# Patient Record
Sex: Female | Born: 1945
Health system: Southern US, Community
[De-identification: ages and names within clinical notes are randomized; demographics above are authoritative.]

## PROBLEM LIST (undated history)

## (undated) DIAGNOSIS — N390 Urinary tract infection, site not specified: Secondary | ICD-10-CM

## (undated) DIAGNOSIS — Z8709 Personal history of other diseases of the respiratory system: Secondary | ICD-10-CM

## (undated) DIAGNOSIS — E785 Hyperlipidemia, unspecified: Secondary | ICD-10-CM

## (undated) DIAGNOSIS — B9689 Other specified bacterial agents as the cause of diseases classified elsewhere: Secondary | ICD-10-CM

## (undated) DIAGNOSIS — Z8669 Personal history of other diseases of the nervous system and sense organs: Secondary | ICD-10-CM

## (undated) DIAGNOSIS — E079 Disorder of thyroid, unspecified: Secondary | ICD-10-CM

## (undated) DIAGNOSIS — K219 Gastro-esophageal reflux disease without esophagitis: Secondary | ICD-10-CM

## (undated) DIAGNOSIS — F0390 Unspecified dementia without behavioral disturbance: Secondary | ICD-10-CM

## (undated) DIAGNOSIS — M545 Low back pain, unspecified: Secondary | ICD-10-CM

## (undated) DIAGNOSIS — K589 Irritable bowel syndrome without diarrhea: Secondary | ICD-10-CM

## (undated) DIAGNOSIS — T7840XA Allergy, unspecified, initial encounter: Secondary | ICD-10-CM

## (undated) DIAGNOSIS — Z87898 Personal history of other specified conditions: Secondary | ICD-10-CM

## (undated) DIAGNOSIS — I10 Essential (primary) hypertension: Secondary | ICD-10-CM

## (undated) DIAGNOSIS — D126 Benign neoplasm of colon, unspecified: Secondary | ICD-10-CM

## (undated) HISTORY — DX: Personal history of other diseases of the nervous system and sense organs: Z86.69

## (undated) HISTORY — PX: GUM SURGERY: SHX658

## (undated) HISTORY — DX: Low back pain: M54.5

## (undated) HISTORY — DX: Gastro-esophageal reflux disease without esophagitis: K21.9

## (undated) HISTORY — DX: Essential (primary) hypertension: I10

## (undated) HISTORY — DX: Hyperlipidemia, unspecified: E78.5

## (undated) HISTORY — DX: Disorder of thyroid, unspecified: E07.9

## (undated) HISTORY — DX: Allergy, unspecified, initial encounter: T78.40XA

## (undated) HISTORY — DX: Unspecified dementia, unspecified severity, without behavioral disturbance, psychotic disturbance, mood disturbance, and anxiety: F03.90

## (undated) HISTORY — DX: Irritable bowel syndrome without diarrhea: K58.9

## (undated) HISTORY — DX: Benign neoplasm of colon, unspecified: D12.6

## (undated) HISTORY — DX: Low back pain, unspecified: M54.50

---

## 1975-09-09 HISTORY — PX: TUBAL LIGATION: SHX77

## 1996-09-08 HISTORY — PX: CHOLECYSTECTOMY: SHX55

## 1996-09-08 HISTORY — PX: ABDOMINAL HYSTERECTOMY: SHX81

## 1999-02-11 ENCOUNTER — Other Ambulatory Visit: Admission: RE | Admit: 1999-02-11 | Discharge: 1999-02-11 | Payer: Self-pay | Admitting: Gynecology

## 2002-07-12 ENCOUNTER — Other Ambulatory Visit: Admission: RE | Admit: 2002-07-12 | Discharge: 2002-07-12 | Payer: Self-pay | Admitting: Gynecology

## 2005-04-24 ENCOUNTER — Ambulatory Visit: Payer: Self-pay | Admitting: Family Medicine

## 2005-04-24 ENCOUNTER — Encounter: Admission: RE | Admit: 2005-04-24 | Discharge: 2005-04-24 | Payer: Self-pay | Admitting: Family Medicine

## 2005-07-02 ENCOUNTER — Ambulatory Visit: Payer: Self-pay | Admitting: Family Medicine

## 2005-07-21 ENCOUNTER — Ambulatory Visit: Payer: Self-pay | Admitting: Family Medicine

## 2005-08-28 ENCOUNTER — Other Ambulatory Visit: Admission: RE | Admit: 2005-08-28 | Discharge: 2005-08-28 | Payer: Self-pay | Admitting: Gynecology

## 2005-12-03 ENCOUNTER — Ambulatory Visit: Payer: Self-pay | Admitting: Family Medicine

## 2007-03-17 ENCOUNTER — Encounter: Payer: Self-pay | Admitting: Family Medicine

## 2007-04-07 ENCOUNTER — Ambulatory Visit: Payer: Self-pay | Admitting: Family Medicine

## 2007-04-07 DIAGNOSIS — E039 Hypothyroidism, unspecified: Secondary | ICD-10-CM | POA: Insufficient documentation

## 2007-04-07 DIAGNOSIS — K219 Gastro-esophageal reflux disease without esophagitis: Secondary | ICD-10-CM | POA: Insufficient documentation

## 2007-04-07 DIAGNOSIS — E785 Hyperlipidemia, unspecified: Secondary | ICD-10-CM

## 2007-04-07 DIAGNOSIS — K589 Irritable bowel syndrome without diarrhea: Secondary | ICD-10-CM | POA: Insufficient documentation

## 2007-04-07 DIAGNOSIS — E7800 Pure hypercholesterolemia, unspecified: Secondary | ICD-10-CM | POA: Insufficient documentation

## 2007-05-05 ENCOUNTER — Ambulatory Visit: Payer: Self-pay | Admitting: Internal Medicine

## 2007-05-05 LAB — CONVERTED CEMR LAB: Tissue Transglutaminase Ab, IgA: 0.1 units (ref <7)

## 2007-05-20 ENCOUNTER — Encounter: Payer: Self-pay | Admitting: Internal Medicine

## 2007-05-20 ENCOUNTER — Ambulatory Visit: Payer: Self-pay | Admitting: Internal Medicine

## 2007-05-20 ENCOUNTER — Encounter: Payer: Self-pay | Admitting: Family Medicine

## 2007-05-20 DIAGNOSIS — D126 Benign neoplasm of colon, unspecified: Secondary | ICD-10-CM | POA: Insufficient documentation

## 2007-05-20 DIAGNOSIS — K5730 Diverticulosis of large intestine without perforation or abscess without bleeding: Secondary | ICD-10-CM | POA: Insufficient documentation

## 2007-05-26 LAB — HM COLONOSCOPY

## 2007-06-11 ENCOUNTER — Ambulatory Visit: Payer: Self-pay | Admitting: Internal Medicine

## 2007-07-14 ENCOUNTER — Ambulatory Visit: Payer: Self-pay | Admitting: Family Medicine

## 2007-07-26 ENCOUNTER — Ambulatory Visit: Payer: Self-pay | Admitting: Family Medicine

## 2007-07-26 DIAGNOSIS — R059 Cough, unspecified: Secondary | ICD-10-CM | POA: Insufficient documentation

## 2007-07-26 DIAGNOSIS — R05 Cough: Secondary | ICD-10-CM

## 2007-07-28 ENCOUNTER — Ambulatory Visit: Payer: Self-pay | Admitting: Family Medicine

## 2007-07-30 ENCOUNTER — Encounter (INDEPENDENT_AMBULATORY_CARE_PROVIDER_SITE_OTHER): Payer: Self-pay | Admitting: *Deleted

## 2007-08-10 ENCOUNTER — Encounter: Payer: Self-pay | Admitting: Family Medicine

## 2007-08-10 ENCOUNTER — Ambulatory Visit: Payer: Self-pay | Admitting: Internal Medicine

## 2007-08-16 ENCOUNTER — Encounter (INDEPENDENT_AMBULATORY_CARE_PROVIDER_SITE_OTHER): Payer: Self-pay | Admitting: *Deleted

## 2007-11-24 ENCOUNTER — Ambulatory Visit: Payer: Self-pay | Admitting: Internal Medicine

## 2008-01-25 ENCOUNTER — Ambulatory Visit: Payer: Self-pay | Admitting: Family Medicine

## 2008-01-25 ENCOUNTER — Telehealth (INDEPENDENT_AMBULATORY_CARE_PROVIDER_SITE_OTHER): Payer: Self-pay | Admitting: *Deleted

## 2008-02-04 ENCOUNTER — Encounter (INDEPENDENT_AMBULATORY_CARE_PROVIDER_SITE_OTHER): Payer: Self-pay | Admitting: *Deleted

## 2008-02-10 ENCOUNTER — Encounter (INDEPENDENT_AMBULATORY_CARE_PROVIDER_SITE_OTHER): Payer: Self-pay | Admitting: *Deleted

## 2008-02-29 ENCOUNTER — Encounter (INDEPENDENT_AMBULATORY_CARE_PROVIDER_SITE_OTHER): Payer: Self-pay | Admitting: *Deleted

## 2008-02-29 ENCOUNTER — Ambulatory Visit: Payer: Self-pay | Admitting: Family Medicine

## 2008-02-29 DIAGNOSIS — IMO0001 Reserved for inherently not codable concepts without codable children: Secondary | ICD-10-CM | POA: Insufficient documentation

## 2008-02-29 LAB — CONVERTED CEMR LAB: Total CK: 129 units/L (ref 7–177)

## 2008-03-24 ENCOUNTER — Ambulatory Visit: Payer: Self-pay | Admitting: Family Medicine

## 2008-03-24 DIAGNOSIS — J0110 Acute frontal sinusitis, unspecified: Secondary | ICD-10-CM | POA: Insufficient documentation

## 2008-03-27 ENCOUNTER — Encounter (INDEPENDENT_AMBULATORY_CARE_PROVIDER_SITE_OTHER): Payer: Self-pay | Admitting: *Deleted

## 2008-04-18 ENCOUNTER — Encounter: Payer: Self-pay | Admitting: Family Medicine

## 2008-04-18 ENCOUNTER — Ambulatory Visit: Payer: Self-pay | Admitting: Internal Medicine

## 2008-04-19 ENCOUNTER — Telehealth (INDEPENDENT_AMBULATORY_CARE_PROVIDER_SITE_OTHER): Payer: Self-pay | Admitting: *Deleted

## 2008-06-30 ENCOUNTER — Ambulatory Visit: Payer: Self-pay | Admitting: Family Medicine

## 2008-07-24 ENCOUNTER — Ambulatory Visit: Payer: Self-pay | Admitting: Family Medicine

## 2008-07-24 DIAGNOSIS — H409 Unspecified glaucoma: Secondary | ICD-10-CM | POA: Insufficient documentation

## 2008-07-24 DIAGNOSIS — I10 Essential (primary) hypertension: Secondary | ICD-10-CM | POA: Insufficient documentation

## 2008-07-25 ENCOUNTER — Encounter (INDEPENDENT_AMBULATORY_CARE_PROVIDER_SITE_OTHER): Payer: Self-pay | Admitting: *Deleted

## 2008-08-04 ENCOUNTER — Encounter (INDEPENDENT_AMBULATORY_CARE_PROVIDER_SITE_OTHER): Payer: Self-pay | Admitting: *Deleted

## 2008-08-04 ENCOUNTER — Ambulatory Visit: Payer: Self-pay | Admitting: Family Medicine

## 2008-08-04 LAB — CONVERTED CEMR LAB: OCCULT 1: NEGATIVE

## 2008-08-14 ENCOUNTER — Ambulatory Visit: Payer: Self-pay | Admitting: Family Medicine

## 2008-08-14 DIAGNOSIS — B351 Tinea unguium: Secondary | ICD-10-CM | POA: Insufficient documentation

## 2008-08-18 ENCOUNTER — Encounter (INDEPENDENT_AMBULATORY_CARE_PROVIDER_SITE_OTHER): Payer: Self-pay | Admitting: *Deleted

## 2008-09-13 ENCOUNTER — Encounter: Payer: Self-pay | Admitting: Family Medicine

## 2008-09-18 ENCOUNTER — Ambulatory Visit: Payer: Self-pay | Admitting: Internal Medicine

## 2008-09-18 DIAGNOSIS — R141 Gas pain: Secondary | ICD-10-CM | POA: Insufficient documentation

## 2008-09-18 DIAGNOSIS — R143 Flatulence: Secondary | ICD-10-CM

## 2008-09-18 DIAGNOSIS — R142 Eructation: Secondary | ICD-10-CM

## 2008-10-02 ENCOUNTER — Encounter: Payer: Self-pay | Admitting: Internal Medicine

## 2008-10-02 ENCOUNTER — Ambulatory Visit: Payer: Self-pay | Admitting: Internal Medicine

## 2008-10-03 ENCOUNTER — Encounter: Payer: Self-pay | Admitting: Internal Medicine

## 2008-11-14 ENCOUNTER — Ambulatory Visit: Payer: Self-pay | Admitting: Family Medicine

## 2008-11-28 ENCOUNTER — Ambulatory Visit: Payer: Self-pay | Admitting: Family Medicine

## 2008-12-04 ENCOUNTER — Ambulatory Visit: Payer: Self-pay | Admitting: Pulmonary Disease

## 2008-12-20 ENCOUNTER — Ambulatory Visit: Payer: Self-pay | Admitting: Pulmonary Disease

## 2009-01-17 ENCOUNTER — Ambulatory Visit: Payer: Self-pay | Admitting: Pulmonary Disease

## 2009-01-17 DIAGNOSIS — J019 Acute sinusitis, unspecified: Secondary | ICD-10-CM | POA: Insufficient documentation

## 2009-01-23 ENCOUNTER — Ambulatory Visit: Payer: Self-pay | Admitting: Family Medicine

## 2009-01-30 ENCOUNTER — Encounter (INDEPENDENT_AMBULATORY_CARE_PROVIDER_SITE_OTHER): Payer: Self-pay | Admitting: *Deleted

## 2009-01-30 LAB — CONVERTED CEMR LAB
ALT: 19 units/L (ref 0–35)
AST: 18 units/L (ref 0–37)
Albumin: 3.7 g/dL (ref 3.5–5.2)
BUN: 14 mg/dL (ref 6–23)
Chloride: 106 meq/L (ref 96–112)
Cholesterol: 172 mg/dL (ref 0–200)
Glucose, Bld: 82 mg/dL (ref 70–99)
Potassium: 4.7 meq/L (ref 3.5–5.1)

## 2009-02-02 ENCOUNTER — Telehealth (INDEPENDENT_AMBULATORY_CARE_PROVIDER_SITE_OTHER): Payer: Self-pay | Admitting: *Deleted

## 2009-02-20 ENCOUNTER — Ambulatory Visit: Payer: Self-pay | Admitting: Family Medicine

## 2009-03-02 ENCOUNTER — Ambulatory Visit: Payer: Self-pay | Admitting: Pulmonary Disease

## 2009-03-19 ENCOUNTER — Telehealth (INDEPENDENT_AMBULATORY_CARE_PROVIDER_SITE_OTHER): Payer: Self-pay | Admitting: *Deleted

## 2009-07-26 ENCOUNTER — Ambulatory Visit: Payer: Self-pay | Admitting: Family Medicine

## 2009-07-26 DIAGNOSIS — Z780 Asymptomatic menopausal state: Secondary | ICD-10-CM | POA: Insufficient documentation

## 2009-08-01 ENCOUNTER — Encounter (INDEPENDENT_AMBULATORY_CARE_PROVIDER_SITE_OTHER): Payer: Self-pay | Admitting: *Deleted

## 2009-08-13 ENCOUNTER — Encounter: Payer: Self-pay | Admitting: Family Medicine

## 2009-08-13 ENCOUNTER — Ambulatory Visit: Payer: Self-pay | Admitting: Internal Medicine

## 2009-08-22 ENCOUNTER — Ambulatory Visit: Payer: Self-pay | Admitting: Family Medicine

## 2009-08-24 ENCOUNTER — Encounter (INDEPENDENT_AMBULATORY_CARE_PROVIDER_SITE_OTHER): Payer: Self-pay | Admitting: *Deleted

## 2009-08-24 LAB — CONVERTED CEMR LAB
OCCULT 1: NEGATIVE
OCCULT 2: NEGATIVE
OCCULT 3: NEGATIVE

## 2009-09-03 ENCOUNTER — Encounter (INDEPENDENT_AMBULATORY_CARE_PROVIDER_SITE_OTHER): Payer: Self-pay | Admitting: *Deleted

## 2009-11-22 ENCOUNTER — Telehealth: Payer: Self-pay | Admitting: Family Medicine

## 2010-01-21 ENCOUNTER — Telehealth (INDEPENDENT_AMBULATORY_CARE_PROVIDER_SITE_OTHER): Payer: Self-pay | Admitting: *Deleted

## 2010-01-28 ENCOUNTER — Ambulatory Visit: Payer: Self-pay | Admitting: Family Medicine

## 2010-01-28 LAB — CONVERTED CEMR LAB
ALT: 20 units/L (ref 0–35)
AST: 22 units/L (ref 0–37)
Alkaline Phosphatase: 70 units/L (ref 39–117)
Bilirubin, Direct: 0.1 mg/dL (ref 0.0–0.3)
Cholesterol: 159 mg/dL (ref 0–200)
Total Bilirubin: 0.7 mg/dL (ref 0.3–1.2)
Total Protein: 7.3 g/dL (ref 6.0–8.3)

## 2010-02-20 LAB — HM PAP SMEAR: HM Pap smear: NORMAL

## 2010-02-20 LAB — HM MAMMOGRAPHY: HM Mammogram: NORMAL

## 2010-09-12 ENCOUNTER — Encounter: Payer: Self-pay | Admitting: Family Medicine

## 2010-09-12 ENCOUNTER — Ambulatory Visit
Admission: RE | Admit: 2010-09-12 | Discharge: 2010-09-12 | Payer: Self-pay | Source: Home / Self Care | Attending: Family Medicine | Admitting: Family Medicine

## 2010-09-12 ENCOUNTER — Other Ambulatory Visit: Payer: Self-pay | Admitting: Family Medicine

## 2010-09-12 LAB — CBC WITH DIFFERENTIAL/PLATELET
Basophils Absolute: 0 10*3/uL (ref 0.0–0.1)
Basophils Relative: 0.8 % (ref 0.0–3.0)
Eosinophils Absolute: 0.2 10*3/uL (ref 0.0–0.7)
Eosinophils Relative: 2.5 % (ref 0.0–5.0)
HCT: 36.9 % (ref 36.0–46.0)
Hemoglobin: 12.4 g/dL (ref 12.0–15.0)
Lymphocytes Relative: 27.3 % (ref 12.0–46.0)
Lymphs Abs: 1.7 10*3/uL (ref 0.7–4.0)
MCHC: 33.6 g/dL (ref 30.0–36.0)
MCV: 87 fl (ref 78.0–100.0)
Monocytes Absolute: 0.5 10*3/uL (ref 0.1–1.0)
Monocytes Relative: 8.4 % (ref 3.0–12.0)
Neutro Abs: 3.8 10*3/uL (ref 1.4–7.7)
Neutrophils Relative %: 61 % (ref 43.0–77.0)
Platelets: 257 10*3/uL (ref 150.0–400.0)
RBC: 4.24 Mil/uL (ref 3.87–5.11)
RDW: 12.6 % (ref 11.5–14.6)
WBC: 6.3 10*3/uL (ref 4.5–10.5)

## 2010-09-12 LAB — CONVERTED CEMR LAB
Ketones, urine, test strip: NEGATIVE
Nitrite: NEGATIVE
Specific Gravity, Urine: <1.005
Urobilinogen, UA: NEGATIVE
WBC Urine, dipstick: NEGATIVE

## 2010-09-12 LAB — HEPATIC FUNCTION PANEL
ALT: 21 U/L (ref 0–35)
AST: 28 U/L (ref 0–37)
Albumin: 4.1 g/dL (ref 3.5–5.2)
Alkaline Phosphatase: 70 U/L (ref 39–117)
Bilirubin, Direct: 0.1 mg/dL (ref 0.0–0.3)
Total Bilirubin: 0.5 mg/dL (ref 0.3–1.2)
Total Protein: 6.9 g/dL (ref 6.0–8.3)

## 2010-09-12 LAB — BASIC METABOLIC PANEL
BUN: 13 mg/dL (ref 6–23)
CO2: 31 mEq/L (ref 19–32)
Calcium: 9.3 mg/dL (ref 8.4–10.5)
Chloride: 105 mEq/L (ref 96–112)
Creatinine, Ser: 0.7 mg/dL (ref 0.4–1.2)
GFR: 97.36 mL/min (ref >60.00)
Glucose, Bld: 91 mg/dL (ref 70–99)
Potassium: 5.2 mEq/L — ABNORMAL HIGH (ref 3.5–5.1)
Sodium: 143 mEq/L (ref 135–145)

## 2010-09-12 LAB — TSH: TSH: 2.5 u[IU]/mL (ref 0.35–5.50)

## 2010-09-12 LAB — LIPID PANEL
Cholesterol: 155 mg/dL (ref 0–200)
HDL: 58.3 mg/dL (ref >39.00)
LDL Cholesterol: 82 mg/dL (ref 0–99)
Total CHOL/HDL Ratio: 3
Triglycerides: 76 mg/dL (ref 0.0–149.0)
VLDL: 15.2 mg/dL (ref 0.0–40.0)

## 2010-10-06 LAB — CONVERTED CEMR LAB
ALT: 15 units/L (ref 0–35)
ALT: 18 units/L (ref 0–35)
ALT: 20 units/L (ref 0–35)
ALT: 21 units/L (ref 0–35)
AST: 19 units/L (ref 0–37)
AST: 22 units/L (ref 0–37)
AST: 22 units/L (ref 0–37)
Albumin: 3.9 g/dL (ref 3.5–5.2)
Albumin: 3.9 g/dL (ref 3.5–5.2)
Albumin: 3.9 g/dL (ref 3.5–5.2)
Albumin: 4.2 g/dL (ref 3.5–5.2)
Alkaline Phosphatase: 62 units/L (ref 39–117)
Alkaline Phosphatase: 62 units/L (ref 39–117)
Alkaline Phosphatase: 65 units/L (ref 39–117)
Alkaline Phosphatase: 83 units/L (ref 39–117)
Alkaline Phosphatase: 95 units/L (ref 39–117)
BUN: 11 mg/dL (ref 6–23)
BUN: 13 mg/dL (ref 6–23)
Basophils Absolute: 0 10*3/uL (ref 0.0–0.1)
Basophils Absolute: 0.1 10*3/uL (ref 0.0–0.1)
Basophils Relative: 0.8 % (ref 0.0–1.0)
Bilirubin Urine: NEGATIVE
Bilirubin, Direct: 0.1 mg/dL (ref 0.0–0.3)
Bilirubin, Direct: 0.1 mg/dL (ref 0.0–0.3)
Blood in Urine, dipstick: NEGATIVE
CO2: 29 meq/L (ref 19–32)
CO2: 30 meq/L (ref 19–32)
CO2: 31 meq/L (ref 19–32)
Calcium: 8.9 mg/dL (ref 8.4–10.5)
Calcium: 9.3 mg/dL (ref 8.4–10.5)
Calcium: 9.5 mg/dL (ref 8.4–10.5)
Chloride: 104 meq/L (ref 96–112)
Creatinine, Ser: 0.8 mg/dL (ref 0.4–1.2)
Eosinophils Absolute: 0.2 10*3/uL (ref 0.0–0.7)
Eosinophils Absolute: 0.3 10*3/uL (ref 0.0–0.6)
Eosinophils Relative: 3.4 % (ref 0.0–5.0)
Eosinophils Relative: 3.9 % (ref 0.0–5.0)
GFR calc Af Amer: 94 mL/min
GFR calc non Af Amer: 78 mL/min
Glucose, Bld: 81 mg/dL (ref 70–99)
Glucose, Bld: 82 mg/dL (ref 70–99)
Glucose, Bld: 95 mg/dL (ref 70–99)
Glucose, Urine, Semiquant: NEGATIVE
HCT: 36.9 % (ref 36.0–46.0)
HCT: 37.1 % (ref 36.0–46.0)
HDL: 60.4 mg/dL (ref >39.00)
Hemoglobin: 12.5 g/dL (ref 12.0–15.0)
Hemoglobin: 12.6 g/dL (ref 12.0–15.0)
Hemoglobin: 12.9 g/dL (ref 12.0–15.0)
Ketones, urine, test strip: NEGATIVE
LDL Cholesterol: 79 mg/dL (ref 0–99)
Lymphocytes Relative: 23.3 % (ref 12.0–46.0)
Lymphocytes Relative: 23.9 % (ref 12.0–46.0)
Lymphocytes Relative: 23.9 % (ref 12.0–46.0)
Lymphs Abs: 1.6 10*3/uL (ref 0.7–4.0)
MCHC: 34.3 g/dL (ref 30.0–36.0)
MCHC: 34.7 g/dL (ref 30.0–36.0)
MCV: 83.1 fL (ref 78.0–100.0)
Monocytes Absolute: 0.5 10*3/uL (ref 0.1–1.0)
Monocytes Absolute: 0.6 10*3/uL (ref 0.2–0.7)
Monocytes Relative: 7.9 % (ref 3.0–11.0)
Monocytes Relative: 9 % (ref 3.0–12.0)
Neutro Abs: 4.3 10*3/uL (ref 1.4–7.7)
Neutro Abs: 4.4 10*3/uL (ref 1.4–7.7)
Neutro Abs: 5.1 10*3/uL (ref 1.4–7.7)
Neutrophils Relative %: 64.1 % (ref 43.0–77.0)
Pap Smear: NORMAL
Platelets: 307 10*3/uL (ref 150–400)
Potassium: 4.7 meq/L (ref 3.5–5.1)
Protein, U semiquant: 30
RBC: 4.34 M/uL (ref 3.87–5.11)
RBC: 4.46 M/uL (ref 3.87–5.11)
RDW: 11.7 % (ref 11.5–14.6)
RDW: 12 % (ref 11.5–14.6)
RDW: 12.5 % (ref 11.5–14.6)
Sodium: 140 meq/L (ref 135–145)
Sodium: 141 meq/L (ref 135–145)
Sodium: 145 meq/L (ref 135–145)
TSH: 2.71 microintl units/mL (ref 0.35–5.50)
TSH: 3.31 microintl units/mL (ref 0.35–5.50)
Total Bilirubin: 0.8 mg/dL (ref 0.3–1.2)
Total CHOL/HDL Ratio: 2.9
Total Protein: 6.9 g/dL (ref 6.0–8.3)
Total Protein: 7.1 g/dL (ref 6.0–8.3)
Total Protein: 7.3 g/dL (ref 6.0–8.3)
Triglycerides: 69 mg/dL (ref 0–149)
Triglycerides: 82 mg/dL (ref 0.0–149.0)
WBC: 7.1 10*3/uL (ref 4.5–10.5)
WBC: 8 10*3/uL (ref 4.5–10.5)
pH: 7.5

## 2010-10-10 NOTE — Progress Notes (Signed)
Summary: refill  Phone Note Refill Request Message from:  Fax from Pharmacy on Jan 21, 2010 8:44 AM  Refills Requested: Medication #1:  TUSSICAPS 10-8 MG XR12H-CAP one every 12hrs if needed [facility] [address] - fax [phone] - ph [phone]    Method Requested: Fax to Local Pharmacy Next Appointment Scheduled: None Initial call taken by: Okey Regal [place],  Jan 21, 2010 8:48 AM  Follow-up for Phone Call        Pt is aware we do not refill unless needed, she states she does not need at the moment, she was using when she goes out of town incase she starts coughing. Army Fossa CMA  Jan 21, 2010 8:57 AM

## 2010-10-10 NOTE — Assessment & Plan Note (Signed)
Summary: CPX AND FASTING LABS//PH   Vital Signs:  Patient profile:   65 year old female Height:      62.75 inches Weight:      162.0 pounds BMI:     29.03 Pulse rate:   72 / minute Pulse rhythm:   regular BP sitting:   116 / 70  (right arm) Cuff size:   regular  Vitals Entered By: Almeta Monas CMA Duncan Dull) (September 12, 2010 8:35 AM) CC: CPX/Fasting--no pap. wants a refill on the Tussicaps   History of Present Illness: Pt here for cpe , no pap and labs.   Dr Laural Roes --gyn Dr Davina Poke Dr [place]--GI Dr Oliver Hum Pt would like refills on cough meds to do cough protochol again---she will f/u Dr clance if there is no improvement.  Preventive Screening-Counseling & Management  Alcohol-Tobacco     Alcohol drinks/day: 0     Smoking Status: never     Passive Smoke Exposure: no  Caffeine-Diet-Exercise     Caffeine use/day: 3     Does Patient Exercise: no     Exercise Counseling: to improve exercise regimen  Hep-HIV-STD-Contraception     HIV Risk: no     Dental Visit-last 6 months yes     Dental Care Counseling: not indicated; dental care within six months     SBE monthly: yes     SBE Education/Counseling: not indicated; SBE done regularly     Sun Exposure-Excessive: occasionally  Safety-Violence-Falls     Seat Belt Use: yes      Sexual History:  currently monogamous and married.        Drug Use:  never.    Problems Prior to Update: 1)  Postmenopausal Status  (ICD-V49.81) 2)  Preventive Health Care  (ICD-V70.0) 3)  Sinusitis, Acute  (ICD-461.9) 4)  Cough  (ICD-786.2) 5)  Intestinal Gas  (ICD-787.3) 6)  Onychomycosis, Toenails  (ICD-110.1) 7)  Glaucoma  (ICD-365.9) 8)  Hypertension  (ICD-401.9) 9)  Acute Frontal Sinusitis  (ICD-461.1) 10)  Myalgia  (ICD-729.1) 11)  Diverticulosis, Colon  (ICD-562.10) 12)  Adenomatous Colonic Polyp  (ICD-211.3) 13)  Cough  (ICD-786.2) 14)  Ibs  (ICD-564.1) 15)  Preventive Health Care  (ICD-V70.0) 16)  Hypothyroidism   (ICD-244.9) 17)  Hyperlipidemia  (ICD-272.4) 18)  Gerd  (ICD-530.81)  Medications Prior to Update: 1)  Nexium 40 Mg  Cpdr (Esomeprazole Magnesium) .Marland Kitchen.. 1 By Mouth Once Daily 2)  Synthroid 50 Mcg  Tabs (Levothyroxine Sodium) .Marland Kitchen.. 1 Once Daily 3)  Premarin 0.45 Mg  Tabs (Estrogens Conjugated) .Marland Kitchen.. 1 Once Daily 4)  Adult Aspirin Low Strength 81 Mg  Tbdp (Aspirin) .Marland Kitchen.. 1 Once Daily 5)  Calcium 600mg  .... Two Times A Day 6)  Mvi .Marland Kitchen.. 1 Once Daily 7)  Align   Caps (Misc Intestinal Flora Regulat) .Marland Kitchen.. 1 By Mouth Once Daily 8)  Pravachol 40 Mg  Tabs (Pravastatin Sodium) .Marland Kitchen.. 1 By Mouth Once Daily** Labs Due Now** 9)  Mobic 15 Mg Tabs (Meloxicam) .... Take 1 Tab Once Daily Prn 10)  Norvasc 5 Mg Tabs (Amlodipine Besylate) .Marland Kitchen.. 1 By Mouth Once Daily 11)  Zyrtec Allergy 10 Mg Tabs (Cetirizine Hcl) .Marland Kitchen.. 1 Once Daily As Needed 12)  Tussicaps 10-8 Mg Xr12h-Cap (Hydrocod Polst-Chlorphen Polst) .... One Every 12hrs If Needed 13)  Tessalon Perles 100 Mg  Caps (Benzonatate) .... Two By Mouth 3-4 Times Daily If Needed.  Current Medications (verified): 1)  Nexium 40 Mg  Cpdr (Esomeprazole Magnesium) .Marland Kitchen.. 1 By Mouth Once Daily  2)  Synthroid 50 Mcg  Tabs (Levothyroxine Sodium) .Marland Kitchen.. 1 Once Daily 3)  Adult Aspirin Low Strength 81 Mg  Tbdp (Aspirin) .Marland Kitchen.. 1 Once Daily 4)  Calcium 600mg  .... Two Times A Day 5)  Mvi .Marland Kitchen.. 1 Once Daily 6)  Align   Caps (Misc Intestinal Flora Regulat) .Marland Kitchen.. 1 By Mouth Once Daily 7)  Pravachol 40 Mg  Tabs (Pravastatin Sodium) .Marland Kitchen.. 1 By Mouth At Bedtime 8)  Mobic 15 Mg Tabs (Meloxicam) .... Take 1 Tab Once Daily Prn 9)  Norvasc 5 Mg Tabs (Amlodipine Besylate) .Marland Kitchen.. 1 By Mouth Once Daily 10)  Zyrtec Allergy 10 Mg Tabs (Cetirizine Hcl) .Marland Kitchen.. 1 Once Daily As Needed 11)  Tussicaps 10-8 Mg Xr12h-Cap (Hydrocod Polst-Chlorphen Polst) .... One Every 12hrs If Needed 12)  Tessalon Perles 100 Mg Caps (Benzonatate) .... 2 By Mouth 3-4 X A Day As Needed Cough  Allergies (verified): No Known Drug  Allergies  Past History:  Past Medical History: Last updated: 07/24/2008 GERD Hyperlipidemia Hypothyroidism IBS Adenomatous colon polyp Incidental diverticulosis Hypertension Current Problems:  GLAUCOMA (ICD-365.9) HYPERTENSION (ICD-401.9) ACUTE FRONTAL SINUSITIS (ICD-461.1) MYALGIA (ICD-729.1) DIVERTICULOSIS, COLON (ICD-562.10) ADENOMATOUS COLONIC POLYP (ICD-211.3) COUGH (ICD-786.2) IBS (ICD-564.1) PREVENTIVE HEALTH CARE (ICD-V70.0) HYPOTHYROIDISM (ICD-244.9) HYPERLIPIDEMIA (ICD-272.4) GERD (ICD-530.81)  Past Surgical History: Last updated: 07/24/2008 Tubal ligation (1610) Cholecystectomy (1998) Hysterectomy (1998) gum surgery 1996 2 childbirths  Family History: Last updated: 12/04/2008 Family History Hypertension F- mac degen m- glaucoma, ? emphysema Family History of Prostate CA 1st degree relative <50- father  1 b-- brain tumor  Social History: Last updated: 12/04/2008 retired. previously worked in Physiological scientist.   Married with children. Never Smoked Alcohol use-no Drug use-no Regular exercise-no  Risk Factors: Alcohol Use: 0 (09/12/2010) Caffeine Use: 3 (09/12/2010) Exercise: no (09/12/2010)  Risk Factors: Smoking Status: never (09/12/2010) Passive Smoke Exposure: no (09/12/2010)  Family History: Reviewed history from 12/04/2008 and no changes required. Family History Hypertension F- mac degen m- glaucoma, ? emphysema Family History of Prostate CA 1st degree relative <50- father  1 b-- brain tumor  Social History: Reviewed history from 12/04/2008 and no changes required. retired. previously worked in Physiological scientist.   Married with children. Never Smoked Alcohol use-no Drug use-no Regular exercise-no  Caffeine use/day:  3  Review of Systems      See HPI General:  Denies chills, fatigue, fever, loss of appetite, malaise, sleep disorder, sweats, weakness, and weight loss. Eyes:  Denies blurring, discharge, double vision, eye irritation, eye  pain, halos, itching, light sensitivity, red eye, vision loss-1 eye, and vision loss-both eyes. ENT:  Denies decreased hearing, difficulty swallowing, ear discharge, earache, hoarseness, nasal congestion, nosebleeds, postnasal drainage, ringing in ears, sinus pressure, and sore throat. CV:  Denies bluish discoloration of lips or nails, chest pain or discomfort, difficulty breathing at night, difficulty breathing while lying down, fainting, fatigue, leg cramps with exertion, lightheadness, near fainting, palpitations, shortness of breath with exertion, swelling of feet, swelling of hands, and weight gain. Resp:  Denies chest discomfort, chest pain with inspiration, cough, coughing up blood, excessive snoring, hypersomnolence, morning headaches, pleuritic, shortness of breath, sputum productive, and wheezing. GI:  Denies abdominal pain, bloody stools, change in bowel habits, constipation, dark tarry stools, diarrhea, excessive appetite, gas, hemorrhoids, indigestion, loss of appetite, nausea, vomiting, vomiting blood, and yellowish skin color. GU:  Denies abnormal vaginal bleeding, decreased libido, discharge, dysuria, genital sores, hematuria, incontinence, nocturia, urinary frequency, and urinary hesitancy. MS:  Denies joint pain, joint redness, joint swelling, loss of strength, low back pain,  mid back pain, muscle aches, muscle , cramps, muscle weakness, stiffness, and thoracic pain. Derm:  Denies changes in color of skin, changes in nail beds, dryness, excessive perspiration, flushing, hair loss, insect bite(s), itching, lesion(s), poor wound healing, and rash. Neuro:  Denies brief paralysis, difficulty with concentration, disturbances in coordination, falling down, headaches, inability to speak, memory loss, numbness, poor balance, seizures, sensation of room spinning, tingling, tremors, visual disturbances, and weakness. Psych:  Denies alternate hallucination ( auditory/visual), anxiety, depression,  easily angered, easily tearful, irritability, mental problems, panic attacks, sense of great danger, suicidal thoughts/plans, thoughts of violence, unusual visions or sounds, and thoughts /plans of harming others. Endo:  Denies cold intolerance, excessive hunger, excessive thirst, excessive urination, heat intolerance, polyuria, and weight change. Heme:  Denies abnormal bruising, bleeding, enlarge lymph nodes, fevers, pallor, and skin discoloration. Allergy:  Denies hives or rash, itching eyes, persistent infections, seasonal allergies, and sneezing.  Physical Exam  General:  Well-developed,well-nourished,in no acute distress; alert,appropriate and cooperative throughout examination Head:  Normocephalic and atraumatic without obvious abnormalities. No apparent alopecia or balding. Eyes:  pupils equal, pupils round, pupils reactive to light, and no injection.   Ears:  External ear exam shows no significant lesions or deformities.  Otoscopic examination reveals clear canals, tympanic membranes are intact bilaterally without bulging, retraction, inflammation or discharge. Hearing is grossly normal bilaterally. Nose:  External nasal examination shows no deformity or inflammation. Nasal mucosa are pink and moist without lesions or exudates. Mouth:  Oral mucosa and oropharynx without lesions or exudates.  Teeth in good repair. Neck:  No deformities, masses, or tenderness noted. Chest Wall:  No deformities, masses, or tenderness noted. Breasts:  gyn Lungs:  Normal respiratory effort, chest expands symmetrically. Lungs are clear to auscultation, no crackles or wheezes. Heart:  normal rate and no murmur.   Abdomen:  Bowel sounds positive,abdomen soft and non-tender without masses, organomegaly or hernias noted. Rectal:  gyn Genitalia:  gyn Msk:  normal ROM, no joint tenderness, no joint swelling, no joint warmth, no redness over joints, no joint deformities, no joint instability, and no crepitation.     Pulses:  R posterior tibial normal, R dorsalis pedis normal, R carotid normal, L posterior tibial normal, L dorsalis pedis normal, and L carotid normal.   Extremities:  No clubbing, cyanosis, edema, or deformity noted with normal full range of motion of all joints.   Neurologic:  No cranial nerve deficits noted. Station and gait are normal. Plantar reflexes are down-going bilaterally. DTRs are symmetrical throughout. Sensory, motor and coordinative functions appear intact. Skin:  Intact without suspicious lesions or rashes Cervical Nodes:  No lymphadenopathy noted Axillary Nodes:  No palpable lymphadenopathy Psych:  Cognition and judgment appear intact. Alert and cooperative with normal attention span and concentration. No apparent delusions, illusions, hallucinations   Impression & Recommendations:  Problem # 1:  PREVENTIVE HEALTH CARE (ICD-V70.0)  Orders: Venipuncture (04540) TLB-Lipid Panel (80061-LIPID) TLB-BMP (Basic Metabolic Panel-BMET) (80048-METABOL) TLB-CBC Platelet - w/Differential (85025-CBCD) TLB-Hepatic/Liver Function Pnl (80076-HEPATIC) TLB-TSH (Thyroid Stimulating Hormone) (84443-TSH) T-Vitamin D (25-Hydroxy) (98119-14782) Specimen Handling (95621) UA Dipstick W/ Micro (manual) (81000) EKG w/ Interpretation (93000)  Problem # 2:  POSTMENOPAUSAL STATUS (ICD-V49.81)  Orders: Venipuncture (30865) TLB-Lipid Panel (80061-LIPID) TLB-BMP (Basic Metabolic Panel-BMET) (80048-METABOL) TLB-CBC Platelet - w/Differential (85025-CBCD) TLB-Hepatic/Liver Function Pnl (80076-HEPATIC) TLB-TSH (Thyroid Stimulating Hormone) (84443-TSH) T-Vitamin D (25-Hydroxy) (78469-62952) Specimen Handling (84132)  Problem # 3:  HYPERTENSION (ICD-401.9)  Her updated medication list for this problem includes:    Norvasc 5 Mg  Tabs (Amlodipine besylate) .Marland Kitchen... 1 by mouth once daily  Orders: Venipuncture (98119) TLB-Lipid Panel (80061-LIPID) TLB-BMP (Basic Metabolic Panel-BMET)  (80048-METABOL) TLB-CBC Platelet - w/Differential (85025-CBCD) TLB-Hepatic/Liver Function Pnl (80076-HEPATIC) TLB-TSH (Thyroid Stimulating Hormone) (84443-TSH) T-Vitamin D (25-Hydroxy) (14782-95621) Specimen Handling (30865)  BP today: 116/70 Prior BP: 110/80 (07/26/2009)  Labs Reviewed: K+: 4.6 (07/26/2009) Creat: : 0.7 (07/26/2009)   Chol: 159 (01/28/2010)   HDL: 64.10 (01/28/2010)   LDL: 79 (01/28/2010)   TG: 79.0 (01/28/2010)  Problem # 4:  HYPOTHYROIDISM (ICD-244.9)  Her updated medication list for this problem includes:    Synthroid 50 Mcg Tabs (Levothyroxine sodium) .Marland Kitchen... 1 once daily  Orders: Venipuncture (78469) TLB-Lipid Panel (80061-LIPID) TLB-BMP (Basic Metabolic Panel-BMET) (80048-METABOL) TLB-CBC Platelet - w/Differential (85025-CBCD) TLB-Hepatic/Liver Function Pnl (80076-HEPATIC) TLB-TSH (Thyroid Stimulating Hormone) (84443-TSH) T-Vitamin D (25-Hydroxy) (62952-84132) Specimen Handling (44010)  Labs Reviewed: TSH: 2.71 (07/26/2009)    Chol: 159 (01/28/2010)   HDL: 64.10 (01/28/2010)   LDL: 79 (01/28/2010)   TG: 79.0 (01/28/2010)  Problem # 5:  HYPERLIPIDEMIA (ICD-272.4)  Her updated medication list for this problem includes:    Pravachol 40 Mg Tabs (Pravastatin sodium) .Marland Kitchen... 1 by mouth at bedtime  Orders: Venipuncture (27253) TLB-Lipid Panel (80061-LIPID) TLB-BMP (Basic Metabolic Panel-BMET) (80048-METABOL) TLB-CBC Platelet - w/Differential (85025-CBCD) TLB-Hepatic/Liver Function Pnl (80076-HEPATIC) TLB-TSH (Thyroid Stimulating Hormone) (84443-TSH) T-Vitamin D (25-Hydroxy) (66440-34742) Specimen Handling (59563)  Labs Reviewed: SGOT: 22 (01/28/2010)   SGPT: 20 (01/28/2010)   HDL:64.10 (01/28/2010), 60.40 (07/26/2009)  LDL:79 (01/28/2010), 74 (07/26/2009)  Chol:159 (01/28/2010), 151 (07/26/2009)  Trig:79.0 (01/28/2010), 82.0 (07/26/2009)  Problem # 6:  GERD (ICD-530.81)  Her updated medication list for this problem includes:    Nexium 40 Mg Cpdr  (Esomeprazole magnesium) .Marland Kitchen... 1 by mouth once daily  Diagnostics Reviewed:  EGD: Location: [facility]   (10/02/2008) Discussed lifestyle modifications, diet, antacids/medications, and preventive measures. Handout provided.   Problem # 7:  COUGH (ICD-786.2)  Complete Medication List: 1)  Nexium 40 Mg Cpdr (Esomeprazole magnesium) .Marland Kitchen.. 1 by mouth once daily 2)  Synthroid 50 Mcg Tabs (Levothyroxine sodium) .Marland Kitchen.. 1 once daily 3)  Adult Aspirin Low Strength 81 Mg Tbdp (Aspirin) .Marland Kitchen.. 1 once daily 4)  Calcium 600mg   .... Two times a day 5)  Mvi  .Marland Kitchen.. 1 once daily 6)  Align Caps (Misc intestinal flora regulat) .Marland Kitchen.. 1 by mouth once daily 7)  Pravachol 40 Mg Tabs (Pravastatin sodium) .Marland Kitchen.. 1 by mouth at bedtime 8)  Mobic 15 Mg Tabs (Meloxicam) .... Take 1 tab once daily prn 9)  Norvasc 5 Mg Tabs (Amlodipine besylate) .Marland Kitchen.. 1 by mouth once daily 10)  Zyrtec Allergy 10 Mg Tabs (Cetirizine hcl) .Marland Kitchen.. 1 once daily as needed 11)  Tussicaps 10-8 Mg Xr12h-cap (Hydrocod polst-chlorphen polst) .... One every 12hrs if needed 12)  Tessalon Perles 100 Mg Caps (Benzonatate) .... 2 by mouth 3-4 x a day as needed cough  Other Orders: Influenza Vaccine NON MCR (87564) Prescriptions: NORVASC 5 MG TABS (AMLODIPINE BESYLATE) 1 by mouth once daily  #90 Tablet x 3   Entered and Authorized by:   Loreen Freud DO   Signed by:   Loreen Freud DO on 09/12/2010   Method used:   Print then Give to Patient   RxID:   [id number] PRAVACHOL 40 MG  TABS (PRAVASTATIN SODIUM) 1 by mouth at bedtime  #30 x 2   Entered and Authorized by:   Loreen Freud DO   Signed by:   Loreen Freud DO on 09/12/2010   Method  used:   Print then Give to Patient   RxID:   [id number] TESSALON PERLES 100 MG CAPS (BENZONATATE) 2 by mouth 3-4 x a day as needed cough  #30 x 0   Entered and Authorized by:   Loreen Freud DO   Signed by:   Loreen Freud DO on 09/12/2010   Method used:   [facility] to        [facility]  [address]  [phone][facility] (retail[facility]       [address]       [place], [place]  [address]       Ph: [phone] or [phone]       Fax: [phone]   RxID:   [id number] TUSSICAPS 10-8 MG XR12H-CAP (HYDROCOD POLST-CHLORPHEN POLST) one every 12hrs if needed  #12 x 0   Entered and Authorized by:   Loreen Freud DO   Signed by:   Loreen Freud DO on 09/12/2010   Method used:   Print then Give to Patient   RxID:   [id number]    Orders Added: 1)  Venipuncture [32355] 2)  TLB-Lipid Panel [80061-LIPID] 3)  TLB-BMP (Basic Metabolic Panel-BMET) [80048-METABOL] 4)  TLB-CBC Platelet - w/Differential [85025-CBCD] 5)  TLB-Hepatic/Liver Function Pnl [80076-HEPATIC] 6)  TLB-TSH (Thyroid Stimulating Hormone) [84443-TSH] 7)  T-Vitamin D (25-Hydroxy) [73220-25427] 8)  Specimen Handling [99000] 9)  Influenza Vaccine NON MCR [00028] 10)  UA Dipstick W/ Micro (manual) [81000] 11)  Est. Patient 40-64 years [99396] 12)  EKG w/ Interpretation [93000]   Immunizations Administered:  Influenza Vaccine # 1:    Vaccine Type: Fluvax Non-MCR    Site: left deltoid    Mfr: Sanofi Pasteur    Dose: 0.5 ml    Route: IM    Given by: Almeta Monas CMA (AAMA)    Exp. Date: 03/08/2011    Lot #: [id number]    VIS given: 04/02/10 version given September 12, 2010.  Flu Vaccine Consent Questions:    Do you have a history of severe allergic reactions to this vaccine? no    Any prior history of allergic reactions to egg and/or gelatin? no    Do you have a sensitivity to the preservative Thimersol? no    Do you have a past history of Guillan-Barre Syndrome? no    Do you currently have an acute febrile illness? no    Have you ever had a severe reaction to latex? no    Vaccine information given and explained to patient? yes    Are you currently pregnant? no   Immunizations Administered:  Influenza Vaccine # 1:    Vaccine Type: Fluvax Non-MCR    Site: left deltoid    Mfr: Sanofi Pasteur    Dose: 0.5 ml     Route: IM    Given by: Almeta Monas CMA (AAMA)    Exp. Date: 03/08/2011    Lot #: [id number]    VIS given: 04/02/10 version given September 12, 2010.  Last Colonoscopy:  Hyperplastic Polyp--- repeat 5 years (05/26/2007 9:30:10 AM) Colonoscopy Result Date:  05/26/2007 Colonoscopy Result:  abnormal Colonoscopy Next Due:  5 yr Last PAP:  normal (01/19/2008 9:52:20 AM) PAP Result Date:  02/20/2010 PAP Result:  normal PAP Next Due:  1 yr Last Mammogram:  normal (01/19/2008 9:52:20 AM) Mammogram Result Date:  02/20/2010 Mammogram Result:  normal Mammogram Next Due:  1 yr  Laboratory Results   Urine Tests   Date/Time Reported: September 12, 2010 10:18 AM   Routine Urinalysis   Color: yellow Appearance: Clear Glucose: negative   (Normal Range: Negative) Bilirubin: negative   (Normal Range: Negative) Ketone: negative   (Normal Range: Negative) Spec. Gravity: <1.005   (Normal Range: 1.003-1.035) Blood: negative   (Normal Range: Negative) pH: 6.5   (Normal Range: 5.0-8.0) Protein: negative   (Normal Range: Negative) Urobilinogen: negative   (Normal Range: 0-1) Nitrite: negative   (Normal Range: Negative) Leukocyte Esterace: negative   (Normal Range: Negative)    Comments: Floydene Flock  September 12, 2010 10:19 AM

## 2010-10-10 NOTE — Progress Notes (Signed)
Summary: REFILL  Phone Note Refill Request Message from:  Fax from Pharmacy on November 22, 2009 12:19 PM  Refills Requested: Medication #1:  TUSSICAPS 10-8 MG XR12H-CAP one every 12hrs if needed [facility] [address] [facility] [phone]   Method Requested: Fax to Local Pharmacy Next Appointment Scheduled: 01/28/2010 LABS Initial call taken by: Barb Merino,  November 22, 2009 12:20 PM  Follow-up for Phone Call        last filled and ov- 07/26/09. Army Fossa CMA  November 22, 2009 12:55 PM   Additional Follow-up for Phone Call Additional follow up Details #1::        refill x1 Additional Follow-up by: Loreen Freud DO,  November 22, 2009 2:18 PM    Prescriptions: TUSSICAPS 10-8 MG XR12H-CAP (HYDROCOD POLST-CHLORPHEN POLST) one every 12hrs if needed  #12 x 0   Entered by:   Army Fossa CMA   Authorized by:   Loreen Freud DO   Signed by:   Army Fossa CMA on 11/22/2009   Method used:   Printed then [facility]ed [facility] ...       [facility]  [address] [phone][facility]ail[facility]       [address]       [place], [place]  [address]       Ph: [phone] or [phone]       Fax: [phone]   RxID:   [id number]

## 2011-01-21 NOTE — Assessment & Plan Note (Signed)
[facility]                         GASTROENTEROLOGY OFFICE NOTE   DORIS, GRUHN                      MRN:          [id number]  DATE:11/24/2007                            DOB:          1946-01-27    HISTORY:  Mrs. Sandberg presents today for followup.  She is a 65-year-  old, who is followed in this office for diarrhea-predominant irritable  bowel syndrome.  She also has a history of adenomatous colon polyps,  reflux disease and diverticulosis.  She was last evaluated June 11, 2007.  At that time, she was given the probiotic, Align, to use on  demand.  As well, Imodium p.r.n.  She presents today for followup.   Patient reports to me that she has been doing better since her last  visit.  She still has her unpredictable IBS symptoms, but since she has  been on Align, these have been less intense.  She also has used Imodium  occasionally when leaving the house for social events or traveling.  This has helped, as well.  No new or interval problems.   MEDICATIONS:  Remain Crestor, Nexium, Synthroid, Premarin, baby aspirin,  Zyrtec, calcium, multivitamin, and Align.   PHYSICAL EXAM:  Reveals a well-appearing female, in no acute distress.  Blood pressure 138/78, heart rate is 80, weight is 170 pounds.  HEENT:  Sclerae are anicteric.  LUNGS:  Clear.  HEART:  Regular.  ABDOMEN:  Soft without tenderness, mass or hernia.   IMPRESSION:  1. Diarrhea-predominant irritable bowel syndrome.  2. Gastroesophageal reflux disease.  3. Adenomatous colon polyps.  4. Diverticulosis.   RECOMMENDATIONS:  1. Continue to use Align on demand.  Multiple samples provided.  2. Continue to use Imodium p.r.n. and prophylactically.  3. Surveillance colonoscopy due in September of 2012.  4. Resume general medical care with Dr. Laury Axon; GI followup p.r.n.     Wilhemina Bonito. Marina Goodell, MD  Electronically Signed    JNP/MedQ  DD: 11/24/2007  DT: 11/24/2007  Job #: [id number]   cc:   Lelon Perla, DO

## 2011-01-21 NOTE — Assessment & Plan Note (Signed)
[name]                         GASTROENTEROLOGY OFFICE NOTE   Michelle [name], PHO                      MRN:          [id number]  DATE:05/05/2007                            DOB:          1946-01-31    OFFICE CONSULTATION:   REASON FOR CONSULTATION:  Loose stools, gas and bloating.   HISTORY:  This is a 65 year old white female with a history of  gastroesophageal reflux disease, hypothyroidism, dyslipidemia, and  adenomatous colon polyps.  She was referred through the courtesy of Dr.  Laury [name] for evaluation of chronic abdominal complaints.  The patient was  seen on one occasion in the outpatient endoscopy center for direct  colonoscopy December 29, 2003.  She was found to have a 9-mm transverse  colon adenoma, which was removed.  As well, diverticulosis.  Follow-up  in 3 years recommended.  The patient did receive a recall letter.  She  informs me that she has had a 10+ year history of intermittent problems  with postprandial urgency and loose stools.  Occasionally more normal  bowel habits.  There is significant gas and bloating, which results in  discomfort.  She has taken Imodium on rare occasions, which has led to  somewhat constipated bowels.  Nothing recently.  These problems inhibit  her a bit socially, as she is fearful to leave the house or be out in  public where she might not have access to a rest room.  She denies  laxative use.  Stools are soft or occasionally watery.  She denies  nausea, vomiting, melena, hematochezia, change in appetite or weight  loss.  In terms of reflux, she states she has a chronic cough which  responds to Nexium.  No heartburn, indigestion or dysphagia.   PAST MEDICAL HISTORY:  As above.   PAST SURGICAL HISTORY:  1. Cholecystectomy.  2. Hysterectomy.  3. Tubal ligation.   ALLERGIES:  No known drug allergies.   CURRENT MEDICATIONS:  1. Crestor 10 mg daily.  2. Nexium 40 mg daily.  3. Synthroid 50 mcg  daily.  4. Premarin 0.45 mg daily.  5. Aspirin 81 mg daily.  6. Zyrtec 10 mg p.r.n.  7. Calcium 1200 mg daily.  8. Multivitamin daily.   FAMILY HISTORY:  No family history of gastrointestinal malignancy.   SOCIAL HISTORY:  The patient is married with two children.  She lives  with her husband.  She has a twelfth grade education.  She is employed  as a Retail buyer.  She does not smoke or use alcohol.   REVIEW OF SYSTEMS:  Per diagnostic evaluation form.   PHYSICAL EXAM:  A well-appearing female in no acute distress.  Blood pressure is 140/78, heart rate 72, weight is 172.8 pounds.  She is  5 feet 3 inches in height.  HEENT:  Sclerae are anicteric.  Conjunctivae are pink.  Oral mucosa is  intact.  There is no adenopathy.  LUNGS:  Clear.  HEART:  Regular.  ABDOMEN:  Obese and soft without tenderness, mass or hernia.  EXTREMITIES:  Without edema.   IMPRESSION:  1. Chronic abdominal complaints, manifested principally by  intermittent postprandial urgency and loose stools.  Symptoms      compatible with irritable bowel syndrome.  2. Gastroesophageal reflux disease with the pulmonary manifestation of      cough.  Symptoms controlled with Nexium.  3. History of adenomatous colon polyp.  Due for follow-up.  4. Incidental diverticulosis.   RECOMMENDATIONS:  1. Obtain tissue transglutaminase antibody as a screening test for      occult sprue.  2. A long discussion today regarding irritable bowel syndrome.  As      well, supplemental literature provided.  3. Schedule surveillance colonoscopy.  The nature of the procedure as      well as the risks, benefits, and alternatives have been reviewed.      She understood and agreed to proceed.  4. Office follow-up after colonoscopy complete to discuss different      strategies to treat irritable bowel if other testing negative.      These to include probiotics, fiber, antispasmodics and      antidiarrheals.     Wilhemina Bonito. Marina Goodell,  MD  Electronically Signed    JNP/MedQ  DD: 05/05/2007  DT: 05/06/2007  Job #: [id number]   cc:   Lelon Perla, DO

## 2011-01-21 NOTE — Assessment & Plan Note (Signed)
[facility]                         GASTROENTEROLOGY OFFICE NOTE   MARIFER, HURD                      MRN:          [id number]  DATE:06/11/2007                            DOB:          1946-03-24    HISTORY:  Ms. Michelle [name] presents today for followup.  She is a 65 year old  who was evaluated on May 05, 2007, for loose stools, gas, and  bloating.  See that dictation for details.  A tissue transglutaminase  antibody was obtained and returned normal.  She subsequently underwent a  complete colonoscopy on May 20, 2007.  She was found to have a  colon polyp which was removed and found to be adenomatous - followup in  5 years recommended, also mild diverticulosis.  Random biopsies of the  colon were taken and returned normal.  No evidence of microscopic  colitis.  She was felt to have irritable bowel syndrome and presents  today for followup.  I have reviewed with her in detail her workup to  date.  We have reviewed her history again.  She states her irritable  bowel can bother her anywhere from 3-10 times per month.  It could be  problematic when she is out socially as urgency can be a real issue.  She can also go a week or so with absolutely no problems.  No tendency  towards constipation.   CURRENT MEDICATIONS:  Crestor, Nexium, Synthroid, Premarin, aspirin,  Zyrtec, calcium, multivitamin.   PHYSICAL EXAMINATION:  GENERAL:  Well-appearing female in no acute  distress.  VITAL SIGNS:  Blood pressure is 146/82, heart rate is 80, weight is  175.8 pounds.  ABDOMEN:  Not re-examined.   IMPRESSION:  1. Diarrhea predominant irritable bowel syndrome.  2. Adenomatous colon polyp.  3. Gastroesophageal reflux disease.  4. Incidental diverticulosis.   RECOMMENDATIONS:  1. Probiotic Align 1 daily for 2 weeks.  Four weeks of samples have      been provided.  She may repeat a course if necessary.  2. Use Imodium p.r.n. and prophylactically.  3.  Office followup in 3 months to assess response to therapies.  4. Colonoscopy in 5 years is recommended.  5. Ongoing general medical care with Dr. Laury Axon.     Wilhemina Bonito. Marina Goodell, MD  Electronically Signed    JNP/MedQ  DD: 06/11/2007  DT: 06/11/2007  Job #: [id number]   cc:   Lelon Perla, DO

## 2011-04-09 ENCOUNTER — Other Ambulatory Visit (INDEPENDENT_AMBULATORY_CARE_PROVIDER_SITE_OTHER): Payer: 59

## 2011-04-09 DIAGNOSIS — E785 Hyperlipidemia, unspecified: Secondary | ICD-10-CM

## 2011-04-09 LAB — LIPID PANEL
HDL: 60.2 mg/dL (ref >39.00)
LDL Cholesterol: 74 mg/dL (ref 0–99)
Total CHOL/HDL Ratio: 3
Triglycerides: 110 mg/dL (ref 0.0–149.0)

## 2011-04-10 LAB — HEPATIC FUNCTION PANEL: Albumin: 4.5 g/dL (ref 3.5–5.2)

## 2011-09-23 ENCOUNTER — Ambulatory Visit (INDEPENDENT_AMBULATORY_CARE_PROVIDER_SITE_OTHER): Payer: 59 | Admitting: Family Medicine

## 2011-09-23 ENCOUNTER — Encounter: Payer: Self-pay | Admitting: Family Medicine

## 2011-09-23 VITALS — BP 122/70 | HR 75 | Temp 98.7°F | Ht 63.0 in | Wt 153.8 lb

## 2011-09-23 DIAGNOSIS — Z780 Asymptomatic menopausal state: Secondary | ICD-10-CM

## 2011-09-23 DIAGNOSIS — Z0000 Encounter for general adult medical examination without abnormal findings: Secondary | ICD-10-CM

## 2011-09-23 DIAGNOSIS — R319 Hematuria, unspecified: Secondary | ICD-10-CM

## 2011-09-23 DIAGNOSIS — I10 Essential (primary) hypertension: Secondary | ICD-10-CM

## 2011-09-23 DIAGNOSIS — E785 Hyperlipidemia, unspecified: Secondary | ICD-10-CM

## 2011-09-23 DIAGNOSIS — N390 Urinary tract infection, site not specified: Secondary | ICD-10-CM

## 2011-09-23 DIAGNOSIS — Z23 Encounter for immunization: Secondary | ICD-10-CM

## 2011-09-23 DIAGNOSIS — K219 Gastro-esophageal reflux disease without esophagitis: Secondary | ICD-10-CM

## 2011-09-23 LAB — HEPATIC FUNCTION PANEL
AST: 24 U/L (ref 0–37)
Albumin: 4.4 g/dL (ref 3.5–5.2)
Alkaline Phosphatase: 69 U/L (ref 39–117)
Bilirubin, Direct: 0 mg/dL (ref 0.0–0.3)

## 2011-09-23 LAB — BASIC METABOLIC PANEL
Calcium: 9.4 mg/dL (ref 8.4–10.5)
GFR: 82.28 mL/min (ref >60.00)
Sodium: 143 mEq/L (ref 135–145)

## 2011-09-23 LAB — CBC WITH DIFFERENTIAL/PLATELET
Basophils Absolute: 0 10*3/uL (ref 0.0–0.1)
Basophils Relative: 0.6 % (ref 0.0–3.0)
Hemoglobin: 12.6 g/dL (ref 12.0–15.0)
Lymphocytes Relative: 26.3 % (ref 12.0–46.0)
Monocytes Relative: 9.3 % (ref 3.0–12.0)
Neutro Abs: 4 10*3/uL (ref 1.4–7.7)
RBC: 4.27 Mil/uL (ref 3.87–5.11)

## 2011-09-23 LAB — LIPID PANEL
Total CHOL/HDL Ratio: 2
VLDL: 15.2 mg/dL (ref 0.0–40.0)

## 2011-09-23 LAB — TSH: TSH: 2.23 u[IU]/mL (ref 0.35–5.50)

## 2011-09-23 LAB — POCT URINALYSIS DIPSTICK
Ketones, UA: NEGATIVE
Nitrite, UA: POSITIVE
Protein, UA: NEGATIVE
Urobilinogen, UA: 0.2

## 2011-09-23 MED ORDER — PNEUMOCOCCAL VAC POLYVALENT 25 MCG/0.5ML IJ INJ: 0.5000 mL | INJECTION | Freq: Once | INTRAMUSCULAR | Status: DC

## 2011-09-23 MED ORDER — OMEPRAZOLE 20 MG PO CPDR: 20.0000 mg | DELAYED_RELEASE_CAPSULE | Freq: Every day | ORAL | Status: DC

## 2011-09-23 MED ORDER — AMLODIPINE BESYLATE 5 MG PO TABS: 5.0000 mg | ORAL_TABLET | Freq: Every day | ORAL | Status: DC

## 2011-09-23 MED ORDER — PRAVASTATIN SODIUM 40 MG PO TABS: 40.0000 mg | ORAL_TABLET | Freq: Every day | ORAL | Status: DC

## 2011-09-23 NOTE — Patient Instructions (Signed)

## 2011-09-23 NOTE — Progress Notes (Signed)
Subjective:     Michelle [name] is a 66 y.o. female and is here for a comprehensive physical exam. The patient reports no problems.  History   Social History  . Marital Status: Married    Spouse Name: N/A    Number of Children: N/A  . Years of Education: N/A   Occupational History  . retired    Social History Main Topics  . Smoking status: Never Smoker   . Smokeless tobacco: Never Used  . Alcohol Use: No  . Drug Use: No  . Sexually Active: Yes -- Female partner(s)   Other Topics Concern  . Not on file   Social History Narrative   Exercise--no   Health Maintenance  Topic Date Due  . Pneumococcal Polysaccharide Vaccine Age 66 And Over  02/18/2011  . Influenza Vaccine  06/09/2011  . Colonoscopy  05/23/2012  . Mammogram  03/22/2013  . Tetanus/tdap  07/03/2015  . Zostavax  Completed    The following portions of the patient's history were reviewed and updated as appropriate: allergies, current medications, past family history, past medical history, past social history, past surgical history and problem list.  Review of Systems Review of Systems  Constitutional: Negative for activity change, appetite change and fatigue.  HENT: Negative for hearing loss, congestion, tinnitus and ear discharge.  dentist q97m Eyes: Negative for visual disturbance (see optho q1y -- + cataracts and glaucoma)  Respiratory: Negative for cough, chest tightness and shortness of breath.   Cardiovascular: Negative for chest pain, palpitations and leg swelling.  Gastrointestinal: Negative for abdominal pain, diarrhea, constipation and abdominal distention.  Genitourinary: Negative for urgency, frequency, decreased urine volume and difficulty urinating.  Musculoskeletal: Negative for back pain, arthralgias and gait problem.  Skin: Negative for color change, pallor and rash.  Neurological: Negative for dizziness, light-headedness, numbness and headaches.  Hematological: Negative for adenopathy. Does not  bruise/bleed easily.  Psychiatric/Behavioral: Negative for suicidal ideas, confusion, sleep disturbance, self-injury, dysphoric mood, decreased concentration and agitation.       Objective:    BP 122/70  Pulse 75  Temp(Src) 98.7 F (37.1 C) (Oral)  Ht 5\' 3"  (1.6 m)  Wt 153 lb 12.8 oz (69.763 kg)  BMI 27.24 kg/m2  SpO2 96% General appearance: alert, cooperative, appears stated age and no distress Head: Normocephalic, without obvious abnormality, atraumatic Eyes: negative findings: lids and lashes normal, conjunctivae and sclerae normal, corneas clear and pupils equal, round, reactive to light and accomodation, positive findings: - Ears: normal TM's and external ear canals both ears Nose: Nares normal. Septum midline. Mucosa normal. No drainage or sinus tenderness. Throat: lips, mucosa, and tongue normal; teeth and gums normal Neck: no adenopathy, no carotid bruit, no JVD, supple, symmetrical, trachea midline and thyroid not enlarged, symmetric, no tenderness/mass/nodules Back: symmetric, no curvature. ROM normal. No CVA tenderness. Lungs: clear to auscultation bilaterally Breasts: gyn Heart: regular rate and rhythm, S1, S2 normal, no murmur, click, rub or gallop Abdomen: soft, non-tender; bowel sounds normal; no masses,  no organomegaly Pelvic: gyn Extremities: extremities normal, atraumatic, no cyanosis or edema Pulses: 2+ and symmetric Skin: Skin color, texture, turgor normal. No rashes or lesions Lymph nodes: Cervical, supraclavicular, and axillary nodes normal. Neurologic: Alert and oriented X 3, normal strength and tone. Normal symmetric reflexes. Normal coordination and gait    Assessment:    Healthy female exam.    htn-stable, con' t meds Hyperlipidemia-stable con't meds gerd-- may be cause of dry cough-- omperazole Allergies--- also may be cause of cough--take  allergy med regularly--- if cough no better with antihistamine or omeprazole f/uGI Plan:    ghm utd Check  labs See After Visit Summary for Counseling Recommendations

## 2011-09-25 LAB — URINE CULTURE

## 2011-09-26 ENCOUNTER — Other Ambulatory Visit: Payer: Self-pay | Admitting: *Deleted

## 2011-09-26 MED ORDER — CIPROFLOXACIN HCL 500 MG PO TABS: 500.0000 mg | ORAL_TABLET | Freq: Two times a day (BID) | ORAL | Status: AC

## 2011-12-09 ENCOUNTER — Telehealth: Payer: Self-pay | Admitting: Family Medicine

## 2011-12-09 DIAGNOSIS — R053 Chronic cough: Secondary | ICD-10-CM

## 2011-12-09 DIAGNOSIS — R05 Cough: Secondary | ICD-10-CM

## 2011-12-09 NOTE — Telephone Encounter (Signed)
Patient made aware orders have been put in the system.     KP

## 2011-12-09 NOTE — Telephone Encounter (Signed)
Can do cxr for chronic cough

## 2011-12-09 NOTE — Telephone Encounter (Signed)
Patient calling, states every so often, that Dr. Laury Axon sends her for a chest xray.  Patient states it has been a long time since her last one.  Please advise if you do or do not want her to have one.  She would like to have same day as BMD at [facility] on 12-16-11.

## 2011-12-16 ENCOUNTER — Ambulatory Visit (INDEPENDENT_AMBULATORY_CARE_PROVIDER_SITE_OTHER)
Admission: RE | Admit: 2011-12-16 | Discharge: 2011-12-16 | Disposition: A | Discharge status: Home / Self Care | Payer: 59 | Source: Ambulatory Visit | Attending: Family Medicine | Admitting: Family Medicine

## 2011-12-16 DIAGNOSIS — R05 Cough: Secondary | ICD-10-CM

## 2011-12-16 DIAGNOSIS — R053 Chronic cough: Secondary | ICD-10-CM

## 2011-12-16 DIAGNOSIS — Z780 Asymptomatic menopausal state: Secondary | ICD-10-CM

## 2011-12-16 DIAGNOSIS — R059 Cough, unspecified: Secondary | ICD-10-CM

## 2011-12-17 ENCOUNTER — Other Ambulatory Visit: Payer: Self-pay | Admitting: Family Medicine

## 2011-12-17 DIAGNOSIS — E785 Hyperlipidemia, unspecified: Secondary | ICD-10-CM

## 2011-12-17 MED ORDER — PRAVASTATIN SODIUM 40 MG PO TABS: 40.0000 mg | ORAL_TABLET | Freq: Every day | ORAL | Status: DC

## 2011-12-17 NOTE — Telephone Encounter (Signed)
Refill for  Pravastatin Sodium 40MG  Tab Requesting a 90-day Supply   Last filled 1.15.13 for 30-day supply Last OV 1.15.13 Last Instruction Take 1-tablet by mouth daily

## 2011-12-30 ENCOUNTER — Other Ambulatory Visit: Payer: Self-pay | Admitting: Family Medicine

## 2011-12-30 DIAGNOSIS — I10 Essential (primary) hypertension: Secondary | ICD-10-CM

## 2011-12-30 MED ORDER — AMLODIPINE BESYLATE 5 MG PO TABS: 5.0000 mg | ORAL_TABLET | Freq: Every day | ORAL | Status: DC

## 2011-12-30 NOTE — Telephone Encounter (Signed)
Patient states [facility] contacted [place] last week regarding this prescription and she is now down to 1. Per patient her insurance will only pay for 90. Can you please send a prescription today for  AmLODIPine Besylate (Tab) NORVASC 5 MG  Take 1 tablet (5 mg total) by mouth daily. Qty 90 [id number] - [place], [place] - [address] Patient ph# [phone]

## 2012-01-05 ENCOUNTER — Other Ambulatory Visit: Payer: Self-pay | Admitting: Family Medicine

## 2012-01-05 DIAGNOSIS — K219 Gastro-esophageal reflux disease without esophagitis: Secondary | ICD-10-CM

## 2012-01-05 MED ORDER — OMEPRAZOLE 20 MG PO CPDR: 20.0000 mg | DELAYED_RELEASE_CAPSULE | Freq: Every day | ORAL | Status: DC

## 2012-01-05 NOTE — Telephone Encounter (Signed)
refill for Omeprazole DR 20MG  Capsule  requesting 90-day supply  Last written 1.15.13 Last qty 30 Take 1 capsule by mouth daily Last OV 1.15.13

## 2012-01-08 ENCOUNTER — Telehealth: Payer: Self-pay | Admitting: Family Medicine

## 2012-01-08 NOTE — Telephone Encounter (Signed)
Sorry- this is one of the first dexas I have read on line , and I think I made a mistake Her bone density is still in the normal range -though slt decrease in BMD from last time  Will send this to PCP and also Jilda-thanks

## 2012-01-09 NOTE — Telephone Encounter (Signed)
Discussed with patient and she voiced understanding and agreed to continue calcium and start Vitamin D....     KP

## 2012-01-09 NOTE — Telephone Encounter (Signed)
bmd--normal---con't vita D and calcium

## 2012-06-13 ENCOUNTER — Other Ambulatory Visit: Payer: Self-pay | Admitting: Family Medicine

## 2012-06-25 ENCOUNTER — Encounter: Payer: Self-pay | Admitting: Internal Medicine

## 2012-07-26 ENCOUNTER — Other Ambulatory Visit: Payer: Self-pay | Admitting: Family Medicine

## 2012-07-26 DIAGNOSIS — I10 Essential (primary) hypertension: Secondary | ICD-10-CM

## 2012-07-26 MED ORDER — AMLODIPINE BESYLATE 5 MG PO TABS: 5.0000 mg | ORAL_TABLET | Freq: Every day | ORAL | Status: DC

## 2012-07-26 NOTE — Telephone Encounter (Signed)
Refill Amlodipine Besylate 5MG  Tab requesting 90-day supply--take one tablt by mouth daily last wrt 4.23.13 #90-wt/1-refill ---last ov Annual 1.15.13 next CPE scheduled for 1.30.14

## 2012-08-08 LAB — HM MAMMOGRAPHY

## 2012-08-25 ENCOUNTER — Encounter: Payer: Self-pay | Admitting: Internal Medicine

## 2012-09-21 ENCOUNTER — Ambulatory Visit (AMBULATORY_SURGERY_CENTER): Payer: 59

## 2012-09-21 VITALS — Ht 62.5 in | Wt 156.6 lb

## 2012-09-21 DIAGNOSIS — Z1211 Encounter for screening for malignant neoplasm of colon: Secondary | ICD-10-CM

## 2012-09-21 DIAGNOSIS — Z86010 Personal history of colonic polyps: Secondary | ICD-10-CM

## 2012-09-21 MED ORDER — MOVIPREP 100 G PO SOLR: ORAL | Status: DC

## 2012-10-07 ENCOUNTER — Ambulatory Visit (INDEPENDENT_AMBULATORY_CARE_PROVIDER_SITE_OTHER): Payer: 59 | Admitting: Family Medicine

## 2012-10-07 ENCOUNTER — Encounter: Payer: Self-pay | Admitting: Family Medicine

## 2012-10-07 VITALS — BP 130/70 | HR 75 | Temp 98.6°F | Ht 62.75 in | Wt 152.8 lb

## 2012-10-07 DIAGNOSIS — Z0000 Encounter for general adult medical examination without abnormal findings: Secondary | ICD-10-CM

## 2012-10-07 DIAGNOSIS — I10 Essential (primary) hypertension: Secondary | ICD-10-CM

## 2012-10-07 DIAGNOSIS — E039 Hypothyroidism, unspecified: Secondary | ICD-10-CM

## 2012-10-07 DIAGNOSIS — E785 Hyperlipidemia, unspecified: Secondary | ICD-10-CM

## 2012-10-07 LAB — CBC WITH DIFFERENTIAL/PLATELET
Basophils Relative: 0.7 % (ref 0.0–3.0)
Eosinophils Relative: 3.2 % (ref 0.0–5.0)
Lymphocytes Relative: 24.7 % (ref 12.0–46.0)
Monocytes Relative: 8.7 % (ref 3.0–12.0)
Neutrophils Relative %: 62.7 % (ref 43.0–77.0)
Platelets: 255 10*3/uL (ref 150.0–400.0)
RBC: 4.29 Mil/uL (ref 3.87–5.11)
WBC: 5.7 10*3/uL (ref 4.5–10.5)

## 2012-10-07 LAB — LIPID PANEL
LDL Cholesterol: 58 mg/dL (ref 0–99)
Total CHOL/HDL Ratio: 2
Triglycerides: 58 mg/dL (ref 0.0–149.0)

## 2012-10-07 LAB — HEPATIC FUNCTION PANEL
ALT: 19 U/L (ref 0–35)
AST: 23 U/L (ref 0–37)
Alkaline Phosphatase: 60 U/L (ref 39–117)
Bilirubin, Direct: 0 mg/dL (ref 0.0–0.3)
Total Bilirubin: 0.6 mg/dL (ref 0.3–1.2)
Total Protein: 7.1 g/dL (ref 6.0–8.3)

## 2012-10-07 LAB — BASIC METABOLIC PANEL
BUN: 12 mg/dL (ref 6–23)
Creatinine, Ser: 0.7 mg/dL (ref 0.4–1.2)
GFR: 95.05 mL/min (ref >60.00)

## 2012-10-07 MED ORDER — PRAVASTATIN SODIUM 40 MG PO TABS: ORAL_TABLET | ORAL | Status: DC

## 2012-10-07 MED ORDER — AMLODIPINE BESYLATE 5 MG PO TABS: 5.0000 mg | ORAL_TABLET | Freq: Every day | ORAL | Status: DC

## 2012-10-07 NOTE — Assessment & Plan Note (Signed)
Check labs con't meds 

## 2012-10-07 NOTE — Patient Instructions (Addendum)
Preventive Care for Adults, Female A healthy lifestyle and preventive care can promote health and wellness. Preventive health guidelines for women include the following key practices.  A routine yearly physical is a good way to check with your caregiver about your health and preventive screening. It is a chance to share any concerns and updates on your health, and to receive a thorough exam.  Visit your dentist for a routine exam and preventive care every 6 months. Brush your teeth twice a day and floss once a day. Good oral hygiene prevents tooth decay and gum disease.  The frequency of eye exams is based on your age, health, family medical history, use of contact lenses, and other factors. Follow your caregiver's recommendations for frequency of eye exams.  Eat a healthy diet. Foods like vegetables, fruits, whole grains, low-fat dairy products, and lean protein foods contain the nutrients you need without too many calories. Decrease your intake of foods high in solid fats, added sugars, and salt. Eat the right amount of calories for you.Get information about a proper diet from your caregiver, if necessary.  Regular physical exercise is one of the most important things you can do for your health. Most adults should get at least 150 minutes of moderate-intensity exercise (any activity that increases your heart rate and causes you to sweat) each week. In addition, most adults need muscle-strengthening exercises on 2 or more days a week.  Maintain a healthy weight. The body mass index (BMI) is a screening tool to identify possible weight problems. It provides an estimate of body fat based on height and weight. Your caregiver can help determine your BMI, and can help you achieve or maintain a healthy weight.For adults 20 years and older:  A BMI below 18.5 is considered underweight.  A BMI of 18.5 to 24.9 is normal.  A BMI of 25 to 29.9 is considered overweight.  A BMI of 30 and above is  considered obese.  Maintain normal blood lipids and cholesterol levels by exercising and minimizing your intake of saturated fat. Eat a balanced diet with plenty of fruit and vegetables. Blood tests for lipids and cholesterol should begin at age 20 and be repeated every 5 years. If your lipid or cholesterol levels are high, you are over 50, or you are at high risk for heart disease, you may need your cholesterol levels checked more frequently.Ongoing high lipid and cholesterol levels should be treated with medicines if diet and exercise are not effective.  If you smoke, find out from your caregiver how to quit. If you do not use tobacco, do not start.  If you are pregnant, do not drink alcohol. If you are breastfeeding, be very cautious about drinking alcohol. If you are not pregnant and choose to drink alcohol, do not exceed 1 drink per day. One drink is considered to be 12 ounces (355 mL) of beer, 5 ounces (148 mL) of wine, or 1.5 ounces (44 mL) of liquor.  Avoid use of street drugs. Do not share needles with anyone. Ask for help if you need support or instructions about stopping the use of drugs.  High blood pressure causes heart disease and increases the risk of stroke. Your blood pressure should be checked at least every 1 to 2 years. Ongoing high blood pressure should be treated with medicines if weight loss and exercise are not effective.  If you are 55 to 67 years old, ask your caregiver if you should take aspirin to prevent strokes.  Diabetes   screening involves taking a blood sample to check your fasting blood sugar level. This should be done once every 3 years, after age 45, if you are within normal weight and without risk factors for diabetes. Testing should be considered at a younger age or be carried out more frequently if you are overweight and have at least 1 risk factor for diabetes.  Breast cancer screening is essential preventive care for women. You should practice "breast  self-awareness." This means understanding the normal appearance and feel of your breasts and may include breast self-examination. Any changes detected, no matter how small, should be reported to a caregiver. Women in their 20s and 30s should have a clinical breast exam (CBE) by a caregiver as part of a regular health exam every 1 to 3 years. After age 40, women should have a CBE every year. Starting at age 40, women should consider having a mammography (breast X-ray test) every year. Women who have a family history of breast cancer should talk to their caregiver about genetic screening. Women at a high risk of breast cancer should talk to their caregivers about having magnetic resonance imaging (MRI) and a mammography every year.  The Pap test is a screening test for cervical cancer. A Pap test can show cell changes on the cervix that might become cervical cancer if left untreated. A Pap test is a procedure in which cells are obtained and examined from the lower end of the uterus (cervix).  Women should have a Pap test starting at age 21.  Between ages 21 and 29, Pap tests should be repeated every 2 years.  Beginning at age 30, you should have a Pap test every 3 years as long as the past 3 Pap tests have been normal.  Some women have medical problems that increase the chance of getting cervical cancer. Talk to your caregiver about these problems. It is especially important to talk to your caregiver if a new problem develops soon after your last Pap test. In these cases, your caregiver may recommend more frequent screening and Pap tests.  The above recommendations are the same for women who have or have not gotten the vaccine for human papillomavirus (HPV).  If you had a hysterectomy for a problem that was not cancer or a condition that could lead to cancer, then you no longer need Pap tests. Even if you no longer need a Pap test, a regular exam is a good idea to make sure no other problems are  starting.  If you are between ages 65 and 70, and you have had normal Pap tests going back 10 years, you no longer need Pap tests. Even if you no longer need a Pap test, a regular exam is a good idea to make sure no other problems are starting.  If you have had past treatment for cervical cancer or a condition that could lead to cancer, you need Pap tests and screening for cancer for at least 20 years after your treatment.  If Pap tests have been discontinued, risk factors (such as a new sexual partner) need to be reassessed to determine if screening should be resumed.  The HPV test is an additional test that may be used for cervical cancer screening. The HPV test looks for the virus that can cause the cell changes on the cervix. The cells collected during the Pap test can be tested for HPV. The HPV test could be used to screen women aged 30 years and older, and should   be used in women of any age who have unclear Pap test results. After the age of 30, women should have HPV testing at the same frequency as a Pap test.  Colorectal cancer can be detected and often prevented. Most routine colorectal cancer screening begins at the age of 50 and continues through age 75. However, your caregiver may recommend screening at an earlier age if you have risk factors for colon cancer. On a yearly basis, your caregiver may provide home test kits to check for hidden blood in the stool. Use of a small camera at the end of a tube, to directly examine the colon (sigmoidoscopy or colonoscopy), can detect the earliest forms of colorectal cancer. Talk to your caregiver about this at age 50, when routine screening begins. Direct examination of the colon should be repeated every 5 to 10 years through age 75, unless early forms of pre-cancerous polyps or small growths are found.  Hepatitis C blood testing is recommended for all people born from 1945 through 1965 and any individual with known risks for hepatitis C.  Practice  safe sex. Use condoms and avoid high-risk sexual practices to reduce the spread of sexually transmitted infections (STIs). STIs include gonorrhea, chlamydia, syphilis, trichomonas, herpes, HPV, and human immunodeficiency virus (HIV). Herpes, HIV, and HPV are viral illnesses that have no cure. They can result in disability, cancer, and death. Sexually active women aged 25 and younger should be checked for chlamydia. Older women with new or multiple partners should also be tested for chlamydia. Testing for other STIs is recommended if you are sexually active and at increased risk.  Osteoporosis is a disease in which the bones lose minerals and strength with aging. This can result in serious bone fractures. The risk of osteoporosis can be identified using a bone density scan. Women ages 65 and over and women at risk for fractures or osteoporosis should discuss screening with their caregivers. Ask your caregiver whether you should take a calcium supplement or vitamin D to reduce the rate of osteoporosis.  Menopause can be associated with physical symptoms and risks. Hormone replacement therapy is available to decrease symptoms and risks. You should talk to your caregiver about whether hormone replacement therapy is right for you.  Use sunscreen with sun protection factor (SPF) of 30 or more. Apply sunscreen liberally and repeatedly throughout the day. You should seek shade when your shadow is shorter than you. Protect yourself by wearing long sleeves, pants, a wide-brimmed hat, and sunglasses year round, whenever you are outdoors.  Once a month, do a whole body skin exam, using a mirror to look at the skin on your back. Notify your caregiver of new moles, moles that have irregular borders, moles that are larger than a pencil eraser, or moles that have changed in shape or color.  Stay current with required immunizations.  Influenza. You need a dose every fall (or winter). The composition of the flu vaccine  changes each year, so being vaccinated once is not enough.  Pneumococcal polysaccharide. You need 1 to 2 doses if you smoke cigarettes or if you have certain chronic medical conditions. You need 1 dose at age 65 (or older) if you have never been vaccinated.  Tetanus, diphtheria, pertussis (Tdap, Td). Get 1 dose of Tdap vaccine if you are younger than age 65, are over 65 and have contact with an infant, are a healthcare worker, are pregnant, or simply want to be protected from whooping cough. After that, you need a Td   booster dose every 10 years. Consult your caregiver if you have not had at least 3 tetanus and diphtheria-containing shots sometime in your life or have a deep or dirty wound.  HPV. You need this vaccine if you are a woman age 26 or younger. The vaccine is given in 3 doses over 6 months.  Measles, mumps, rubella (MMR). You need at least 1 dose of MMR if you were born in 1957 or later. You may also need a second dose.  Meningococcal. If you are age 19 to 21 and a first-year college student living in a residence hall, or have one of several medical conditions, you need to get vaccinated against meningococcal disease. You may also need additional booster doses.  Zoster (shingles). If you are age 60 or older, you should get this vaccine.  Varicella (chickenpox). If you have never had chickenpox or you were vaccinated but received only 1 dose, talk to your caregiver to find out if you need this vaccine.  Hepatitis A. You need this vaccine if you have a specific risk factor for hepatitis A virus infection or you simply wish to be protected from this disease. The vaccine is usually given as 2 doses, 6 to 18 months apart.  Hepatitis B. You need this vaccine if you have a specific risk factor for hepatitis B virus infection or you simply wish to be protected from this disease. The vaccine is given in 3 doses, usually over 6 months. Preventive Services / Frequency Ages 19 to 39  Blood  pressure check.** / Every 1 to 2 years.  Lipid and cholesterol check.** / Every 5 years beginning at age 20.  Clinical breast exam.** / Every 3 years for women in their 20s and 30s.  Pap test.** / Every 2 years from ages 21 through 29. Every 3 years starting at age 30 through age 65 or 70 with a history of 3 consecutive normal Pap tests.  HPV screening.** / Every 3 years from ages 30 through ages 65 to 70 with a history of 3 consecutive normal Pap tests.  Hepatitis C blood test.** / For any individual with known risks for hepatitis C.  Skin self-exam. / Monthly.  Influenza immunization.** / Every year.  Pneumococcal polysaccharide immunization.** / 1 to 2 doses if you smoke cigarettes or if you have certain chronic medical conditions.  Tetanus, diphtheria, pertussis (Tdap, Td) immunization. / A one-time dose of Tdap vaccine. After that, you need a Td booster dose every 10 years.  HPV immunization. / 3 doses over 6 months, if you are 26 and younger.  Measles, mumps, rubella (MMR) immunization. / You need at least 1 dose of MMR if you were born in 1957 or later. You may also need a second dose.  Meningococcal immunization. / 1 dose if you are age 19 to 21 and a first-year college student living in a residence hall, or have one of several medical conditions, you need to get vaccinated against meningococcal disease. You may also need additional booster doses.  Varicella immunization.** / Consult your caregiver.  Hepatitis A immunization.** / Consult your caregiver. 2 doses, 6 to 18 months apart.  Hepatitis B immunization.** / Consult your caregiver. 3 doses usually over 6 months. Ages 40 to 64  Blood pressure check.** / Every 1 to 2 years.  Lipid and cholesterol check.** / Every 5 years beginning at age 20.  Clinical breast exam.** / Every year after age 40.  Mammogram.** / Every year beginning at age 40   and continuing for as long as you are in good health. Consult with your  caregiver.  Pap test.** / Every 3 years starting at age 30 through age 65 or 70 with a history of 3 consecutive normal Pap tests.  HPV screening.** / Every 3 years from ages 30 through ages 65 to 70 with a history of 3 consecutive normal Pap tests.  Fecal occult blood test (FOBT) of stool. / Every year beginning at age 50 and continuing until age 75. You may not need to do this test if you get a colonoscopy every 10 years.  Flexible sigmoidoscopy or colonoscopy.** / Every 5 years for a flexible sigmoidoscopy or every 10 years for a colonoscopy beginning at age 50 and continuing until age 75.  Hepatitis C blood test.** / For all people born from 1945 through 1965 and any individual with known risks for hepatitis C.  Skin self-exam. / Monthly.  Influenza immunization.** / Every year.  Pneumococcal polysaccharide immunization.** / 1 to 2 doses if you smoke cigarettes or if you have certain chronic medical conditions.  Tetanus, diphtheria, pertussis (Tdap, Td) immunization.** / A one-time dose of Tdap vaccine. After that, you need a Td booster dose every 10 years.  Measles, mumps, rubella (MMR) immunization. / You need at least 1 dose of MMR if you were born in 1957 or later. You may also need a second dose.  Varicella immunization.** / Consult your caregiver.  Meningococcal immunization.** / Consult your caregiver.  Hepatitis A immunization.** / Consult your caregiver. 2 doses, 6 to 18 months apart.  Hepatitis B immunization.** / Consult your caregiver. 3 doses, usually over 6 months. Ages 65 and over  Blood pressure check.** / Every 1 to 2 years.  Lipid and cholesterol check.** / Every 5 years beginning at age 20.  Clinical breast exam.** / Every year after age 40.  Mammogram.** / Every year beginning at age 40 and continuing for as long as you are in good health. Consult with your caregiver.  Pap test.** / Every 3 years starting at age 30 through age 65 or 70 with a 3  consecutive normal Pap tests. Testing can be stopped between 65 and 70 with 3 consecutive normal Pap tests and no abnormal Pap or HPV tests in the past 10 years.  HPV screening.** / Every 3 years from ages 30 through ages 65 or 70 with a history of 3 consecutive normal Pap tests. Testing can be stopped between 65 and 70 with 3 consecutive normal Pap tests and no abnormal Pap or HPV tests in the past 10 years.  Fecal occult blood test (FOBT) of stool. / Every year beginning at age 50 and continuing until age 75. You may not need to do this test if you get a colonoscopy every 10 years.  Flexible sigmoidoscopy or colonoscopy.** / Every 5 years for a flexible sigmoidoscopy or every 10 years for a colonoscopy beginning at age 50 and continuing until age 75.  Hepatitis C blood test.** / For all people born from 1945 through 1965 and any individual with known risks for hepatitis C.  Osteoporosis screening.** / A one-time screening for women ages 65 and over and women at risk for fractures or osteoporosis.  Skin self-exam. / Monthly.  Influenza immunization.** / Every year.  Pneumococcal polysaccharide immunization.** / 1 dose at age 65 (or older) if you have never been vaccinated.  Tetanus, diphtheria, pertussis (Tdap, Td) immunization. / A one-time dose of Tdap vaccine if you are over   65 and have contact with an infant, are a healthcare worker, or simply want to be protected from whooping cough. After that, you need a Td booster dose every 10 years.  Varicella immunization.** / Consult your caregiver.  Meningococcal immunization.** / Consult your caregiver.  Hepatitis A immunization.** / Consult your caregiver. 2 doses, 6 to 18 months apart.  Hepatitis B immunization.** / Check with your caregiver. 3 doses, usually over 6 months. ** Family history and personal history of risk and conditions may change your caregiver's recommendations. Document Released: 10/21/2001 Document Revised: 11/17/2011  Document Reviewed: 01/20/2011 [employer] Patient Information 2013 [employer].  

## 2012-10-07 NOTE — Progress Notes (Signed)
Subjective:     Michelle [name] is a 67 y.o. female and is here for a comprehensive physical exam. The patient reports no problems.  History   Social History  . Marital Status: Married    Spouse Name: N/A    Number of Children: N/A  . Years of Education: N/A   Occupational History  . retired    Social History Main Topics  . Smoking status: Never Smoker   . Smokeless tobacco: Never Used  . Alcohol Use: No  . Drug Use: No  . Sexually Active: Yes -- Female partner(s)   Other Topics Concern  . Not on file   Social History Narrative   Exercise--no   Health Maintenance  Topic Date Due  . Influenza Vaccine  05/09/2012  . Colonoscopy  05/25/2012  . Mammogram  08/08/2014  . Pap Smear  09/06/2014  . Tetanus/tdap  07/03/2015  . Pneumococcal Polysaccharide Vaccine Age 32 And Over  Completed  . Zostavax  Completed    The following portions of the patient's history were reviewed and updated as appropriate:  She  has a past medical history of GERD (gastroesophageal reflux disease); Hyperlipidemia; Thyroid disease; Hypertension; IBS (irritable bowel syndrome); Adenomatous colon polyp; Glaucoma(365); Allergy; Low back pain; and hearing loss. She  does not have any pertinent problems on file. She  has past surgical history that includes Tubal ligation (1977); Cholecystectomy (1998); Abdominal hysterectomy (1998); and Gum surgery (1996 / 2013). Her family history includes Breast cancer in her maternal grandmother; Cancer in her father; Cancer (age of onset:59) in her brother; Emphysema in her mother; Glaucoma in her mother; Hypertension in her father and mother; Macular degeneration in her father; Other in her brother; and Prostate cancer in her brother and father. She  reports that she has never smoked. She has never used smokeless tobacco. She reports that she does not drink alcohol or use illicit drugs. She has a current medication list which includes the following prescription(s):  amlodipine, aspirin, biotin, calcium carbonate, cetirizine, chlorpheniramine, levothyroxine, meloxicam, moviprep, multivitamin, omeprazole, pravastatin, align, and travoprost (bak free), and the following Facility-Administered Medications: pneumococcal 23 valent vaccine. Current Outpatient Prescriptions on File Prior to Visit  Medication Sig Dispense Refill  . amLODipine (NORVASC) 5 MG tablet Take 1 tablet (5 mg total) by mouth daily.  90 tablet  0  . aspirin 81 MG tablet Take 160 mg by mouth daily.      . Biotin 1 MG CAPS Take by mouth. Take 1-2 tabs daily      . calcium carbonate (OS-CAL) 600 MG TABS Take 600 mg by mouth 2 (two) times daily with a meal.      . cetirizine (ZYRTEC) 10 MG tablet Take 10 mg by mouth daily.      . chlorpheniramine (CHLOR-TRIMETON) 4 MG tablet Take 4 mg by mouth as needed.      Marland Kitchen levothyroxine (SYNTHROID, LEVOTHROID) 50 MCG tablet Take 1 tablet by mouth daily.      . meloxicam (MOBIC) 15 MG tablet Take 15 mg by mouth daily.      Marland Kitchen MOVIPREP 100 G SOLR Moviprep as directed / no substitutions  1 kit  0  . Multiple Vitamin (MULTIVITAMIN) tablet Take 1 tablet by mouth daily.      Marland Kitchen omeprazole (PRILOSEC) 20 MG capsule Take 1 capsule (20 mg total) by mouth daily.  90 capsule  3  . pravastatin (PRAVACHOL) 40 MG tablet 1 tab by mouth qhs  90 tablet  3  .  Probiotic Product (ALIGN) 4 MG CAPS Take 1 capsule by mouth daily.       Current Facility-Administered Medications on File Prior to Visit  Medication Dose Route Frequency Provider Last Rate Last Dose  . pneumococcal 23 valent vaccine (PNU-IMMUNE) injection 0.5 mL  0.5 mL Intramuscular Once Lelon Perla, DO       She  has no known allergies..  Review of Systems Review of Systems  Constitutional: Negative for activity change, appetite change and fatigue.  HENT: Negative for hearing loss, congestion, tinnitus and ear discharge.  dentist q47m Eyes: Negative for visual disturbance (see optho q1y -- vision corrected to  20/20 with glasses).  Respiratory: Negative for cough, chest tightness and shortness of breath.   Cardiovascular: Negative for chest pain, palpitations and leg swelling.  Gastrointestinal: Negative for abdominal pain, diarrhea, constipation and abdominal distention.  Genitourinary: Negative for urgency, frequency, decreased urine volume and difficulty urinating.  Musculoskeletal: Negative for back pain, arthralgias and gait problem.  Skin: Negative for color change, pallor and rash.  Neurological: Negative for dizziness, light-headedness, numbness and headaches.  Hematological: Negative for adenopathy. Does not bruise/bleed easily.  Psychiatric/Behavioral: Negative for suicidal ideas, confusion, sleep disturbance, self-injury, dysphoric mood, decreased concentration and agitation.       Objective:    BP 140/74  Pulse 75  Temp 98.6 F (37 C) (Oral)  Ht 5' 2.75" (1.594 m)  Wt 152 lb 12.8 oz (69.31 kg)  BMI 27.28 kg/m2  SpO2 97% General appearance: alert, cooperative, appears stated age and no distress Head: Normocephalic, without obvious abnormality, atraumatic Eyes: conjunctivae/corneas clear. PERRL, EOM's intact. Fundi benign. Ears: normal TM's and external ear canals both ears Nose: Nares normal. Septum midline. Mucosa normal. No drainage or sinus tenderness. Throat: lips, mucosa, and tongue normal; teeth and gums normal Neck: no adenopathy, no carotid bruit, no JVD, supple, symmetrical, trachea midline and thyroid not enlarged, symmetric, no tenderness/mass/nodules Back: symmetric, no curvature. ROM normal. No CVA tenderness. Lungs: clear to auscultation bilaterally Breasts: gyn Heart: regular rate and rhythm, S1, S2 normal, no murmur, click, rub or gallop Abdomen: soft, non-tender; bowel sounds normal; no masses,  no organomegaly Pelvic: deferred--gyn Extremities: extremities normal, atraumatic, no cyanosis or edema Pulses: 2+ and symmetric Skin: Skin color, texture, turgor  normal. No rashes or lesions Lymph nodes: Cervical, supraclavicular, and axillary nodes normal. Neurologic: Alert and oriented X 3, normal strength and tone. Normal symmetric reflexes. Normal coordination and gait psych-- no depression, no anxiety    Assessment:    Healthy female exam.      Plan:    ghm utd--colon tomorrow Check labs See After Visit Summary for Counseling Recommendations

## 2012-10-07 NOTE — Assessment & Plan Note (Signed)
Per gyn 

## 2012-10-08 ENCOUNTER — Encounter: Payer: Self-pay | Admitting: Internal Medicine

## 2012-10-08 ENCOUNTER — Ambulatory Visit (AMBULATORY_SURGERY_CENTER): Payer: 59 | Admitting: Internal Medicine

## 2012-10-08 VITALS — BP 137/73 | HR 65 | Temp 97.8°F | Resp 18 | Ht 63.0 in | Wt 153.0 lb

## 2012-10-08 DIAGNOSIS — Z86010 Personal history of colonic polyps: Secondary | ICD-10-CM

## 2012-10-08 DIAGNOSIS — D126 Benign neoplasm of colon, unspecified: Secondary | ICD-10-CM

## 2012-10-08 DIAGNOSIS — Z1211 Encounter for screening for malignant neoplasm of colon: Secondary | ICD-10-CM

## 2012-10-08 LAB — POCT URINALYSIS DIPSTICK
Blood, UA: NEGATIVE
Glucose, UA: NEGATIVE
Nitrite, UA: NEGATIVE
Urobilinogen, UA: 0.2

## 2012-10-08 MED ORDER — SODIUM CHLORIDE 0.9 % IV SOLN: 500.0000 mL | INTRAVENOUS | Status: DC

## 2012-10-08 NOTE — Op Note (Signed)
[facility] [address] [place], [address]   COLONOSCOPY PROCEDURE REPORT  PATIENT: Michelle [name], Michelle [name]  MR#: [id number] BIRTHDATE: 08-Jul-1946 , 66  yrs. old GENDER: Female ENDOSCOPIST: Roxy Cedar, MD REFERRED VH:[id number] Program Recall PROCEDURE DATE:  10/08/2012 PROCEDURE:   Colonoscopy with snare polypectomy    x 1 ASA CLASS:   Class II INDICATIONS:Patient's personal history of adenomatous colon polyps. 2005, 2008 w/ TAs MEDICATIONS: MAC sedation, administered by CRNA and propofol (Diprivan) 250mg  IV  DESCRIPTION OF PROCEDURE:   After the risks benefits and alternatives of the procedure were thoroughly explained, informed consent was obtained.  A digital rectal exam revealed no abnormalities of the rectum.   The LB [id number]  endoscope was introduced through the anus and advanced to the cecum, which was identified by both the appendix and ileocecal valve. No adverse events experienced.   The quality of the prep was good, using MoviPrep  The instrument was then slowly withdrawn as the colon was fully examined.      COLON FINDINGS: A diminutive polyp was found in the sigmoid colon. A polypectomy was performed with a cold snare.  The resection was complete and the polyp tissue was completely retrieved.   Moderate diverticulosis was noted The finding was in the left colon.   The colon mucosa was otherwise normal.  Retroflexed views revealed no abnormalities. The time to cecum=5 minutes 51 seconds.  Withdrawal time=8 minutes 47 seconds.  The scope was withdrawn and the procedure completed. COMPLICATIONS: There were no complications.  ENDOSCOPIC IMPRESSION: 1.   Diminutive polyp was found in the sigmoid colon; polypectomy was performed with a cold snare 2.   Moderate diverticulosis was noted in the left colon 3.   The colon mucosa was otherwise normal  RECOMMENDATIONS: 1. Follow up colonoscopy in 5 years   eSigned:  Roxy Cedar,  MD 10/08/2012 9:42 AM   cc: Lelon Perla, DO and The Patient   PATIENT NAME:  Michelle [name], Michelle [name] MR#: [id number]

## 2012-10-08 NOTE — Progress Notes (Signed)
Patient did not experience any of the following events: a burn prior to discharge; a fall within the facility; wrong site/side/patient/procedure/implant event; or a hospital transfer or hospital admission upon discharge from the facility. (G8907) Patient did not have preoperative order for IV antibiotic SSI prophylaxis. (G8918)  

## 2012-10-08 NOTE — Progress Notes (Signed)
0847 in pacu, vss, bbs=clear report to pacu rn

## 2012-10-08 NOTE — Patient Instructions (Addendum)

## 2012-10-08 NOTE — Progress Notes (Signed)
Called to room to assist during endoscopic procedure.  Patient ID and intended procedure confirmed with present staff. Received instructions for my participation in the procedure from the performing physician.  

## 2012-10-11 ENCOUNTER — Telehealth: Payer: Self-pay

## 2012-10-11 NOTE — Telephone Encounter (Signed)
Left message on answering machine. 

## 2012-10-12 ENCOUNTER — Encounter: Payer: Self-pay | Admitting: Internal Medicine

## 2013-01-06 ENCOUNTER — Other Ambulatory Visit: Payer: Self-pay | Admitting: Otolaryngology

## 2013-01-06 ENCOUNTER — Ambulatory Visit
Admission: RE | Admit: 2013-01-06 | Discharge: 2013-01-06 | Disposition: A | Discharge status: Home / Self Care | Payer: 59 | Source: Ambulatory Visit | Attending: Otolaryngology | Admitting: Otolaryngology

## 2013-01-06 DIAGNOSIS — R053 Chronic cough: Secondary | ICD-10-CM

## 2013-01-06 DIAGNOSIS — R05 Cough: Secondary | ICD-10-CM

## 2013-06-15 ENCOUNTER — Ambulatory Visit (INDEPENDENT_AMBULATORY_CARE_PROVIDER_SITE_OTHER): Payer: 59

## 2013-06-15 DIAGNOSIS — Z23 Encounter for immunization: Secondary | ICD-10-CM

## 2013-07-09 LAB — HM MAMMOGRAPHY: HM MAMMO: NORMAL

## 2013-10-03 ENCOUNTER — Other Ambulatory Visit: Payer: Self-pay | Admitting: Family Medicine

## 2013-10-12 ENCOUNTER — Telehealth: Payer: Self-pay

## 2013-10-12 NOTE — Telephone Encounter (Signed)
Medication and allergies:  Reviewed and updated  90 day supply/mail order: n/a Local pharmacy:  [id number] - [place], [place] - [address]   Immunizations due:  UTD   A/P: No changes to personal, family history or past surgical hx PAP- 09/06/12-normal CCS- 10/08/12- benign polys, follow-up in 5 yrs MMG- 07/2013 per pt.- normal BD- 12/16/11 Flu- 06/15/13 Tdap- 07/02/05 PNA- 09/23/11 Shingles- 11/14/08  To Discuss with Provider: Client stated that she would like to speak to the provider about changing her Amlodipine to different medication due to persistent cough.

## 2013-10-14 ENCOUNTER — Other Ambulatory Visit (HOSPITAL_COMMUNITY)
Admission: RE | Admit: 2013-10-14 | Discharge: 2013-10-14 | Disposition: A | Discharge status: Home / Self Care | Payer: Medicare HMO | Source: Ambulatory Visit | Attending: Family Medicine | Admitting: Family Medicine

## 2013-10-14 ENCOUNTER — Ambulatory Visit (INDEPENDENT_AMBULATORY_CARE_PROVIDER_SITE_OTHER): Payer: Medicare HMO | Admitting: Family Medicine

## 2013-10-14 ENCOUNTER — Encounter: Payer: Self-pay | Admitting: Family Medicine

## 2013-10-14 VITALS — BP 138/63 | HR 72 | Temp 97.4°F | Ht 62.5 in | Wt 157.0 lb

## 2013-10-14 DIAGNOSIS — E039 Hypothyroidism, unspecified: Secondary | ICD-10-CM

## 2013-10-14 DIAGNOSIS — Z124 Encounter for screening for malignant neoplasm of cervix: Secondary | ICD-10-CM | POA: Insufficient documentation

## 2013-10-14 DIAGNOSIS — I10 Essential (primary) hypertension: Secondary | ICD-10-CM

## 2013-10-14 DIAGNOSIS — R059 Cough, unspecified: Secondary | ICD-10-CM

## 2013-10-14 DIAGNOSIS — E785 Hyperlipidemia, unspecified: Secondary | ICD-10-CM

## 2013-10-14 DIAGNOSIS — Z0000 Encounter for general adult medical examination without abnormal findings: Secondary | ICD-10-CM

## 2013-10-14 DIAGNOSIS — R05 Cough: Secondary | ICD-10-CM

## 2013-10-14 DIAGNOSIS — Z1151 Encounter for screening for human papillomavirus (HPV): Secondary | ICD-10-CM | POA: Insufficient documentation

## 2013-10-14 LAB — POCT URINALYSIS DIPSTICK
BILIRUBIN UA: NEGATIVE
Blood, UA: NEGATIVE
Glucose, UA: NEGATIVE
KETONES UA: NEGATIVE
LEUKOCYTES UA: NEGATIVE
Nitrite, UA: NEGATIVE
PH UA: 7.5
Protein, UA: NEGATIVE
Spec Grav, UA: 1.01
Urobilinogen, UA: 0.2

## 2013-10-14 LAB — CBC WITH DIFFERENTIAL/PLATELET
BASOS PCT: 0.5 % (ref 0.0–3.0)
Basophils Absolute: 0 10*3/uL (ref 0.0–0.1)
EOS ABS: 0.2 10*3/uL (ref 0.0–0.7)
Eosinophils Relative: 2.6 % (ref 0.0–5.0)
HEMATOCRIT: 37.3 % (ref 36.0–46.0)
HEMOGLOBIN: 12.2 g/dL (ref 12.0–15.0)
LYMPHS ABS: 1.6 10*3/uL (ref 0.7–4.0)
LYMPHS PCT: 25.7 % (ref 12.0–46.0)
MCHC: 32.7 g/dL (ref 30.0–36.0)
MCV: 86.9 fl (ref 78.0–100.0)
MONO ABS: 0.5 10*3/uL (ref 0.1–1.0)
Monocytes Relative: 8.6 % (ref 3.0–12.0)
NEUTROS ABS: 3.9 10*3/uL (ref 1.4–7.7)
Neutrophils Relative %: 62.6 % (ref 43.0–77.0)
Platelets: 249 10*3/uL (ref 150.0–400.0)
RBC: 4.3 Mil/uL (ref 3.87–5.11)
RDW: 12.8 % (ref 11.5–14.6)
WBC: 6.3 10*3/uL (ref 4.5–10.5)

## 2013-10-14 MED ORDER — HYDROCOD POLST-CPM POLST ER 10-8 MG PO CP12: ORAL_CAPSULE | ORAL | Status: DC

## 2013-10-14 MED ORDER — BENZONATATE 200 MG PO CAPS: 200.0000 mg | ORAL_CAPSULE | Freq: Two times a day (BID) | ORAL | Status: DC | PRN

## 2013-10-14 NOTE — Progress Notes (Signed)
Subjective:    Michelle [name] is a 68 y.o. female who presents for Medicare Annual/Subsequent preventive examination.  Preventive Screening-Counseling & Management  Tobacco History  Smoking status  . Never Smoker   Smokeless tobacco  . Never Used     Problems Prior to Visit 1. none  Current Problems (verified) Patient Active Problem List   Diagnosis Date Noted  . POSTMENOPAUSAL STATUS 07/26/2009  . SINUSITIS, ACUTE 01/17/2009  . INTESTINAL GAS 09/18/2008  . ONYCHOMYCOSIS, TOENAILS 08/14/2008  . GLAUCOMA 07/24/2008  . HYPERTENSION 07/24/2008  . ACUTE FRONTAL SINUSITIS 03/24/2008  . MYALGIA 02/29/2008  . COUGH 07/26/2007  . ADENOMATOUS COLONIC POLYP 05/20/2007  . DIVERTICULOSIS, COLON 05/20/2007  . HYPOTHYROIDISM 04/07/2007  . HYPERLIPIDEMIA 04/07/2007  . GERD 04/07/2007  . IBS 04/07/2007    Medications Prior to Visit Current Outpatient Prescriptions on File Prior to Visit  Medication Sig Dispense Refill  . amLODipine (NORVASC) 5 MG tablet Take 1 tablet (5 mg total) by mouth daily.  90 tablet  3  . aspirin 81 MG tablet Take 160 mg by mouth daily.      . Biotin 1 MG CAPS Take by mouth. Take 1-2 tabs daily      . calcium carbonate (OS-CAL) 600 MG TABS Take 600 mg by mouth 2 (two) times daily with a meal.      . cetirizine (ZYRTEC) 10 MG tablet Take 10 mg by mouth as needed.       Marland Kitchen esomeprazole (NEXIUM) 20 MG capsule Take 22.3 mg by mouth daily.      Marland Kitchen levothyroxine (SYNTHROID, LEVOTHROID) 50 MCG tablet Take 1 tablet by mouth daily.      . meloxicam (MOBIC) 15 MG tablet Take 15 mg by mouth as needed.       . Multiple Vitamin (MULTIVITAMIN) tablet Take 1 tablet by mouth daily.      . pravastatin (PRAVACHOL) 40 MG tablet 1 tab by mouth at bedtime--repeat labs are due now  90 tablet  0  . Probiotic Product (ALIGN) 4 MG CAPS Take 1 capsule by mouth daily.      . Travoprost, BAK Free, (TRAVATAN) 0.004 % SOLN ophthalmic solution Place 1 drop into both eyes at bedtime.        Current Facility-Administered Medications on File Prior to Visit  Medication Dose Route Frequency Provider Last Rate Last Dose  . pneumococcal 23 valent vaccine (PNU-IMMUNE) injection 0.5 mL  0.5 mL Intramuscular Once Rosalita Chessman, DO        Current Medications (verified) Current Outpatient Prescriptions  Medication Sig Dispense Refill  . amLODipine (NORVASC) 5 MG tablet Take 1 tablet (5 mg total) by mouth daily.  90 tablet  3  . aspirin 81 MG tablet Take 160 mg by mouth daily.      . Biotin 1 MG CAPS Take by mouth. Take 1-2 tabs daily      . calcium carbonate (OS-CAL) 600 MG TABS Take 600 mg by mouth 2 (two) times daily with a meal.      . cetirizine (ZYRTEC) 10 MG tablet Take 10 mg by mouth as needed.       Marland Kitchen esomeprazole (NEXIUM) 20 MG capsule Take 22.3 mg by mouth daily.      Marland Kitchen levothyroxine (SYNTHROID, LEVOTHROID) 50 MCG tablet Take 1 tablet by mouth daily.      . meloxicam (MOBIC) 15 MG tablet Take 15 mg by mouth as needed.       . Multiple Vitamin (MULTIVITAMIN) tablet Take  1 tablet by mouth daily.      . pravastatin (PRAVACHOL) 40 MG tablet 1 tab by mouth at bedtime--repeat labs are due now  90 tablet  0  . Probiotic Product (ALIGN) 4 MG CAPS Take 1 capsule by mouth daily.      . Travoprost, BAK Free, (TRAVATAN) 0.004 % SOLN ophthalmic solution Place 1 drop into both eyes at bedtime.       Current Facility-Administered Medications  Medication Dose Route Frequency Provider Last Rate Last Dose  . pneumococcal 23 valent vaccine (PNU-IMMUNE) injection 0.5 mL  0.5 mL Intramuscular Once Rosalita Chessman, DO         Allergies (verified) Review of patient's allergies indicates no known allergies.   PAST HISTORY  Family History Family History  Problem Relation Age of Onset  . Macular degeneration Father   . Prostate cancer Father   . Hypertension Father   . Cancer Father     prostate  . Glaucoma Mother   . Emphysema Mother   . Hypertension Mother   . Other Brother      Brain tumor  . Cancer Brother 56    brain tumor  . Breast cancer Maternal Grandmother   . Prostate cancer Brother     Social History History  Substance Use Topics  . Smoking status: Never Smoker   . Smokeless tobacco: Never Used  . Alcohol Use: No     Are there smokers in your home (other than you)? No  Risk Factors Current exercise habits: The patient does not participate in regular exercise at present.  Dietary issues discussed: na   Cardiac risk factors: advanced age (older than 52 for men, 12 for women), dyslipidemia, hypertension and sedentary lifestyle.  Depression Screen (Note: if answer to either of the following is "Yes", a more complete depression screening is indicated)   Over the past two weeks, have you felt down, depressed or hopeless? No  Over the past two weeks, have you felt little interest or pleasure in doing things? No  Have you lost interest or pleasure in daily life? No  Do you often feel hopeless? No  Do you cry easily over simple problems? No  Activities of Daily Living In your present state of health, do you have any difficulty performing the following activities?:  Driving? No Managing money?  No Feeding yourself? No Getting from bed to chair? No Climbing a flight of stairs? No Preparing food and eating?: No Bathing or showering? No Getting dressed: No Getting to the toilet? No Using the toilet:No Moving around from place to place: No In the past year have you fallen or had a near fall?:No   Are you sexually active?  Yes  Do you have more than one partner?  No  Hearing Difficulties: Yes---hearing aids Do you often ask people to speak up or repeat themselves? Yes Do you experience ringing or noises in your ears? No Do you have difficulty understanding soft or whispered voices? Yes   Do you feel that you have a problem with memory? No  Do you often misplace items? No  Do you feel safe at home?  Yes  Cognitive Testing  Alert? Yes   Normal Appearance?Yes  Oriented to person? Yes  Place? Yes   Time? Yes  Recall of three objects?  Yes  Can perform simple calculations? Yes  Displays appropriate judgment?Yes  Can read the correct time from a watch face?Yes   Advanced Directives have been discussed with the patient?  Yes  List the Names of Other Physician/Practitioners you currently use: 1.  opth- digby 2/  Dentist--cates 3. peridon-- Eddie 4.  GI--Perry 5.  pulm-- clance 6.  Allergy--hicks 7.  ENT--crossley 8.  Gyn-- Chuck Hint any recent Medical Services you may have received from other than [facility] providers in the past year (date may be approximate).  Immunization History  Administered Date(s) Administered  . Influenza Split 09/23/2011  . Influenza Whole 07/26/2007, 06/30/2008, 07/26/2009, 09/12/2010  . Influenza, High Dose Seasonal PF 06/15/2013  . Pneumococcal Polysaccharide-23 09/23/2011  . Td 07/02/2005  . Zoster 11/14/2008    Screening Tests Health Maintenance  Topic Date Due  . Influenza Vaccine  04/08/2014  . Pap Smear  09/06/2014  . Tetanus/tdap  07/03/2015  . Mammogram  07/10/2015  . Colonoscopy  10/08/2017  . Pneumococcal Polysaccharide Vaccine Age 12 And Over  Completed  . Zostavax  Completed    All answers were reviewed with the patient and necessary referrals were made:  Garnet Koyanagi, DO   10/14/2013   History reviewed:  She  has a past medical history of GERD (gastroesophageal reflux disease); Hyperlipidemia; Thyroid disease; Hypertension; IBS (irritable bowel syndrome); Adenomatous colon polyp; Glaucoma; Allergy; Low back pain; and hearing loss. She  does not have any pertinent problems on file. She  has past surgical history that includes Tubal ligation (1977); Cholecystectomy (1998); Abdominal hysterectomy (1998); and Gum surgery (1996 / 2013). Her family history includes Breast cancer in her maternal grandmother; Cancer in her father; Cancer (age of onset: 69) in her brother;  Emphysema in her mother; Glaucoma in her mother; Hypertension in her father and mother; Macular degeneration in her father; Other in her brother; Prostate cancer in her brother and father. She  reports that she has never smoked. She has never used smokeless tobacco. She reports that she does not drink alcohol or use illicit drugs. She has a current medication list which includes the following prescription(s): amlodipine, aspirin, biotin, calcium carbonate, cetirizine, esomeprazole, levothyroxine, meloxicam, multivitamin, pravastatin, align, and travoprost (bak free), and the following Facility-Administered Medications: pneumococcal 23 valent vaccine. Current Outpatient Prescriptions on File Prior to Visit  Medication Sig Dispense Refill  . amLODipine (NORVASC) 5 MG tablet Take 1 tablet (5 mg total) by mouth daily.  90 tablet  3  . aspirin 81 MG tablet Take 160 mg by mouth daily.      . Biotin 1 MG CAPS Take by mouth. Take 1-2 tabs daily      . calcium carbonate (OS-CAL) 600 MG TABS Take 600 mg by mouth 2 (two) times daily with a meal.      . cetirizine (ZYRTEC) 10 MG tablet Take 10 mg by mouth as needed.       Marland Kitchen esomeprazole (NEXIUM) 20 MG capsule Take 22.3 mg by mouth daily.      Marland Kitchen levothyroxine (SYNTHROID, LEVOTHROID) 50 MCG tablet Take 1 tablet by mouth daily.      . meloxicam (MOBIC) 15 MG tablet Take 15 mg by mouth as needed.       . Multiple Vitamin (MULTIVITAMIN) tablet Take 1 tablet by mouth daily.      . pravastatin (PRAVACHOL) 40 MG tablet 1 tab by mouth at bedtime--repeat labs are due now  90 tablet  0  . Probiotic Product (ALIGN) 4 MG CAPS Take 1 capsule by mouth daily.      . Travoprost, BAK Free, (TRAVATAN) 0.004 % SOLN ophthalmic solution Place 1 drop into both eyes at bedtime.  Current Facility-Administered Medications on File Prior to Visit  Medication Dose Route Frequency Provider Last Rate Last Dose  . pneumococcal 23 valent vaccine (PNU-IMMUNE) injection 0.5 mL  0.5 mL  Intramuscular Once Lelon Perla, DO       She has No Known Allergies.  Review of Systems  Review of Systems  Constitutional: Negative for activity change, appetite change and fatigue.  HENT: Negative for hearing loss, congestion, tinnitus and ear discharge.   Eyes: Negative for visual disturbance (see optho q1y --) Respiratory: Negative for cough, chest tightness and shortness of breath.   Cardiovascular: Negative for chest pain, palpitations and leg swelling.  Gastrointestinal: Negative for abdominal pain, diarrhea, constipation and abdominal distention.  Genitourinary: Negative for urgency, frequency, decreased urine volume and difficulty urinating.  Musculoskeletal: Negative for back pain, arthralgias and gait problem.  Skin: Negative for color change, pallor and rash.  Neurological: Negative for dizziness, light-headedness, numbness and headaches.  Hematological: Negative for adenopathy. Does not bruise/bleed easily.  Psychiatric/Behavioral: Negative for suicidal ideas, confusion, sleep disturbance, self-injury, dysphoric mood, decreased concentration and agitation.  Pt is able to read and write and can do all ADLs No risk for falling No abuse/ violence in home      Objective:     Vision by Snellen chart: opth Body mass index is 28.24 kg/(m^2). BP 138/63  Pulse 72  Temp(Src) 97.4 F (36.3 C) (Oral)  Ht 5' 2.5" (1.588 m)  Wt 157 lb (71.215 kg)  BMI 28.24 kg/m2  SpO2 100%  BP 138/63  Pulse 72  Temp(Src) 97.4 F (36.3 C) (Oral)  Ht 5' 2.5" (1.588 m)  Wt 157 lb (71.215 kg)  BMI 28.24 kg/m2  SpO2 100% General appearance: alert, cooperative, appears stated age and no distress Head: Normocephalic, without obvious abnormality, atraumatic Eyes: conjunctivae/corneas clear. PERRL, EOM&#39;s intact. Fundi benign. Ears: normal TM's and external ear canals both ears and normal TM&#39;s and external ear canals both ears Nose: Nares normal. Septum midline. Mucosa normal. No  drainage or sinus tenderness. Throat: lips, mucosa, and tongue normal; teeth and gums normal Neck: no adenopathy, no carotid bruit, no JVD, supple, symmetrical, trachea midline and thyroid not enlarged, symmetric, no tenderness/mass/nodules Back: symmetric, no curvature. ROM normal. No CVA tenderness. Lungs: clear to auscultation bilaterally Breasts: gyn Heart: regular rate and rhythm, S1, S2 normal, no murmur, click, rub or gallop Abdomen: soft, non-tender; bowel sounds normal; no masses,  no organomegaly Pelvic: deferred-gyn Extremities: extremities normal, atraumatic, no cyanosis or edema Pulses: 2+ and symmetric Skin: Skin color, texture, turgor normal. No rashes or lesions Lymph nodes: Cervical, supraclavicular, and axillary nodes normal. Neurologic: Alert and oriented X 3, normal strength and tone. Normal symmetric reflexes. Normal coordination and gait Psych-- no depression, no anxiety      Assessment:     cpe     Plan:    ghm utd Check labs During the course of the visit the patient was educated and counseled about appropriate screening and preventive services including:    Pneumococcal vaccine   Influenza vaccine  Screening mammography  Screening Pap smear and pelvic exam   Bone densitometry screening  Colorectal cancer screening  Diabetes screening  Glaucoma screening  Advanced directives: has an advanced directive - a copy HAS NOT been provided.  Diet review for nutrition referral? Yes ____  Not Indicated __x_   Patient Instructions (the written plan) was given to the patient.  Medicare Attestation I have personally reviewed: The patient's medical and social history Their use  of alcohol, tobacco or illicit drugs Their current medications and supplements The patient's functional ability including ADLs,fall risks, home safety risks, cognitive, and hearing and visual impairment Diet and physical activities Evidence for depression or mood  disorders  The patient's weight, height, BMI, and visual acuity have been recorded in the chart.  I have made referrals, counseling, and provided education to the patient based on review of the above and I have provided the patient with a written personalized care plan for preventive services.     Garnet Koyanagi, DO   10/14/2013

## 2013-10-14 NOTE — Assessment & Plan Note (Signed)
Refill meds Try nexium bid F/u pulm

## 2013-10-14 NOTE — Patient Instructions (Signed)

## 2013-10-14 NOTE — Assessment & Plan Note (Signed)
Check labs con't meds 

## 2013-10-14 NOTE — Assessment & Plan Note (Signed)
Stable con't meds 

## 2013-10-14 NOTE — Progress Notes (Signed)
Pre visit review using our clinic review tool, if applicable. No additional management support is needed unless otherwise documented below in the visit note. 

## 2013-10-17 ENCOUNTER — Telehealth: Payer: Self-pay | Admitting: Family Medicine

## 2013-10-17 LAB — BASIC METABOLIC PANEL
BUN: 10 mg/dL (ref 6–23)
CO2: 30 mEq/L (ref 19–32)
Calcium: 9.1 mg/dL (ref 8.4–10.5)
Chloride: 104 mEq/L (ref 96–112)
Creatinine, Ser: 0.7 mg/dL (ref 0.4–1.2)
GFR: 94.75 mL/min (ref >60.00)
GLUCOSE: 73 mg/dL (ref 70–99)
POTASSIUM: 4.2 meq/L (ref 3.5–5.1)
SODIUM: 140 meq/L (ref 135–145)

## 2013-10-17 LAB — HEPATIC FUNCTION PANEL
ALBUMIN: 4.2 g/dL (ref 3.5–5.2)
ALT: 17 U/L (ref 0–35)
AST: 24 U/L (ref 0–37)
Alkaline Phosphatase: 58 U/L (ref 39–117)
Bilirubin, Direct: 0 mg/dL (ref 0.0–0.3)
TOTAL PROTEIN: 7.2 g/dL (ref 6.0–8.3)
Total Bilirubin: 0.6 mg/dL (ref 0.3–1.2)

## 2013-10-17 LAB — LIPID PANEL
CHOLESTEROL: 135 mg/dL (ref 0–200)
HDL: 58.4 mg/dL (ref >39.00)
LDL Cholesterol: 62 mg/dL (ref 0–99)
Total CHOL/HDL Ratio: 2
Triglycerides: 75 mg/dL (ref 0.0–149.0)
VLDL: 15 mg/dL (ref 0.0–40.0)

## 2013-10-17 NOTE — Telephone Encounter (Signed)
Relevant patient education assigned to patient using Emmi. ° °

## 2013-10-18 ENCOUNTER — Telehealth: Payer: Self-pay

## 2013-10-18 DIAGNOSIS — R059 Cough, unspecified: Secondary | ICD-10-CM

## 2013-10-18 DIAGNOSIS — R05 Cough: Secondary | ICD-10-CM

## 2013-10-18 MED ORDER — HYDROCOD POLST-CPM POLST ER 10-8 MG PO CP12: ORAL_CAPSULE | ORAL | Status: DC

## 2013-10-18 NOTE — Telephone Encounter (Addendum)
Patient presents to the lobby with Rx for tussicaps. States that her insurance will not cover and it will cost $300+. She has a discount coupon that she can get it for $25 but it has to be a quantity of at least 20. Patient would like a new Rx.  New Rx printed per Dr Etter Sjogren and placed at front desk.

## 2014-01-06 ENCOUNTER — Other Ambulatory Visit: Payer: Self-pay | Admitting: Family Medicine

## 2014-02-23 ENCOUNTER — Other Ambulatory Visit: Payer: Self-pay | Admitting: Family Medicine

## 2014-04-13 ENCOUNTER — Ambulatory Visit: Payer: Medicare HMO | Admitting: Family Medicine

## 2014-04-14 ENCOUNTER — Ambulatory Visit: Payer: Medicare HMO | Admitting: Family Medicine

## 2014-04-26 ENCOUNTER — Encounter: Payer: Self-pay | Admitting: Family Medicine

## 2014-04-26 ENCOUNTER — Ambulatory Visit (INDEPENDENT_AMBULATORY_CARE_PROVIDER_SITE_OTHER): Payer: Medicare HMO | Admitting: Family Medicine

## 2014-04-26 VITALS — BP 124/70 | HR 66 | Temp 98.6°F | Wt 154.0 lb

## 2014-04-26 DIAGNOSIS — E785 Hyperlipidemia, unspecified: Secondary | ICD-10-CM

## 2014-04-26 DIAGNOSIS — R05 Cough: Secondary | ICD-10-CM

## 2014-04-26 DIAGNOSIS — R059 Cough, unspecified: Secondary | ICD-10-CM

## 2014-04-26 DIAGNOSIS — I10 Essential (primary) hypertension: Secondary | ICD-10-CM

## 2014-04-26 LAB — HEPATIC FUNCTION PANEL
ALBUMIN: 4 g/dL (ref 3.5–5.2)
ALT: 16 U/L (ref 0–35)
AST: 22 U/L (ref 0–37)
Alkaline Phosphatase: 62 U/L (ref 39–117)
Bilirubin, Direct: 0 mg/dL (ref 0.0–0.3)
TOTAL PROTEIN: 7.1 g/dL (ref 6.0–8.3)
Total Bilirubin: 0.6 mg/dL (ref 0.2–1.2)

## 2014-04-26 LAB — BASIC METABOLIC PANEL
BUN: 13 mg/dL (ref 6–23)
CO2: 28 mEq/L (ref 19–32)
Calcium: 9.1 mg/dL (ref 8.4–10.5)
Chloride: 105 mEq/L (ref 96–112)
Creatinine, Ser: 0.6 mg/dL (ref 0.4–1.2)
GFR: 105.61 mL/min (ref >60.00)
GLUCOSE: 83 mg/dL (ref 70–99)
Potassium: 3.9 mEq/L (ref 3.5–5.1)
Sodium: 140 mEq/L (ref 135–145)

## 2014-04-26 LAB — LIPID PANEL
CHOLESTEROL: 128 mg/dL (ref 0–200)
HDL: 66 mg/dL (ref >39.00)
LDL Cholesterol: 49 mg/dL (ref 0–99)
NONHDL: 62
Total CHOL/HDL Ratio: 2
Triglycerides: 66 mg/dL (ref 0.0–149.0)
VLDL: 13.2 mg/dL (ref 0.0–40.0)

## 2014-04-26 MED ORDER — BENZONATATE 200 MG PO CAPS: 200.0000 mg | ORAL_CAPSULE | Freq: Two times a day (BID) | ORAL | Status: DC | PRN

## 2014-04-26 MED ORDER — HYDROCOD POLST-CPM POLST ER 10-8 MG PO CP12
ORAL_CAPSULE | ORAL | Status: DC
Start: 2014-04-26 — End: 2014-11-03

## 2014-04-26 NOTE — Progress Notes (Signed)
Pre visit review using our clinic review tool, if applicable. No additional management support is needed unless otherwise documented below in the visit note. 

## 2014-04-26 NOTE — Patient Instructions (Signed)

## 2014-04-26 NOTE — Progress Notes (Signed)
  Subjective:    Patient here for follow-up of elevated blood pressure.  She is not exercising and is adherent to a low-salt diet.  Blood pressure is well controlled at home. Cardiac symptoms: none. Patient denies: chest pain, chest pressure/discomfort, claudication, dyspnea, exertional chest pressure/discomfort, fatigue, irregular heart beat, lower extremity edema, near-syncope, orthopnea, palpitations, paroxysmal nocturnal dyspnea, syncope and tachypnea. Cardiovascular risk factors: advanced age (older than 53 for men, 76 for women), dyslipidemia, hypertension and sedentary lifestyle. Use of agents associated with hypertension: none. History of target organ damage: none.  The following portions of the patient's history were reviewed and updated as appropriate: allergies, current medications, past family history, past medical history, past social history, past surgical history and problem list.  Review of Systems Pertinent items are noted in HPI.     Objective:    BP 124/70  Pulse 66  Temp(Src) 98.6 F (37 C) (Oral)  Wt 154 lb (69.854 kg)  SpO2 98% General appearance: alert, cooperative, appears stated age and no distress Head: Normocephalic, without obvious abnormality, atraumatic Neck: no adenopathy, no carotid bruit, no JVD, supple, symmetrical, trachea midline and thyroid not enlarged, symmetric, no tenderness/mass/nodules Lungs: clear to auscultation bilaterally Heart: S1, S2 normal Extremities: extremities normal, atraumatic, no cyanosis or edema    Assessment:    Hypertension, normal blood pressure . Evidence of target organ damage: none.    Plan:    Medication: no change. Dietary sodium restriction. Regular aerobic exercise. Follow up: 6 months and as needed.   1. Essential hypertension  - Basic metabolic panel  2. Other and unspecified hyperlipidemia  - Hepatic function panel - Lipid panel  3. Cough Chronic-- pt will try meds given to her by pulm a few years  ago with vocal rest - benzonatate (TESSALON) 200 MG capsule; Take 1 capsule (200 mg total) by mouth 2 (two) times daily as needed for cough.  Dispense: 20 capsule; Refill: 0 - Hydrocod Polst-Chlorphen Polst (TUSSICAPS) 10-8 MG CP12; 1 po q12h prn cough  Dispense: 20 each; Refill: 0

## 2014-06-09 ENCOUNTER — Telehealth: Payer: Self-pay | Admitting: *Deleted

## 2014-06-09 NOTE — Telephone Encounter (Signed)
PA for Amlodipine initiated on [email or web address]. Awaiting determination. JG//CMA

## 2014-06-19 ENCOUNTER — Ambulatory Visit (INDEPENDENT_AMBULATORY_CARE_PROVIDER_SITE_OTHER): Payer: Medicare HMO

## 2014-06-19 DIAGNOSIS — Z23 Encounter for immunization: Secondary | ICD-10-CM

## 2014-07-25 ENCOUNTER — Other Ambulatory Visit: Payer: Self-pay | Admitting: Family Medicine

## 2014-09-02 ENCOUNTER — Other Ambulatory Visit: Payer: Self-pay | Admitting: Family Medicine

## 2014-11-02 ENCOUNTER — Telehealth: Payer: Self-pay

## 2014-11-02 NOTE — Telephone Encounter (Signed)
Medication: up to date  Preferred Pharmacy and which med where: [facility] [place]- 1 day supply     Allergies verified: no new allergies   Immunization Status: Prompted for insurance verification:  Flu vaccine--10.12.2015 Tdap-- 10.25.2006 PNA--1.15.2013 Shingles-- 2.9.2010  A/P:   Changes to [place], [place] or Personal Hx: no new updates  Pap-- [facility]-- November 2015 at [place]-- 4.15.2013  CCS-- 1.31.2014 due every 5 years  Care Teams Updated: Eye:  Dr. Calvert Cantor; Dentist: Dr. Geanie Logan; Audiologist: [facility] ED/Hospital/Urgent Care Visits: None Prompted for: Updated insurance, contact information, forms: Remind to bring: DPR information, advance directives:   To Discuss with Provider: possible sinus infection, and the possibility of Nexium causing dementia.  Pt wants to discuss Dr. Etter Sjogren refilling Levothyroxine due to GYN retiring.

## 2014-11-03 ENCOUNTER — Ambulatory Visit (INDEPENDENT_AMBULATORY_CARE_PROVIDER_SITE_OTHER): Payer: Medicare HMO | Admitting: Family Medicine

## 2014-11-03 ENCOUNTER — Encounter: Payer: Self-pay | Admitting: Family Medicine

## 2014-11-03 ENCOUNTER — Encounter: Payer: Medicare HMO | Admitting: Family Medicine

## 2014-11-03 VITALS — BP 120/67 | HR 67 | Temp 97.8°F | Ht 62.75 in | Wt 153.0 lb

## 2014-11-03 DIAGNOSIS — E2839 Other primary ovarian failure: Secondary | ICD-10-CM

## 2014-11-03 DIAGNOSIS — Z23 Encounter for immunization: Secondary | ICD-10-CM

## 2014-11-03 DIAGNOSIS — I10 Essential (primary) hypertension: Secondary | ICD-10-CM | POA: Diagnosis not present

## 2014-11-03 DIAGNOSIS — N390 Urinary tract infection, site not specified: Secondary | ICD-10-CM | POA: Diagnosis not present

## 2014-11-03 DIAGNOSIS — Z0000 Encounter for general adult medical examination without abnormal findings: Secondary | ICD-10-CM

## 2014-11-03 DIAGNOSIS — E039 Hypothyroidism, unspecified: Secondary | ICD-10-CM

## 2014-11-03 DIAGNOSIS — E785 Hyperlipidemia, unspecified: Secondary | ICD-10-CM

## 2014-11-03 DIAGNOSIS — J0110 Acute frontal sinusitis, unspecified: Secondary | ICD-10-CM

## 2014-11-03 DIAGNOSIS — T753XXA Motion sickness, initial encounter: Secondary | ICD-10-CM

## 2014-11-03 DIAGNOSIS — R82998 Other abnormal findings in urine: Secondary | ICD-10-CM

## 2014-11-03 LAB — LIPID PANEL
CHOLESTEROL: 136 mg/dL (ref 0–200)
HDL: 67.3 mg/dL (ref >39.00)
LDL Cholesterol: 58 mg/dL (ref 0–99)
NonHDL: 68.7
TRIGLYCERIDES: 52 mg/dL (ref 0.0–149.0)
Total CHOL/HDL Ratio: 2
VLDL: 10.4 mg/dL (ref 0.0–40.0)

## 2014-11-03 LAB — BASIC METABOLIC PANEL
BUN: 14 mg/dL (ref 6–23)
CALCIUM: 9.5 mg/dL (ref 8.4–10.5)
CO2: 32 mEq/L (ref 19–32)
CREATININE: 0.78 mg/dL (ref 0.40–1.20)
Chloride: 105 mEq/L (ref 96–112)
GFR: 77.9 mL/min (ref >60.00)
Glucose, Bld: 94 mg/dL (ref 70–99)
Potassium: 4.6 mEq/L (ref 3.5–5.1)
SODIUM: 140 meq/L (ref 135–145)

## 2014-11-03 LAB — CBC WITH DIFFERENTIAL/PLATELET
BASOS ABS: 0 10*3/uL (ref 0.0–0.1)
Basophils Relative: 0.5 % (ref 0.0–3.0)
Eosinophils Absolute: 0.2 10*3/uL (ref 0.0–0.7)
Eosinophils Relative: 2.9 % (ref 0.0–5.0)
HCT: 36.5 % (ref 36.0–46.0)
Hemoglobin: 12.4 g/dL (ref 12.0–15.0)
LYMPHS ABS: 1.7 10*3/uL (ref 0.7–4.0)
LYMPHS PCT: 27.7 % (ref 12.0–46.0)
MCHC: 34 g/dL (ref 30.0–36.0)
MCV: 84.4 fl (ref 78.0–100.0)
Monocytes Absolute: 0.5 10*3/uL (ref 0.1–1.0)
Monocytes Relative: 8.2 % (ref 3.0–12.0)
NEUTROS ABS: 3.7 10*3/uL (ref 1.4–7.7)
NEUTROS PCT: 60.7 % (ref 43.0–77.0)
PLATELETS: 256 10*3/uL (ref 150.0–400.0)
RBC: 4.32 Mil/uL (ref 3.87–5.11)
RDW: 12.9 % (ref 11.5–15.5)
WBC: 6.1 10*3/uL (ref 4.0–10.5)

## 2014-11-03 LAB — POCT URINALYSIS DIPSTICK
Blood, UA: NEGATIVE
Glucose, UA: NEGATIVE
KETONES UA: NEGATIVE
Nitrite, UA: NEGATIVE
PH UA: 6
Protein, UA: NEGATIVE
Urobilinogen, UA: 2

## 2014-11-03 LAB — MICROALBUMIN / CREATININE URINE RATIO
Creatinine,U: 188.7 mg/dL
MICROALB/CREAT RATIO: 0.5 mg/g (ref 0.0–30.0)
Microalb, Ur: 1 mg/dL (ref 0.0–1.9)

## 2014-11-03 LAB — HEPATIC FUNCTION PANEL
ALK PHOS: 66 U/L (ref 39–117)
ALT: 14 U/L (ref 0–35)
AST: 18 U/L (ref 0–37)
Albumin: 4.3 g/dL (ref 3.5–5.2)
BILIRUBIN DIRECT: 0.1 mg/dL (ref 0.0–0.3)
BILIRUBIN TOTAL: 0.4 mg/dL (ref 0.2–1.2)
Total Protein: 7.1 g/dL (ref 6.0–8.3)

## 2014-11-03 LAB — TSH: TSH: 3.16 u[IU]/mL (ref 0.35–4.50)

## 2014-11-03 MED ORDER — CEFUROXIME AXETIL 500 MG PO TABS: 500.0000 mg | ORAL_TABLET | Freq: Two times a day (BID) | ORAL | Status: AC

## 2014-11-03 MED ORDER — FLUTICASONE PROPIONATE 50 MCG/ACT NA SUSP: 2.0000 | Freq: Every day | NASAL | Status: DC

## 2014-11-03 MED ORDER — SCOPOLAMINE 1 MG/3DAYS TD PT72: 1.0000 | MEDICATED_PATCH | TRANSDERMAL | Status: DC

## 2014-11-03 MED ORDER — VALSARTAN 160 MG PO TABS: 160.0000 mg | ORAL_TABLET | Freq: Every day | ORAL | Status: DC

## 2014-11-03 MED ORDER — PRAVASTATIN SODIUM 40 MG PO TABS: ORAL_TABLET | ORAL | Status: DC

## 2014-11-03 NOTE — Patient Instructions (Signed)
Preventive Care for Adults A healthy lifestyle and preventive care can promote health and wellness. Preventive health guidelines for women include the following key practices.  A routine yearly physical is a good way to check with your health care provider about your health and preventive screening. It is a chance to share any concerns and updates on your health and to receive a thorough exam.  Visit your dentist for a routine exam and preventive care every 6 months. Brush your teeth twice a day and floss once a day. Good oral hygiene prevents tooth decay and gum disease.  The frequency of eye exams is based on your age, health, family medical history, use of contact lenses, and other factors. Follow your health care provider's recommendations for frequency of eye exams.  Eat a healthy diet. Foods like vegetables, fruits, whole grains, low-fat dairy products, and lean protein foods contain the nutrients you need without too many calories. Decrease your intake of foods high in solid fats, added sugars, and salt. Eat the right amount of calories for you.Get information about a proper diet from your health care provider, if necessary.  Regular physical exercise is one of the most important things you can do for your health. Most adults should get at least 150 minutes of moderate-intensity exercise (any activity that increases your heart rate and causes you to sweat) each week. In addition, most adults need muscle-strengthening exercises on 2 or more days a week.  Maintain a healthy weight. The body mass index (BMI) is a screening tool to identify possible weight problems. It provides an estimate of body fat based on height and weight. Your health care provider can find your BMI and can help you achieve or maintain a healthy weight.For adults 20 years and older:  A BMI below 18.5 is considered underweight.  A BMI of 18.5 to 24.9 is normal.  A BMI of 25 to 29.9 is considered overweight.  A BMI of  30 and above is considered obese.  Maintain normal blood lipids and cholesterol levels by exercising and minimizing your intake of saturated fat. Eat a balanced diet with plenty of fruit and vegetables. Blood tests for lipids and cholesterol should begin at age 76 and be repeated every 5 years. If your lipid or cholesterol levels are high, you are over 50, or you are at high risk for heart disease, you may need your cholesterol levels checked more frequently.Ongoing high lipid and cholesterol levels should be treated with medicines if diet and exercise are not working.  If you smoke, find out from your health care provider how to quit. If you do not use tobacco, do not start.  Lung cancer screening is recommended for adults aged 22-80 years who are at high risk for developing lung cancer because of a history of smoking. A yearly low-dose CT scan of the lungs is recommended for people who have at least a 30-pack-year history of smoking and are a current smoker or have quit within the past 15 years. A pack year of smoking is smoking an average of 1 pack of cigarettes a day for 1 year (for example: 1 pack a day for 30 years or 2 packs a day for 15 years). Yearly screening should continue until the smoker has stopped smoking for at least 15 years. Yearly screening should be stopped for people who develop a health problem that would prevent them from having lung cancer treatment.  If you are pregnant, do not drink alcohol. If you are breastfeeding,  be very cautious about drinking alcohol. If you are not pregnant and choose to drink alcohol, do not have more than 1 drink per day. One drink is considered to be 12 ounces (355 mL) of beer, 5 ounces (148 mL) of wine, or 1.5 ounces (44 mL) of liquor.  Avoid use of street drugs. Do not share needles with anyone. Ask for help if you need support or instructions about stopping the use of drugs.  High blood pressure causes heart disease and increases the risk of  stroke. Your blood pressure should be checked at least every 1 to 2 years. Ongoing high blood pressure should be treated with medicines if weight loss and exercise do not work.  If you are 75-52 years old, ask your health care provider if you should take aspirin to prevent strokes.  Diabetes screening involves taking a blood sample to check your fasting blood sugar level. This should be done once every 3 years, after age 15, if you are within normal weight and without risk factors for diabetes. Testing should be considered at a younger age or be carried out more frequently if you are overweight and have at least 1 risk factor for diabetes.  Breast cancer screening is essential preventive care for women. You should practice "breast self-awareness." This means understanding the normal appearance and feel of your breasts and may include breast self-examination. Any changes detected, no matter how small, should be reported to a health care provider. Women in their 58s and 30s should have a clinical breast exam (CBE) by a health care provider as part of a regular health exam every 1 to 3 years. After age 16, women should have a CBE every year. Starting at age 53, women should consider having a mammogram (breast X-ray test) every year. Women who have a family history of breast cancer should talk to their health care provider about genetic screening. Women at a high risk of breast cancer should talk to their health care providers about having an MRI and a mammogram every year.  Breast cancer gene (BRCA)-related cancer risk assessment is recommended for women who have family members with BRCA-related cancers. BRCA-related cancers include breast, ovarian, tubal, and peritoneal cancers. Having family members with these cancers may be associated with an increased risk for harmful changes (mutations) in the breast cancer genes BRCA1 and BRCA2. Results of the assessment will determine the need for genetic counseling and  BRCA1 and BRCA2 testing.  Routine pelvic exams to screen for cancer are no longer recommended for nonpregnant women who are considered low risk for cancer of the pelvic organs (ovaries, uterus, and vagina) and who do not have symptoms. Ask your health care provider if a screening pelvic exam is right for you.  If you have had past treatment for cervical cancer or a condition that could lead to cancer, you need Pap tests and screening for cancer for at least 20 years after your treatment. If Pap tests have been discontinued, your risk factors (such as having a new sexual partner) need to be reassessed to determine if screening should be resumed. Some women have medical problems that increase the chance of getting cervical cancer. In these cases, your health care provider may recommend more frequent screening and Pap tests.  The HPV test is an additional test that may be used for cervical cancer screening. The HPV test looks for the virus that can cause the cell changes on the cervix. The cells collected during the Pap test can be  tested for HPV. The HPV test could be used to screen women aged 30 years and older, and should be used in women of any age who have unclear Pap test results. After the age of 30, women should have HPV testing at the same frequency as a Pap test.  Colorectal cancer can be detected and often prevented. Most routine colorectal cancer screening begins at the age of 50 years and continues through age 75 years. However, your health care provider may recommend screening at an earlier age if you have risk factors for colon cancer. On a yearly basis, your health care provider may provide home test kits to check for hidden blood in the stool. Use of a small camera at the end of a tube, to directly examine the colon (sigmoidoscopy or colonoscopy), can detect the earliest forms of colorectal cancer. Talk to your health care provider about this at age 50, when routine screening begins. Direct  exam of the colon should be repeated every 5-10 years through age 75 years, unless early forms of pre-cancerous polyps or small growths are found.  People who are at an increased risk for hepatitis B should be screened for this virus. You are considered at high risk for hepatitis B if:  You were born in a country where hepatitis B occurs often. Talk with your health care provider about which countries are considered high risk.  Your parents were born in a high-risk country and you have not received a shot to protect against hepatitis B (hepatitis B vaccine).  You have HIV or AIDS.  You use needles to inject street drugs.  You live with, or have sex with, someone who has hepatitis B.  You get hemodialysis treatment.  You take certain medicines for conditions like cancer, organ transplantation, and autoimmune conditions.  Hepatitis C blood testing is recommended for all people born from 1945 through 1965 and any individual with known risks for hepatitis C.  Practice safe sex. Use condoms and avoid high-risk sexual practices to reduce the spread of sexually transmitted infections (STIs). STIs include gonorrhea, chlamydia, syphilis, trichomonas, herpes, HPV, and human immunodeficiency virus (HIV). Herpes, HIV, and HPV are viral illnesses that have no cure. They can result in disability, cancer, and death.  You should be screened for sexually transmitted illnesses (STIs) including gonorrhea and chlamydia if:  You are sexually active and are younger than 24 years.  You are older than 24 years and your health care provider tells you that you are at risk for this type of infection.  Your sexual activity has changed since you were last screened and you are at an increased risk for chlamydia or gonorrhea. Ask your health care provider if you are at risk.  If you are at risk of being infected with HIV, it is recommended that you take a prescription medicine daily to prevent HIV infection. This is  called preexposure prophylaxis (PrEP). You are considered at risk if:  You are a heterosexual woman, are sexually active, and are at increased risk for HIV infection.  You take drugs by injection.  You are sexually active with a partner who has HIV.  Talk with your health care provider about whether you are at high risk of being infected with HIV. If you choose to begin PrEP, you should first be tested for HIV. You should then be tested every 3 months for as long as you are taking PrEP.  Osteoporosis is a disease in which the bones lose minerals and strength   with aging. This can result in serious bone fractures or breaks. The risk of osteoporosis can be identified using a bone density scan. Women ages 65 years and over and women at risk for fractures or osteoporosis should discuss screening with their health care providers. Ask your health care provider whether you should take a calcium supplement or vitamin D to reduce the rate of osteoporosis.  Menopause can be associated with physical symptoms and risks. Hormone replacement therapy is available to decrease symptoms and risks. You should talk to your health care provider about whether hormone replacement therapy is right for you.  Use sunscreen. Apply sunscreen liberally and repeatedly throughout the day. You should seek shade when your shadow is shorter than you. Protect yourself by wearing long sleeves, pants, a wide-brimmed hat, and sunglasses year round, whenever you are outdoors.  Once a month, do a whole body skin exam, using a mirror to look at the skin on your back. Tell your health care provider of new moles, moles that have irregular borders, moles that are larger than a pencil eraser, or moles that have changed in shape or color.  Stay current with required vaccines (immunizations).  Influenza vaccine. All adults should be immunized every year.  Tetanus, diphtheria, and acellular pertussis (Td, Tdap) vaccine. Pregnant women should  receive 1 dose of Tdap vaccine during each pregnancy. The dose should be obtained regardless of the length of time since the last dose. Immunization is preferred during the 27th-36th week of gestation. An adult who has not previously received Tdap or who does not know her vaccine status should receive 1 dose of Tdap. This initial dose should be followed by tetanus and diphtheria toxoids (Td) booster doses every 10 years. Adults with an unknown or incomplete history of completing a 3-dose immunization series with Td-containing vaccines should begin or complete a primary immunization series including a Tdap dose. Adults should receive a Td booster every 10 years.  Varicella vaccine. An adult without evidence of immunity to varicella should receive 2 doses or a second dose if she has previously received 1 dose. Pregnant females who do not have evidence of immunity should receive the first dose after pregnancy. This first dose should be obtained before leaving the health care facility. The second dose should be obtained 4-8 weeks after the first dose.  Human papillomavirus (HPV) vaccine. Females aged 13-26 years who have not received the vaccine previously should obtain the 3-dose series. The vaccine is not recommended for use in pregnant females. However, pregnancy testing is not needed before receiving a dose. If a female is found to be pregnant after receiving a dose, no treatment is needed. In that case, the remaining doses should be delayed until after the pregnancy. Immunization is recommended for any person with an immunocompromised condition through the age of 26 years if she did not get any or all doses earlier. During the 3-dose series, the second dose should be obtained 4-8 weeks after the first dose. The third dose should be obtained 24 weeks after the first dose and 16 weeks after the second dose.  Zoster vaccine. One dose is recommended for adults aged 60 years or older unless certain conditions are  present.  Measles, mumps, and rubella (MMR) vaccine. Adults born before 1957 generally are considered immune to measles and mumps. Adults born in 1957 or later should have 1 or more doses of MMR vaccine unless there is a contraindication to the vaccine or there is laboratory evidence of immunity to   each of the three diseases. A routine second dose of MMR vaccine should be obtained at least 28 days after the first dose for students attending postsecondary schools, health care workers, or international travelers. People who received inactivated measles vaccine or an unknown type of measles vaccine during 1963-1967 should receive 2 doses of MMR vaccine. People who received inactivated mumps vaccine or an unknown type of mumps vaccine before 1979 and are at high risk for mumps infection should consider immunization with 2 doses of MMR vaccine. For females of childbearing age, rubella immunity should be determined. If there is no evidence of immunity, females who are not pregnant should be vaccinated. If there is no evidence of immunity, females who are pregnant should delay immunization until after pregnancy. Unvaccinated health care workers born before 1957 who lack laboratory evidence of measles, mumps, or rubella immunity or laboratory confirmation of disease should consider measles and mumps immunization with 2 doses of MMR vaccine or rubella immunization with 1 dose of MMR vaccine.  Pneumococcal 13-valent conjugate (PCV13) vaccine. When indicated, a person who is uncertain of her immunization history and has no record of immunization should receive the PCV13 vaccine. An adult aged 19 years or older who has certain medical conditions and has not been previously immunized should receive 1 dose of PCV13 vaccine. This PCV13 should be followed with a dose of pneumococcal polysaccharide (PPSV23) vaccine. The PPSV23 vaccine dose should be obtained at least 8 weeks after the dose of PCV13 vaccine. An adult aged 19  years or older who has certain medical conditions and previously received 1 or more doses of PPSV23 vaccine should receive 1 dose of PCV13. The PCV13 vaccine dose should be obtained 1 or more years after the last PPSV23 vaccine dose.  Pneumococcal polysaccharide (PPSV23) vaccine. When PCV13 is also indicated, PCV13 should be obtained first. All adults aged 65 years and older should be immunized. An adult younger than age 65 years who has certain medical conditions should be immunized. Any person who resides in a nursing home or long-term care facility should be immunized. An adult smoker should be immunized. People with an immunocompromised condition and certain other conditions should receive both PCV13 and PPSV23 vaccines. People with human immunodeficiency virus (HIV) infection should be immunized as soon as possible after diagnosis. Immunization during chemotherapy or radiation therapy should be avoided. Routine use of PPSV23 vaccine is not recommended for American Indians, [place] Natives, or people younger than 65 years unless there are medical conditions that require PPSV23 vaccine. When indicated, people who have unknown immunization and have no record of immunization should receive PPSV23 vaccine. One-time revaccination 5 years after the first dose of PPSV23 is recommended for people aged 19-64 years who have chronic kidney failure, nephrotic syndrome, asplenia, or immunocompromised conditions. People who received 1-2 doses of PPSV23 before age 65 years should receive another dose of PPSV23 vaccine at age 65 years or later if at least 5 years have passed since the previous dose. Doses of PPSV23 are not needed for people immunized with PPSV23 at or after age 65 years.  Meningococcal vaccine. Adults with asplenia or persistent complement component deficiencies should receive 2 doses of quadrivalent meningococcal conjugate (MenACWY-D) vaccine. The doses should be obtained at least 2 months apart.  Microbiologists working with certain meningococcal bacteria, military recruits, people at risk during an outbreak, and people who travel to or live in countries with a high rate of meningitis should be immunized. A first-year college student up through age   21 years who is living in a residence hall should receive a dose if she did not receive a dose on or after her 16th birthday. Adults who have certain high-risk conditions should receive one or more doses of vaccine.  Hepatitis A vaccine. Adults who wish to be protected from this disease, have certain high-risk conditions, work with hepatitis A-infected animals, work in hepatitis A research labs, or travel to or work in countries with a high rate of hepatitis A should be immunized. Adults who were previously unvaccinated and who anticipate close contact with an international adoptee during the first 60 days after arrival in the [place] from a country with a high rate of hepatitis A should be immunized.  Hepatitis B vaccine. Adults who wish to be protected from this disease, have certain high-risk conditions, may be exposed to blood or other infectious body fluids, are household contacts or sex partners of hepatitis B positive people, are clients or workers in certain care facilities, or travel to or work in countries with a high rate of hepatitis B should be immunized.  Haemophilus influenzae type b (Hib) vaccine. A previously unvaccinated person with asplenia or sickle cell disease or having a scheduled splenectomy should receive 1 dose of Hib vaccine. Regardless of previous immunization, a recipient of a hematopoietic stem cell transplant should receive a 3-dose series 6-12 months after her successful transplant. Hib vaccine is not recommended for adults with HIV infection. Preventive Services / Frequency Ages 64 to 68 years  Blood pressure check.** / Every 1 to 2 years.  Lipid and cholesterol check.** / Every 5 years beginning at age  22.  Clinical breast exam.** / Every 3 years for women in their 88s and 53s.  BRCA-related cancer risk assessment.** / For women who have family members with a BRCA-related cancer (breast, ovarian, tubal, or peritoneal cancers).  Pap test.** / Every 2 years from ages 90 through 51. Every 3 years starting at age 21 through age 56 or 3 with a history of 3 consecutive normal Pap tests.  HPV screening.** / Every 3 years from ages 24 through ages 1 to 46 with a history of 3 consecutive normal Pap tests.  Hepatitis C blood test.** / For any individual with known risks for hepatitis C.  Skin self-exam. / Monthly.  Influenza vaccine. / Every year.  Tetanus, diphtheria, and acellular pertussis (Tdap, Td) vaccine.** / Consult your health care provider. Pregnant women should receive 1 dose of Tdap vaccine during each pregnancy. 1 dose of Td every 10 years.  Varicella vaccine.** / Consult your health care provider. Pregnant females who do not have evidence of immunity should receive the first dose after pregnancy.  HPV vaccine. / 3 doses over 6 months, if 72 and younger. The vaccine is not recommended for use in pregnant females. However, pregnancy testing is not needed before receiving a dose.  Measles, mumps, rubella (MMR) vaccine.** / You need at least 1 dose of MMR if you were born in 1957 or later. You may also need a 2nd dose. For females of childbearing age, rubella immunity should be determined. If there is no evidence of immunity, females who are not pregnant should be vaccinated. If there is no evidence of immunity, females who are pregnant should delay immunization until after pregnancy.  Pneumococcal 13-valent conjugate (PCV13) vaccine.** / Consult your health care provider.  Pneumococcal polysaccharide (PPSV23) vaccine.** / 1 to 2 doses if you smoke cigarettes or if you have certain conditions.  Meningococcal vaccine.** /  1 dose if you are age 19 to 21 years and a first-year college  student living in a residence hall, or have one of several medical conditions, you need to get vaccinated against meningococcal disease. You may also need additional booster doses.  Hepatitis A vaccine.** / Consult your health care provider.  Hepatitis B vaccine.** / Consult your health care provider.  Haemophilus influenzae type b (Hib) vaccine.** / Consult your health care provider. Ages 40 to 64 years  Blood pressure check.** / Every 1 to 2 years.  Lipid and cholesterol check.** / Every 5 years beginning at age 20 years.  Lung cancer screening. / Every year if you are aged 55-80 years and have a 30-pack-year history of smoking and currently smoke or have quit within the past 15 years. Yearly screening is stopped once you have quit smoking for at least 15 years or develop a health problem that would prevent you from having lung cancer treatment.  Clinical breast exam.** / Every year after age 40 years.  BRCA-related cancer risk assessment.** / For women who have family members with a BRCA-related cancer (breast, ovarian, tubal, or peritoneal cancers).  Mammogram.** / Every year beginning at age 40 years and continuing for as long as you are in good health. Consult with your health care provider.  Pap test.** / Every 3 years starting at age 30 years through age 65 or 70 years with a history of 3 consecutive normal Pap tests.  HPV screening.** / Every 3 years from ages 30 years through ages 65 to 70 years with a history of 3 consecutive normal Pap tests.  Fecal occult blood test (FOBT) of stool. / Every year beginning at age 50 years and continuing until age 75 years. You may not need to do this test if you get a colonoscopy every 10 years.  Flexible sigmoidoscopy or colonoscopy.** / Every 5 years for a flexible sigmoidoscopy or every 10 years for a colonoscopy beginning at age 50 years and continuing until age 75 years.  Hepatitis C blood test.** / For all people born from 1945 through  1965 and any individual with known risks for hepatitis C.  Skin self-exam. / Monthly.  Influenza vaccine. / Every year.  Tetanus, diphtheria, and acellular pertussis (Tdap/Td) vaccine.** / Consult your health care provider. Pregnant women should receive 1 dose of Tdap vaccine during each pregnancy. 1 dose of Td every 10 years.  Varicella vaccine.** / Consult your health care provider. Pregnant females who do not have evidence of immunity should receive the first dose after pregnancy.  Zoster vaccine.** / 1 dose for adults aged 60 years or older.  Measles, mumps, rubella (MMR) vaccine.** / You need at least 1 dose of MMR if you were born in 1957 or later. You may also need a 2nd dose. For females of childbearing age, rubella immunity should be determined. If there is no evidence of immunity, females who are not pregnant should be vaccinated. If there is no evidence of immunity, females who are pregnant should delay immunization until after pregnancy.  Pneumococcal 13-valent conjugate (PCV13) vaccine.** / Consult your health care provider.  Pneumococcal polysaccharide (PPSV23) vaccine.** / 1 to 2 doses if you smoke cigarettes or if you have certain conditions.  Meningococcal vaccine.** / Consult your health care provider.  Hepatitis A vaccine.** / Consult your health care provider.  Hepatitis B vaccine.** / Consult your health care provider.  Haemophilus influenzae type b (Hib) vaccine.** / Consult your health care provider. Ages 65   years and over  Blood pressure check.** / Every 1 to 2 years.  Lipid and cholesterol check.** / Every 5 years beginning at age 22 years.  Lung cancer screening. / Every year if you are aged 73-80 years and have a 30-pack-year history of smoking and currently smoke or have quit within the past 15 years. Yearly screening is stopped once you have quit smoking for at least 15 years or develop a health problem that would prevent you from having lung cancer  treatment.  Clinical breast exam.** / Every year after age 4 years.  BRCA-related cancer risk assessment.** / For women who have family members with a BRCA-related cancer (breast, ovarian, tubal, or peritoneal cancers).  Mammogram.** / Every year beginning at age 40 years and continuing for as long as you are in good health. Consult with your health care provider.  Pap test.** / Every 3 years starting at age 9 years through age 34 or 91 years with 3 consecutive normal Pap tests. Testing can be stopped between 65 and 70 years with 3 consecutive normal Pap tests and no abnormal Pap or HPV tests in the past 10 years.  HPV screening.** / Every 3 years from ages 57 years through ages 64 or 45 years with a history of 3 consecutive normal Pap tests. Testing can be stopped between 65 and 70 years with 3 consecutive normal Pap tests and no abnormal Pap or HPV tests in the past 10 years.  Fecal occult blood test (FOBT) of stool. / Every year beginning at age 15 years and continuing until age 17 years. You may not need to do this test if you get a colonoscopy every 10 years.  Flexible sigmoidoscopy or colonoscopy.** / Every 5 years for a flexible sigmoidoscopy or every 10 years for a colonoscopy beginning at age 86 years and continuing until age 71 years.  Hepatitis C blood test.** / For all people born from [birth year] through 1965 and any individual with known risks for hepatitis C.  Osteoporosis screening.** / A one-time screening for women ages 83 years and over and women at risk for fractures or osteoporosis.  Skin self-exam. / Monthly.  Influenza vaccine. / Every year.  Tetanus, diphtheria, and acellular pertussis (Tdap/Td) vaccine.** / 1 dose of Td every 10 years.  Varicella vaccine.** / Consult your health care provider.  Zoster vaccine.** / 1 dose for adults aged 61 years or older.  Pneumococcal 13-valent conjugate (PCV13) vaccine.** / Consult your health care provider.  Pneumococcal  polysaccharide (PPSV23) vaccine.** / 1 dose for all adults aged 28 years and older.  Meningococcal vaccine.** / Consult your health care provider.  Hepatitis A vaccine.** / Consult your health care provider.  Hepatitis B vaccine.** / Consult your health care provider.  Haemophilus influenzae type b (Hib) vaccine.** / Consult your health care provider. ** Family history and personal history of risk and conditions may change your health care provider's recommendations. Document Released: 10/21/2001 Document Revised: 01/09/2014 Document Reviewed: 01/20/2011 [facility] Patient Information 2015 [place], [place]. This information is not intended to replace advice given to you by your health care provider. Make sure you discuss any questions you have with your health care provider.

## 2014-11-03 NOTE — Progress Notes (Signed)
Subjective:    Michelle [name] is a 69 y.o. female who presents for Medicare Annual/Subsequent preventive examination.  Preventive Screening-Counseling & Management  Tobacco History  Smoking status  . Never Smoker   Smokeless tobacco  . Never Used     Problems Prior to Visit 1. ? sinus infection-- x 1 week.  + sinus pressure.  Pt took allergy pill with no relief.  + cough--Productive, esp in am.  No fever.  2.  Pt going on a 1 week cruise --leaving Tuesday.  Requesting scop  3.pt needs labs done for thyroid, htn, cholesterol.  Current Problems (verified) Patient Active Problem List   Diagnosis Date Noted  . POSTMENOPAUSAL STATUS 07/26/2009  . SINUSITIS, ACUTE 01/17/2009  . INTESTINAL GAS 09/18/2008  . ONYCHOMYCOSIS, TOENAILS 08/14/2008  . GLAUCOMA 07/24/2008  . HYPERTENSION 07/24/2008  . ACUTE FRONTAL SINUSITIS 03/24/2008  . MYALGIA 02/29/2008  . COUGH 07/26/2007  . ADENOMATOUS COLONIC POLYP 05/20/2007  . DIVERTICULOSIS, COLON 05/20/2007  . HYPOTHYROIDISM 04/07/2007  . HYPERLIPIDEMIA 04/07/2007  . GERD 04/07/2007  . IBS 04/07/2007    Medications Prior to Visit Current Outpatient Prescriptions on File Prior to Visit  Medication Sig Dispense Refill  . ALPHAGAN P 0.1 % SOLN     . amLODipine (NORVASC) 5 MG tablet TAKE 1 TABLET (5 MG TOTAL) BY MOUTH DAILY. 90 tablet 0  . aspirin 81 MG tablet Take 160 mg by mouth daily.    . calcium carbonate (OS-CAL) 600 MG TABS Take 600 mg by mouth 2 (two) times daily with a meal.    . esomeprazole (NEXIUM) 20 MG capsule Take 22.3 mg by mouth daily.    Marland Kitchen levothyroxine (SYNTHROID, LEVOTHROID) 50 MCG tablet Take 1 tablet by mouth daily.    . meloxicam (MOBIC) 15 MG tablet Take 15 mg by mouth as needed.     . Multiple Vitamin (MULTIVITAMIN) tablet Take 1 tablet by mouth daily.    . Probiotic Product (ALIGN) 4 MG CAPS Take 1 capsule by mouth daily.     No current facility-administered medications on file prior to visit.    Current  Medications (verified) Current Outpatient Prescriptions  Medication Sig Dispense Refill  . ALPHAGAN P 0.1 % SOLN     . amLODipine (NORVASC) 5 MG tablet TAKE 1 TABLET (5 MG TOTAL) BY MOUTH DAILY. 90 tablet 0  . aspirin 81 MG tablet Take 160 mg by mouth daily.    . calcium carbonate (OS-CAL) 600 MG TABS Take 600 mg by mouth 2 (two) times daily with a meal.    . esomeprazole (NEXIUM) 20 MG capsule Take 22.3 mg by mouth daily.    Marland Kitchen etodolac (LODINE) 400 MG tablet Take 1 tablet by mouth as needed.    Marland Kitchen levothyroxine (SYNTHROID, LEVOTHROID) 50 MCG tablet Take 1 tablet by mouth daily.    . meloxicam (MOBIC) 15 MG tablet Take 15 mg by mouth as needed.     . Multiple Vitamin (MULTIVITAMIN) tablet Take 1 tablet by mouth daily.    . pravastatin (PRAVACHOL) 40 MG tablet TAKE 1 TABLET (40 MG TOTAL) BY MOUTH DAILY. 90 tablet 1  . Probiotic Product (ALIGN) 4 MG CAPS Take 1 capsule by mouth daily.     No current facility-administered medications for this visit.     Allergies (verified) Review of patient's allergies indicates no known allergies.   PAST HISTORY  Family History Family History  Problem Relation Age of Onset  . Macular degeneration Father   . Prostate cancer Father   .  Hypertension Father   . Cancer Father     prostate  . Glaucoma Mother   . Emphysema Mother   . Hypertension Mother   . Other Brother     Brain tumor  . Cancer Brother 92    brain tumor  . Breast cancer Maternal Grandmother   . Prostate cancer Brother     Social History History  Substance Use Topics  . Smoking status: Never Smoker   . Smokeless tobacco: Never Used  . Alcohol Use: No     Are there smokers in your home (other than you)? No  Risk Factors Current exercise habits: walking, [employer] ---slacked off some Dietary issues discussed: none   Cardiac risk factors: advanced age (older than 36 for men, 11 for women), dyslipidemia, hypertension and sedentary lifestyle.  Depression Screen (Note: if  answer to either of the following is "Yes", a more complete depression screening is indicated)   Over the past two weeks, have you felt down, depressed or hopeless? No  Over the past two weeks, have you felt little interest or pleasure in doing things? No  Have you lost interest or pleasure in daily life? No  Do you often feel hopeless? No  Do you cry easily over simple problems? No  Activities of Daily Living In your present state of health, do you have any difficulty performing the following activities?:  Driving? No Managing money?  No Feeding yourself? No Getting from bed to chair?no Climbing a flight of stairs? No Preparing food and eating?: No Bathing or showering? No Getting dressed: No Getting to the toilet? No Using the toilet:No Moving around from place to place: No In the past year have you fallen or had a near fall?:No   Are you sexually active?  Yes  Do you have more than one partner?  No  Hearing Difficulties: yes--+ hearing aids Do you often ask people to speak up or repeat themselves? No Do you experience ringing or noises in your ears? No Do you have difficulty understanding soft or whispered voices? No   Do you feel that you have a problem with memory? No  Do you often misplace items? No  Do you feel safe at home?  Yes  Cognitive Testing  Alert? Yes  Normal Appearance?Yes  Oriented to person? Yes  Place? Yes   Time? Yes  Recall of three objects?  Yes  Can perform simple calculations? Yes  Displays appropriate judgment?Yes  Can read the correct time from a watch face?Yes   Advanced Directives have been discussed with the patient? Yes  List the Names of Other Physician/Practitioners you currently use: 1.  opth--[facility] 2. Dentist- [facility] 3. Periodontist-- [facility]   Indicate any recent Medical Services you may have received from other than [facility] providers in the past year (date may be approximate).  Immunization History  Administered Date(s) Administered   . Influenza Split 09/23/2011  . Influenza Whole 07/26/2007, 06/30/2008, 07/26/2009, 09/12/2010  . Influenza, High Dose Seasonal PF 06/15/2013  . Influenza,inj,Quad PF,36+ Mos 06/19/2014  . Pneumococcal Polysaccharide-23 09/23/2011  . Td 07/02/2005  . Zoster 11/14/2008    Screening Tests Health Maintenance  Topic Date Due  . INFLUENZA VACCINE  04/09/2015  . TETANUS/TDAP  07/03/2015  . MAMMOGRAM  07/10/2015  . PAP SMEAR  10/15/2015  . COLONOSCOPY  10/08/2017  . DEXA SCAN  Completed  . PNEUMOCOCCAL POLYSACCHARIDE VACCINE AGE 54 AND OVER  Completed  . ZOSTAVAX  Completed    All answers were reviewed with  the patient and necessary referrals were made:  Garnet Koyanagi, DO   11/03/2014   History reviewed:  She  has a past medical history of GERD (gastroesophageal reflux disease); Hyperlipidemia; Thyroid disease; Hypertension; IBS (irritable bowel syndrome); Adenomatous colon polyp; Glaucoma; Allergy; Low back pain; and hearing loss. She  does not have any pertinent problems on file. She  has past surgical history that includes Tubal ligation (1977); Cholecystectomy (1998); Abdominal hysterectomy (1998); and Gum surgery (1996 / 2013). Her family history includes Breast cancer in her maternal grandmother; Cancer in her father; Cancer (age of onset: 69) in her brother; Emphysema in her mother; Glaucoma in her mother; Hypertension in her father and mother; Macular degeneration in her father; Other in her brother; Prostate cancer in her brother and father. She  reports that she has never smoked. She has never used smokeless tobacco. She reports that she does not drink alcohol or use illicit drugs. She has a current medication list which includes the following prescription(s): alphagan p, aspirin, calcium carbonate, esomeprazole, etodolac, levothyroxine, meloxicam, multivitamin, pravastatin, align, scopolamine, and valsartan. Current Outpatient Prescriptions on File Prior to Visit  Medication Sig  Dispense Refill  . ALPHAGAN P 0.1 % SOLN     . aspirin 81 MG tablet Take 160 mg by mouth daily.    . calcium carbonate (OS-CAL) 600 MG TABS Take 600 mg by mouth 2 (two) times daily with a meal.    . esomeprazole (NEXIUM) 20 MG capsule Take 22.3 mg by mouth daily.    Marland Kitchen levothyroxine (SYNTHROID, LEVOTHROID) 50 MCG tablet Take 1 tablet by mouth daily.    . meloxicam (MOBIC) 15 MG tablet Take 15 mg by mouth as needed.     . Multiple Vitamin (MULTIVITAMIN) tablet Take 1 tablet by mouth daily.    . Probiotic Product (ALIGN) 4 MG CAPS Take 1 capsule by mouth daily.     No current facility-administered medications on file prior to visit.   She has No Known Allergies.  Review of Systems  Review of Systems  Constitutional: Negative for activity change, appetite change and fatigue.  HENT: Negative for hearing loss, congestion, tinnitus and ear discharge.   Eyes: Negative for visual disturbance (see optho 3 x a year) Respiratory: Negative for cough, chest tightness and shortness of breath.   Cardiovascular: Negative for chest pain, palpitations and leg swelling.  Gastrointestinal: Negative for abdominal pain, diarrhea, constipation and abdominal distention.  Genitourinary: Negative for urgency, frequency, decreased urine volume and difficulty urinating.  Musculoskeletal: Negative for back pain, arthralgias and gait problem.  Skin: Negative for color change, pallor and rash.  Neurological: Negative for dizziness, light-headedness, numbness and headaches.  Hematological: Negative for adenopathy. Does not bruise/bleed easily.  Psychiatric/Behavioral: Negative for suicidal ideas, confusion, sleep disturbance, self-injury, dysphoric mood, decreased concentration and agitation.  Pt is able to read and write and can do all ADLs No risk for falling No abuse/ violence in home      Objective:     Vision by Snellen chart: opth  Body mass index is 27.31 kg/(m^2). BP 120/67 mmHg  Pulse 67   Temp(Src) 97.8 F (36.6 C) (Oral)  Ht 5' 2.75" (1.594 m)  Wt 153 lb (69.4 kg)  BMI 27.31 kg/m2  SpO2 97%  BP 120/67 mmHg  Pulse 67  Temp(Src) 97.8 F (36.6 C) (Oral)  Ht 5' 2.75" (1.594 m)  Wt 153 lb (69.4 kg)  BMI 27.31 kg/m2  SpO2 97% General appearance: alert, cooperative, appears stated age and no  distress Head: Normocephalic, without obvious abnormality, atraumatic Eyes: negative findings: lids and lashes normal and pupils equal, round, reactive to light and accomodation Ears: normal TM's and external ear canals both ears Nose: Nares normal. Septum midline. Mucosa normal. No drainage or sinus tenderness. Throat: lips, mucosa, and tongue normal; teeth and gums normal Neck: no adenopathy, supple, symmetrical, trachea midline and thyroid not enlarged, symmetric, no tenderness/mass/nodules Back: symmetric, no curvature. ROM normal. No CVA tenderness. Lungs: clear to auscultation bilaterally Breasts: normal appearance, no masses or tenderness Heart: S1, S2 normal Abdomen: soft, non-tender; bowel sounds normal; no masses,  no organomegaly Pelvic: not indicated; status post hysterectomy, negative ROS Extremities: extremities normal, atraumatic, no cyanosis or edema Pulses: 2+ and symmetric Skin: Skin color, texture, turgor normal. No rashes or lesions Lymph nodes: Cervical, supraclavicular, and axillary nodes normal. Neurologic: Alert and oriented X 3, normal strength and tone. Normal symmetric reflexes. Normal coordination and gait Psych-- no depression, no anxiety      Assessment:     cpe      Plan:     During the course of the visit the patient was educated and counseled about appropriate screening and preventive services including:    Pneumococcal vaccine   Influenza vaccine  Screening mammography  Bone densitometry screening  Colorectal cancer screening  Diabetes screening  Glaucoma screening  Advanced directives: has an advanced directive - a copy HAS  NOT been provided.  Diet review for nutrition referral? Yes ____  Not Indicated __x__   Patient Instructions (the written plan) was given to the patient.  Medicare Attestation I have personally reviewed: The patient's medical and social history Their use of alcohol, tobacco or illicit drugs Their current medications and supplements The patient's functional ability including ADLs,fall risks, home safety risks, cognitive, and hearing and visual impairment Diet and physical activities Evidence for depression or mood disorders  The patient's weight, height, BMI, and visual acuity have been recorded in the chart.  I have made referrals, counseling, and provided education to the patient based on review of the above and I have provided the patient with a written personalized care plan for preventive services.     1. Estrogen deficiency   - DG Bone Density; Future  2. Essential hypertension stable - valsartan (DIOVAN) 160 MG tablet; Take 1 tablet (160 mg total) by mouth daily.  Dispense: 30 tablet; Refill: 2 - Basic metabolic panel - CBC with Differential/Platelet - POCT urinalysis dipstick - Microalbumin / creatinine urine ratio  3. Hypothyroidism, unspecified hypothyroidism type Check lab con't synthroid - TSH  4. Hyperlipidemia con't pravastatin - Hepatic function panel - Lipid panel  5. Motion sickness, initial encounter   - scopolamine (TRANSDERM-SCOP) 1 MG/3DAYS; Place 1 patch (1.5 mg total) onto the skin every 3 (three) days.  Dispense: 10 patch; Refill: 12  6. Preventative health care    7. Acute frontal sinusitis, recurrence not specified   - fluticasone (FLONASE) 50 MCG/ACT nasal spray; Place 2 sprays into both nostrils daily.  Dispense: 16 g; Refill: 6 - cefUROXime (CEFTIN) 500 MG tablet; Take 1 tablet (500 mg total) by mouth 2 (two) times daily.  Dispense: 20 tablet; Refill: 0  8. Routine history and physical examination of adult    9. Need for  pneumococcal vaccination   - Pneumococcal conjugate vaccine 13-valent  10. Leukocytes in urine   - Urine Culture  Garnet Koyanagi, DO   11/03/2014

## 2014-11-03 NOTE — Progress Notes (Signed)
Pre visit review using our clinic review tool, if applicable. No additional management support is needed unless otherwise documented below in the visit note. 

## 2014-11-05 LAB — URINE CULTURE

## 2014-11-08 ENCOUNTER — Ambulatory Visit (INDEPENDENT_AMBULATORY_CARE_PROVIDER_SITE_OTHER)
Admission: RE | Admit: 2014-11-08 | Discharge: 2014-11-08 | Disposition: A | Discharge status: Home / Self Care | Payer: Medicare HMO | Source: Ambulatory Visit | Attending: Family Medicine | Admitting: Family Medicine

## 2014-11-08 DIAGNOSIS — E2839 Other primary ovarian failure: Secondary | ICD-10-CM

## 2014-11-16 ENCOUNTER — Ambulatory Visit: Payer: Medicare HMO | Admitting: Family Medicine

## 2014-11-21 ENCOUNTER — Encounter: Payer: Self-pay | Admitting: Family Medicine

## 2014-11-21 ENCOUNTER — Ambulatory Visit (INDEPENDENT_AMBULATORY_CARE_PROVIDER_SITE_OTHER): Payer: Medicare HMO | Admitting: Family Medicine

## 2014-11-21 VITALS — BP 122/72 | HR 87 | Temp 97.9°F | Wt 154.8 lb

## 2014-11-21 DIAGNOSIS — J0111 Acute recurrent frontal sinusitis: Secondary | ICD-10-CM

## 2014-11-21 DIAGNOSIS — I10 Essential (primary) hypertension: Secondary | ICD-10-CM

## 2014-11-21 DIAGNOSIS — J0110 Acute frontal sinusitis, unspecified: Secondary | ICD-10-CM

## 2014-11-21 DIAGNOSIS — K219 Gastro-esophageal reflux disease without esophagitis: Secondary | ICD-10-CM

## 2014-11-21 MED ORDER — LEVOTHYROXINE SODIUM 50 MCG PO TABS: 50.0000 ug | ORAL_TABLET | Freq: Every day | ORAL | Status: DC

## 2014-11-21 MED ORDER — RANITIDINE HCL 150 MG PO TABS: 150.0000 mg | ORAL_TABLET | Freq: Two times a day (BID) | ORAL | Status: DC

## 2014-11-21 MED ORDER — AMOXICILLIN-POT CLAVULANATE 875-125 MG PO TABS: 1.0000 | ORAL_TABLET | Freq: Two times a day (BID) | ORAL | Status: DC

## 2014-11-21 MED ORDER — VALSARTAN 160 MG PO TABS: 160.0000 mg | ORAL_TABLET | Freq: Every day | ORAL | Status: DC

## 2014-11-21 NOTE — Patient Instructions (Signed)

## 2014-11-21 NOTE — Progress Notes (Signed)
Pre visit review using our clinic review tool, if applicable. No additional management support is needed unless otherwise documented below in the visit note. 

## 2014-11-21 NOTE — Progress Notes (Signed)
Patient ID: Michelle [name], female    DOB: Nov 17, 1945  Age: 69 y.o. MRN: [id number]    Subjective:  Subjective HPI Michelle [name] presents for f/u bp and c/o con't sinus congestion and pnd since before last ov.    Review of Systems  Constitutional: Positive for chills and fatigue. Negative for fever, activity change, appetite change and unexpected weight change.  HENT: Positive for congestion, postnasal drip, rhinorrhea and sinus pressure. Negative for ear discharge and ear pain.   Respiratory: Positive for cough. Negative for chest tightness, shortness of breath and wheezing.   Cardiovascular: Negative for chest pain, palpitations and leg swelling.  Allergic/Immunologic: Negative for environmental allergies.  Psychiatric/Behavioral: Negative for behavioral problems and dysphoric mood. The patient is not nervous/anxious.     History Past Medical History  Diagnosis Date  . GERD (gastroesophageal reflux disease)   . Hyperlipidemia   . Thyroid disease   . Hypertension   . IBS (irritable bowel syndrome)   . Adenomatous colon polyp   . Glaucoma   . Allergy   . Low back pain   . Hx of hearing loss     bilateral hearing aids    She has past surgical history that includes Tubal ligation (1977); Cholecystectomy (1998); Abdominal hysterectomy (1998); and Gum surgery (1996 / 2013).   Her family history includes Breast cancer in her maternal grandmother; Cancer in her father; Cancer (age of onset: 59) in her brother; Emphysema in her mother; Glaucoma in her mother; Hypertension in her father and mother; Macular degeneration in her father; Other in her brother; Prostate cancer in her brother and father.She reports that she has never smoked. She has never used smokeless tobacco. She reports that she does not drink alcohol or use illicit drugs.  Current Outpatient Prescriptions on File Prior to Visit  Medication Sig Dispense Refill  . ALPHAGAN P 0.1 % SOLN     . aspirin 81 MG tablet Take  160 mg by mouth daily.    . calcium carbonate (OS-CAL) 600 MG TABS Take 600 mg by mouth 2 (two) times daily with a meal.    . etodolac (LODINE) 400 MG tablet Take 1 tablet by mouth as needed.    . fluticasone (FLONASE) 50 MCG/ACT nasal spray Place 2 sprays into both nostrils daily. 16 g 6  . meloxicam (MOBIC) 15 MG tablet Take 15 mg by mouth as needed.     . Multiple Vitamin (MULTIVITAMIN) tablet Take 1 tablet by mouth daily.    . pravastatin (PRAVACHOL) 40 MG tablet TAKE 1 TABLET (40 MG TOTAL) BY MOUTH DAILY. 90 tablet 1  . Probiotic Product (ALIGN) 4 MG CAPS Take 1 capsule by mouth daily.    Marland Kitchen scopolamine (TRANSDERM-SCOP) 1 MG/3DAYS Place 1 patch (1.5 mg total) onto the skin every 3 (three) days. 10 patch 12   No current facility-administered medications on file prior to visit.     Objective:  Objective Physical Exam  Constitutional: She appears well-developed and well-nourished. No distress.  HENT:  Head: Normocephalic and atraumatic.  Right Ear: Tympanic membrane and ear canal normal.  Left Ear: Tympanic membrane and ear canal normal.  Nose: Mucosal edema and rhinorrhea present. Right sinus exhibits maxillary sinus tenderness and frontal sinus tenderness. Left sinus exhibits maxillary sinus tenderness and frontal sinus tenderness.  Mouth/Throat: Uvula is midline and mucous membranes are normal. Posterior oropharyngeal erythema present. No oropharyngeal exudate.  Eyes: Conjunctivae and EOM are normal. Pupils are equal, round, and reactive to light.  Neck: Normal range of motion. Neck supple.  Cardiovascular: Normal rate, regular rhythm and normal heart sounds.   Pulmonary/Chest: Effort normal and breath sounds normal. No respiratory distress. She has no wheezes.  Lymphadenopathy:    She has no cervical adenopathy.   BP 122/72 mmHg  Pulse 87  Temp(Src) 97.9 F (36.6 C) (Oral)  Wt 154 lb 12.8 oz (70.217 kg)  SpO2 95% Wt Readings from Last 3 Encounters:  11/21/14 154 lb 12.8 oz  (70.217 kg)  11/03/14 153 lb (69.4 kg)  04/26/14 154 lb (69.854 kg)     Lab Results  Component Value Date   WBC 6.1 11/03/2014   HGB 12.4 11/03/2014   HCT 36.5 11/03/2014   PLT 256.0 11/03/2014   GLUCOSE 94 11/03/2014   CHOL 136 11/03/2014   TRIG 52.0 11/03/2014   HDL 67.30 11/03/2014   LDLCALC 58 11/03/2014   ALT 14 11/03/2014   AST 18 11/03/2014   NA 140 11/03/2014   K 4.6 11/03/2014   CL 105 11/03/2014   CREATININE 0.78 11/03/2014   [name] 14 11/03/2014   CO2 32 11/03/2014   TSH 3.16 11/03/2014   MICROALBUR 1.0 11/03/2014    Dg Bone Density  11/12/2014   Date of study: 11/08/14 Exam: DUAL X-RAY ABSORPTIOMETRY (DXA) FOR BONE MINERAL DENSITY (BMD) Instrument: [employer] Provider: PCP Indication: follow up for low BMD Comparison: none (please note that it is not possible to compare data from  different instruments) Clinical data: Pt is a postmenopausal 69 y.o. female oncalcium and vitamin  D.  Results:  Lumbar spine (L1-L4) Femoral neck (FN) 33% distal radius  T-score 2.8 RFN: -1.2 LFN: -1.1 n/a  Change in BMD from previous DXA test (%) -1.9% -5.8% n/a  (*) statistically significant  Assessment: the BMD is low according to the [facility] classification for  osteoporosis (see below). Fracture risk: moderate FRAX score: 10 year major osteoporotic risk: 14.8%. 10 year hip fracture  risk: -5.8%. These are under the thresholds for treatment of 20% and 3%,  respectively. Comments: the technical quality of the study is good. Evaluation for secondary causes should be considered if clinically  indicated.  Recommend optimizing calcium (1200 mg/day) and vitamin D (800 IU/day)  intake.  Followup: Repeat BMD is appropriate after 2 years or after 1-2 years if  starting treatment.  WHO criteria for diagnosis of osteoporosis in postmenopausal women and in  men 56 y/o or older:  - normal: T-score -1.0 to + 1.0 - osteopenia/low bone density: T-score between -2.5 and -1.0 - osteoporosis: T-score  below -2.5 - severe osteoporosis: T-score below -2.5 with history of fragility  fracture Note: although not part of the WHO classification, the presence of a  fragility fracture, regardless of the T-score, should be considered  diagnostic of osteoporosis, provided other causes for the fracture have  been excluded.  Treatment: The National Osteoporosis Foundation recommends that treatment  be considered in postmenopausal women and men age 20 or older with: 1. Hip or vertebral (clinical or morphometric) fracture 2. T-score of - 2.5 or lower at the spine or hip 3. 10-year fracture probability by FRAX of at least 20% for a major  osteoporotic fracture and 3% for a hip fracture  Loura Pardon MD        Assessment & Plan:  Plan I have discontinued Ms. Lagares's esomeprazole. I have also changed her levothyroxine. Additionally, I am having her start on amoxicillin-clavulanate and ranitidine. Lastly, I am having her maintain her aspirin,  ALIGN, calcium carbonate, meloxicam, multivitamin, ALPHAGAN P, pravastatin, etodolac, scopolamine, fluticasone, and valsartan.  Meds ordered this encounter  Medications  . amoxicillin-clavulanate (AUGMENTIN) 875-125 MG per tablet    Sig: Take 1 tablet by mouth 2 (two) times daily.    Dispense:  20 tablet    Refill:  0  . levothyroxine (SYNTHROID, LEVOTHROID) 50 MCG tablet    Sig: Take 1 tablet (50 mcg total) by mouth daily.    Dispense:  90 tablet    Refill:  1  . valsartan (DIOVAN) 160 MG tablet    Sig: Take 1 tablet (160 mg total) by mouth daily.    Dispense:  90 tablet    Refill:  1  . ranitidine (ZANTAC) 150 MG tablet    Sig: Take 1 tablet (150 mg total) by mouth 2 (two) times daily.    Problem List Items Addressed This Visit      Unprioritized   GERD   Relevant Medications   ranitidine (ZANTAC) tablet   ACUTE FRONTAL SINUSITIS   Relevant Medications   AMOXICILLIN-POT CLAVULANATE 875-125 MG PO TABS    Other Visit Diagnoses    Essential hypertension    -   Primary    Relevant Medications    valsartan (DIOVAN) tablet    Recurrent frontal sinusitis, unspecified chronicity        Relevant Medications    AMOXICILLIN-POT CLAVULANATE 875-125 MG PO TABS       Follow-up: Return in about 6 months (around 05/24/2015), or if symptoms worsen or fail to improve.  Garnet Koyanagi, DO

## 2014-12-18 ENCOUNTER — Encounter: Payer: Medicare HMO | Admitting: Family Medicine

## 2014-12-30 ENCOUNTER — Other Ambulatory Visit: Payer: Self-pay | Admitting: Family Medicine

## 2015-01-29 ENCOUNTER — Other Ambulatory Visit: Payer: Self-pay | Admitting: Family Medicine

## 2015-02-15 ENCOUNTER — Encounter: Payer: Self-pay | Admitting: Internal Medicine

## 2015-04-09 HISTORY — PX: CATARACT EXTRACTION, BILATERAL: SHX1313

## 2015-07-02 ENCOUNTER — Other Ambulatory Visit: Payer: Self-pay | Admitting: Family Medicine

## 2015-07-21 ENCOUNTER — Other Ambulatory Visit: Payer: Self-pay | Admitting: Family Medicine

## 2015-08-19 ENCOUNTER — Other Ambulatory Visit: Payer: Self-pay | Admitting: Family Medicine

## 2015-08-26 ENCOUNTER — Other Ambulatory Visit: Payer: Self-pay | Admitting: Family Medicine

## 2015-09-28 ENCOUNTER — Ambulatory Visit: Payer: Medicare HMO | Admitting: Family Medicine

## 2015-10-01 ENCOUNTER — Telehealth: Payer: Self-pay | Admitting: Family Medicine

## 2015-10-01 NOTE — Telephone Encounter (Signed)
Pt was no show 09/28/15 11:30am, appt rescheduled 09/28/15 8:58am to come in 10/05/15, no notes in system as to why pt did not show, charge or no charge?

## 2015-10-01 NOTE — Telephone Encounter (Signed)
If this was not pt's first no show, she will need to be charged

## 2015-10-02 DIAGNOSIS — H401130 Primary open-angle glaucoma, bilateral, stage unspecified: Secondary | ICD-10-CM | POA: Diagnosis not present

## 2015-10-02 DIAGNOSIS — H2513 Age-related nuclear cataract, bilateral: Secondary | ICD-10-CM | POA: Diagnosis not present

## 2015-10-02 NOTE — Telephone Encounter (Signed)
1st no show, no charge

## 2015-10-05 ENCOUNTER — Ambulatory Visit (INDEPENDENT_AMBULATORY_CARE_PROVIDER_SITE_OTHER): Payer: Medicare HMO | Admitting: Family Medicine

## 2015-10-05 VITALS — BP 122/60 | HR 72 | Temp 98.1°F | Wt 143.8 lb

## 2015-10-05 DIAGNOSIS — K219 Gastro-esophageal reflux disease without esophagitis: Secondary | ICD-10-CM

## 2015-10-05 DIAGNOSIS — R05 Cough: Secondary | ICD-10-CM | POA: Diagnosis not present

## 2015-10-05 DIAGNOSIS — K589 Irritable bowel syndrome without diarrhea: Secondary | ICD-10-CM

## 2015-10-05 DIAGNOSIS — Z23 Encounter for immunization: Secondary | ICD-10-CM | POA: Diagnosis not present

## 2015-10-05 DIAGNOSIS — R059 Cough, unspecified: Secondary | ICD-10-CM

## 2015-10-05 DIAGNOSIS — J302 Other seasonal allergic rhinitis: Secondary | ICD-10-CM

## 2015-10-05 MED ORDER — FLUTICASONE PROPIONATE 50 MCG/ACT NA SUSP: 2.0000 | Freq: Every day | NASAL | Status: DC

## 2015-10-05 MED ORDER — LINACLOTIDE 290 MCG PO CAPS: 290.0000 ug | ORAL_CAPSULE | Freq: Every day | ORAL | Status: DC

## 2015-10-05 MED ORDER — BENZONATATE 200 MG PO CAPS: 200.0000 mg | ORAL_CAPSULE | Freq: Two times a day (BID) | ORAL | Status: DC | PRN

## 2015-10-05 MED ORDER — LEVOCETIRIZINE DIHYDROCHLORIDE 5 MG PO TABS: 5.0000 mg | ORAL_TABLET | Freq: Every evening | ORAL | Status: DC

## 2015-10-05 MED ORDER — DEXLANSOPRAZOLE 60 MG PO CPDR: 60.0000 mg | DELAYED_RELEASE_CAPSULE | Freq: Every day | ORAL | Status: DC

## 2015-10-05 MED ORDER — HYDROCOD POLST-CPM POLST ER 10-8 MG PO CP12: ORAL_CAPSULE | ORAL | Status: DC

## 2015-10-05 NOTE — Progress Notes (Signed)
Patient ID: Michelle [name], female    DOB: 24-May-1946  Age: 70 y.o. MRN: [id number]    Subjective:  Subjective HPI DELITHA WOEHR presents for chronic dry cough.  Pt had tried changing the htn med but it did nothing. Dr Gwenette Greet had put her on a regime of tussi caps and tessalon perles that worked well.    She is also c/o IBS symptoms-- alt between diarrhea and constipation.  She has never had a work up.   Review of Systems  Constitutional: Negative for diaphoresis, appetite change, fatigue and unexpected weight change.  Eyes: Negative for pain, redness and visual disturbance.  Respiratory: Positive for cough. Negative for chest tightness, shortness of breath and wheezing.   Cardiovascular: Negative for chest pain, palpitations and leg swelling.  Endocrine: Negative for cold intolerance, heat intolerance, polydipsia, polyphagia and polyuria.  Genitourinary: Negative for dysuria, frequency and difficulty urinating.  Neurological: Negative for dizziness, light-headedness, numbness and headaches.    History Past Medical History  Diagnosis Date  . GERD (gastroesophageal reflux disease)   . Hyperlipidemia   . Thyroid disease   . Hypertension   . IBS (irritable bowel syndrome)   . Adenomatous colon polyp   . Glaucoma   . Allergy   . Low back pain   . Hx of hearing loss     bilateral hearing aids    She has past surgical history that includes Tubal ligation (1977); Cholecystectomy (1998); Abdominal hysterectomy (1998); and Gum surgery (1996 / 2013).   Her family history includes Breast cancer in her maternal grandmother; Cancer in her father; Cancer (age of onset: 52) in her brother; Emphysema in her mother; Glaucoma in her mother; Hypertension in her father and mother; Macular degeneration in her father; Other in her brother; Prostate cancer in her brother and father.She reports that she has never smoked. She has never used smokeless tobacco. She reports that she does not drink alcohol  or use illicit drugs.  Current Outpatient Prescriptions on File Prior to Visit  Medication Sig Dispense Refill  . ALPHAGAN P 0.1 % SOLN     . aspirin 81 MG tablet Take 160 mg by mouth daily.    . calcium carbonate (OS-CAL) 600 MG TABS Take 600 mg by mouth 2 (two) times daily with a meal.    . etodolac (LODINE) 400 MG tablet Take 1 tablet by mouth as needed.    Marland Kitchen levothyroxine (SYNTHROID, LEVOTHROID) 50 MCG tablet TAKE 1 TABLET (50 MCG TOTAL) BY MOUTH DAILY. 90 tablet 1  . Multiple Vitamin (MULTIVITAMIN) tablet Take 1 tablet by mouth daily.    . pravastatin (PRAVACHOL) 40 MG tablet Take 1 tablet (40 mg total) by mouth daily. Repeat labs are due now 90 tablet 0  . Probiotic Product (ALIGN) 4 MG CAPS Take 1 capsule by mouth daily.    . valsartan (DIOVAN) 160 MG tablet Take 1 tablet (160 mg total) by mouth daily. Office visit due now 90 tablet 0   No current facility-administered medications on file prior to visit.     Objective:  Objective Physical Exam  Constitutional: She is oriented to person, place, and time. She appears well-developed and well-nourished.  HENT:  Head: Normocephalic and atraumatic.  Eyes: Conjunctivae and EOM are normal.  Neck: Normal range of motion. Neck supple. No JVD present. Carotid bruit is not present. No thyromegaly present.  Cardiovascular: Normal rate, regular rhythm and normal heart sounds.   No murmur heard. Pulmonary/Chest: Effort normal and breath sounds normal.  No respiratory distress. She has no wheezes. She has no rales. She exhibits no tenderness.  Musculoskeletal: She exhibits no edema.  Neurological: She is alert and oriented to person, place, and time.  Psychiatric: She has a normal mood and affect. Her behavior is normal. Judgment and thought content normal.  Nursing note and vitals reviewed.  BP 122/60 mmHg  Pulse 72  Temp(Src) 98.1 F (36.7 C) (Oral)  Wt 143 lb 12.8 oz (65.227 kg)  SpO2 98% Wt Readings from Last 3 Encounters:  10/05/15  143 lb 12.8 oz (65.227 kg)  11/21/14 154 lb 12.8 oz (70.217 kg)  11/03/14 153 lb (69.4 kg)     Lab Results  Component Value Date   WBC 6.1 11/03/2014   HGB 12.4 11/03/2014   HCT 36.5 11/03/2014   PLT 256.0 11/03/2014   GLUCOSE 94 11/03/2014   CHOL 136 11/03/2014   TRIG 52.0 11/03/2014   HDL 67.30 11/03/2014   LDLCALC 58 11/03/2014   ALT 14 11/03/2014   AST 18 11/03/2014   NA 140 11/03/2014   K 4.6 11/03/2014   CL 105 11/03/2014   CREATININE 0.78 11/03/2014   [name] 14 11/03/2014   CO2 32 11/03/2014   TSH 3.16 11/03/2014   MICROALBUR 1.0 11/03/2014    Dg Bone Density  11/12/2014  Date of study: 11/08/14 Exam: DUAL X-RAY ABSORPTIOMETRY (DXA) FOR BONE MINERAL DENSITY (BMD) Instrument: [employer] Provider: PCP Indication: follow up for low BMD Comparison: none (please note that it is not possible to compare data from different instruments) Clinical data: Pt is a postmenopausal 70 y.o. female oncalcium and vitamin D. Results:  Lumbar spine (L1-L4) Femoral neck (FN) 33% distal radius T-score 2.8 RFN: -1.2 LFN: -1.1 n/a Change in BMD from previous DXA test (%) -1.9% -5.8% n/a (*) statistically significant Assessment: the BMD is low according to the [facility] classification for osteoporosis (see below). Fracture risk: moderate FRAX score: 10 year major osteoporotic risk: 14.8%. 10 year hip fracture risk: -5.8%. These are under the thresholds for treatment of 20% and 3%, respectively. Comments: the technical quality of the study is good. Evaluation for secondary causes should be considered if clinically indicated. Recommend optimizing calcium (1200 mg/day) and vitamin D (800 IU/day) intake. Followup: Repeat BMD is appropriate after 2 years or after 1-2 years if starting treatment. WHO criteria for diagnosis of osteoporosis in postmenopausal women and in men 62 y/o or older: - normal: T-score -1.0 to + 1.0 - osteopenia/low bone density: T-score between -2.5 and -1.0 - osteoporosis:  T-score below -2.5 - severe osteoporosis: T-score below -2.5 with history of fragility fracture Note: although not part of the WHO classification, the presence of a fragility fracture, regardless of the T-score, should be considered diagnostic of osteoporosis, provided other causes for the fracture have been excluded. Treatment: The National Osteoporosis Foundation recommends that treatment be considered in postmenopausal women and men age 85 or older with: 1. Hip or vertebral (clinical or morphometric) fracture 2. T-score of - 2.5 or lower at the spine or hip 3. 10-year fracture probability by FRAX of at least 20% for a major osteoporotic fracture and 3% for a hip fracture Loura Pardon MD     Assessment & Plan:  Plan I have discontinued Ms. Leikam's meloxicam, scopolamine, fluticasone, amoxicillin-clavulanate, and ranitidine. I am also having her start on Linaclotide, dexlansoprazole, levocetirizine, and fluticasone. Additionally, I am having her maintain her aspirin, ALIGN, calcium carbonate, multivitamin, ALPHAGAN P, etodolac, valsartan, levothyroxine, pravastatin, Hydrocod Polst-Chlorphen Polst, and  benzonatate.  Meds ordered this encounter  Medications  . Linaclotide (LINZESS) 290 MCG CAPS capsule    Sig: Take 1 capsule (290 mcg total) by mouth daily.    Dispense:  30 capsule    Refill:  3  . Hydrocod Polst-Chlorphen Polst (TUSSICAPS) 10-8 MG CP12    Sig: 1 po q12h prn cough    Dispense:  20 each    Refill:  0  . benzonatate (TESSALON) 200 MG capsule    Sig: Take 1 capsule (200 mg total) by mouth 2 (two) times daily as needed for cough.    Dispense:  20 capsule    Refill:  0  . dexlansoprazole (DEXILANT) 60 MG capsule    Sig: Take 1 capsule (60 mg total) by mouth daily.    Dispense:  30 capsule    Refill:  5  . levocetirizine (XYZAL) 5 MG tablet    Sig: Take 1 tablet (5 mg total) by mouth every evening.    Dispense:  30 tablet    Refill:  5  . fluticasone (FLONASE) 50 MCG/ACT nasal  spray    Sig: Place 2 sprays into both nostrils daily.    Dispense:  16 g    Refill:  6    Problem List Items Addressed This Visit      Unprioritized   GERD   Relevant Medications   Linaclotide (LINZESS) 290 MCG CAPS capsule   dexlansoprazole (DEXILANT) 60 MG capsule   COUGH   Relevant Medications   Hydrocod Polst-Chlorphen Polst (TUSSICAPS) 10-8 MG CP12   benzonatate (TESSALON) 200 MG capsule   levocetirizine (XYZAL) 5 MG tablet   fluticasone (FLONASE) 50 MCG/ACT nasal spray    Other Visit Diagnoses    Need for immunization against influenza    -  Primary    Relevant Orders    Flu vaccine HIGH DOSE PF (Fluzone High dose) (Completed)    IBS (irritable bowel syndrome)        Relevant Medications    Linaclotide (LINZESS) 290 MCG CAPS capsule    dexlansoprazole (DEXILANT) 60 MG capsule    Other Relevant Orders    Ambulatory referral to Gastroenterology    Seasonal allergies        Relevant Medications    levocetirizine (XYZAL) 5 MG tablet    fluticasone (FLONASE) 50 MCG/ACT nasal spray       Follow-up: Return if symptoms worsen or fail to improve.  Garnet Koyanagi, DO

## 2015-10-05 NOTE — Patient Instructions (Signed)
Diet for Irritable Bowel Syndrome When you have irritable bowel syndrome (IBS), the foods you eat and your eating habits are very important. IBS may cause various symptoms, such as abdominal pain, constipation, or diarrhea. Choosing the right foods can help ease discomfort caused by these symptoms. Work with your health care provider and dietitian to find the best eating plan to help control your symptoms. WHAT GENERAL GUIDELINES DO I NEED TO FOLLOW?  Keep a food diary. This will help you identify foods that cause symptoms. Write down:  What you eat and when.  What symptoms you have.  When symptoms occur in relation to your meals.  Avoid foods that cause symptoms. Talk with your dietitian about other ways to get the same nutrients that are in these foods.  Eat more foods that contain fiber. Take a fiber supplement if directed by your dietitian.  Eat your meals slowly, in a relaxed setting.  Aim to eat 5-6 small meals per day. Do not skip meals.  Drink enough fluids to keep your urine clear or pale yellow.  Ask your health care provider if you should take an over-the-counter probiotic during flare-ups to help restore healthy gut bacteria.  If you have cramping or diarrhea, try making your meals low in fat and high in carbohydrates. Examples of carbohydrates are pasta, rice, whole grain breads and cereals, fruits, and vegetables.  If dairy products cause your symptoms to flare up, try eating less of them. You might be able to handle yogurt better than other dairy products because it contains bacteria that help with digestion. WHAT FOODS ARE NOT RECOMMENDED? The following are some foods and drinks that may worsen your symptoms:  Fatty foods, such as French fries.  Milk products, such as cheese or ice cream.  Chocolate.  Alcohol.  Products with caffeine, such as coffee.  Carbonated drinks, such as soda. The items listed above may not be a complete list of foods and beverages to  avoid. Contact your dietitian for more information. WHAT FOODS ARE GOOD SOURCES OF FIBER? Your health care provider or dietitian may recommend that you eat more foods that contain fiber. Fiber can help reduce constipation and other IBS symptoms. Add foods with fiber to your diet a little at a time so that your body can get used to them. Too much fiber at once might cause gas and swelling of your abdomen. The following are some foods that are good sources of fiber:  Apples.  Peaches.  Pears.  Berries.  Figs.  Broccoli (raw).  Cabbage.  Carrots.  Raw peas.  Kidney beans.  Lima beans.  Whole grain bread.  Whole grain cereal. FOR MORE INFORMATION  International Foundation for Functional Gastrointestinal Disorders: www.iffgd.org National Institute of Diabetes and Digestive and Kidney Diseases: www.niddk.nih.gov/health-information/health-topics/digestive-diseases/ibs/Pages/facts.aspx   This information is not intended to replace advice given to you by your health care provider. Make sure you discuss any questions you have with your health care provider.   Document Released: 11/15/2003 Document Revised: 09/15/2014 Document Reviewed: 11/25/2013 Elsevier Interactive Patient Education 2016 [employer] Inc.  

## 2015-10-05 NOTE — Progress Notes (Signed)
Pre visit review using our clinic review tool, if applicable. No additional management support is needed unless otherwise documented below in the visit note. 

## 2015-10-06 ENCOUNTER — Encounter: Payer: Self-pay | Admitting: Family Medicine

## 2015-10-08 ENCOUNTER — Telehealth: Payer: Self-pay | Admitting: Family Medicine

## 2015-10-08 NOTE — Telephone Encounter (Signed)
Caller name: [name] with [facility]  Can be reached: [phone]  Reason for call: Received call from insurance provider stating dexilant is non covered. Other alternatives are pantoprazole and omeprazole. If neither of those will work for the patient a PA needs submitted for dexilant.

## 2015-10-08 NOTE — Telephone Encounter (Signed)
Let pt know we will try protonix first--- 1 a day, #30  5 refills Call us if it does not work

## 2015-10-08 NOTE — Telephone Encounter (Signed)
To MD to review and advise if the alternatives are ok or if PA needs to be completed.       KP

## 2015-10-10 NOTE — Telephone Encounter (Signed)
message left to call the office     KP

## 2015-10-12 MED ORDER — PANTOPRAZOLE SODIUM 40 MG PO TBEC: 40.0000 mg | DELAYED_RELEASE_TABLET | Freq: Every day | ORAL | Status: DC

## 2015-10-12 NOTE — Telephone Encounter (Signed)
Patient has been made aware that the Rx has been faxed and she has agreed to call if it does not work.    KP

## 2015-10-12 NOTE — Addendum Note (Signed)
Addended by: Ewing Schlein on: 10/12/2015 12:07 PM   Modules accepted: Orders

## 2015-10-18 ENCOUNTER — Other Ambulatory Visit: Payer: Self-pay | Admitting: Family Medicine

## 2015-11-23 ENCOUNTER — Other Ambulatory Visit: Payer: Self-pay

## 2015-11-23 ENCOUNTER — Other Ambulatory Visit: Payer: Self-pay | Admitting: Family Medicine

## 2015-11-23 MED ORDER — PRAVASTATIN SODIUM 40 MG PO TABS: ORAL_TABLET | ORAL | Status: DC

## 2015-11-26 ENCOUNTER — Other Ambulatory Visit: Payer: Self-pay | Admitting: Family Medicine

## 2015-11-26 DIAGNOSIS — E785 Hyperlipidemia, unspecified: Secondary | ICD-10-CM

## 2015-11-26 DIAGNOSIS — E079 Disorder of thyroid, unspecified: Secondary | ICD-10-CM

## 2015-11-26 NOTE — Telephone Encounter (Signed)
Please schedule this patient a lab apt. Orders are in, med's faxed    KP

## 2015-11-27 NOTE — Telephone Encounter (Signed)
Left message for patient to call and schedule appt

## 2016-01-11 DIAGNOSIS — Z1231 Encounter for screening mammogram for malignant neoplasm of breast: Secondary | ICD-10-CM | POA: Diagnosis not present

## 2016-01-11 DIAGNOSIS — N952 Postmenopausal atrophic vaginitis: Secondary | ICD-10-CM | POA: Diagnosis not present

## 2016-02-22 ENCOUNTER — Other Ambulatory Visit: Payer: Self-pay | Admitting: Family Medicine

## 2016-03-13 ENCOUNTER — Telehealth: Payer: Self-pay

## 2016-03-13 NOTE — Telephone Encounter (Signed)
Patient is on the list for [employer] 2017 and may be a good candidate for an AWV in 2017. Please let me know if/when appt is scheduled.   

## 2016-03-20 NOTE — Telephone Encounter (Signed)
Patient will call back and schedule at another time.

## 2016-03-21 ENCOUNTER — Ambulatory Visit (INDEPENDENT_AMBULATORY_CARE_PROVIDER_SITE_OTHER): Payer: Medicare HMO | Admitting: Family Medicine

## 2016-03-21 ENCOUNTER — Encounter: Payer: Self-pay | Admitting: Family Medicine

## 2016-03-21 VITALS — BP 112/66 | HR 66 | Temp 98.3°F | Ht 62.75 in | Wt 138.4 lb

## 2016-03-21 DIAGNOSIS — I10 Essential (primary) hypertension: Secondary | ICD-10-CM

## 2016-03-21 DIAGNOSIS — R413 Other amnesia: Secondary | ICD-10-CM | POA: Diagnosis not present

## 2016-03-21 DIAGNOSIS — E785 Hyperlipidemia, unspecified: Secondary | ICD-10-CM

## 2016-03-21 DIAGNOSIS — E039 Hypothyroidism, unspecified: Secondary | ICD-10-CM

## 2016-03-21 LAB — COMPREHENSIVE METABOLIC PANEL
ALBUMIN: 4.1 g/dL (ref 3.6–5.1)
ALK PHOS: 59 U/L (ref 33–130)
ALT: 21 U/L (ref 6–29)
AST: 22 U/L (ref 10–35)
BILIRUBIN TOTAL: 0.3 mg/dL (ref 0.2–1.2)
BUN: 12 mg/dL (ref 7–25)
CALCIUM: 9.3 mg/dL (ref 8.6–10.4)
CO2: 28 mmol/L (ref 20–31)
Chloride: 105 mmol/L (ref 98–110)
Creat: 0.71 mg/dL (ref 0.60–0.93)
Glucose, Bld: 92 mg/dL (ref 65–99)
POTASSIUM: 5.6 mmol/L — AB (ref 3.5–5.3)
Sodium: 143 mmol/L (ref 135–146)
Total Protein: 6.9 g/dL (ref 6.1–8.1)

## 2016-03-21 LAB — CBC WITH DIFFERENTIAL/PLATELET
BASOS ABS: 0 {cells}/uL (ref 0–200)
Basophils Relative: 0 %
EOS PCT: 2 %
Eosinophils Absolute: 128 cells/uL (ref 15–500)
HEMATOCRIT: 36.5 % (ref 35.0–45.0)
HEMOGLOBIN: 11.9 g/dL (ref 11.7–15.5)
LYMPHS ABS: 1920 {cells}/uL (ref 850–3900)
LYMPHS PCT: 30 %
MCH: 27.8 pg (ref 27.0–33.0)
MCHC: 32.6 g/dL (ref 32.0–36.0)
MCV: 85.3 fL (ref 80.0–100.0)
MONO ABS: 640 {cells}/uL (ref 200–950)
MPV: 9.8 fL (ref 7.5–12.5)
Monocytes Relative: 10 %
NEUTROS PCT: 58 %
Neutro Abs: 3712 cells/uL (ref 1500–7800)
Platelets: 275 10*3/uL (ref 140–400)
RBC: 4.28 MIL/uL (ref 3.80–5.10)
RDW: 12.8 % (ref 11.0–15.0)
WBC: 6.4 10*3/uL (ref 3.8–10.8)

## 2016-03-21 LAB — POCT URINALYSIS DIPSTICK
BILIRUBIN UA: NEGATIVE
Blood, UA: NEGATIVE
Glucose, UA: NEGATIVE
KETONES UA: NEGATIVE
LEUKOCYTES UA: NEGATIVE
Nitrite, UA: NEGATIVE
PH UA: 6
Protein, UA: NEGATIVE
SPEC GRAV UA: 1.015
Urobilinogen, UA: 0.2

## 2016-03-21 LAB — LIPID PANEL
Cholesterol: 133 mg/dL (ref 125–200)
HDL: 67 mg/dL (ref >=46)
LDL CALC: 51 mg/dL (ref <130)
Total CHOL/HDL Ratio: 2 Ratio (ref <=5.0)
Triglycerides: 73 mg/dL (ref <150)
VLDL: 15 mg/dL (ref <30)

## 2016-03-21 LAB — TSH: TSH: 2.75 m[IU]/L

## 2016-03-21 LAB — VITAMIN B12: Vitamin B-12: 372 pg/mL (ref 200–1100)

## 2016-03-21 NOTE — Patient Instructions (Signed)

## 2016-03-21 NOTE — Progress Notes (Signed)
Patient ID: Michelle [name], female    DOB: 02-02-1946  Age: 70 y.o. MRN: [id number]    Subjective:  Subjective HPI TYNA HASPER presents to discuss memory issues.  Its gotten worse in the last year.  She is repeating a lot of things.  Her husband is with her ---- her daughter is also concerned.    Review of Systems  Constitutional: Negative for diaphoresis, appetite change, fatigue and unexpected weight change.  Eyes: Negative for pain, redness and visual disturbance.  Respiratory: Negative for cough, chest tightness, shortness of breath and wheezing.   Cardiovascular: Negative for chest pain, palpitations and leg swelling.  Endocrine: Negative for cold intolerance, heat intolerance, polydipsia, polyphagia and polyuria.  Genitourinary: Negative for dysuria, frequency and difficulty urinating.  Neurological: Negative for dizziness, tremors, seizures, syncope, speech difficulty, weakness, light-headedness, numbness and headaches.  Psychiatric/Behavioral: Positive for confusion and decreased concentration. Negative for behavioral problems, sleep disturbance, dysphoric mood and agitation. The patient is not nervous/anxious.     History Past Medical History  Diagnosis Date  . GERD (gastroesophageal reflux disease)   . Hyperlipidemia   . Thyroid disease   . Hypertension   . IBS (irritable bowel syndrome)   . Adenomatous colon polyp   . Glaucoma   . Allergy   . Low back pain   . Hx of hearing loss     bilateral hearing aids    She has past surgical history that includes Tubal ligation (1977); Cholecystectomy (1998); Abdominal hysterectomy (1998); and Gum surgery (1996 / 2013).   Her family history includes Breast cancer in her maternal grandmother; Cancer in her father; Cancer (age of onset: 11) in her brother; Emphysema in her mother; Glaucoma in her mother; Hypertension in her father and mother; Macular degeneration in her father; Other in her brother; Prostate cancer in her  brother and father.She reports that she has never smoked. She has never used smokeless tobacco. She reports that she does not drink alcohol or use illicit drugs.  Current Outpatient Prescriptions on File Prior to Visit  Medication Sig Dispense Refill  . ALPHAGAN P 0.1 % SOLN     . aspirin 81 MG tablet Take 160 mg by mouth daily.    . calcium carbonate (OS-CAL) 600 MG TABS Take 600 mg by mouth 2 (two) times daily with a meal.    . levothyroxine (SYNTHROID, LEVOTHROID) 50 MCG tablet TAKE 1 TABLET (50 MCG TOTAL) BY MOUTH DAILY. 30 tablet 0  . Multiple Vitamin (MULTIVITAMIN) tablet Take 1 tablet by mouth daily.    . pravastatin (PRAVACHOL) 40 MG tablet TAKE 1 TABLET (40 MG TOTAL) BY MOUTH DAILY. REPEAT LABS ARE DUE NOW 30 tablet 0  . Probiotic Product (ALIGN) 4 MG CAPS Take 1 capsule by mouth daily.    . valsartan (DIOVAN) 160 MG tablet Take 1 tablet (160 mg total) by mouth daily. 90 tablet 1   No current facility-administered medications on file prior to visit.     Objective:  Objective Physical Exam  Constitutional: She is oriented to person, place, and time. She appears well-developed and well-nourished.  HENT:  Head: Normocephalic and atraumatic.  Eyes: Conjunctivae and EOM are normal.  Neck: Normal range of motion. Neck supple. No JVD present. Carotid bruit is not present. No thyromegaly present.  Cardiovascular: Normal rate, regular rhythm and normal heart sounds.   No murmur heard. Pulmonary/Chest: Effort normal and breath sounds normal. No respiratory distress. She has no wheezes. She has no rales. She exhibits no  tenderness.  Musculoskeletal: She exhibits no edema.  Neurological: She is alert and oriented to person, place, and time.  Psychiatric: She has a normal mood and affect. Her mood appears not anxious. Cognition and memory are impaired. She does not exhibit a depressed mood. She exhibits abnormal recent memory. She exhibits normal remote memory.  Nursing note and vitals  reviewed.  BP 112/66 mmHg  Pulse 66  Temp(Src) 98.3 F (36.8 C) (Oral)  Ht 5' 2.75" (1.594 m)  Wt 138 lb 6.4 oz (62.778 kg)  BMI 24.71 kg/m2  SpO2 100% Wt Readings from Last 3 Encounters:  03/21/16 138 lb 6.4 oz (62.778 kg)  10/05/15 143 lb 12.8 oz (65.227 kg)  11/21/14 154 lb 12.8 oz (70.217 kg)     Lab Results  Component Value Date   WBC 6.1 11/03/2014   HGB 12.4 11/03/2014   HCT 36.5 11/03/2014   PLT 256.0 11/03/2014   GLUCOSE 94 11/03/2014   CHOL 136 11/03/2014   TRIG 52.0 11/03/2014   HDL 67.30 11/03/2014   LDLCALC 58 11/03/2014   ALT 14 11/03/2014   AST 18 11/03/2014   NA 140 11/03/2014   K 4.6 11/03/2014   CL 105 11/03/2014   CREATININE 0.78 11/03/2014   [name] 14 11/03/2014   CO2 32 11/03/2014   TSH 3.16 11/03/2014   MICROALBUR 1.0 11/03/2014   MMSE-- 25/30 Dg Bone Density  11/12/2014  Date of study: 11/08/14 Exam: DUAL X-RAY ABSORPTIOMETRY (DXA) FOR BONE MINERAL DENSITY (BMD) Instrument: [employer] Provider: PCP Indication: follow up for low BMD Comparison: none (please note that it is not possible to compare data from different instruments) Clinical data: Pt is a postmenopausal 70 y.o. female oncalcium and vitamin D. Results:  Lumbar spine (L1-L4) Femoral neck (FN) 33% distal radius T-score 2.8 RFN: -1.2 LFN: -1.1 n/a Change in BMD from previous DXA test (%) -1.9% -5.8% n/a (*) statistically significant Assessment: the BMD is low according to the [facility] classification for osteoporosis (see below). Fracture risk: moderate FRAX score: 10 year major osteoporotic risk: 14.8%. 10 year hip fracture risk: -5.8%. These are under the thresholds for treatment of 20% and 3%, respectively. Comments: the technical quality of the study is good. Evaluation for secondary causes should be considered if clinically indicated. Recommend optimizing calcium (1200 mg/day) and vitamin D (800 IU/day) intake. Followup: Repeat BMD is appropriate after 2 years or after 1-2 years if  starting treatment. WHO criteria for diagnosis of osteoporosis in postmenopausal women and in men 58 y/o or older: - normal: T-score -1.0 to + 1.0 - osteopenia/low bone density: T-score between -2.5 and -1.0 - osteoporosis: T-score below -2.5 - severe osteoporosis: T-score below -2.5 with history of fragility fracture Note: although not part of the WHO classification, the presence of a fragility fracture, regardless of the T-score, should be considered diagnostic of osteoporosis, provided other causes for the fracture have been excluded. Treatment: The National Osteoporosis Foundation recommends that treatment be considered in postmenopausal women and men age 56 or older with: 1. Hip or vertebral (clinical or morphometric) fracture 2. T-score of - 2.5 or lower at the spine or hip 3. 10-year fracture probability by FRAX of at least 20% for a major osteoporotic fracture and 3% for a hip fracture Loura Pardon MD     Assessment & Plan:  Plan I have discontinued Ms. Gedney's etodolac, linaclotide, Hydrocod Polst-Chlorphen Polst, benzonatate, dexlansoprazole, levocetirizine, fluticasone, and pantoprazole. I am also having her maintain her aspirin, ALIGN, calcium carbonate, multivitamin,  ALPHAGAN P, valsartan, levothyroxine, and pravastatin.  No orders of the defined types were placed in this encounter.    Problem List Items Addressed This Visit      Unprioritized   Memory loss - Primary    Check labs mmse 25/30 Mri brain Consider neuro referral       Relevant Orders   Vitamin B12   Comprehensive metabolic panel   CBC with Differential/Platelet   MR Brain Wo Contrast    Other Visit Diagnoses    Hypothyroidism, unspecified hypothyroidism type        Relevant Orders    TSH    Hyperlipidemia        Relevant Orders    POCT urinalysis dipstick (Completed)    Lipid panel    Essential hypertension        Relevant Orders    POCT urinalysis dipstick (Completed)    Comprehensive metabolic panel     CBC with Differential/Platelet       Follow-up: Return in about 3 months (around 06/21/2016), or if symptoms worsen or fail to improve, for annual exam, fasting.  Ann Held, DO

## 2016-03-21 NOTE — Assessment & Plan Note (Signed)
Check labs mmse 25/30 Mri brain Consider neuro referral

## 2016-03-23 ENCOUNTER — Other Ambulatory Visit: Payer: Self-pay | Admitting: Family Medicine

## 2016-03-24 ENCOUNTER — Telehealth: Payer: Self-pay | Admitting: Behavioral Health

## 2016-03-24 DIAGNOSIS — I10 Essential (primary) hypertension: Secondary | ICD-10-CM

## 2016-03-24 NOTE — Telephone Encounter (Signed)
-----   Message from Ann Held, DO sent at 03/23/2016  6:33 PM EDT ----- Potassium is high----  Repeat in next 2 weeks---- bmp

## 2016-03-24 NOTE — Telephone Encounter (Signed)
Rx sent to the pharmacy by e-script.//AB/CMA 

## 2016-03-28 ENCOUNTER — Other Ambulatory Visit: Payer: Self-pay

## 2016-03-28 DIAGNOSIS — R413 Other amnesia: Secondary | ICD-10-CM

## 2016-03-30 ENCOUNTER — Ambulatory Visit (HOSPITAL_BASED_OUTPATIENT_CLINIC_OR_DEPARTMENT_OTHER): Payer: Medicare HMO

## 2016-04-02 ENCOUNTER — Encounter: Payer: Self-pay | Admitting: Neurology

## 2016-04-02 ENCOUNTER — Ambulatory Visit (INDEPENDENT_AMBULATORY_CARE_PROVIDER_SITE_OTHER): Payer: Medicare HMO | Admitting: Neurology

## 2016-04-02 VITALS — BP 120/82 | HR 72 | Ht 63.0 in | Wt 140.0 lb

## 2016-04-02 DIAGNOSIS — F329 Major depressive disorder, single episode, unspecified: Secondary | ICD-10-CM

## 2016-04-02 DIAGNOSIS — G3184 Mild cognitive impairment, so stated: Secondary | ICD-10-CM | POA: Diagnosis not present

## 2016-04-02 DIAGNOSIS — E038 Other specified hypothyroidism: Secondary | ICD-10-CM

## 2016-04-02 DIAGNOSIS — I10 Essential (primary) hypertension: Secondary | ICD-10-CM

## 2016-04-02 DIAGNOSIS — F32A Depression, unspecified: Secondary | ICD-10-CM | POA: Insufficient documentation

## 2016-04-02 DIAGNOSIS — E785 Hyperlipidemia, unspecified: Secondary | ICD-10-CM

## 2016-04-02 DIAGNOSIS — R69 Illness, unspecified: Secondary | ICD-10-CM | POA: Diagnosis not present

## 2016-04-02 MED ORDER — PAROXETINE HCL 10 MG PO TABS: ORAL_TABLET | ORAL | 6 refills | Status: DC

## 2016-04-02 NOTE — Progress Notes (Signed)
NEUROLOGY CONSULTATION NOTE  Michelle [name] MRN: [id number] DOB: 1946-03-22  Referring provider: Dr. Lyndal Pulley Primary care provider: Dr. Lyndal Pulley  Reason for consult:  Memory loss  Dear Dr Cheri Rous:  Thank you for your kind referral of Michelle [name] for consultation of the above symptoms. Although her history is well known to you, please allow me to reiterate it for the purpose of our medical record. The patient was accompanied to the clinic by her husband and daughter who also provide collateral information. Records and images were personally reviewed where available.  HISTORY OF PRESENT ILLNESS: This is a pleasant 70 year old right-handed woman with a history of hypertension, hyperlipidemia, hypothyroidism, presenting for evaluation of memory loss. Her daughter started noticing memory changes a year ago. The patient states she can tell a difference in her memory, but that her family notices it more and "they may think I'm in denial." Her family has noticed worsening over the past 6 months, she repeats herself several times, sometimes having the same conversation four times within an hour. Her husband reports that she asked him repeatedly last 5th of July weekend what he was wearing to go home. She misplaces things frequently in the house. She left the stove on one time boiling water. She takes longer to get ready to go out. She denies getting lost driving, no missed bills or medications. 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 Her family also expressed concern about personality changes, she used to be laidback but now her patience is not there. She is cussing a lot more easily now. She doesn't want to do things she used to like. She does not like going out, and stayed in the room while at the beach more than with her family. No paranoia. No difficulties with ADLs. There is no family history of dementia, she  denies any history of head injuries, alcohol intake. She has been having dull right frontal headaches for the past 6 months attributed to her sinuses, no associated nausea/vomiting. She denies any dizziness, diplopia, dysarthria/dysphagia, focal numbness/tingling/weakness, bowel/bladder dysfunction. She has occasional neck and left shoulder pain. No falls.   She had an MMSE done with her PCP this month, MMSE 25/30. TSH and B12 unremarkable. She has been unable to do the MRI brain.  Laboratory Data: Lab Results  Component Value Date   TSH 2.75 03/21/2016   Lab Results  Component Value Date   [id number] 03/21/2016     PAST MEDICAL HISTORY: Past Medical History:  Diagnosis Date  . Adenomatous colon polyp   . Allergy   . GERD (gastroesophageal reflux disease)   . Glaucoma   . Hx of hearing loss    bilateral hearing aids  . Hyperlipidemia   . Hypertension   . IBS (irritable bowel syndrome)   . Low back pain   . Thyroid disease     PAST SURGICAL HISTORY: Past Surgical History:  Procedure Laterality Date  . ABDOMINAL HYSTERECTOMY  1998  . CHOLECYSTECTOMY  1998  . [place] / 2013   skin graft on gums  . TUBAL LIGATION  1977    MEDICATIONS: Current Outpatient Prescriptions on File Prior to Visit  Medication Sig Dispense Refill  . ALPHAGAN P 0.1 % SOLN     . aspirin 81 MG tablet Take 160 mg by mouth daily.    . calcium carbonate (OS-CAL) 600  MG TABS Take 600 mg by mouth 2 (two) times daily with a meal.    . levothyroxine (SYNTHROID, LEVOTHROID) 50 MCG tablet TAKE 1 TABLET (50 MCG TOTAL) BY MOUTH DAILY. 30 tablet 5  . Multiple Vitamin (MULTIVITAMIN) tablet Take 1 tablet by mouth daily.    . pravastatin (PRAVACHOL) 40 MG tablet TAKE 1 TABLET (40 MG TOTAL) BY MOUTH DAILY. REPEAT LABS ARE DUE NOW 30 tablet 0  . Probiotic Product (ALIGN) 4 MG CAPS Take 1 capsule by mouth daily.    . valsartan (DIOVAN) 160 MG tablet Take 1 tablet (160 mg total) by mouth daily. 90  tablet 1   No current facility-administered medications on file prior to visit.     ALLERGIES: No Known Allergies  FAMILY HISTORY: Family History  Problem Relation Age of Onset  . Macular degeneration Father   . Prostate cancer Father   . Hypertension Father   . Cancer Father     prostate  . Glaucoma Mother   . Emphysema Mother   . Hypertension Mother   . Other Brother     Brain tumor  . Cancer Brother 71    brain tumor  . Breast cancer Maternal Grandmother   . Prostate cancer Brother     SOCIAL HISTORY: Social History   Social History  . Marital status: Married    Spouse name: N/A  . Number of children: N/A  . Years of education: N/A   Occupational History  . retired Unemployed   Social History Main Topics  . Smoking status: Never Smoker  . Smokeless tobacco: Never Used  . Alcohol use No  . Drug use: No  . Sexual activity: Yes    Partners: Male   Other Topics Concern  . Not on file   Social History Narrative   Exercise--no    REVIEW OF SYSTEMS: Constitutional: No fevers, chills, or sweats, no generalized fatigue, change in appetite Eyes: No visual changes, double vision, eye pain Ear, nose and throat: No hearing loss, ear pain, nasal congestion, sore throat Cardiovascular: No chest pain, palpitations Respiratory:  No shortness of breath at rest or with exertion, wheezes GastrointestinaI: No nausea, vomiting, diarrhea, abdominal pain, fecal incontinence Genitourinary:  No dysuria, urinary retention or frequency Musculoskeletal:  + occl neck pain, back pain Integumentary: No rash, pruritus, skin lesions Neurological: as above Psychiatric: No depression, insomnia, anxiety Endocrine: No palpitations, fatigue, diaphoresis, mood swings, change in appetite, change in weight, increased thirst Hematologic/Lymphatic:  No anemia, purpura, petechiae. Allergic/Immunologic: no itchy/runny eyes, nasal congestion, recent allergic reactions, rashes  PHYSICAL  EXAM: Vitals:   04/02/16 1345  BP: 120/82  Pulse: 72   General: No acute distress Head:  Normocephalic/atraumatic Eyes: Fundoscopic exam shows bilateral sharp discs, no vessel changes, exudates, or hemorrhages Neck: supple, no paraspinal tenderness, full range of motion Back: No paraspinal tenderness Heart: regular rate and rhythm Lungs: Clear to auscultation bilaterally. Vascular: No carotid bruits. Skin/Extremities: No rash, no edema Neurological Exam: Mental status: alert and oriented to person, place, and time, no dysarthria or aphasia, Fund of knowledge is appropriate.  Remote memory intact.  Attention and concentration are normal.    Able to name objects and repeat phrases.  Montreal Cognitive Assessment  04/02/2016  Visuospatial/ Executive (0/5) 3  Naming (0/3) 3  Attention: Read list of digits (0/2) 1  Attention: Read list of letters (0/1) 1  Attention: Serial 7 subtraction starting at 100 (0/3) 3  Language: Repeat phrase (0/2) 2  Language : Fluency (0/1)  1  Abstraction (0/2) 1  Delayed Recall (0/5) 1  Orientation (0/6) 5  Total 21  Adjusted Score (based on education) 21   Cranial nerves: CN I: not tested CN II: pupils equal, round and reactive to light, visual fields intact, fundi unremarkable. CN III, IV, VI:  full range of motion, no nystagmus, no ptosis CN V: facial sensation intact CN VII: upper and lower face symmetric CN VIII: hearing intact to finger rub CN IX, X: gag intact, uvula midline CN XI: sternocleidomastoid and trapezius muscles intact CN XII: tongue midline Bulk & Tone: normal, no fasciculations. Motor: 5/5 throughout with no pronator drift. Sensation: intact to light touch, cold, pin, vibration and joint position sense.  No extinction to double simultaneous stimulation.  Romberg test negative Deep Tendon Reflexes: +2 throughout, no ankle clonus Plantar responses: downgoing bilaterally Cerebellar: no incoordination on finger to nose, heel to  shin. No dysdiadochokinesia Gait: narrow-based and steady, able to tandem walk adequately. Tremor: none  IMPRESSION: This is a pleasant 70 year old right-handed woman with a history of hypertension, hyperlipidemia, hypothyroidism, presenting for evaluation of memory loss over the past year. Her neurological exam is non-focal, MOCA score 21/30, indicating mild cognitive impairment. TSH and B12 normal. MRI brain without contrast will be ordered to assess for underlying structural abnormality. We discussed that Mild cognitive impairment means there are serious cognitive problems by report and testing but the patient is functioning normally. Around 50% of MCI patients progress to dementia (functional impairment) over 5 years. Dementia implies that ADLs are currently compromised. We discussed the option to start a low dose cholinesterase inhibitor such as Aricept, side effects and expectations from the medication were discussed. We also discussed mood changes, and how mood can affect memory. Her husband feels she would benefit more from an SSRI than Aricept at this time, she will start Paxil 5mg  daily for 1 week, then increase to 10mg  daily. Side effects were discussed. The importance of control of vascular risk factors, physical exercise and brain stimulation exercises were discussed. She will follow-up in 3 months and knows to call for any changes.   Thank you for allowing me to participate in the care of this patient. Please do not hesitate to call for any questions or concerns.   Ellouise Newer, M.D.  CC: Dr. Cheri Rous

## 2016-04-02 NOTE — Patient Instructions (Addendum)
1. Schedule MRI brain without contrast. We have sent a referral to [facility] for your MRI and they will call you directly to schedule your appt. They are located at [place]. If you need to contact them directly please call [phone]. 2. Start Paroxetine 10mg : Take 1/2 tablet daily for 1 week, then increase to 1 tablet daily 3. Control of BP, cholesterol, as well as physical exercise and brain stimulation exercises are important for brain health 4. Follow-up in 3 months

## 2016-04-10 ENCOUNTER — Other Ambulatory Visit (INDEPENDENT_AMBULATORY_CARE_PROVIDER_SITE_OTHER): Payer: Medicare HMO

## 2016-04-10 DIAGNOSIS — I10 Essential (primary) hypertension: Secondary | ICD-10-CM

## 2016-04-10 LAB — BASIC METABOLIC PANEL
BUN: 13 mg/dL (ref 6–23)
CHLORIDE: 105 meq/L (ref 96–112)
CO2: 30 meq/L (ref 19–32)
CREATININE: 0.86 mg/dL (ref 0.40–1.20)
Calcium: 9.5 mg/dL (ref 8.4–10.5)
GFR: 69.31 mL/min (ref >60.00)
Glucose, Bld: 95 mg/dL (ref 70–99)
Potassium: 4.3 mEq/L (ref 3.5–5.1)
Sodium: 141 mEq/L (ref 135–145)

## 2016-04-25 ENCOUNTER — Ambulatory Visit
Admission: RE | Admit: 2016-04-25 | Discharge: 2016-04-25 | Disposition: A | Discharge status: Home / Self Care | Payer: Medicare HMO | Source: Ambulatory Visit | Attending: Neurology | Admitting: Neurology

## 2016-04-25 DIAGNOSIS — G3184 Mild cognitive impairment, so stated: Secondary | ICD-10-CM

## 2016-04-25 DIAGNOSIS — G9389 Other specified disorders of brain: Secondary | ICD-10-CM | POA: Diagnosis not present

## 2016-04-29 ENCOUNTER — Telehealth: Payer: Self-pay | Admitting: Neurology

## 2016-04-29 ENCOUNTER — Other Ambulatory Visit: Payer: Self-pay | Admitting: *Deleted

## 2016-04-29 DIAGNOSIS — D329 Benign neoplasm of meninges, unspecified: Secondary | ICD-10-CM

## 2016-04-29 NOTE — Telephone Encounter (Signed)
MRI ordered

## 2016-04-29 NOTE — Telephone Encounter (Signed)
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 Pt expressed understanding.  [name], pls send order for MRI brain with contrast. Dx meningioma. Pt requests scan be done on a Friday. Thanks!

## 2016-04-30 ENCOUNTER — Telehealth: Payer: Self-pay | Admitting: Neurology

## 2016-04-30 NOTE — Telephone Encounter (Signed)
PT"s daughter Sharee Pimple called and wanted a call back with the results of her mom's MRI/Dawn CB#[phone]

## 2016-05-01 ENCOUNTER — Other Ambulatory Visit: Payer: Self-pay | Admitting: Family Medicine

## 2016-05-01 NOTE — Telephone Encounter (Signed)
Spoke to daughter about MRI showing meningioma, explained these findings and need for contrast study and interval f/u annually for now. She expressed understanding

## 2016-05-11 ENCOUNTER — Other Ambulatory Visit: Payer: Self-pay | Admitting: Family Medicine

## 2016-05-13 ENCOUNTER — Encounter: Payer: Self-pay | Admitting: Family Medicine

## 2016-05-15 ENCOUNTER — Other Ambulatory Visit: Payer: Self-pay

## 2016-05-15 ENCOUNTER — Telehealth: Payer: Self-pay | Admitting: Neurology

## 2016-05-15 DIAGNOSIS — D329 Benign neoplasm of meninges, unspecified: Secondary | ICD-10-CM

## 2016-05-15 NOTE — Telephone Encounter (Signed)
Order for MRI Brain w contrast was cancelled by Cristopher Peru at [facility] on 04/30/16.  Called Cathy at GI, she states that she thought the MRI order was a duplicate order that why she cancelled it.  Lynelle Doctor the newest order was with contrast.  Tye Maryland states that a new order will need to be completed and she will call the patient to schedule an appt.  Called pt's daughter and advised her of what happened and that GI will be contacting them to schedule.

## 2016-05-15 NOTE — Telephone Encounter (Signed)
Patient daughter Sharee Pimple wants to check the status of the MRI that needed to be done. They have not heard anything please call Sharee Pimple [phone]

## 2016-05-26 DIAGNOSIS — M25512 Pain in left shoulder: Secondary | ICD-10-CM | POA: Diagnosis not present

## 2016-05-26 DIAGNOSIS — M7542 Impingement syndrome of left shoulder: Secondary | ICD-10-CM | POA: Diagnosis not present

## 2016-05-30 ENCOUNTER — Ambulatory Visit
Admission: RE | Admit: 2016-05-30 | Discharge: 2016-05-30 | Disposition: A | Discharge status: Home / Self Care | Payer: Medicare HMO | Source: Ambulatory Visit | Attending: Neurology | Admitting: Neurology

## 2016-05-30 DIAGNOSIS — D329 Benign neoplasm of meninges, unspecified: Secondary | ICD-10-CM

## 2016-05-30 DIAGNOSIS — G939 Disorder of brain, unspecified: Secondary | ICD-10-CM | POA: Diagnosis not present

## 2016-06-02 ENCOUNTER — Telehealth: Payer: Self-pay | Admitting: Neurology

## 2016-06-02 NOTE — Telephone Encounter (Signed)
Pt's daughter Sharee Pimple made appt for 9/28.

## 2016-06-02 NOTE — Telephone Encounter (Signed)
Michelle [name] [birth year]. Her daughter 70 [address]   wanting to see if her mom can see  Dr. Delice Lesch Sooner due to 2nd MRI results. She would like her family to come with her to hear results. Her daughter said she is having more headaches and memory issues. Thank you

## 2016-06-02 NOTE — Telephone Encounter (Signed)
If I have an opening, okay to come in earlier, thanks

## 2016-06-02 NOTE — Telephone Encounter (Signed)
Pt has appt 11/17. Do you want her to come in earlier?

## 2016-06-04 MED ORDER — GADOBENATE DIMEGLUMINE 529 MG/ML IV SOLN
15.0000 mL | Freq: Once | INTRAVENOUS | Status: AC | PRN
  Administered 2016-06-04: 13 mL via INTRAVENOUS

## 2016-06-05 ENCOUNTER — Ambulatory Visit (INDEPENDENT_AMBULATORY_CARE_PROVIDER_SITE_OTHER): Payer: Medicare HMO | Admitting: Neurology

## 2016-06-05 ENCOUNTER — Encounter: Payer: Self-pay | Admitting: Neurology

## 2016-06-05 VITALS — BP 112/70 | HR 70 | Ht 63.0 in | Wt 136.0 lb

## 2016-06-05 DIAGNOSIS — G3184 Mild cognitive impairment, so stated: Secondary | ICD-10-CM

## 2016-06-05 DIAGNOSIS — D329 Benign neoplasm of meninges, unspecified: Secondary | ICD-10-CM | POA: Diagnosis not present

## 2016-06-05 MED ORDER — DONEPEZIL HCL 10 MG PO TABS: ORAL_TABLET | ORAL | 6 refills | Status: DC

## 2016-06-05 NOTE — Progress Notes (Signed)
NEUROLOGY FOLLOW UP OFFICE NOTE  EVANNY FASO [id number]  HISTORY OF PRESENT ILLNESS: I had the pleasure of seeing Michelle [name] in follow-up in the neurology clinic on 06/05/2016.  The patient was last seen 2 months ago for worsening memory. She is again accompanied by her husband and daughter who help supplement the history today.  Records and images were personally reviewed where available.  They come for an earlier visit to review MRI findings. Her MRI without contrast did not show any acute intracranial changes, there was note of a meningioma in the dorsal clivus with mild mass effect on the post. Post-contrast MRI was ordered, which again showed the 22mm enhancing dural-based mass posterior to the clivus on the right. There is mass effect on the anterior pons on the right which is mildly compressed. The mass is touching the basilar causing mild displacement to the left. There is no definite invasion of the sella. It may be touching the cisternal segment of the trigeminal nerve.   Since her initial visit, she was started on Paxil. They are unsure if it has helped with anxiety. She gets upset with her husband when told she repeats herself or has been told something she forgot. She has been having more headaches since starting the medication, with a pressure over the right frontal region. Tylenol helps for a while. She has had some sinus issues and post-nasal drip but had stopped her allergy medication. Her daughter reports her memory is worsening, she had difficulty spelling on a form for the MRI and had to be reminded several times what to write. She continues to work part time 4 days a week and has 1-2 bad days where she types in the wrong thing, but always rechecks herself. She has not been written up at work.   HPI 04/02/16: This is a pleasant 70 yo RH woman with a history of hypertension, hyperlipidemia, hypothyroidism, who presented for evaluation of memory loss. Her daughter started noticing  memory changes a year ago. The patient states she can tell a difference in her memory, but that her family notices it more and "they may think I'm in denial." Her family has noticed worsening over the past 6 months, she repeats herself several times, sometimes having the same conversation four times within an hour. Her husband reports that she asked him repeatedly last 109th of July weekend what he was wearing to go home. She misplaces things frequently in the house. She left the stove on one time boiling water. She takes longer to get ready to go out. She denies getting lost driving, no missed bills or medications. 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 Her family also expressed concern about personality changes, she used to be laidback but now her patience is not there. She is cussing a lot more easily now. She doesn't want to do things she used to like. She does not like going out, and stayed in the room while at the beach more than with her family. No paranoia. No difficulties with ADLs. There is no family history of dementia, she denies any history of head injuries, alcohol intake. She has been having dull right frontal headaches for the past 6 months attributed to her sinuses, no associated nausea/vomiting. She denies any dizziness, diplopia, dysarthria/dysphagia, focal numbness/tingling/weakness, bowel/bladder dysfunction. She has occasional neck and left shoulder pain. No falls.   She had an MMSE done  with her PCP this month, MMSE 25/30. TSH and B12 unremarkable. She has been unable to do the MRI brain. PAST MEDICAL HISTORY: Past Medical History:  Diagnosis Date  . Adenomatous colon polyp   . Allergy   . GERD (gastroesophageal reflux disease)   . Glaucoma   . Hx of hearing loss    bilateral hearing aids  . Hyperlipidemia   . Hypertension   . IBS (irritable bowel syndrome)   . Low back pain   . Thyroid disease      MEDICATIONS: Current Outpatient Prescriptions on File Prior to Visit  Medication Sig Dispense Refill  . ALPHAGAN P 0.1 % SOLN     . aspirin 81 MG tablet Take 160 mg by mouth daily.    . calcium carbonate (OS-CAL) 600 MG TABS Take 600 mg by mouth 2 (two) times daily with a meal.    . levothyroxine (SYNTHROID, LEVOTHROID) 50 MCG tablet TAKE 1 TABLET (50 MCG TOTAL) BY MOUTH DAILY. 30 tablet 5  . Multiple Vitamin (MULTIVITAMIN) tablet Take 1 tablet by mouth daily.    Marland Kitchen PARoxetine (PAXIL) 10 MG tablet Take 1/2 tablet daily for 1 week, then increase to 1 tablet 30 tablet 6  . pravastatin (PRAVACHOL) 40 MG tablet Take 1 tablet (40 mg total) by mouth daily. 90 tablet 1  . Probiotic Product (ALIGN) 4 MG CAPS Take 1 capsule by mouth daily.    . valsartan (DIOVAN) 160 MG tablet Take 1 tablet (160 mg total) by mouth daily. 90 tablet 1   No current facility-administered medications on file prior to visit.     ALLERGIES: No Known Allergies  FAMILY HISTORY: Family History  Problem Relation Age of Onset  . Macular degeneration Father   . Prostate cancer Father   . Hypertension Father   . Cancer Father     prostate  . Glaucoma Mother   . Emphysema Mother   . Hypertension Mother   . Other Brother     Brain tumor  . Cancer Brother 3    brain tumor  . Breast cancer Maternal Grandmother   . Prostate cancer Brother     SOCIAL HISTORY: Social History   Social History  . Marital status: Married    Spouse name: N/A  . Number of children: N/A  . Years of education: N/A   Occupational History  . retired Unemployed   Social History Main Topics  . Smoking status: Never Smoker  . Smokeless tobacco: Never Used  . Alcohol use No  . Drug use: No  . Sexual activity: Yes    Partners: Male   Other Topics Concern  . Not on file   Social History Narrative   Exercise--no    REVIEW OF SYSTEMS: Constitutional: No fevers, chills, or sweats, no generalized fatigue, change in  appetite Eyes: No visual changes, double vision, eye pain Ear, nose and throat: No hearing loss, ear pain, nasal congestion, sore throat Cardiovascular: No chest pain, palpitations Respiratory:  No shortness of breath at rest or with exertion, wheezes GastrointestinaI: No nausea, vomiting, diarrhea, abdominal pain, fecal incontinence Genitourinary:  No dysuria, urinary retention or frequency Musculoskeletal:  No neck pain, back pain Integumentary: No rash, pruritus, skin lesions Neurological: as above Psychiatric: No depression, insomnia, anxiety Endocrine: No palpitations, fatigue, diaphoresis, mood swings, change in appetite, change in weight, increased thirst Hematologic/Lymphatic:  No anemia, purpura, petechiae. Allergic/Immunologic: no itchy/runny eyes, nasal congestion, recent allergic reactions, rashes  PHYSICAL EXAM: Vitals:   06/05/16 1525  BP:  112/70  Pulse: 70   General: No acute distress Head:  Normocephalic/atraumatic Skin/Extremities: No rash, no edema Neurological Exam: alert and oriented. Fund of knowledge is appropriate. Attention and concentration are normal.   Cranial nerves: Pupils equal, round, No facial asymmetry. Motor: moves all extremities symmetrically.Gait narrow-based and steady.  IMPRESSION: This is a pleasant 70 yo RH woman with a history of hypertension, hyperlipidemia, hypothyroidism, who presented for worsening memory. MOCA score in July 2017 was 21/30, indicating mild cognitive impairment. She continues to work part time without significant difficulties, however her family continues to notice some cognitive decline. On previous visit, they opted to start Paxil. She feels she is having more headaches and will hold medication and assess if headaches resolve. They would like to proceed with starting Aricept, side effects and expectations from the medication were discussed. We discussed MRI findings of incidental finding of meningioma in the clivus region, she  is asymptomatic from this. A follow-up scan will be done in 1 year or earlier if symptoms change. We discussed that this would not cause the cognitive symptoms. They ask about working, we discussed doing Neuropsychological evaluation to further evaluate symptoms, she would like to start Aricept for now and reassess in 3 months. We again discussed the importance of control of vascular risk factors, physical exercise and brain stimulation exercises for brain health. She will follow-up in 3 months and knows to call for any changes.   Thank you for allowing me to participate in her care.  Please do not hesitate to call for any questions or concerns.  The duration of this appointment visit was 25 minutes of face-to-face time with the patient.  Greater than 50% of this time was spent in counseling, explanation of diagnosis, planning of further management, and coordination of care.   Ellouise Newer, M.D.   CC: Dr. Cheri Rous

## 2016-06-05 NOTE — Patient Instructions (Signed)
1. Start Aricept 10mg : take 1/2 tablet daily for 1 month, then increase to 1 tablet daily 2. Try stopping Paxil for 5 days and see how headaches are. If no change, restart medication. 3. Look into [email or web address] for online brain exercises 4. Follow-up in 3 months, call for any changes

## 2016-06-06 ENCOUNTER — Telehealth: Payer: Self-pay | Admitting: Neurology

## 2016-06-06 NOTE — Telephone Encounter (Signed)
Please fax rx for ARICEPT  The drug store did not receive our script per the husband of the patient.

## 2016-06-06 NOTE — Telephone Encounter (Signed)
Notified pt RX was called in to pharmacy.

## 2016-06-09 ENCOUNTER — Ambulatory Visit: Payer: Medicare HMO

## 2016-06-20 ENCOUNTER — Encounter: Payer: Medicare HMO | Admitting: Family Medicine

## 2016-07-10 ENCOUNTER — Other Ambulatory Visit: Payer: Self-pay | Admitting: Family Medicine

## 2016-07-14 DIAGNOSIS — M65262 Calcific tendinitis, left lower leg: Secondary | ICD-10-CM | POA: Diagnosis not present

## 2016-07-14 DIAGNOSIS — M25562 Pain in left knee: Secondary | ICD-10-CM | POA: Diagnosis not present

## 2016-07-21 DIAGNOSIS — M47892 Other spondylosis, cervical region: Secondary | ICD-10-CM | POA: Diagnosis not present

## 2016-07-21 DIAGNOSIS — S161XXA Strain of muscle, fascia and tendon at neck level, initial encounter: Secondary | ICD-10-CM | POA: Diagnosis not present

## 2016-07-25 ENCOUNTER — Ambulatory Visit: Payer: Medicare HMO | Admitting: Neurology

## 2016-08-07 DIAGNOSIS — M542 Cervicalgia: Secondary | ICD-10-CM | POA: Diagnosis not present

## 2016-08-07 DIAGNOSIS — M25512 Pain in left shoulder: Secondary | ICD-10-CM | POA: Diagnosis not present

## 2016-08-07 DIAGNOSIS — M4692 Unspecified inflammatory spondylopathy, cervical region: Secondary | ICD-10-CM | POA: Diagnosis not present

## 2016-08-15 ENCOUNTER — Encounter: Payer: Medicare HMO | Admitting: Family Medicine

## 2016-08-22 DIAGNOSIS — J370 Chronic laryngitis: Secondary | ICD-10-CM | POA: Diagnosis not present

## 2016-08-22 DIAGNOSIS — J320 Chronic maxillary sinusitis: Secondary | ICD-10-CM | POA: Diagnosis not present

## 2016-08-22 DIAGNOSIS — J322 Chronic ethmoidal sinusitis: Secondary | ICD-10-CM | POA: Diagnosis not present

## 2016-08-27 ENCOUNTER — Ambulatory Visit (INDEPENDENT_AMBULATORY_CARE_PROVIDER_SITE_OTHER): Payer: Medicare HMO | Admitting: Physician Assistant

## 2016-08-27 ENCOUNTER — Telehealth: Payer: Self-pay | Admitting: *Deleted

## 2016-08-27 ENCOUNTER — Other Ambulatory Visit (INDEPENDENT_AMBULATORY_CARE_PROVIDER_SITE_OTHER): Payer: Medicare HMO

## 2016-08-27 ENCOUNTER — Encounter: Payer: Self-pay | Admitting: Physician Assistant

## 2016-08-27 VITALS — BP 108/60 | HR 72 | Ht 62.5 in | Wt 128.1 lb

## 2016-08-27 DIAGNOSIS — R630 Anorexia: Secondary | ICD-10-CM

## 2016-08-27 DIAGNOSIS — R1084 Generalized abdominal pain: Secondary | ICD-10-CM

## 2016-08-27 DIAGNOSIS — R05 Cough: Secondary | ICD-10-CM

## 2016-08-27 DIAGNOSIS — R634 Abnormal weight loss: Secondary | ICD-10-CM

## 2016-08-27 DIAGNOSIS — R140 Abdominal distension (gaseous): Secondary | ICD-10-CM

## 2016-08-27 DIAGNOSIS — R053 Chronic cough: Secondary | ICD-10-CM

## 2016-08-27 LAB — COMPREHENSIVE METABOLIC PANEL
ALBUMIN: 4.2 g/dL (ref 3.5–5.2)
ALK PHOS: 45 U/L (ref 39–117)
ALT: 15 U/L (ref 0–35)
AST: 18 U/L (ref 0–37)
BILIRUBIN TOTAL: 0.5 mg/dL (ref 0.2–1.2)
BUN: 17 mg/dL (ref 6–23)
CALCIUM: 9.1 mg/dL (ref 8.4–10.5)
CHLORIDE: 104 meq/L (ref 96–112)
CO2: 29 mEq/L (ref 19–32)
CREATININE: 0.76 mg/dL (ref 0.40–1.20)
GFR: 79.84 mL/min (ref >60.00)
Glucose, Bld: 104 mg/dL — ABNORMAL HIGH (ref 70–99)
Potassium: 3.6 mEq/L (ref 3.5–5.1)
SODIUM: 140 meq/L (ref 135–145)
TOTAL PROTEIN: 6.8 g/dL (ref 6.0–8.3)

## 2016-08-27 LAB — CBC WITH DIFFERENTIAL/PLATELET
BASOS ABS: 0 10*3/uL (ref 0.0–0.1)
BASOS PCT: 0.3 % (ref 0.0–3.0)
EOS ABS: 0.1 10*3/uL (ref 0.0–0.7)
Eosinophils Relative: 1 % (ref 0.0–5.0)
HEMATOCRIT: 35.8 % — AB (ref 36.0–46.0)
HEMOGLOBIN: 12.1 g/dL (ref 12.0–15.0)
LYMPHS PCT: 24.9 % (ref 12.0–46.0)
Lymphs Abs: 1.5 10*3/uL (ref 0.7–4.0)
MCHC: 33.8 g/dL (ref 30.0–36.0)
MCV: 85.5 fl (ref 78.0–100.0)
MONO ABS: 0.6 10*3/uL (ref 0.1–1.0)
Monocytes Relative: 9.5 % (ref 3.0–12.0)
Neutro Abs: 4 10*3/uL (ref 1.4–7.7)
Neutrophils Relative %: 64.3 % (ref 43.0–77.0)
Platelets: 215 10*3/uL (ref 150.0–400.0)
RBC: 4.18 Mil/uL (ref 3.87–5.11)
RDW: 13.2 % (ref 11.5–15.5)
WBC: 6.2 10*3/uL (ref 4.0–10.5)

## 2016-08-27 NOTE — Progress Notes (Signed)
Subjective:    Patient ID: Michelle [name], female    DOB: 11-09-1945, 70 y.o.   MRN: [id number]  HPI Chole is a pleasant 70 year old white female, known to Dr. Henrene Pastor who was last seen here in 2014 at which time she had undergone colonoscopy. She comes in today with her husband with complaints of intermittent abdominal pain and bloating over the past 5-6 weeks. She has also had a 30 pound weight loss over the past 3 years. Her husband feels that she has lost some weight over the past 6 months as well.   Patient has history of hypertension, IBS, diverticulosis, history of a meningioma and mild cognitive impairment for which she is now on Aricept over the past 4-5 months. She also has history of GERD and an adenomatous colon polyps. Colonoscopy done in January 2014 with finding of one diminutive polyp in the sigmoid colon which was removed and moderate diverticulosis. Path from  polyp consistent with a tubular adenoma. Patient states that over the past 5-6 weeks she has had intermittent mid abdominal bloating and gassiness and fullness especially postprandially. He says she has had long-term mild constipation usually goes 2-3 days between bowel movements. She's not noted any melena or hematochezia. She has gone back to work recently Marcello Moores says she eats breakfast late generally is not hungry at lunchtime and eats very little. Her says husband says she comes home in the evening, is tired and generally doesn't eat a good dinner either. She does feel that heavy foods or greasy foods make her symptoms worse. She has not started any new medicines in the past couple of months but was started on Aricept about 6 months ago. Patient's husband is also concerned about a chronic cough. He says she has been coughing for over a year but usually worse in the mornings. Patient herself doesn't seem to be bothered by the cough. Her husband says she coughs off and on throughout the day. This been nonproductive. She has no  current GERD symptoms.  Review of Systems Pertinent positive and negative review of systems were noted in the above HPI section.  All other review of systems was otherwise negative.  Outpatient Encounter Prescriptions as of 08/27/2016  Medication Sig  . ALPHAGAN P 0.1 % SOLN   . aspirin 81 MG tablet Take 160 mg by mouth daily.  . calcium carbonate (OS-CAL) 600 MG TABS Take 600 mg by mouth 2 (two) times daily with a meal.  . donepezil (ARICEPT) 10 MG tablet Take 1/2 tablet daily for 1 month, then increase to 1 tablet daily  . levothyroxine (SYNTHROID, LEVOTHROID) 50 MCG tablet TAKE 1 TABLET (50 MCG TOTAL) BY MOUTH DAILY.  . Multiple Vitamin (MULTIVITAMIN) tablet Take 1 tablet by mouth daily.  Marland Kitchen PARoxetine (PAXIL) 10 MG tablet Take 1/2 tablet daily for 1 week, then increase to 1 tablet  . pravastatin (PRAVACHOL) 40 MG tablet Take 1 tablet (40 mg total) by mouth daily.  . Probiotic Product (ALIGN) 4 MG CAPS Take 1 capsule by mouth daily.  . valsartan (DIOVAN) 160 MG tablet Take 1 tablet (160 mg total) by mouth daily.   No facility-administered encounter medications on file as of 08/27/2016.    No Known Allergies Patient Active Problem List   Diagnosis Date Noted  . Meningioma ([place]) 06/05/2016  . Mild cognitive impairment 04/02/2016  . Depression 04/02/2016  . Memory loss 03/21/2016  . POSTMENOPAUSAL STATUS 07/26/2009  . SINUSITIS, ACUTE 01/17/2009  . INTESTINAL GAS 09/18/2008  .  ONYCHOMYCOSIS, TOENAILS 08/14/2008  . GLAUCOMA 07/24/2008  . HYPERTENSION 07/24/2008  . ACUTE FRONTAL SINUSITIS 03/24/2008  . MYALGIA 02/29/2008  . COUGH 07/26/2007  . ADENOMATOUS COLONIC POLYP 05/20/2007  . DIVERTICULOSIS, COLON 05/20/2007  . HYPOTHYROIDISM 04/07/2007  . HYPERLIPIDEMIA 04/07/2007  . GERD 04/07/2007  . IBS 04/07/2007   Social History   Social History  . Marital status: Married    Spouse name: N/A  . Number of children: N/A  . Years of education: N/A   Occupational History  .  retired Unemployed   Social History Main Topics  . Smoking status: Never Smoker  . Smokeless tobacco: Never Used  . Alcohol use No  . Drug use: No  . Sexual activity: Yes    Partners: Male   Other Topics Concern  . Not on file   Social History Narrative   Exercise--no    Ms. Lenahan's family history includes Breast cancer in her maternal grandmother; Cancer in her father; Cancer (age of onset: 68) in her brother; Emphysema in her mother; Glaucoma in her mother; Hypertension in her father and mother; Macular degeneration in her father; Other in her brother; Prostate cancer in her brother and father.      Objective:    Vitals:   08/27/16 1428  BP: 108/60  Pulse: 72    Physical Exam Well-developed older white female in no acute distress, pleasant accompanied by her husband who gives part of history- blood pressure 108/60 pulse 72, height 5 foot 2 weight 128 BMI of 23.0. HEENT; nontraumatic ,normocephalic, EOMI PERRLA sclera anicteric, Cardiovascular; regular rate and rhythm with S1-S2 no murmur or gallop, Pulmonary; clear bilaterally, Abdomen; soft no focal tenderness no guarding or rebound no palpable mass or hepatosplenomegaly bowel sounds present, Rectal ;exam not done, Ext; no clubbing cyanosis or edema skin warm and dry, Neuropsych; mood and affect appropriate      Assessment & Plan:   #42 70 year old white female with 5-6 week history of intermittent abdominal discomfort, bloating postprandial gassiness and fullness superimposed on chronic mild constipation. I think her symptoms are related to IBS but will need to rule out underlying malignancy or intra-abdominal inflammatory process #2 weight loss-multifactorial, patient did start Aricept about 6 months ago which is associated with decrease in appetite and weight loss #3 hypertension #4 diverticulosis #5 history of adenomatous colon polyps will be due for follow-up colonoscopy January 2019 #6 chronic cough-no current  symptoms concerning for significant acid reflux contributing  Plan; Will check CBC with differential, CMET Schedule for CT of the abdomen and pelvis with contrast Low gas diet Start trial of IBgard one by mouth with each meal If CT scan is negative may consider a course of Xifaxan for bacterial overgrowth Patient's husband requests referral for her chronic cough and we will arrange for pulmonary consult Patient is encouraged to try to improve her eating habits Other plans pending results of above  Nyala Kirchner Genia Harold PA-C 08/27/2016   Cc: Ann Held, *

## 2016-08-27 NOTE — Telephone Encounter (Signed)
I called the patient and left a message on her voice mail of her cell phone.  Her appointment is with Dr. Baltazar Apo.  10-02-2016 at 11:00 am. She is to arrive at 10:45 am.

## 2016-08-27 NOTE — Patient Instructions (Signed)
Please go to the basement level to have your labs drawn.  We have given you a coupon and sample of IB Gard, Take 1 capsule before each meal.  You can get this at [employer], [facility], [facility].  Low Gas Diet handout has been provided.    We are doing a referral to Pulmonary. You will get a call with an appointment.    You have been scheduled for a CT scan of the abdomen and pelvis at [facility] ([address]---this is in the same building as [facility]).   You are scheduled on Friday  12-22-2017at 3:30 PM. You should arrive at 3:15 minutes prior to your appointment time for registration. Please follow the written instructions below on the day of your exam:  WARNING: IF YOU ARE ALLERGIC TO IODINE/X-RAY DYE, PLEASE NOTIFY RADIOLOGY IMMEDIATELY AT [phone]! YOU WILL BE GIVEN A 13 HOUR PREMEDICATION PREP.  1) Do not eat  anything after 11:30 AM (4 hours prior to your test) 2) You have been given 2 bottles of oral contrast to drink. The solution may taste               better if refrigerated, but do NOT add ice or any other liquid to this solution. Shake             well before drinking.    Drink 1 bottle of contrast @ 1:30 PM (2 hours prior to your exam)  Drink 1 bottle of contrast @ 2:30 PM (1 hour prior to your exam)  You may take any medications as prescribed with a small amount of water except for the following: Metformin, Glucophage, Glucovance, Avandamet, Riomet, Fortamet, Actoplus Met, Janumet, Glumetza or Metaglip. The above medications must be held the day of the exam AND 48 hours after the exam.  The purpose of you drinking the oral contrast is to aid in the visualization of your intestinal tract. The contrast solution may cause some diarrhea. Before your exam is started, you will be given a small amount of fluid to drink. Depending on your individual set of symptoms, you may also receive an intravenous injection of x-ray contrast/dye. Plan on being at [facility] for 30 minutes or long, depending on the type of exam you are having performed.  If you have any questions regarding your exam or if you need to reschedule, you may call the CT department at [phone] between the hours of 8:00 am and 5:00 pm, Monday-Friday.  ________________________________________________________________________

## 2016-08-28 DIAGNOSIS — M4692 Unspecified inflammatory spondylopathy, cervical region: Secondary | ICD-10-CM | POA: Diagnosis not present

## 2016-08-28 DIAGNOSIS — M25512 Pain in left shoulder: Secondary | ICD-10-CM | POA: Diagnosis not present

## 2016-08-28 NOTE — Progress Notes (Signed)
Agree with initial assessment and plans as outlined. Michelle [name] to follow-up on testing

## 2016-08-29 ENCOUNTER — Ambulatory Visit (INDEPENDENT_AMBULATORY_CARE_PROVIDER_SITE_OTHER)
Admission: RE | Admit: 2016-08-29 | Discharge: 2016-08-29 | Disposition: A | Discharge status: Home / Self Care | Payer: Medicare HMO | Source: Ambulatory Visit | Attending: Physician Assistant | Admitting: Physician Assistant

## 2016-08-29 DIAGNOSIS — R1084 Generalized abdominal pain: Secondary | ICD-10-CM

## 2016-08-29 DIAGNOSIS — R630 Anorexia: Secondary | ICD-10-CM

## 2016-08-29 DIAGNOSIS — R634 Abnormal weight loss: Secondary | ICD-10-CM | POA: Diagnosis not present

## 2016-08-29 DIAGNOSIS — K5730 Diverticulosis of large intestine without perforation or abscess without bleeding: Secondary | ICD-10-CM | POA: Diagnosis not present

## 2016-08-29 DIAGNOSIS — R140 Abdominal distension (gaseous): Secondary | ICD-10-CM

## 2016-08-29 MED ORDER — IOPAMIDOL (ISOVUE-300) INJECTION 61%
100.0000 mL | Freq: Once | INTRAVENOUS | Status: AC | PRN
  Administered 2016-08-29: 100 mL via INTRAVENOUS

## 2016-09-09 ENCOUNTER — Telehealth: Payer: Self-pay | Admitting: Family Medicine

## 2016-09-09 NOTE — Telephone Encounter (Signed)
Pt's spouse called in to schedule pt for any missing immunizations that she may need. Pt would like to have her flu shot and any other. Please advise of any immunizations needed for scheduling.    Thanks.

## 2016-09-10 ENCOUNTER — Encounter: Payer: Self-pay | Admitting: Neurology

## 2016-09-10 ENCOUNTER — Other Ambulatory Visit (INDEPENDENT_AMBULATORY_CARE_PROVIDER_SITE_OTHER): Payer: Medicare HMO

## 2016-09-10 ENCOUNTER — Ambulatory Visit (INDEPENDENT_AMBULATORY_CARE_PROVIDER_SITE_OTHER): Payer: Medicare HMO | Admitting: Neurology

## 2016-09-10 VITALS — BP 114/62 | HR 82 | Ht 62.5 in | Wt 123.6 lb

## 2016-09-10 DIAGNOSIS — G3184 Mild cognitive impairment, so stated: Secondary | ICD-10-CM

## 2016-09-10 DIAGNOSIS — G44209 Tension-type headache, unspecified, not intractable: Secondary | ICD-10-CM

## 2016-09-10 MED ORDER — NORTRIPTYLINE HCL 10 MG PO CAPS: ORAL_CAPSULE | ORAL | 6 refills | Status: DC

## 2016-09-10 NOTE — Progress Notes (Signed)
NEUROLOGY FOLLOW UP OFFICE NOTE  Michelle [name] [id number]  HISTORY OF PRESENT ILLNESS: I had the pleasure of seeing Michelle [name] in follow-up in the neurology clinic on 06/05/2016.  The patient was last seen 2 months ago for worsening memory. She is again accompanied by her husband and daughter who help supplement the history today.  Records and images were personally reviewed where available. On her last visit, she felt she was having more headaches on Paxil and asked to stop the medication. She was started on Aricept and is tolerating this without any side effects. Off the Paxil, she reports that headaches are unchanged. She reports headaches occurring on a daily basis, on and off, with pressure in the temporal and frontal regions, sometimes it feels like her heartbeat. She had a bad headache last weekend that has eased off. No neck pain. She denies any vision changes, no focal numbness/tingling/weakness. Her daughter reports she is repeating herself more. She continues to work and denies any difficulties or mistakes at work. She denies getting lost driving. Her husband reports she is more irritable, argumentative, "on the mild side of hateful." She has lost weight and is not eating like she used to. She feels full, bloated, and "really uncomfortable."   HPI 04/02/16: This is a pleasant 71 yo RH woman with a history of hypertension, hyperlipidemia, hypothyroidism, who presented for evaluation of memory loss. Her daughter started noticing memory changes a year ago. The patient states she can tell a difference in her memory, but that her family notices it more and "they may think I'm in denial." Her family has noticed worsening over the past 6 months, she repeats herself several times, sometimes having the same conversation four times within an hour. Her husband reports that she asked him repeatedly last 50th of July weekend what he was wearing to go home. She misplaces things frequently in the house.  She left the stove on one time boiling water. She takes longer to get ready to go out. She denies getting lost driving, no missed bills or medications. 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 Her family also expressed concern about personality changes, she used to be laidback but now her patience is not there. She is cussing a lot more easily now. She doesn't want to do things she used to like. She does not like going out, and stayed in the room while at the beach more than with her family. No paranoia. No difficulties with ADLs. There is no family history of dementia, she denies any history of head injuries, alcohol intake. She has been having dull right frontal headaches for the past 6 months attributed to her sinuses, no associated nausea/vomiting. She denies any dizziness, diplopia, dysarthria/dysphagia, focal numbness/tingling/weakness, bowel/bladder dysfunction. She has occasional neck and left shoulder pain. No falls.   She had an MMSE done with her PCP this month, MMSE 25/30. TSH and B12 unremarkable. She has been unable to do the MRI brain.  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 Post-contrast MRI was ordered, which again showed the 40m enhancing dural-based mass posterior to the clivus on the right. There is mass effect on the anterior pons on the right which is mildly compressed. The mass is touching the basilar causing mild displacement to  the left. There is no definite invasion of the sella. It may be touching the cisternal segment of the trigeminal nerve.   PAST MEDICAL HISTORY: Past Medical History:  Diagnosis Date  . Adenomatous colon polyp   . Allergy   . GERD (gastroesophageal reflux disease)   . Glaucoma   . Hx of hearing loss    bilateral hearing aids  . Hyperlipidemia   .  Hypertension   . IBS (irritable bowel syndrome)   . Low back pain   . Thyroid disease     MEDICATIONS: Current Outpatient Prescriptions on File Prior to Visit  Medication Sig Dispense Refill  . ALPHAGAN P 0.1 % SOLN     . aspirin 81 MG tablet Take 160 mg by mouth daily.    . calcium carbonate (OS-CAL) 600 MG TABS Take 600 mg by mouth 2 (two) times daily with a meal.    . donepezil (ARICEPT) 10 MG tablet Take 1/2 tablet daily for 1 month, then increase to 1 tablet daily 30 tablet 6  . levothyroxine (SYNTHROID, LEVOTHROID) 50 MCG tablet TAKE 1 TABLET (50 MCG TOTAL) BY MOUTH DAILY. 30 tablet 5  . Multiple Vitamin (MULTIVITAMIN) tablet Take 1 tablet by mouth daily.    Marland Kitchen PARoxetine (PAXIL) 10 MG tablet Take 1/2 tablet daily for 1 week, then increase to 1 tablet 30 tablet 6  . pravastatin (PRAVACHOL) 40 MG tablet Take 1 tablet (40 mg total) by mouth daily. 90 tablet 1  . Probiotic Product (ALIGN) 4 MG CAPS Take 1 capsule by mouth daily.    . valsartan (DIOVAN) 160 MG tablet Take 1 tablet (160 mg total) by mouth daily. 90 tablet 0   No current facility-administered medications on file prior to visit.     ALLERGIES: No Known Allergies  FAMILY HISTORY: Family History  Problem Relation Age of Onset  . Macular degeneration Father   . Prostate cancer Father   . Hypertension Father   . Cancer Father     prostate  . Glaucoma Mother   . Emphysema Mother   . Hypertension Mother   . Other Brother     Brain tumor  . Cancer Brother 2    brain tumor  . Breast cancer Maternal Grandmother   . Prostate cancer Brother     SOCIAL HISTORY: Social History   Social History  . Marital status: Married    Spouse name: N/A  . Number of children: N/A  . Years of education: N/A   Occupational History  . retired Unemployed   Social History Main Topics  . Smoking status: Never Smoker  . Smokeless tobacco: Never Used  . Alcohol use No  . Drug use: No  . Sexual activity: Yes    Partners:  Male   Other Topics Concern  . Not on file   Social History Narrative   Exercise--no    REVIEW OF SYSTEMS: Constitutional: No fevers, chills, or sweats, no generalized fatigue, change in appetite Eyes: No visual changes, double vision, eye pain Ear, nose and throat: No hearing loss, ear pain, nasal congestion, sore throat Cardiovascular: No chest pain, palpitations Respiratory:  No shortness of breath at rest or with exertion, wheezes GastrointestinaI: No nausea, vomiting, diarrhea, abdominal pain, fecal incontinence Genitourinary:  No dysuria, urinary retention or frequency Musculoskeletal:  No neck pain, back pain Integumentary: No rash, pruritus, skin lesions Neurological: as above Psychiatric: No depression, insomnia,+ anxiety Endocrine: No palpitations, fatigue, diaphoresis, mood swings, change in appetite, change in weight, increased thirst  Hematologic/Lymphatic:  No anemia, purpura, petechiae. Allergic/Immunologic: no itchy/runny eyes, nasal congestion, recent allergic reactions, rashes  PHYSICAL EXAM: Vitals:   09/10/16 1534  BP: 114/62  Pulse: 82   General: No acute distress Head:  Normocephalic/atraumatic, no temporal tenderness or ropiness Neck: supple, no paraspinal tenderness, full range of motion Heart:  Regular rate and rhythm Lungs:  Clear to auscultation bilaterally Back: No paraspinal tenderness Skin/Extremities: No rash, no edema Neurological Exam: alert and oriented to person, place, and time. No aphasia or dysarthria. Fund of knowledge is appropriate.  Remote memory intact. 0/3 delayed recall. Attention and concentration are normal.    Able to name objects and repeat phrases. Cranial nerves: Pupils equal, round, reactive to light.  Fundoscopic exam unremarkable, no papilledema. Extraocular movements intact with no nystagmus. Visual fields full. Facial sensation intact. No facial asymmetry. Tongue, uvula, palate midline.  Motor: Bulk and tone normal, muscle  strength 5/5 throughout with no pronator drift.  Sensation to light touch intact.  No extinction to double simultaneous stimulation.  Deep tendon reflexes +1 throughout, toes downgoing.  Finger to nose testing intact.  Gait narrow-based and steady, able to tandem walk adequately.  Romberg negative.  IMPRESSION: This is a pleasant 71 yo RH woman with a history of hypertension, hyperlipidemia, hypothyroidism, who presented for worsening memory. MOCA score in July 2017 was 21/30, indicating mild cognitive impairment. MRI findings of incidental finding of meningioma in the clivus region, she is asymptomatic from this. A follow-up scan will be done in 1 year or earlier if symptoms change. She continues to work part time without significant difficulties, however her family continues to notice some cognitive decline. She is now on Aricept '10mg'$  daily without side effects. Her family continues to report mood changes. She stopped Paxil due to concern it was causing headaches, but even off Paxil headaches continue, suggestive of tension-type headaches. Check ESR and CRP to rule out temporal arteritis, but this is less likely. She is agreeable to start a different headache preventative medication, nortriptyline may help with mood as well. Start low dose '10mg'$  qhs x 2 weeks, then increase to '20mg'$  qhs. Side effects were discussed. We again discussed the importance of control of vascular risk factors, physical exercise and brain stimulation exercises for brain health. She will follow-up in 3 months and knows to call for any changes.   Thank you for allowing me to participate in her care.  Please do not hesitate to call for any questions or concerns.  The duration of this appointment visit was 25 minutes of face-to-face time with the patient.  Greater than 50% of this time was spent in counseling, explanation of diagnosis, planning of further management, and coordination of care.   Michelle [name], M.D.   CC: Dr. Cheri Rous

## 2016-09-10 NOTE — Telephone Encounter (Signed)
I would recommend Td and flu shot if she has not had yet this season. I do not have a record of a flu shot.

## 2016-09-10 NOTE — Patient Instructions (Signed)
1. Start nortriptyline 63m: take 1 capsule at night for 2 weeks, then increase to 2 capsules at night 2. Continue Aricept 114mdaily 3. Bloodwork for ESR, CRP 4. Follow-up in 3 months, call for any changes

## 2016-09-10 NOTE — Telephone Encounter (Signed)
Looks like she just needs a Tetanus as well.  Will this be ok to do with her flu shot she schedules?

## 2016-09-11 LAB — C-REACTIVE PROTEIN: CRP: <0.1 mg/dL — ABNORMAL LOW (ref 0.5–20.0)

## 2016-09-11 LAB — SEDIMENTATION RATE: SED RATE: 4 mm/h (ref 0–30)

## 2016-09-11 NOTE — Telephone Encounter (Signed)
Patient notified

## 2016-09-16 ENCOUNTER — Telehealth: Payer: Self-pay

## 2016-09-16 NOTE — Telephone Encounter (Signed)
Notified patients husband of below. Patient to call back if she has any questions.

## 2016-09-16 NOTE — Telephone Encounter (Signed)
-----   Message from Cameron Sprang, MD sent at 09/15/2016 10:02 AM EST ----- Pls let her know bloodwork is normal, no evidence of inflammation. Thanks

## 2016-09-17 ENCOUNTER — Encounter: Payer: Self-pay | Admitting: Neurology

## 2016-09-17 DIAGNOSIS — G44209 Tension-type headache, unspecified, not intractable: Secondary | ICD-10-CM | POA: Insufficient documentation

## 2016-09-23 DIAGNOSIS — Z961 Presence of intraocular lens: Secondary | ICD-10-CM | POA: Diagnosis not present

## 2016-09-23 DIAGNOSIS — H401132 Primary open-angle glaucoma, bilateral, moderate stage: Secondary | ICD-10-CM | POA: Diagnosis not present

## 2016-09-26 DIAGNOSIS — M4682 Other specified inflammatory spondylopathies, cervical region: Secondary | ICD-10-CM | POA: Diagnosis not present

## 2016-09-26 DIAGNOSIS — M25512 Pain in left shoulder: Secondary | ICD-10-CM | POA: Diagnosis not present

## 2016-10-02 ENCOUNTER — Ambulatory Visit (INDEPENDENT_AMBULATORY_CARE_PROVIDER_SITE_OTHER)
Admission: RE | Admit: 2016-10-02 | Discharge: 2016-10-02 | Disposition: A | Discharge status: Home / Self Care | Payer: Medicare HMO | Source: Ambulatory Visit | Attending: Emergency Medicine | Admitting: Emergency Medicine

## 2016-10-02 ENCOUNTER — Ambulatory Visit (INDEPENDENT_AMBULATORY_CARE_PROVIDER_SITE_OTHER): Payer: Medicare HMO | Admitting: Emergency Medicine

## 2016-10-02 ENCOUNTER — Encounter: Payer: Self-pay | Admitting: Emergency Medicine

## 2016-10-02 VITALS — BP 104/60 | HR 74 | Ht 63.0 in | Wt 121.0 lb

## 2016-10-02 DIAGNOSIS — R053 Chronic cough: Secondary | ICD-10-CM

## 2016-10-02 DIAGNOSIS — R05 Cough: Secondary | ICD-10-CM

## 2016-10-02 DIAGNOSIS — Z23 Encounter for immunization: Secondary | ICD-10-CM | POA: Diagnosis not present

## 2016-10-02 MED ORDER — FLUTICASONE PROPIONATE 50 MCG/ACT NA SUSP: 2.0000 | Freq: Every day | NASAL | 5 refills | Status: DC

## 2016-10-02 MED ORDER — HYDROCODONE-HOMATROPINE 5-1.5 MG/5ML PO SYRP: 5.0000 mL | ORAL_SOLUTION | Freq: Four times a day (QID) | ORAL | 0 refills | Status: DC | PRN

## 2016-10-02 MED ORDER — LORATADINE 10 MG PO TABS: 10.0000 mg | ORAL_TABLET | Freq: Every day | ORAL | 5 refills | Status: DC

## 2016-10-02 MED ORDER — ESOMEPRAZOLE MAGNESIUM 40 MG PO CPDR
40.0000 mg | DELAYED_RELEASE_CAPSULE | Freq: Every day | ORAL | 5 refills | Status: DC
Start: 2016-10-02 — End: 2016-10-14

## 2016-10-02 MED ORDER — BENZONATATE 200 MG PO CAPS: 200.0000 mg | ORAL_CAPSULE | Freq: Three times a day (TID) | ORAL | 1 refills | Status: DC | PRN

## 2016-10-02 NOTE — Patient Instructions (Addendum)
Please start fluticasone nasal spray, 2 sprays each nostril once a day Please start loratadine 10mg  daily Please use OTC chlorpheniramine 4mg  at bedtime.  Please start nexium 40mg  once a day (one our before or one hour after eating) until next visit.  Work on setting up a time to practice complete voice rest for a few days While you are doing your voice rest, you will need to suppress your cough by using hycodan up to every 6 hours if needed and with tessalon perles up to every 8 hours if needed.  CXR today Flu shot today.  Follow with Dr Lamonte Sakai in 1 month. If your cough continues we may decide to evaluate your airways with a bronchoscopy.

## 2016-10-02 NOTE — Progress Notes (Signed)
Subjective:    Patient ID: Michelle [name], female    DOB: Jan 07, 1946, 71 y.o.   MRN: [id number]  HPI 71 year old woman, never smoker, history of allergic rhinitis, GERD, hypertension, irritable bowel syndrome, mild cognitive impairment on aricept. She is referred by gastroenterology for evaluation of chronic cough, worse for the last year. Seems to be worst in the am, clears some mucous every morning, clear to white. Can also happen at night when she lays down, during day as well with change in air temperature, sometimes with eating. Denies any aspiration sx. She has allergies, has to blow nose, has congestion. No overt GERD symptoms.    Review of Systems  Constitutional: Positive for appetite change and unexpected weight change. Negative for fever.  HENT: Negative for congestion, dental problem, ear pain, nosebleeds, postnasal drip, rhinorrhea, sinus pressure, sneezing, sore throat and trouble swallowing.   Eyes: Negative for redness and itching.  Respiratory: Positive for cough. Negative for chest tightness, shortness of breath and wheezing.   Cardiovascular: Negative for palpitations and leg swelling.  Gastrointestinal: Negative for nausea and vomiting.  Genitourinary: Negative for dysuria.  Musculoskeletal: Negative for joint swelling.  Skin: Negative for rash.  Neurological: Positive for headaches.  Hematological: Does not bruise/bleed easily.  Psychiatric/Behavioral: Negative for dysphoric mood. The patient is not nervous/anxious.    Past Medical History:  Diagnosis Date  . Adenomatous colon polyp   . Allergy   . GERD (gastroesophageal reflux disease)   . Glaucoma   . Hx of hearing loss    bilateral hearing aids  . Hyperlipidemia   . Hypertension   . IBS (irritable bowel syndrome)   . Low back pain   . Thyroid disease      Family History  Problem Relation Age of Onset  . Macular degeneration Father   . Prostate cancer Father   . Hypertension Father   . Cancer Father      prostate  . Glaucoma Mother   . Emphysema Mother   . Hypertension Mother   . Other Brother     Brain tumor  . Cancer Brother 76    brain tumor  . Breast cancer Maternal Grandmother   . Prostate cancer Brother      Social History   Social History  . Marital status: Married    Spouse name: N/A  . Number of children: N/A  . Years of education: N/A   Occupational History  . retired Unemployed   Social History Main Topics  . Smoking status: Never Smoker  . Smokeless tobacco: Never Used  . Alcohol use No  . Drug use: No  . Sexual activity: Yes    Partners: Male   Other Topics Concern  . Not on file   Social History Narrative   Exercise--no     No Known Allergies   Outpatient Medications Prior to Visit  Medication Sig Dispense Refill  . ALPHAGAN P 0.1 % SOLN     . aspirin 81 MG tablet Take 160 mg by mouth daily.    . calcium carbonate (OS-CAL) 600 MG TABS Take 600 mg by mouth 2 (two) times daily with a meal.    . donepezil (ARICEPT) 10 MG tablet Take 1/2 tablet daily for 1 month, then increase to 1 tablet daily 30 tablet 6  . levothyroxine (SYNTHROID, LEVOTHROID) 50 MCG tablet TAKE 1 TABLET (50 MCG TOTAL) BY MOUTH DAILY. 30 tablet 5  . Multiple Vitamin (MULTIVITAMIN) tablet Take 1 tablet by mouth daily.    Marland Kitchen  nortriptyline (PAMELOR) 10 MG capsule Take 1 capsule at night for 2 weeks, then increase to 2 capsules at night 60 capsule 6  . PARoxetine (PAXIL) 10 MG tablet Take 1/2 tablet daily for 1 week, then increase to 1 tablet 30 tablet 6  . pravastatin (PRAVACHOL) 40 MG tablet Take 1 tablet (40 mg total) by mouth daily. 90 tablet 1  . Probiotic Product (ALIGN) 4 MG CAPS Take 1 capsule by mouth daily.    . valsartan (DIOVAN) 160 MG tablet Take 1 tablet (160 mg total) by mouth daily. 90 tablet 0   No facility-administered medications prior to visit.         Objective:   Physical Exam Vitals:   10/02/16 1115  BP: 104/60  Pulse: 74  SpO2: 100%  Weight: 121 lb  (54.9 kg)  Height: 5\' 3"  (1.6 m)   Gen: Pleasant, well-nourished, in no distress,  normal affect  ENT: No lesions,  mouth clear,  oropharynx clear, no postnasal drip  Neck: No JVD, no TMG, no carotid bruits  Lungs: No use of accessory muscles, clear without rales or rhonchi  Cardiovascular: RRR, heart sounds normal, no murmur or gallops, no peripheral edema  Musculoskeletal: No deformities, no cyanosis or clubbing  Neuro: alert, non focal  Skin: Warm, no lesions or rashes      Assessment & Plan:  COUGH Based on hx appears to be most associated with chronic rhinitis. She does not have overt GERD sx. Will treat both aggressively, probably be able to peel off the PPI at next visit.   Please start fluticasone nasal spray, 2 sprays each nostril once a day Please start loratadine 10mg  daily Please use OTC chlorpheniramine 4mg  at bedtime.  Please start nexium 40mg  once a day (one our before or one hour after eating) until next visit.  Work on setting up a time to practice complete voice rest for a few days While you are doing your voice rest, you will need to suppress your cough by using hycodan up to every 6 hours if needed and with tessalon perles up to every 8 hours if needed.  CXR today Flu shot today.  Follow with Dr Lamonte Sakai in 1 month. If your cough continues we may decide to evaluate your airways with a bronchoscopy.   Baltazar Apo, MD, PhD 10/02/2016, 11:54 AM [facility] [phone] or if no answer [phone]

## 2016-10-02 NOTE — Assessment & Plan Note (Signed)
Based on hx appears to be most associated with chronic rhinitis. She does not have overt GERD sx. Will treat both aggressively, probably be able to peel off the PPI at next visit.   Please start fluticasone nasal spray, 2 sprays each nostril once a day Please start loratadine 10mg  daily Please use OTC chlorpheniramine 4mg  at bedtime.  Please start nexium 40mg  once a day (one our before or one hour after eating) until next visit.  Work on setting up a time to practice complete voice rest for a few days While you are doing your voice rest, you will need to suppress your cough by using hycodan up to every 6 hours if needed and with tessalon perles up to every 8 hours if needed.  CXR today Flu shot today.  Follow with Dr Lamonte Sakai in 1 month. If your cough continues we may decide to evaluate your airways with a bronchoscopy.

## 2016-10-03 ENCOUNTER — Telehealth: Payer: Self-pay | Admitting: Family Medicine

## 2016-10-03 NOTE — Telephone Encounter (Signed)
ok 

## 2016-10-03 NOTE — Telephone Encounter (Signed)
Pt husband would like to have pt transfer care from Dr. Etter Sjogren to Dr. Birdie Riddle, please advise ok to schedule.

## 2016-10-03 NOTE — Telephone Encounter (Signed)
Disregard

## 2016-10-06 ENCOUNTER — Ambulatory Visit (INDEPENDENT_AMBULATORY_CARE_PROVIDER_SITE_OTHER): Payer: Medicare HMO | Admitting: Family Medicine

## 2016-10-06 ENCOUNTER — Encounter: Payer: Self-pay | Admitting: Family Medicine

## 2016-10-06 VITALS — BP 121/58 | HR 68 | Temp 97.8°F | Ht 63.0 in | Wt 123.2 lb

## 2016-10-06 DIAGNOSIS — R634 Abnormal weight loss: Secondary | ICD-10-CM

## 2016-10-06 DIAGNOSIS — M21372 Foot drop, left foot: Secondary | ICD-10-CM

## 2016-10-06 NOTE — Progress Notes (Signed)
Pre visit review using our clinic review tool, if applicable. No additional management support is needed unless otherwise documented below in the visit note. 

## 2016-10-06 NOTE — Patient Instructions (Addendum)
Call your neurologist regarding your L foot.  Drink protein shakes and protein bars. These have a good amount of nutrition and calories.

## 2016-10-06 NOTE — Progress Notes (Signed)
Chief Complaint  Patient presents with  . Weight Loss    Pt reports losing 24 lbs in less than 6 months/ Pt reports LT foot drop and has noticed x4 weeks     Subjective: Patient is a 71 y.o. female here for Unintentional weight loss over the past 6 months. She is here with her husband and daughter.  Around 6 months ago, the patient started to steadily lose weight. She has decreased appetite and early satiety. She has lost around 24 pounds. No medication changes or life changes at that time. She will sometimes skip lunch, but this is not new to accompany the weight loss. She saw the gastroenterology team at the end of December. Labs were unremarkable and no further imaging was thought to be necessary at that time. She saw a pulmonologist for a chronic cough issue within the past week. A chest x-ray was done that did not show any nodules or infiltrate. She does not have any blood in her stool or dark tarry stools. She denies any pain outside of her normal IBS pain. Other than her chronic cough, she denies any shortness of breath. She has never smoked. She has no pain with urination or blood in her urine. She denies any moles or changing skin lesions. 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 No headaches or vision changes. Of note, she was recently diagnosed with mild cognitive impairment  Her family brought up towards the end of the visit and left foot issue. It appears she is not able to lift her left foot. This is been going on for the past 4-6 weeks. There is no injury or known incident that started this. She has not fallen. She still has normal sensation of her lower extremity.  ROS: GI: Denies bleeding Lungs: Denies SOB, +cough  Family History  Problem Relation Age of Onset  . Macular degeneration Father   . Prostate cancer Father   . Hypertension Father   . Cancer Father     prostate  . Glaucoma Mother   . Emphysema Mother   . Hypertension Mother   . Other Brother    Brain tumor  . Cancer Brother 93    brain tumor  . Breast cancer Maternal Grandmother   . Prostate cancer Brother    Past Medical History:  Diagnosis Date  . Adenomatous colon polyp   . Allergy   . GERD (gastroesophageal reflux disease)   . Glaucoma   . Hx of hearing loss    bilateral hearing aids  . Hyperlipidemia   . Hypertension   . IBS (irritable bowel syndrome)   . Low back pain   . Thyroid disease    No Known Allergies  Current Outpatient Prescriptions:  .  ALPHAGAN P 0.1 % SOLN, , Disp: , Rfl:  .  aspirin 81 MG tablet, Take 160 mg by mouth daily., Disp: , Rfl:  .  benzonatate (TESSALON) 200 MG capsule, Take 1 capsule (200 mg total) by mouth 3 (three) times daily as needed for cough., Disp: 90 capsule, Rfl: 1 .  calcium carbonate (OS-CAL) 600 MG TABS, Take 600 mg by mouth 2 (two) times daily with a meal., Disp: , Rfl:  .  donepezil (ARICEPT) 10 MG tablet, Take 1/2 tablet daily for 1 month, then increase to 1 tablet daily, Disp: 30 tablet, Rfl: 6 .  esomeprazole (NEXIUM) 40 MG capsule, Take 1 capsule (40 mg total) by mouth daily at 12 noon., Disp: 30 capsule, Rfl: 5 .  fluticasone (FLONASE) 50 MCG/ACT nasal spray, Place 2 sprays into both nostrils daily., Disp: 16 g, Rfl: 5 .  HYDROcodone-homatropine (HYCODAN) 5-1.5 MG/5ML syrup, Take 5 mLs by mouth every 6 (six) hours as needed for cough., Disp: 240 mL, Rfl: 0 .  levocetirizine (XYZAL) 5 MG tablet, Take 1 tablet by mouth daily., Disp: , Rfl: 5 .  levothyroxine (SYNTHROID, LEVOTHROID) 50 MCG tablet, TAKE 1 TABLET (50 MCG TOTAL) BY MOUTH DAILY., Disp: 30 tablet, Rfl: 5 .  loratadine (CLARITIN) 10 MG tablet, Take 1 tablet (10 mg total) by mouth daily., Disp: 30 tablet, Rfl: 5 .  Multiple Vitamin (MULTIVITAMIN) tablet, Take 1 tablet by mouth daily., Disp: , Rfl:  .  nortriptyline (PAMELOR) 10 MG capsule, Take 1 capsule at night for 2 weeks, then increase to 2 capsules at night, Disp: 60 capsule, Rfl: 6 .  pravastatin (PRAVACHOL)  40 MG tablet, Take 1 tablet (40 mg total) by mouth daily., Disp: 90 tablet, Rfl: 1 .  Probiotic Product (ALIGN) 4 MG CAPS, Take 1 capsule by mouth daily., Disp: , Rfl:  .  valsartan (DIOVAN) 160 MG tablet, Take 1 tablet (160 mg total) by mouth daily., Disp: 90 tablet, Rfl: 0 .  PARoxetine (PAXIL) 10 MG tablet, Take 1/2 tablet daily for 1 week, then increase to 1 tablet (Patient not taking: Reported on 10/06/2016), Disp: 30 tablet, Rfl: 6  Objective: BP (!) 121/58 (BP Location: Left Arm, Patient Position: Sitting, Cuff Size: Small)   Pulse 68   Temp 97.8 F (36.6 C) (Oral)   Ht 5\' 3"  (1.6 m)   Wt 123 lb 3.2 oz (55.9 kg)   SpO2 97%   BMI 21.82 kg/m  General: Awake, appears stated age HEENT: MMM, EOMi Heart: RRR, no murmurs, no LE edema Lungs: CTAB, no rales, wheezes or rhonchi. No accessory muscle use Abd: BS+, soft, NT, ND, no masses or organomegaly Neuro: Sensation intact to light touch. MSK: A noticeable foot drop with toe to heel gait on the left while there is a normal heel to toe gait on the right. She is unable to dorsiflex or defer to her left foot, 5/5 strength plantarflexion. 5/5 strength at the ankle on the right. Psych: Age appropriate judgment and insight, normal affect and mood  Assessment and Plan: Foot drop, left foot  Weight loss, unintentional - Plan: Comprehensive metabolic panel, TSH, CBC, T4, free, Urinalysis  Orders as above. I'm very reassured to see that she was evaluated by gastroenterology and pulmonology. Chest x-ray reviewed and shows no nodules. She has a follow-up colonoscopy within the next year. The gastroenterology physician assistant did not believe this needed to be sped up. She does not complain of any bleeding or blood in her stool. I would like her to consume protein shakes and protein bars for calorie dense and nutritious options. She does not show any physical signs of malnourishment. Decreased appetite could be related to mild cognitive impairment  in addition to aging? Would like to rule out other things as well.  Patient is established with a neurologist, I encouraged her to follow-up with the provider. Will refer to a new neurologist if that providers not willing to address this issue. I believe she needs an EMG. I told her to be very mindful of steps, curbs, and folds in the rug. The patient and the rest of her family voiced understanding and agreement to the plan.  [place], DO 10/06/16  5:01 PM

## 2016-10-07 LAB — COMPREHENSIVE METABOLIC PANEL
ALBUMIN: 4.3 g/dL (ref 3.5–5.2)
ALK PHOS: 54 U/L (ref 39–117)
ALT: 17 U/L (ref 0–35)
AST: 23 U/L (ref 0–37)
BILIRUBIN TOTAL: 0.4 mg/dL (ref 0.2–1.2)
BUN: 13 mg/dL (ref 6–23)
CALCIUM: 9.5 mg/dL (ref 8.4–10.5)
CO2: 33 mEq/L — ABNORMAL HIGH (ref 19–32)
CREATININE: 0.75 mg/dL (ref 0.40–1.20)
Chloride: 106 mEq/L (ref 96–112)
GFR: 81.05 mL/min (ref >60.00)
Glucose, Bld: 100 mg/dL — ABNORMAL HIGH (ref 70–99)
Potassium: 5.1 mEq/L (ref 3.5–5.1)
Sodium: 142 mEq/L (ref 135–145)
Total Protein: 7 g/dL (ref 6.0–8.3)

## 2016-10-07 LAB — T4, FREE: FREE T4: 0.8 ng/dL (ref 0.60–1.60)

## 2016-10-07 LAB — URINALYSIS
BILIRUBIN URINE: NEGATIVE
HGB URINE DIPSTICK: NEGATIVE
KETONES UR: NEGATIVE
LEUKOCYTES UA: NEGATIVE
Nitrite: NEGATIVE
PH: 7 (ref 5.0–8.0)
Specific Gravity, Urine: 1.01 (ref 1.000–1.030)
Total Protein, Urine: NEGATIVE
UROBILINOGEN UA: 0.2 (ref 0.0–1.0)
Urine Glucose: NEGATIVE

## 2016-10-07 LAB — TSH: TSH: 2.11 u[IU]/mL (ref 0.35–4.50)

## 2016-10-07 LAB — CBC
HCT: 37.2 % (ref 36.0–46.0)
Hemoglobin: 12.6 g/dL (ref 12.0–15.0)
MCHC: 33.7 g/dL (ref 30.0–36.0)
MCV: 86.7 fl (ref 78.0–100.0)
Platelets: 285 10*3/uL (ref 150.0–400.0)
RBC: 4.29 Mil/uL (ref 3.87–5.11)
RDW: 13.2 % (ref 11.5–15.5)
WBC: 9.7 10*3/uL (ref 4.0–10.5)

## 2016-10-07 NOTE — Telephone Encounter (Signed)
Explained to husband Dr. Virgil Benedict concerns and not able to accept at this time, Husband states an understanding.

## 2016-10-07 NOTE — Telephone Encounter (Signed)
Unfortunately I am not able to accept at this time (my next new pt appt is months away and that is not good care for her)

## 2016-10-09 ENCOUNTER — Other Ambulatory Visit: Payer: Self-pay | Admitting: Family Medicine

## 2016-10-10 ENCOUNTER — Encounter: Payer: Self-pay | Admitting: Neurology

## 2016-10-10 ENCOUNTER — Ambulatory Visit (INDEPENDENT_AMBULATORY_CARE_PROVIDER_SITE_OTHER): Payer: Medicare HMO | Admitting: Neurology

## 2016-10-10 VITALS — BP 126/68 | HR 76 | Ht 63.0 in | Wt 122.0 lb

## 2016-10-10 DIAGNOSIS — M21372 Foot drop, left foot: Secondary | ICD-10-CM

## 2016-10-10 DIAGNOSIS — G3184 Mild cognitive impairment, so stated: Secondary | ICD-10-CM | POA: Diagnosis not present

## 2016-10-10 NOTE — Progress Notes (Signed)
NEUROLOGY FOLLOW UP OFFICE NOTE  Michelle [name] [id number]  HISTORY OF PRESENT ILLNESS: I had the pleasure of seeing Michelle [name] in follow-up in the neurology clinic on 10/10/2016. The patient was last seen a months ago for worsening memory and headaches. She is again accompanied by her husband and  2daughters who help supplement the history today. She presents today for a new symptom of left food drop that started around 09/19/16 or so. She and her family noticed she was not lifting her left foot up. She had occasional numbness in her foot and leg at times. Right side unaffected. She denies any back pain, bowel/bladder dysfunction. She has occasional left-sided neck pain. No recent falls. Her family has been very concerned about the weight loss, as well as confusion. She gets full pretty quickly after eating. Family has bought her Ensure and Boost, but continue to be concerned about weight loss. She seems to hear part of a sentence then answers something else with a different word association. She continues on Aricept and nortriptyline. She continues to work part time without difficulties.   HPI 04/02/16: This is a pleasant 71 yo RH woman with a history of hypertension, hyperlipidemia, hypothyroidism, who presented for evaluation of memory loss. Her daughter started noticing memory changes a year ago. The patient states she can tell a difference in her memory, but that her family notices it more and "they may think I'm in denial." Her family has noticed worsening over the past 6 months, she repeats herself several times, sometimes having the same conversation four times within an hour. Her husband reports that she asked him repeatedly last 44th of July weekend what he was wearing to go home. She misplaces things frequently in the house. She left the stove on one time boiling water. She takes longer to get ready to go out. She denies getting lost driving, no missed bills or medications. 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 Her family also expressed concern about personality changes, she used to be laidback but now her patience is not there. She is cussing a lot more easily now. She doesn't want to do things she used to like. She does not like going out, and stayed in the room while at the beach more than with her family. No paranoia. No difficulties with ADLs. There is no family history of dementia, she denies any history of head injuries, alcohol intake. She has been having dull right frontal headaches for the past 6 months attributed to her sinuses, no associated nausea/vomiting. She denies any dizziness, diplopia, dysarthria/dysphagia, focal numbness/tingling/weakness, bowel/bladder dysfunction. She has occasional neck and left shoulder pain. No falls.   She had an MMSE done with her PCP this month, MMSE 71/30. TSH and B12 unremarkable. She has been unable to do the MRI brain.  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 Post-contrast MRI was ordered, which again showed the 51mm enhancing dural-based mass posterior to the clivus on the right. There is mass effect on the anterior pons on the right which is mildly compressed. The mass is touching the basilar causing mild displacement to the left. There is no definite invasion of the sella. It may be touching the cisternal segment of the trigeminal nerve.   PAST MEDICAL HISTORY:  Past Medical History:  Diagnosis Date  . Adenomatous colon polyp   . Allergy   . GERD (gastroesophageal reflux disease)   . Glaucoma   . Hx of hearing loss    bilateral hearing aids  . Hyperlipidemia   . Hypertension   . IBS (irritable bowel syndrome)   . Low back pain   . Thyroid disease     MEDICATIONS: Current Outpatient Prescriptions on File Prior to Visit    Medication Sig Dispense Refill  . ALPHAGAN P 0.1 % SOLN     . aspirin 81 MG tablet Take 160 mg by mouth daily.    . benzonatate (TESSALON) 200 MG capsule Take 1 capsule (200 mg total) by mouth 3 (three) times daily as needed for cough. 90 capsule 1  . donepezil (ARICEPT) 10 MG tablet Take 1/2 tablet daily for 1 month, then increase to 1 tablet daily 30 tablet 6  . esomeprazole (NEXIUM) 40 MG capsule Take 1 capsule (40 mg total) by mouth daily at 12 noon. 30 capsule 5  . fluticasone (FLONASE) 50 MCG/ACT nasal spray Place 2 sprays into both nostrils daily. 16 g 5  . HYDROcodone-homatropine (HYCODAN) 5-1.5 MG/5ML syrup Take 5 mLs by mouth every 6 (six) hours as needed for cough. 240 mL 0  . levocetirizine (XYZAL) 5 MG tablet Take 1 tablet by mouth daily.  5  . levothyroxine (SYNTHROID, LEVOTHROID) 50 MCG tablet TAKE 1 TABLET (50 MCG TOTAL) BY MOUTH DAILY. 30 tablet 5  . loratadine (CLARITIN) 10 MG tablet Take 1 tablet (10 mg total) by mouth daily. 30 tablet 5  . Multiple Vitamin (MULTIVITAMIN) tablet Take 1 tablet by mouth daily.    . nortriptyline (PAMELOR) 10 MG capsule Take 1 capsule at night for 2 weeks, then increase to 2 capsules at night 60 capsule 6  . pravastatin (PRAVACHOL) 40 MG tablet Take 1 tablet (40 mg total) by mouth daily. 90 tablet 1  . Probiotic Product (ALIGN) 4 MG CAPS Take 1 capsule by mouth daily.    . valsartan (DIOVAN) 160 MG tablet TAKE 1 TABLET (160 MG TOTAL) BY MOUTH DAILY. 90 tablet 0  . calcium carbonate (OS-CAL) 600 MG TABS Take 600 mg by mouth 2 (two) times daily with a meal.    . PARoxetine (PAXIL) 10 MG tablet Take 1/2 tablet daily for 1 week, then increase to 1 tablet (Patient not taking: Reported on 10/06/2016) 30 tablet 6   No current facility-administered medications on file prior to visit.     ALLERGIES: No Known Allergies  FAMILY HISTORY: Family History  Problem Relation Age of Onset  . Macular degeneration Father   . Prostate cancer Father   .  Hypertension Father   . Cancer Father     prostate  . Glaucoma Mother   . Emphysema Mother   . Hypertension Mother   . Other Brother     Brain tumor  . Cancer Brother 63    brain tumor  . Breast cancer Maternal Grandmother   . Prostate cancer Brother     SOCIAL HISTORY: Social History   Social History  . Marital status: Married    Spouse name: N/A  . Number of children: N/A  . Years of education: N/A   Occupational History  . retired Unemployed   Social History Main Topics  . Smoking status: Never Smoker  . Smokeless tobacco: Never Used  . Alcohol use No  . Drug use: No  . Sexual activity: Yes  Partners: Male   Other Topics Concern  . Not on file   Social History Narrative   Exercise--no    REVIEW OF SYSTEMS: Constitutional: No fevers, chills, or sweats, no generalized fatigue, change in appetite Eyes: No visual changes, double vision, eye pain Ear, nose and throat: No hearing loss, ear pain, nasal congestion, sore throat Cardiovascular: No chest pain, palpitations Respiratory:  No shortness of breath at rest or with exertion, wheezes GastrointestinaI: No nausea, vomiting, diarrhea, abdominal pain, fecal incontinence Genitourinary:  No dysuria, urinary retention or frequency Musculoskeletal:  No neck pain, back pain Integumentary: No rash, pruritus, skin lesions Neurological: as above Psychiatric: No depression, insomnia,+ anxiety Endocrine: No palpitations, fatigue, diaphoresis, mood swings, change in appetite, change in weight, increased thirst Hematologic/Lymphatic:  No anemia, purpura, petechiae. Allergic/Immunologic: no itchy/runny eyes, nasal congestion, recent allergic reactions, rashes  PHYSICAL EXAM: Vitals:   10/10/16 1410  BP: 126/68  Pulse: 76   General: No acute distress Head:  Normocephalic/atraumatic, no temporal tenderness or ropiness Neck: supple, no paraspinal tenderness, full range of motion Heart:  Regular rate and rhythm Lungs:   Clear to auscultation bilaterally Back: No paraspinal tenderness Skin/Extremities: No rash, no edema Neurological Exam: alert and oriented to person, place, and time. No aphasia or dysarthria. Fund of knowledge is appropriate.  Remote memory intact.  Attention and concentration are normal.    Able to name objects and repeat phrases. Cranial nerves: Pupils equal, round, reactive to light.  Extraocular movements intact with no nystagmus. Visual fields full. Facial sensation intact. No facial asymmetry. Tongue, uvula, palate midline.  Motor: Bulk and tone normal, muscle strength: 2/5 left foot dorsiflexion and eversion, otherwise 5/5 throughout with no pronator drift.  Sensation decreased to pin on the dorsum of left foot and left outer calf, with tingling reported with stimulation. Deep tendon reflexes +1 throughout, toes downgoing.  Finger to nose testing intact.  Gait narrow-based and steady, able to tandem walk adequately.  Romberg negative.  IMPRESSION: This is a pleasant 71 yo RH woman with a history of hypertension, hyperlipidemia, hypothyroidism, who presented for worsening memory. MOCA score in July 2017 was 21/30, indicating mild cognitive impairment. MRI findings of incidental finding of meningioma in the clivus region, she is asymptomatic from this. Family continues to worry about worsening confusion. Neuropsychological evaluation with Dr. Si Raider will be scheduled to further evaluate cognitive complaints. She has new onset left foot drop, likely due to frequent leg crossing. EMG/NCV of the left LE will be ordered. She will be referred to PT and will benefit from an AFO. Continue current medications. She was advised to stop crossing legs. They will continue working with PCP on the significant weight loss. She will follow-up as scheduled in April and knows to call for any changes.   Thank you for allowing me to participate in her care.  Please do not hesitate to call for any questions or  concerns.  The duration of this appointment visit was 25 minutes of face-to-face time with the patient.  Greater than 50% of this time was spent in counseling, explanation of diagnosis, planning of further management, and coordination of care.   Ellouise Newer, M.D.   CC: Dr. Cheri Rous

## 2016-10-10 NOTE — Patient Instructions (Addendum)
1. Schedule EMG/NCV of the left leg 2. Refer to Physical Therapy 3. Schedule Neurocognitive testing with Dr. Si Raider 4. Continue all your medications 5. Follow-up as scheduled in April  You have been referred for a neurocognitive evaluation in our office.   The evaluation consists of three appointments.   1. The first appointment is about 45 minutes and is a clinical interview with the neuropsychologist (Dr. Macarthur Critchley). Please bring someone with you to this appointment if possible, as it is helpful for Dr. Si Raider to hear from both you and another adult who knows you well.   2. The second appointment is 2-3 hours long and is with the psychometrician Milana Kidney). You will complete a variety of tasks- mostly question-and-answer, some paper-and-pencil. There is nothing you need to do to prepare for this appointment, but having a good night's sleep prior to the testing, and bringing eyeglasses and hearing aids (if you wear them), is advised.   3. The final appointment is a follow-up with Dr. Si Raider where she will go over the test results with you and provide recommendations and a plan of care. This appointment is about 30 minutes.  If you would like a family member to receive this information as well, please bring them to the appointment.   We have to reserve several hours of the neuropsychologist's time and the psychometrician's time for your appointment. As such, please note that there is a No-Show fee of $100. If you are unable to attend any of your appointments, please contact our office as soon as possible to reschedule.

## 2016-10-14 ENCOUNTER — Telehealth: Payer: Self-pay | Admitting: Neurology

## 2016-10-14 ENCOUNTER — Ambulatory Visit (INDEPENDENT_AMBULATORY_CARE_PROVIDER_SITE_OTHER): Payer: Medicare HMO | Admitting: Family Medicine

## 2016-10-14 ENCOUNTER — Encounter: Payer: Self-pay | Admitting: Family Medicine

## 2016-10-14 ENCOUNTER — Ambulatory Visit (INDEPENDENT_AMBULATORY_CARE_PROVIDER_SITE_OTHER): Payer: Medicare HMO | Admitting: Neurology

## 2016-10-14 ENCOUNTER — Encounter: Payer: Self-pay | Admitting: Neurology

## 2016-10-14 ENCOUNTER — Encounter: Payer: Medicare HMO | Admitting: Neurology

## 2016-10-14 VITALS — BP 112/60 | HR 87 | Temp 97.0°F | Ht 63.0 in | Wt 124.0 lb

## 2016-10-14 DIAGNOSIS — M21372 Foot drop, left foot: Secondary | ICD-10-CM

## 2016-10-14 DIAGNOSIS — I10 Essential (primary) hypertension: Secondary | ICD-10-CM | POA: Diagnosis not present

## 2016-10-14 DIAGNOSIS — G44209 Tension-type headache, unspecified, not intractable: Secondary | ICD-10-CM

## 2016-10-14 DIAGNOSIS — E785 Hyperlipidemia, unspecified: Secondary | ICD-10-CM

## 2016-10-14 DIAGNOSIS — R634 Abnormal weight loss: Secondary | ICD-10-CM

## 2016-10-14 DIAGNOSIS — G3184 Mild cognitive impairment, so stated: Secondary | ICD-10-CM

## 2016-10-14 DIAGNOSIS — K219 Gastro-esophageal reflux disease without esophagitis: Secondary | ICD-10-CM | POA: Diagnosis not present

## 2016-10-14 DIAGNOSIS — G5732 Lesion of lateral popliteal nerve, left lower limb: Secondary | ICD-10-CM

## 2016-10-14 MED ORDER — DONEPEZIL HCL 10 MG PO TABS: ORAL_TABLET | ORAL | 6 refills | Status: DC

## 2016-10-14 MED ORDER — NORTRIPTYLINE HCL 10 MG PO CAPS: ORAL_CAPSULE | ORAL | 6 refills | Status: DC

## 2016-10-14 MED ORDER — PRAVASTATIN SODIUM 40 MG PO TABS: 40.0000 mg | ORAL_TABLET | Freq: Every day | ORAL | 1 refills | Status: DC

## 2016-10-14 MED ORDER — ESOMEPRAZOLE MAGNESIUM 40 MG PO CPDR: 40.0000 mg | DELAYED_RELEASE_CAPSULE | Freq: Every day | ORAL | 5 refills | Status: DC

## 2016-10-14 MED ORDER — FLUTICASONE PROPIONATE 50 MCG/ACT NA SUSP: 2.0000 | Freq: Every day | NASAL | 5 refills | Status: DC

## 2016-10-14 MED ORDER — VALSARTAN 160 MG PO TABS: 160.0000 mg | ORAL_TABLET | Freq: Every day | ORAL | 0 refills | Status: DC

## 2016-10-14 NOTE — Progress Notes (Signed)
Pre visit review using our clinic review tool, if applicable. No additional management support is needed unless otherwise documented below in the visit note. 

## 2016-10-14 NOTE — Assessment & Plan Note (Signed)
con't boost--  Tid between meals  Pt needs to eat healthier meals F/u GI with symptoms--- d/w with daughters and husband

## 2016-10-14 NOTE — Telephone Encounter (Signed)
FYI

## 2016-10-14 NOTE — Assessment & Plan Note (Signed)
Per neuro Changes made to contact info so the daughters will be called and DPR to be changed Pt should not be called with test results etc because of her memory issues

## 2016-10-14 NOTE — Procedures (Signed)
[facility]  [address], [address]  [place], [place] [address] Tel: [phone] Fax:  [phone] Test Date:  10/14/2016  Patient: Michelle [name] DOB: 06/13/1946 Physician: Narda Amber, DO  Sex: Female Height: 5\' 2"  Ref Phys: Ellouise Newer, M.D.  ID#: [id number] Temp: 34.3C Technician: Jerilynn Mages. Dean   Patient Complaints: This is a 71 year old female referred for evaluation of left footdrop and paresthesias.   NCV & EMG Findings: Extensive electrodiagnostic testing of the left lower extremity shows:  1. Left sural and superficial peroneal sensory responses are within normal limits. 2. Left peroneal motor response shows reduced amplitude at the extensor digitorum brevis and slowed conduction velocity at the fibular head.  Left peroneal motor response recording at the tibialis anterior shows conduction block at the fibular head. Left tibial motor responses within normal limits. 3. Left tibial H reflex studies within normal limits. 4. Rapid recruitment pattern and active motor axon loss changes are seen affecting the left tibialis anterior, fibularis longus, and extensor hallucis longus muscles.  The biceps femoris short head and gluteus medius muscles showed normal motor unit recruitment and configuration.  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    ___________________________ Narda Amber, DO    Nerve Conduction Studies Anti Sensory Summary Table   Stim Site NR Peak (ms) Norm Peak (ms) P-T Amp (V) Norm P-T Amp  Left Sup Peroneal Anti Sensory (Ant Lat Mall)  34.3C  12 cm    2.7 <4.6 5.3 >3  Left Sural Anti Sensory (Lat Mall)  34.3C  Calf    2.3 <4.6 5.4 >3   Motor Summary Table   Stim Site NR Onset (ms) Norm Onset (ms) O-P Amp (mV) Norm O-P Amp Site1 Site2 Delta-0 (ms) Dist (cm) Vel (m/s) Norm Vel (m/s)  Left  Peroneal Motor (Ext Dig Brev)  34.3C  Ankle    3.4 <6.0 2.2 >2.5 B Fib Ankle 7.1 30.0 42 >40  B Fib    10.5  1.8  Poplt B Fib 3.2 10.0 31 >40  Poplt    13.7  1.5         Right Peroneal Motor (Ext Dig Brev)  34.3C  Ankle    3.8 <6.0 3.2 >2.5 B Fib Ankle 6.7 30.0 45 >40  B Fib    10.5  2.5  Poplt B Fib 1.6 10.0 63 >40  Poplt    12.1  2.5         Left Peroneal TA Motor (Tib Ant)  34.3C  Fib Head    1.8 <4.5 3.8 >3 Poplit Fib Head 2.3 10.0 43 >40  Poplit    4.1  1.8         Left Tibial Motor (Abd Hall Brev)  34.3C  Ankle    3.9 <6.0 14.3 >4 Knee Ankle 8.3 36.0 43 >40  Knee    12.2  8.6          H Reflex Studies   NR H-Lat (ms) Lat Norm (ms) L-R H-Lat (ms) M-Lat (ms) HLat-MLat (ms)  Left Tibial (Gastroc)  34.3C     33.47 <35 1.36 3.27 30.20   EMG   Side Muscle Ins Act Fibs Psw Fasc Number Recrt Dur Dur. Amp Amp. Poly Poly. Comment  Left AntTibialis Nml 2+ Nml Nml SMU Rapid Nml Nml Nml Nml Nml Nml N/A  Left Gastroc Nml Nml Nml Nml Nml Nml Nml  Nml Nml Nml Nml Nml N/A  Left Fibularis Long Nml 1+ Nml Nml [facility]U Rapid Nml Nml Nml Nml Nml Nml N/A  Left ExtHallLong Nml 2+ Nml Nml SMU Rapid Nml Nml Nml Nml Few 1+ N/A  Left BicepsFemS Nml Nml Nml Nml Nml Nml Nml Nml Nml Nml Nml Nml N/A  Left GluteusMed Nml Nml Nml Nml Nml Nml Nml Nml Nml Nml Nml Nml N/A      Waveforms:

## 2016-10-14 NOTE — Assessment & Plan Note (Signed)
Per neuro 

## 2016-10-14 NOTE — Patient Instructions (Signed)
Healthy Eating to Prevent Digestive Disorders The digestive system starts at the mouth and goes all the way down to the rectum. Along the way, your digestive system breaks down the food you eat so you can absorb its nutrients and use them for energy. Digestive disorders can cause gas, bloating, pain, heartburn, and other symptoms. They can prevent your digestive system from doing its job. Healthy eating and a healthy lifestyle can help you avoid many common digestive disorders. What nutrition changes can be made? Start by eating a balanced diet. Eat healthy foods from all the major food groups. These include carbohydrates, fats, and proteins. Other changes you can make include to:  Eat enough fiber. Fiber is a healthy carbohydrate that cleans out your digestive system. Fiber absorbs water and helps you have regular bowel movements. Fiber comes from plants. To get enough fiber in your diet, eat 4-5 servings of fruits, vegetables, and legumes every day. Include beans and whole grains. Most people should get 20?35 grams of fiber each day.  Drink enough water to keep your urine clear or pale yellow. Water helps your body digest food. It can also help prevent constipation.  Avoid fatty proteins. Full-fat dairy products and fatty meats are hard to digest. Fats you want to avoid are those that get solid at room temperature (saturated fats). Instead of eating these kinds of fats, eat plant-based unsaturated fats found in olives, canola, corn, avocado, and nuts.  If you have trouble with gas, belching, or flatulence, avoid gas-producing foods. These include beans, carbonated beverages, cabbage, cauliflower, and broccoli. If you are lactose intolerant, avoid dairy products or choose lactose-free dairy products.  If you have frequent heartburn, stay away from alcohol, caffeine, fatty foods, chocolate, and peppermint. Avoid lying down within two hours of eating a full meal. Overeating and lying down too soon after  a meal can cause heartburn.  Add probiotics to your diet. Healthy digestion depends on having the right balance of good bacteria in your colon. Probiotics can help restore the balance of good bacteria in your digestive system. Probiotics are live active cultures that are found in yogurt, kefir, and cultured foods like sauerkraut and miso. You can also add good bacteria with probiotic supplements.  Make sure to chew your food slowly and completely.  Instead of eating three large meals each day, eat three small meals with three small snacks. What other changes can I make? You can help your digestive system stay healthy by making these lifestyle changes:  Stay active and exercise every day.  Maintain a healthy weight.  Eat on a regular schedule.  Avoid tight-fitting clothes. They can restrict digestion.  If you have frequent heartburn, raise the head of your bed 2-3 inches (5-7.5 cm).  Do not use any tobacco products, such as cigarettes, chewing tobacco, and e-cigarettes. If you need help quitting, ask your health care provider.  Limit alcohol intake to no more than 1 drink a day for nonpregnant women and 2 drinks a day for men. One drink equals 12 oz of beer, 5 oz of wine, or 1 oz of hard liquor.  Avoid stress. Find ways to reduce stress, such as meditation, exercise, or taking time for activities that relax you. Why should I make these changes? Making these changes will help your digestive system function at its best. A healthy digestive system can help you avoid or improve your management of digestive disorders such as:  Bloating, gas, and flatulence.  Heartburn.  Gastroesophageal reflux disease (GERD).  Peptic ulcer disease.  Hemorrhoids.  Diverticulitis.  Constipation.  Diarrhea.  Gall stones  Irritable bowel syndrome.  Malnutrition.  Fatty liver disease. What can happen if changes are not made? Not making these changes could put you at risk for many conditions  caused by a poor diet or an unhealthy weight, such as heart disease, stroke and diabetes. Where can I get more information? Learn more about healthy eating and digestive disorders by visiting these websites:  Academy of Nutrition and Dietetics: DenimDistribution.com.ee  Centers for Disease Control and Prevention: CoinSpecialists.co.za  U.S. Department of Health and Human Services: [place]CarWash.com.cy.pdf Summary  A heathy diet can help prevent many digestive disorders.  Eat a balanced diet consisting of fiber, unsaturated fats, lean protein, fruits, and vegetables.  Eat three small meals with three small snacks per day.  Drink plenty of water every day.  Get plenty of exercise and maintain a healthy weight. This information is not intended to replace advice given to you by your health care provider. Make sure you discuss any questions you have with your health care provider. Document Released: 09/21/2015 Document Revised: 01/31/2016 Document Reviewed: 05/07/2016 Elsevier Interactive Patient Education  2017 [employer].

## 2016-10-14 NOTE — Telephone Encounter (Signed)
Alysan Herrell April 17, 2046. She was in the office today for an EMG study. Her daughter Gardiner Barefoot [phone] would like you to please call her with EMG results not patient. Thank you

## 2016-10-14 NOTE — Progress Notes (Signed)
Patient ID: Michelle [name], female   DOB: Sep 06, 1946, 71 y.o.   MRN: [id number]   Subjective:    Patient ID: Michelle [name], female    DOB: 05-21-1946, 71 y.o.   MRN: [id number]  Chief Complaint  Patient presents with  . Weight Loss    Has lost 20 pounds since October/November.     HPI  Patient is in today for an acute visit. Patient states that over the past year has lost 20-26 pounds of which 20 pounds has been since October/November 2017. No additional concerns noted at this time.  I acted as a Education administrator for [employer], DO. Raiford Noble, [place]  Past Medical History:  Diagnosis Date  . Adenomatous colon polyp   . Allergy   . GERD (gastroesophageal reflux disease)   . Glaucoma   . Hx of hearing loss    bilateral hearing aids  . Hyperlipidemia   . Hypertension   . IBS (irritable bowel syndrome)   . Low back pain   . Thyroid disease     Past Surgical History:  Procedure Laterality Date  . ABDOMINAL HYSTERECTOMY  1998  . CATARACT EXTRACTION, BILATERAL Bilateral 04/2015  . CHOLECYSTECTOMY  1998  . [place] / 2013   skin graft on gums  . TUBAL LIGATION  1977    Family History  Problem Relation Age of Onset  . Macular degeneration Father   . Prostate cancer Father   . Hypertension Father   . Cancer Father     prostate  . Glaucoma Mother   . Emphysema Mother   . Hypertension Mother   . Other Brother     Brain tumor  . Cancer Brother 19    brain tumor  . Breast cancer Maternal Grandmother   . Prostate cancer Brother     Social History   Social History  . Marital status: Married    Spouse name: N/A  . Number of children: N/A  . Years of education: N/A   Occupational History  . retired Unemployed   Social History Main Topics  . Smoking status: Never Smoker  . Smokeless tobacco: Never Used  . Alcohol use No  . Drug use: No  . Sexual activity: Yes    Partners: Male   Other Topics Concern  . Not on file   Social History Narrative   Exercise--no    Outpatient Medications Prior to Visit  Medication Sig Dispense Refill  . ALPHAGAN P 0.1 % SOLN     . aspirin 81 MG tablet Take 160 mg by mouth daily.    . benzonatate (TESSALON) 200 MG capsule Take 1 capsule (200 mg total) by mouth 3 (three) times daily as needed for cough. 90 capsule 1  . calcium carbonate (OS-CAL) 600 MG TABS Take 600 mg by mouth 2 (two) times daily with a meal.    . HYDROcodone-homatropine (HYCODAN) 5-1.5 MG/5ML syrup Take 5 mLs by mouth every 6 (six) hours as needed for cough. 240 mL 0  . levocetirizine (XYZAL) 5 MG tablet Take 1 tablet by mouth daily.  5  . levothyroxine (SYNTHROID, LEVOTHROID) 50 MCG tablet TAKE 1 TABLET (50 MCG TOTAL) BY MOUTH DAILY. 30 tablet 5  . loratadine (CLARITIN) 10 MG tablet Take 1 tablet (10 mg total) by mouth daily. 30 tablet 5  . Multiple Vitamin (MULTIVITAMIN) tablet Take 1 tablet by mouth daily.    Marland Kitchen PARoxetine (PAXIL) 10 MG tablet Take 1/2 tablet daily for 1 week, then increase  to 1 tablet (Patient not taking: Reported on 10/06/2016) 30 tablet 6  . Probiotic Product (ALIGN) 4 MG CAPS Take 1 capsule by mouth daily.    Marland Kitchen donepezil (ARICEPT) 10 MG tablet Take 1/2 tablet daily for 1 month, then increase to 1 tablet daily 30 tablet 6  . esomeprazole (NEXIUM) 40 MG capsule Take 1 capsule (40 mg total) by mouth daily at 12 noon. 30 capsule 5  . fluticasone (FLONASE) 50 MCG/ACT nasal spray Place 2 sprays into both nostrils daily. 16 g 5  . nortriptyline (PAMELOR) 10 MG capsule Take 1 capsule at night for 2 weeks, then increase to 2 capsules at night 60 capsule 6  . pravastatin (PRAVACHOL) 40 MG tablet Take 1 tablet (40 mg total) by mouth daily. 90 tablet 1  . valsartan (DIOVAN) 160 MG tablet TAKE 1 TABLET (160 MG TOTAL) BY MOUTH DAILY. 90 tablet 0   No facility-administered medications prior to visit.     No Known Allergies  Review of Systems  Constitutional: Negative for fever and malaise/fatigue.  HENT: Negative for  congestion.   Eyes: Negative for blurred vision.  Respiratory: Negative for cough and shortness of breath.   Cardiovascular: Negative for chest pain, palpitations and leg swelling.  Gastrointestinal: Negative for vomiting.  Musculoskeletal: Negative for back pain.  Skin: Negative for rash.  Neurological: Negative for loss of consciousness and headaches.       Objective:    Physical Exam  Constitutional: She appears well-developed and well-nourished. No distress.  HENT:  Head: Normocephalic and atraumatic.  Eyes: Conjunctivae are normal.  Neck: Normal range of motion. No thyromegaly present.  Cardiovascular: Normal rate and regular rhythm.   Pulmonary/Chest: Effort normal and breath sounds normal. She has no wheezes.  Abdominal: Soft. Bowel sounds are normal. There is no tenderness.  Musculoskeletal: She exhibits no edema or deformity.  Neurological:  Pt able to answer most questions appropriately that were addressed to her but the majority of the conversation was between pt husband and 2 daughters--   Skin: Skin is warm and dry. She is not diaphoretic.  Psychiatric: She has a normal mood and affect.    BP 112/60 (BP Location: Left Arm, Patient Position: Sitting, Cuff Size: Normal)   Pulse 87   Temp 97 F (36.1 C) (Oral)   Ht 5\' 3"  (1.6 m)   Wt 124 lb (56.2 kg)   SpO2 97% Comment: RA  BMI 21.97 kg/m  Wt Readings from Last 3 Encounters:  10/14/16 124 lb (56.2 kg)  10/10/16 122 lb (55.3 kg)  10/06/16 123 lb 3.2 oz (55.9 kg)     Lab Results  Component Value Date   WBC 9.7 10/06/2016   HGB 12.6 10/06/2016   HCT 37.2 10/06/2016   PLT 285.0 10/06/2016   GLUCOSE 100 (H) 10/06/2016   CHOL 133 03/21/2016   TRIG 73 03/21/2016   HDL 67 03/21/2016   LDLCALC 51 03/21/2016   ALT 17 10/06/2016   AST 23 10/06/2016   NA 142 10/06/2016   K 5.1 10/06/2016   CL 106 10/06/2016   CREATININE 0.75 10/06/2016   [name] 13 10/06/2016   CO2 33 (H) 10/06/2016   TSH 2.11 10/06/2016    MICROALBUR 1.0 11/03/2014    Lab Results  Component Value Date   TSH 2.11 10/06/2016   Lab Results  Component Value Date   WBC 9.7 10/06/2016   HGB 12.6 10/06/2016   HCT 37.2 10/06/2016   MCV 86.7 10/06/2016   PLT 285.0  10/06/2016   Lab Results  Component Value Date   NA 142 10/06/2016   K 5.1 10/06/2016   CO2 33 (H) 10/06/2016   GLUCOSE 100 (H) 10/06/2016   [name] 13 10/06/2016   CREATININE 0.75 10/06/2016   BILITOT 0.4 10/06/2016   ALKPHOS 54 10/06/2016   AST 23 10/06/2016   ALT 17 10/06/2016   PROT 7.0 10/06/2016   ALBUMIN 4.3 10/06/2016   CALCIUM 9.5 10/06/2016   GFR 81.05 10/06/2016   Lab Results  Component Value Date   CHOL 133 03/21/2016   Lab Results  Component Value Date   HDL 67 03/21/2016   Lab Results  Component Value Date   LDLCALC 51 03/21/2016   Lab Results  Component Value Date   TRIG 73 03/21/2016   Lab Results  Component Value Date   CHOLHDL 2.0 03/21/2016   No results found for: HGBA1C     Assessment & Plan:   Problem List Items Addressed This Visit      Unprioritized   Tension-type headache, not intractable   Relevant Medications   nortriptyline (PAMELOR) 10 MG capsule   Mild cognitive impairment    Per neuro Changes made to contact info so the daughters will be called and DPR to be changed Pt should not be called with test results etc because of her memory issues        Relevant Medications   donepezil (ARICEPT) 10 MG tablet    Other Visit Diagnoses    GERD without esophagitis    -  Primary   Relevant Medications   esomeprazole (NEXIUM) 40 MG capsule   Essential hypertension       Relevant Medications   fluticasone (FLONASE) 50 MCG/ACT nasal spray   valsartan (DIOVAN) 160 MG tablet   pravastatin (PRAVACHOL) 40 MG tablet   Hyperlipidemia LDL goal <100       Relevant Medications   valsartan (DIOVAN) 160 MG tablet   pravastatin (PRAVACHOL) 40 MG tablet      I am having Ms. Mierzwa maintain her aspirin, ALIGN,  calcium carbonate, multivitamin, ALPHAGAN P, levothyroxine, PARoxetine, loratadine, benzonatate, HYDROcodone-homatropine, levocetirizine, donepezil, esomeprazole, fluticasone, nortriptyline, valsartan, and pravastatin.  Meds ordered this encounter  Medications  . donepezil (ARICEPT) 10 MG tablet    Sig: Take 1/2 tablet daily for 1 month, then increase to 1 tablet daily    Dispense:  30 tablet    Refill:  6  . esomeprazole (NEXIUM) 40 MG capsule    Sig: Take 1 capsule (40 mg total) by mouth daily at 12 noon.    Dispense:  30 capsule    Refill:  5  . fluticasone (FLONASE) 50 MCG/ACT nasal spray    Sig: Place 2 sprays into both nostrils daily.    Dispense:  16 g    Refill:  5  . nortriptyline (PAMELOR) 10 MG capsule    Sig: Take 1 capsule at night for 2 weeks, then increase to 2 capsules at night    Dispense:  60 capsule    Refill:  6  . valsartan (DIOVAN) 160 MG tablet    Sig: Take 1 tablet (160 mg total) by mouth daily.    Dispense:  90 tablet    Refill:  0  . pravastatin (PRAVACHOL) 40 MG tablet    Sig: Take 1 tablet (40 mg total) by mouth daily.    Dispense:  90 tablet    Refill:  1  pt and family her 1 hour--  >50% time discussing pt  memory problems, weight loss etc.  CMA served as scribe during this visit. History, Physical and Plan performed by medical provider. Documentation and orders reviewed and attested to.  Ann Held, DO

## 2016-10-15 ENCOUNTER — Ambulatory Visit: Payer: Medicare HMO | Admitting: Neurology

## 2016-10-15 NOTE — Addendum Note (Signed)
Addended by: Roma Schanz R on: 10/15/2016 01:43 PM   Modules accepted: Level of Service

## 2016-10-15 NOTE — Telephone Encounter (Signed)
Patients daughter Sharee Pimple notified. She states patient scheduled her PT on Monday.

## 2016-10-15 NOTE — Telephone Encounter (Signed)
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 There is good potential for recovery with physical therapy and avoiding further nerve compression by avoiding crossing her legs. Thanks

## 2016-10-17 ENCOUNTER — Encounter: Payer: Self-pay | Admitting: Internal Medicine

## 2016-10-17 ENCOUNTER — Ambulatory Visit (INDEPENDENT_AMBULATORY_CARE_PROVIDER_SITE_OTHER): Payer: Medicare HMO | Admitting: Internal Medicine

## 2016-10-17 VITALS — BP 120/60 | HR 80 | Ht 63.0 in | Wt 123.0 lb

## 2016-10-17 DIAGNOSIS — H903 Sensorineural hearing loss, bilateral: Secondary | ICD-10-CM | POA: Diagnosis not present

## 2016-10-17 DIAGNOSIS — R634 Abnormal weight loss: Secondary | ICD-10-CM

## 2016-10-17 DIAGNOSIS — Z86010 Personal history of colonic polyps: Secondary | ICD-10-CM

## 2016-10-17 NOTE — Progress Notes (Signed)
HISTORY OF PRESENT ILLNESS:  Michelle [name] is a 71 y.o. female with a history of GERD, adenomatous colon polyps, and other medical problems as listed presents today for follow-up regarding unexplained weight loss. She has seen her primary care provider as well as multiple specialists including neurology, pulmonology, and GI physician assistant as recently as 08/27/2016. See that dictation for details. Blood work and CT scan of the abdomen and pelvis were unremarkable. Medication for irritable bowel IBgard has helped. She is accompanied by her husband. Her weight at the time of her GI visit was 128 pounds. Today, 123 pounds. She has had somewhat decreased appetite over time. 30 pound weight loss since last February. Last colonoscopy January 2014 with moderate diverticulosis in the left colon and tubular adenoma. Recommended follow-up 5 years. Colonoscopies prior to that 2005 in 2008 with tubular adenomas. Upper endoscopy in January 2010 to evaluate chronic cough and Barrett's screening revealed benign antral polyp. Otherwise negative. Nothing new to report since her last visit. She denies diarrhea or steatorrhea. Appetite has improved slightly. She is on nutritional supplements. She is accompanied by her husband.  REVIEW OF SYSTEMS:  All non-GI ROS negative except for sinus and allergy, confusion, cough, headaches, hearing problems  Past Medical History:  Diagnosis Date  . Adenomatous colon polyp   . Allergy   . GERD (gastroesophageal reflux disease)   . Glaucoma   . Hx of hearing loss    bilateral hearing aids  . Hyperlipidemia   . Hypertension   . IBS (irritable bowel syndrome)   . Low back pain   . Thyroid disease     Past Surgical History:  Procedure Laterality Date  . ABDOMINAL HYSTERECTOMY  1998  . CATARACT EXTRACTION, BILATERAL Bilateral 04/2015  . CHOLECYSTECTOMY  1998  . [place] / 2013   skin graft on gums  . TUBAL LIGATION  1977    Social History Michelle [name]   reports that she has never smoked. She has never used smokeless tobacco. She reports that she does not drink alcohol or use drugs.  family history includes Breast cancer in her maternal grandmother; Cancer in her father; Cancer (age of onset: 80) in her brother; Emphysema in her mother; Glaucoma in her mother; Hypertension in her father and mother; Macular degeneration in her father; Other in her brother; Prostate cancer in her brother and father.  No Known Allergies     PHYSICAL EXAMINATION: Vital signs: BP 120/60   Pulse 80   Ht 5\' 3"  (1.6 m)   Wt 123 lb (55.8 kg)   BMI 21.79 kg/m   Constitutional: generally well-appearing, no acute distress Psychiatric: alert and oriented x3, cooperative Eyes: extraocular movements intact, anicteric, conjunctiva pink Mouth: oral pharynx moist, no lesions Neck: supple no lymphadenopathy Cardiovascular: heart regular rate and rhythm, no murmur Lungs: clear to auscultation bilaterally Abdomen: soft, nontender, nondistended, no obvious ascites, no peritoneal signs, normal bowel sounds, no organomegaly Rectal:Deferred until colonoscopy Extremities: no clubbing cyanosis or lower extremity edema bilaterally Skin: no lesions on visible extremities Neuro: No focal deficits. Cranial nerves intact. No asterixis.  ASSESSMENT:  #1. Unexplained weight loss. May in part be to decreased appetite. However, weight loss seems disproportionate to history of decreased appetite #2. History of adenomatous colon polyps. Was due for routine follow-up and 11 months. We will move this up given weight loss issues   PLAN:  #1. Colonoscopy to evaluate weight loss and provide neoplasia surveillance.The nature of the procedure, as well  as the risks, benefits, and alternatives were carefully and thoroughly reviewed with the patient. Ample time for discussion and questions allowed. The patient understood, was satisfied, and agreed to proceed. #2. Upper endoscopy to evaluate  unexplained weight loss.The nature of the procedure, as well as the risks, benefits, and alternatives were carefully and thoroughly reviewed with the patient. Ample time for discussion and questions allowed. The patient understood, was satisfied, and agreed to proceed.

## 2016-10-17 NOTE — Patient Instructions (Signed)

## 2016-10-21 ENCOUNTER — Ambulatory Visit: Payer: Medicare HMO | Attending: Neurology | Admitting: Physical Therapy

## 2016-10-21 DIAGNOSIS — M21372 Foot drop, left foot: Secondary | ICD-10-CM | POA: Diagnosis not present

## 2016-10-21 DIAGNOSIS — R2689 Other abnormalities of gait and mobility: Secondary | ICD-10-CM | POA: Insufficient documentation

## 2016-10-21 NOTE — Therapy (Signed)
[facility] [address] [place], [place], [address] Phone: [phone]   Fax:  [phone]  Physical Therapy Evaluation  Patient Details  Name: Michelle [name] MRN: [id number] Date of Birth: 22-Oct-1945 Referring Provider: Dr. Ellouise Newer  Encounter Date: 10/21/2016      PT End of Session - 10/21/16 1541    Visit Number 1   Number of Visits 12   Date for PT Re-Evaluation 12/02/16   Authorization Type Aetna Medicare   PT Start Time 1448   PT Stop Time 1529   PT Time Calculation (min) 41 min   Activity Tolerance Patient tolerated treatment well   Behavior During Therapy [facility] for tasks assessed/performed      Past Medical History:  Diagnosis Date  . Adenomatous colon polyp   . Allergy   . GERD (gastroesophageal reflux disease)   . Glaucoma   . Hx of hearing loss    bilateral hearing aids  . Hyperlipidemia   . Hypertension   . IBS (irritable bowel syndrome)   . Low back pain   . Thyroid disease     Past Surgical History:  Procedure Laterality Date  . ABDOMINAL HYSTERECTOMY  1998  . CATARACT EXTRACTION, BILATERAL Bilateral 04/2015  . CHOLECYSTECTOMY  1998  . [place] / 2013   skin graft on gums  . TUBAL LIGATION  1977    There were no vitals filed for this visit.       Subjective Assessment - 10/21/16 1450    Subjective Pt is a 71 y/o female who presents to OPPT for L foot drop and numbness x 2 months.  NCV revealed compression at knee, likely due to crossing legs.     Patient is accompained by: Family member  husband-Butch   Pertinent History recent unexplained weight loss, HTN, glaucoma   Limitations Walking;Standing   Diagnostic tests NCV: L common peroneal mononeuropathy   Patient Stated Goals improve mobility   Currently in Pain? No/denies            [facility] PT Assessment - 10/21/16 1458      Assessment   Medical Diagnosis L foot drop   Referring Provider Dr. Ellouise Newer    Onset Date/Surgical Date --  2 months ago   Next MD Visit April 2018   Prior Therapy none     Precautions   Precautions None     Restrictions   Weight Bearing Restrictions No     Balance Screen   Has the patient fallen in the past 6 months No   Has the patient had a decrease in activity level because of a fear of falling?  Yes  more cautious   Is the patient reluctant to leave their home because of a fear of falling?  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   Leisure shopping, go to [employer], play games on computer     Cognition   Memory Impaired   Memory Impairment Retrieval deficit;Decreased short term memory     Observation/Other Assessments   Focus on Therapeutic  Outcomes (FOTO)  61 (39% limited; predicted 28% limited)   Activities of Balance Confidence Scale (ABC Scale)  70.6%     Strength   Strength Assessment Site Hip;Knee;Ankle   Right/Left Hip Right;Left   Right Hip Flexion 4/5   Left Hip Flexion 4/5   Right/Left Knee Right;Left   Right Knee Flexion 5/5   Right Knee Extension 5/5   Left Knee Flexion 5/5   Left Knee Extension 5/5   Right/Left Ankle Right;Left   Right Ankle Dorsiflexion 5/5   Right Ankle Plantar Flexion 5/5   Right Ankle Inversion 5/5   Right Ankle Eversion 5/5   Left Ankle Dorsiflexion 1/5   Left Ankle Plantar Flexion 3/5   Left Ankle Inversion 2/5   Left Ankle Eversion 1/5     Flexibility   Soft Tissue Assessment /Muscle Length yes  tight gastroc/soleus LLE     Ambulation/Gait   Ambulation Distance (Feet) 100 Feet   Gait Pattern Left steppage   Ambulation  Surface Level;Indoor   Gait velocity 2.61 ft/sec  12.56 sec   Gait Comments trialed L foot up AFO with decreased L steppage gait.  Recommended to pt for safe mobility to decrease fall risk as she works to regain strength in L ankle.                                PT Long Term Goals - 10/21/16 1547      PT LONG TERM GOAL #1   Title indepedent with HEP (12/02/16)   Time 6   Period Weeks   Status New     PT LONG TERM GOAL #2   Title amb > 1000' with AFO on various indoor/outdoor surfaces modified independent for improved mobitliy and function (12/02/16)   Time 6   Period Weeks   Status New     PT LONG TERM GOAL #3   Title demonstrate 3/5 L ankle strength for improved strength and function (12/02/16)   Time 6   Period Weeks   Status New     PT LONG TERM GOAL #4   Title improve gait velocity to > 3.0 ft/sec for improved mobility (12/02/16)   Time 6   Period Weeks   Status New               Plan - 10/21/16 1541    Clinical Impression Statement Pt is a 71 y/o female who presents to OPPT for low complexity PT eval of L foot drop.  NCV revealed injury at knee due to compression with good potential for recovery.  At this time recommend L foot up AFO to help decrease foot drop and therefore fall risk while pt regains strength.  Will plan to see to improve strength and ambulation in order to maximize safe functional mobility.   Rehab Potential Excellent   Clinical Impairments Affecting Rehab Potential decreased motivation for exercise (per husband)   PT Frequency 2x / week   PT Duration 6 weeks  anticipate d/c in 4-6 wks   PT Treatment/Interventions ADLs/Self Care Home Management;Electrical Stimulation;Neuromuscular re-education;Balance training;Therapeutic exercise;Therapeutic activities;Functional mobility training;Stair training;Gait training;Patient/family education;Orthotic Fit/Training;Manual techniques;Passive range of motion;Vestibular   PT Next Visit  Plan review foot up AFO (if pt has obtained), establish HEP for gastroc stretching, L ankle strengthening (SLS, tandem stance/gait, can try seated but watch substitutions)   Consulted and Agree with Plan of Care Patient;Family member/caregiver   Family Member Consulted husband - "  Butch"      Patient will benefit from skilled therapeutic intervention in order to improve the following deficits and impairments:  Abnormal gait, Decreased strength, Decreased mobility, Impaired flexibility  Visit Diagnosis: Foot drop, left - Plan: PT plan of care cert/re-cert  Other abnormalities of gait and mobility - Plan: PT plan of care cert/re-cert      G-Codes - [id number]    Functional Assessment Tool Used clinical judgement: L foot drop with L ankle weakness   Functional Limitation Mobility: Walking and moving around   Mobility: Walking and Moving Around Current Status [id number]) At least 20 percent but less than 40 percent impaired, limited or restricted   Mobility: Walking and Moving Around Goal Status [phone]) At least 1 percent but less than 20 percent impaired, limited or restricted       Problem List Patient Active Problem List   Diagnosis Date Noted  . Left foot drop 10/14/2016  . Weight loss 10/14/2016  . Tension-type headache, not intractable 09/17/2016  . Meningioma ([place]) 06/05/2016  . Mild cognitive impairment 04/02/2016  . Depression 04/02/2016  . Memory loss 03/21/2016  . POSTMENOPAUSAL STATUS 07/26/2009  . SINUSITIS, ACUTE 01/17/2009  . INTESTINAL GAS 09/18/2008  . ONYCHOMYCOSIS, TOENAILS 08/14/2008  . GLAUCOMA 07/24/2008  . HYPERTENSION 07/24/2008  . ACUTE FRONTAL SINUSITIS 03/24/2008  . MYALGIA 02/29/2008  . COUGH 07/26/2007  . ADENOMATOUS COLONIC POLYP 05/20/2007  . DIVERTICULOSIS, COLON 05/20/2007  . HYPOTHYROIDISM 04/07/2007  . HYPERLIPIDEMIA 04/07/2007  . GERD 04/07/2007  . IBS 04/07/2007      Laureen Abrahams, PT, DPT 10/21/16 3:51 PM    [facility] [address] [place], [place], [address] Phone: [phone]   Fax:  [phone]  Name: Michelle [name] MRN: [id number] Date of Birth: [birth year]

## 2016-10-24 ENCOUNTER — Ambulatory Visit: Payer: Medicare HMO | Admitting: Physical Therapy

## 2016-10-24 ENCOUNTER — Telehealth: Payer: Self-pay | Admitting: *Deleted

## 2016-10-24 ENCOUNTER — Encounter: Payer: Self-pay | Admitting: Physical Therapy

## 2016-10-24 DIAGNOSIS — M21372 Foot drop, left foot: Secondary | ICD-10-CM

## 2016-10-24 DIAGNOSIS — R2689 Other abnormalities of gait and mobility: Secondary | ICD-10-CM

## 2016-10-24 NOTE — Telephone Encounter (Signed)
Scheduled AWV with Glenard Haring 10/31/16 @9 .

## 2016-10-24 NOTE — Therapy (Signed)
[facility] [address] [place], [place], [address] Phone: [phone]   Fax:  [phone]  Physical Therapy Treatment  Patient Details  Name: Michelle [name] MRN: [id number] Date of Birth: [birth year] Referring Provider: Dr. Ellouise Newer  Encounter Date: 10/24/2016      PT End of Session - 10/24/16 1125    Visit Number 2   Number of Visits 12   Date for PT Re-Evaluation 12/02/16   Authorization Type Aetna Medicare   PT Start Time [phone]   PT Stop Time 1018   PT Time Calculation (min) 45 min   Activity Tolerance Patient tolerated treatment well   Behavior During Therapy [facility] for tasks assessed/performed      Past Medical History:  Diagnosis Date  . Adenomatous colon polyp   . Allergy   . GERD (gastroesophageal reflux disease)   . Glaucoma   . Hx of hearing loss    bilateral hearing aids  . Hyperlipidemia   . Hypertension   . IBS (irritable bowel syndrome)   . Low back pain   . Thyroid disease     Past Surgical History:  Procedure Laterality Date  . ABDOMINAL HYSTERECTOMY  1998  . CATARACT EXTRACTION, BILATERAL Bilateral 04/2015  . CHOLECYSTECTOMY  1998  . [place] / 2013   skin graft on gums  . TUBAL LIGATION  1977    There were no vitals filed for this visit.      Subjective Assessment - 10/24/16 0936    Subjective Pt reports she has ordered a foot up brace, should be here some time next week.  Decreased pain in LLE, still reporting numbness. Asking about appropriate shoes to purchase.   Patient is accompained by: Family member   Pertinent History HOH and short term memory impairments-may repeat questions; recent unexplained weight loss, HTN, glaucoma   Limitations Walking;Standing   Diagnostic tests NCV: L common peroneal mononeuropathy   Patient Stated Goals improve mobility   Currently in Pain? No/denies           [facility] Adult PT Treatment/Exercise - 10/24/16 0938      Exercises   Exercises Ankle     Ankle Exercises: Stretches   Gastroc Stretch 2 reps;60 seconds   Gastroc Stretch Limitations LLE; seated pulling back with sheet     Ankle Exercises: Standing   Heel Raises Other (comment)   Heel Raises Limitations 12 reps, bilat UE support, bilat LE   Toe Raise Other (comment)   Toe Raise Limitations L foot only, L foot forwards, 12 reps   Heel Walk (Round Trip) 2 one UE support   Toe Walk (Round Trip) 2 one UE support   Other Standing Ankle Exercises tandem gait x 2 reps with one UE support          Balance Exercises - 10/24/16 1020      Balance Exercises: Standing   Standing Eyes Closed Narrow base of support (BOS);Wide (BOA);Foam/compliant surface;2 reps;10 secs           PT Education - 10/24/16 1121    Education provided Yes   Education Details HEP, shoe wear   Person(s) Educated Patient;Child(ren)   Methods Explanation;Demonstration;Handout   Comprehension Verbalized understanding;Returned demonstration;Verbal cues required            PT Long Term Goals - 10/21/16 1547      PT LONG TERM GOAL #1   Title indepedent with HEP (12/02/16)   Time 6  Period Weeks   Status New     PT LONG TERM GOAL #2   Title amb > 1000' with AFO on various indoor/outdoor surfaces modified independent for improved mobitliy and function (12/02/16)   Time 6   Period Weeks   Status New     PT LONG TERM GOAL #3   Title demonstrate 3/5 L ankle strength for improved strength and function (12/02/16)   Time 6   Period Weeks   Status New     PT LONG TERM GOAL #4   Title improve gait velocity to > 3.0 ft/sec for improved mobility (12/02/16)   Time 6   Period Weeks   Status New               Plan - 10/24/16 1125    Clinical Impression Statement Pt provided with LLE ankle ROM and strengthening HEP today; daughter present to observe and will give all instructions to pt's husband due to pt having short term memory deficits per daughter.  Pt  tolerated well; discussed proper shoe wear at home while performing exercises as pt asking if it would be possible to perform barefoot.  Discussed risk of ankle injury due to decreased sensation.  Also discussed appropriate shoe wear to purchase with pt and daughter.  Advised pt to bring in foot up brace next week once delivered for gait training.  Pt tolerated all exercises well but will require instructions at home.   Rehab Potential Excellent   Clinical Impairments Affecting Rehab Potential decreased motivation for exercise (per husband)   PT Treatment/Interventions ADLs/Self Care Home Management;Electrical Stimulation;Neuromuscular re-education;Balance training;Therapeutic exercise;Therapeutic activities;Functional mobility training;Stair training;Gait training;Patient/family education;Orthotic Fit/Training;Manual techniques;Passive range of motion;Vestibular   PT Next Visit Plan review foot up AFO (if pt has obtained), L ankle strengthening and balance in standing, corner exercises with compliant surface   Consulted and Agree with Plan of Care Patient;Family member/caregiver   Family Member Consulted daughter      Patient will benefit from skilled therapeutic intervention in order to improve the following deficits and impairments:     Visit Diagnosis: Foot drop, left  Other abnormalities of gait and mobility     Problem List Patient Active Problem List   Diagnosis Date Noted  . Left foot drop 10/14/2016  . Weight loss 10/14/2016  . Tension-type headache, not intractable 09/17/2016  . Meningioma ([place]) 06/05/2016  . Mild cognitive impairment 04/02/2016  . Depression 04/02/2016  . Memory loss 03/21/2016  . POSTMENOPAUSAL STATUS 07/26/2009  . SINUSITIS, ACUTE 01/17/2009  . INTESTINAL GAS 09/18/2008  . ONYCHOMYCOSIS, TOENAILS 08/14/2008  . GLAUCOMA 07/24/2008  . HYPERTENSION 07/24/2008  . ACUTE FRONTAL SINUSITIS 03/24/2008  . MYALGIA 02/29/2008  . COUGH 07/26/2007  .  ADENOMATOUS COLONIC POLYP 05/20/2007  . DIVERTICULOSIS, COLON 05/20/2007  . HYPOTHYROIDISM 04/07/2007  . HYPERLIPIDEMIA 04/07/2007  . GERD 04/07/2007  . IBS 04/07/2007    Raylene Everts, PT, DPT 10/24/16    11:31 AM    [facility] [address] [place], [place], [address] Phone: [phone]   Fax:  [phone]  Name: Michelle [name] MRN: [id number] Date of Birth: [birth year]

## 2016-10-24 NOTE — Patient Instructions (Signed)
Ankle Plantar Flexion / Dorsiflexion, Standing    Stand while holding a stable object. Rise up on toes. Then rock back on heels. Hold each position _2-3__ seconds. Repeat _12__ times per session. Do _2__ sessions per day.  Single Leg - Eyes Open    Holding support, stand on right leg while maintaining balance over other leg. Progress to removing hands from support surface for longer periods of time. Hold__10__ seconds.  Repeat standing on left leg Repeat _2___ times per session. Do __2__ sessions per day.  Copyright  VHI. All rights reserved.  Feet Heel-Toe "Tandem", Varied Arm Positions - Eyes Open    With eyes open, right foot directly in front of the other, arms out, look straight ahead at a stationary object. Hold on to counter for support. Hold __10__ seconds.  Repeat with left foot in front of right.  Hold 10 seconds Repeat __2__ times per session. Do __2__ sessions per day.  Copyright  VHI. All rights reserved.  Copyright  VHI. All rights reserved.  Walking on Toes    Walk on toes for __10__ feet while continuing on a straight path.   Hold on for support Do _2___ sessions per day.  Copyright  VHI. All rights reserved.  Walking on Heels    Walk on heels for _10__ feet while continuing on a straight path.  Hold on for support Do _2__ sessions per day.  Feet Heel-Toe "Tandem"    Arms outstretched, walk a straight line bringing one foot directly in front of the other x 10 feet.  Hold on for support . Do __2__ sessions per day.  Copyright  VHI. All rights reserved.

## 2016-10-27 ENCOUNTER — Ambulatory Visit: Payer: Medicare HMO | Admitting: Physical Therapy

## 2016-10-27 DIAGNOSIS — M21372 Foot drop, left foot: Secondary | ICD-10-CM | POA: Diagnosis not present

## 2016-10-27 DIAGNOSIS — R2689 Other abnormalities of gait and mobility: Secondary | ICD-10-CM

## 2016-10-27 NOTE — Therapy (Signed)
[facility] [address] [place], [place], [address] Phone: [phone]   Fax:  [phone]  Physical Therapy Treatment  Patient Details  Name: Michelle [name] MRN: [id number] Date of Birth: 19-Nov-1945 Referring Provider: Dr. Ellouise Newer  Encounter Date: 10/27/2016      PT End of Session - 10/27/16 1607    Visit Number 3   Number of Visits 12   Date for PT Re-Evaluation 12/02/16   Authorization Type Aetna Medicare   PT Start Time [id number]   PT Stop Time 1609   PT Time Calculation (min) 38 min   Activity Tolerance Patient tolerated treatment well   Behavior During Therapy [facility] for tasks assessed/performed      Past Medical History:  Diagnosis Date  . Adenomatous colon polyp   . Allergy   . GERD (gastroesophageal reflux disease)   . Glaucoma   . Hx of hearing loss    bilateral hearing aids  . Hyperlipidemia   . Hypertension   . IBS (irritable bowel syndrome)   . Low back pain   . Thyroid disease     Past Surgical History:  Procedure Laterality Date  . ABDOMINAL HYSTERECTOMY  1998  . CATARACT EXTRACTION, BILATERAL Bilateral 04/2015  . CHOLECYSTECTOMY  1998  . [place] / 2013   skin graft on gums  . TUBAL LIGATION  1977    There were no vitals filed for this visit.      Subjective Assessment - 10/27/16 1533    Subjective got brace on saturday.  has been using it since then.     Pertinent History HOH and short term memory impairments-may repeat questions; recent unexplained weight loss, HTN, glaucoma   Limitations Walking;Standing   Diagnostic tests NCV: L common peroneal mononeuropathy   Patient Stated Goals improve mobility   Currently in Pain? No/denies                         [facility] Adult PT Treatment/Exercise - 10/27/16 1541      Ambulation/Gait   Ambulation/Gait Yes   Ambulation/Gait Assistance 7: Independent   Ambulation Distance (Feet) 115 Feet   Gait Comments  with foot up AFO and improved foot clearance on LLE     Self-Care   Self-Care Other Self-Care Comments   Other Self-Care Comments  educated on proper application of foot up     Ankle Exercises: Standing   BAPS Sitting;10 reps  fitter first; DF/PF, INV/EV, circles   SLS 2x10 sec on level ground; post weight shift on LLE only 5x10 sec; 5x5-10 sec on compliant surface   Heel Raises Other (comment)  12 reps   Toe Raise Other (comment)  12 seconds   Toe Raise Limitations L foot only, L foot forwards, 12 reps   Heel Walk (Round Trip) 2 one UE support   Toe Walk (Round Trip) 2 one UE support   Other Standing Ankle Exercises tandem stand 2x15 sec bil   Other Standing Ankle Exercises tandem walking 10'x2 with 1UE support     Ankle Exercises: Stretches   Gastroc Stretch 3 reps;30 seconds   Gastroc Stretch Limitations LLE; seated pulling back with sheet                PT Education - 10/27/16 1607    Education provided Yes   Education Details correct application of brace   Person(s) Educated Patient   Methods Explanation;Demonstration   Comprehension Verbalized  understanding             PT Long Term Goals - 10/21/16 1547      PT LONG TERM GOAL #1   Title indepedent with HEP (12/02/16)   Time 6   Period Weeks   Status New     PT LONG TERM GOAL #2   Title amb > 1000' with AFO on various indoor/outdoor surfaces modified independent for improved mobitliy and function (12/02/16)   Time 6   Period Weeks   Status New     PT LONG TERM GOAL #3   Title demonstrate 3/5 L ankle strength for improved strength and function (12/02/16)   Time 6   Period Weeks   Status New     PT LONG TERM GOAL #4   Title improve gait velocity to > 3.0 ft/sec for improved mobility (12/02/16)   Time 6   Period Weeks   Status New               Plan - 10/27/16 1608    Clinical Impression Statement Pt tolerated exercises well, still unable to isolate dorsiflexion but able to activate in  weight bearing.  Advised to do all exercises with brace uncliped to maximize benefit.  Will continue to benefit from PT to maximize function.   Clinical Impairments Affecting Rehab Potential decreased motivation for exercise (per husband)   PT Treatment/Interventions ADLs/Self Care Home Management;Electrical Stimulation;Neuromuscular re-education;Balance training;Therapeutic exercise;Therapeutic activities;Functional mobility training;Stair training;Gait training;Patient/family education;Orthotic Fit/Training;Manual techniques;Passive range of motion;Vestibular   PT Next Visit Plan L ankle strengthening and balance in standing, corner exercises with compliant surface   Consulted and Agree with Plan of Care Patient      Patient will benefit from skilled therapeutic intervention in order to improve the following deficits and impairments:  Abnormal gait, Decreased strength, Decreased mobility, Impaired flexibility  Visit Diagnosis: Foot drop, left  Other abnormalities of gait and mobility     Problem List Patient Active Problem List   Diagnosis Date Noted  . Left foot drop 10/14/2016  . Weight loss 10/14/2016  . Tension-type headache, not intractable 09/17/2016  . Meningioma ([place]) 06/05/2016  . Mild cognitive impairment 04/02/2016  . Depression 04/02/2016  . Memory loss 03/21/2016  . POSTMENOPAUSAL STATUS 07/26/2009  . SINUSITIS, ACUTE 01/17/2009  . INTESTINAL GAS 09/18/2008  . ONYCHOMYCOSIS, TOENAILS 08/14/2008  . GLAUCOMA 07/24/2008  . HYPERTENSION 07/24/2008  . ACUTE FRONTAL SINUSITIS 03/24/2008  . MYALGIA 02/29/2008  . COUGH 07/26/2007  . ADENOMATOUS COLONIC POLYP 05/20/2007  . DIVERTICULOSIS, COLON 05/20/2007  . HYPOTHYROIDISM 04/07/2007  . HYPERLIPIDEMIA 04/07/2007  . GERD 04/07/2007  . IBS 04/07/2007      Laureen Abrahams, PT, DPT 10/27/16 4:10 PM    [facility] [address] [place], [place],  [address] Phone: [phone]   Fax:  [phone]  Name: GLENDELL GREB MRN: [id number] Date of Birth: 1946/06/17

## 2016-10-28 ENCOUNTER — Ambulatory Visit: Payer: Medicare HMO | Admitting: Physical Therapy

## 2016-10-28 NOTE — Progress Notes (Signed)
Pre visit review using our clinic review tool, if applicable. No additional management support is needed unless otherwise documented below in the visit note. 

## 2016-10-28 NOTE — Progress Notes (Signed)
Subjective:   Michelle [name] is a 71 y.o. female who presents for Medicare Annual (Subsequent) preventive examination.  Review of Systems:  No ROS.  Medicare Wellness Visit.  Cardiac Risk Factors include: advanced age (>69men, >46 women);hypertension;dyslipidemia;sedentary lifestyle Sleep patterns: Sleeps 6-8 hrs per night. Feels rested. Home Safety/Smoke Alarms:  Feels safe in home. Smoke alarms in place.  Living environment; residence and Adult nurse: lives with husband in 1 story home. Guns safely stored. Seat Belt Safety/Bike Helmet: Wears seat belt.   Counseling:   Eye Exam- Dr.Digby every 6 months.   Dental- Dr.Cates every 6 months.  Female:   Pap-Hysterectomy.       Mammo- Last 07/09/13: no result on file. Pt states she will schedule. Dexa scan-  Last 11/12/14: Osteopenia     CCS- Last 10/08/12: Polyp removed. Moderate diverticulosis. Recall 5 yrs per report.     Objective:     Vitals: BP (!) 104/58 (BP Location: Left Arm, Patient Position: Sitting, Cuff Size: Normal)   Pulse 83   Temp 98 F (36.7 C) (Oral)   Resp 16   Ht 5\' 3"  (1.6 m)   Wt 120 lb 9.6 oz (54.7 kg)   SpO2 98%   BMI 21.36 kg/m   Body mass index is 21.36 kg/m.   Tobacco History  Smoking Status  . Never Smoker  Smokeless Tobacco  . Never Used     Counseling given: Not Answered   Past Medical History:  Diagnosis Date  . Adenomatous colon polyp   . Allergy   . GERD (gastroesophageal reflux disease)   . Glaucoma   . Hx of hearing loss    bilateral hearing aids  . Hyperlipidemia   . Hypertension   . IBS (irritable bowel syndrome)   . Low back pain   . Thyroid disease    Past Surgical History:  Procedure Laterality Date  . ABDOMINAL HYSTERECTOMY  1998  . CATARACT EXTRACTION, BILATERAL Bilateral 04/2015  . CHOLECYSTECTOMY  1998  . [place] / 2013   skin graft on gums  . TUBAL LIGATION  1977   Family History  Problem Relation Age of Onset  . Macular degeneration  Father   . Prostate cancer Father   . Hypertension Father   . Cancer Father     prostate  . Glaucoma Mother   . Emphysema Mother   . Hypertension Mother   . Other Brother     Brain tumor  . Cancer Brother 53    brain tumor  . Breast cancer Maternal Grandmother   . Prostate cancer Brother    History  Sexual Activity  . Sexual activity: Yes  . Partners: Male  . Birth control/ protection: Surgical    Outpatient Encounter Prescriptions as of 10/31/2016  Medication Sig  . ALPHAGAN P 0.1 % SOLN   . AMBULATORY NON FORMULARY MEDICATION IBgard: Takes prn  . aspirin 81 MG tablet Take 160 mg by mouth daily.  . benzonatate (TESSALON) 200 MG capsule Take 1 capsule (200 mg total) by mouth 3 (three) times daily as needed for cough.  . calcium carbonate (OS-CAL) 600 MG TABS Take 600 mg by mouth 2 (two) times daily with a meal.  . donepezil (ARICEPT) 10 MG tablet Take 1/2 tablet daily for 1 month, then increase to 1 tablet daily  . esomeprazole (NEXIUM) 40 MG capsule Take 1 capsule (40 mg total) by mouth daily at 12 noon.  . fluticasone (FLONASE) 50 MCG/ACT nasal spray Place  2 sprays into both nostrils daily.  Marland Kitchen levocetirizine (XYZAL) 5 MG tablet Take 1 tablet by mouth daily.  Marland Kitchen levothyroxine (SYNTHROID, LEVOTHROID) 50 MCG tablet TAKE 1 TABLET (50 MCG TOTAL) BY MOUTH DAILY.  Marland Kitchen loratadine (CLARITIN) 10 MG tablet Take 1 tablet (10 mg total) by mouth daily.  . Multiple Vitamin (MULTIVITAMIN) tablet Take 1 tablet by mouth daily.  . nortriptyline (PAMELOR) 10 MG capsule Take 1 capsule at night for 2 weeks, then increase to 2 capsules at night  . PARoxetine (PAXIL) 10 MG tablet Take 1/2 tablet daily for 1 week, then increase to 1 tablet  . pravastatin (PRAVACHOL) 40 MG tablet Take 1 tablet (40 mg total) by mouth daily.  . Probiotic Product (ALIGN) 4 MG CAPS Take 1 capsule by mouth daily.  . valsartan (DIOVAN) 160 MG tablet Take 1 tablet (160 mg total) by mouth daily.  Marland Kitchen HYDROcodone-homatropine  (HYCODAN) 5-1.5 MG/5ML syrup Take 5 mLs by mouth every 6 (six) hours as needed for cough. (Patient not taking: Reported on 10/31/2016)   No facility-administered encounter medications on file as of 10/31/2016.     Activities of Daily Living In your present state of health, do you have any difficulty performing the following activities: 10/31/2016 10/31/2016  Hearing? N Y  Vision? N N  Difficulty concentrating or making decisions? Tempie Donning  Walking or climbing stairs? N N  Dressing or bathing? N N  Doing errands, shopping? N N  Preparing Food and eating ? N -  Using the Toilet? N -  In the past six months, have you accidently leaked urine? N -  Do you have problems with loss of bowel control? N -  Managing your Medications? N -  Managing your Finances? N -  Housekeeping or managing your Housekeeping? N -  Some recent data might be hidden    Patient Care Team: Ann Held, DO as PCP - General Calvert Cantor, MD as Consulting Physician (Ophthalmology) Irene Shipper, MD as Consulting Physician (Gastroenterology) Cameron Sprang, MD as Consulting Physician (Neurology)    Assessment:    Physical assessment deferred to PCP.  Exercise Activities and Dietary recommendations Current Exercise Habits: The patient does not participate in regular exercise at present, Exercise limited by: None identified   Diet (meal preparation, eat out, water intake, caffeinated beverages, dairy products, fruits and vegetables): in general, a "healthy" diet  , on average, 2 meals per day   Goals    . would like to tone legs and arms      Fall Risk Fall Risk  10/31/2016 10/31/2016 10/10/2016 09/10/2016 06/05/2016  Falls in the past year? No No No No No   Depression Screen PHQ 2/9 Scores 10/31/2016 10/31/2016 03/21/2016 11/03/2014  PHQ - 2 Score 0 0 0 0     Cognitive Function MMSE - Mini Mental State Exam 10/31/2016  Orientation to time 5  Orientation to Place 5  Registration 3  Attention/ Calculation 5    Recall 0  Language- name 2 objects 2  Language- repeat 0  Language- follow 3 step command 3  Language- read & follow direction 1  Write a sentence 1  Copy design 1  Total score 26   Montreal Cognitive Assessment  04/02/2016  Visuospatial/ Executive (0/5) 3  Naming (0/3) 3  Attention: Read list of digits (0/2) 1  Attention: Read list of letters (0/1) 1  Attention: Serial 7 subtraction starting at 100 (0/3) 3  Language: Repeat phrase (0/2) 2  Language :  Fluency (0/1) 1  Abstraction (0/2) 1  Delayed Recall (0/5) 1  Orientation (0/6) 5  Total 21  Adjusted Score (based on education) 21      Immunization History  Administered Date(s) Administered  . Influenza Split 09/23/2011  . Influenza Whole 07/26/2007, 06/30/2008, 07/26/2009, 09/12/2010  . Influenza, High Dose Seasonal PF 06/15/2013, 10/05/2015  . Influenza,inj,Quad PF,36+ Mos 06/19/2014, 10/02/2016  . Pneumococcal Conjugate-13 11/03/2014  . Pneumococcal Polysaccharide-23 09/23/2011  . Td 07/02/2005  . Zoster 11/14/2008   Screening Tests Health Maintenance  Topic Date Due  . Hepatitis C Screening  11-Jul-1946  . TETANUS/TDAP  07/03/2015  . MAMMOGRAM  02/28/2017 (Originally 07/26/2016)  . COLONOSCOPY  10/08/2017  . PAP SMEAR  10/31/2018  . INFLUENZA VACCINE  Completed  . DEXA SCAN  Completed  . PNA vac Low Risk Adult  Completed      Plan:    During the course of the visit the patient was educated and counseled about the following appropriate screening and preventive services:   Vaccines to include Pneumoccal, Influenza, Hepatitis B, Td, Zostavax, HCV  Cardiovascular Disease  Colorectal cancer screening  Bone density screening  Diabetes screening  Glaucoma screening  Mammography/PAP  Nutrition counseling   Patient Instructions (the written plan) was given to the patient.   Naaman Plummer [place], [place]  10/31/2016

## 2016-10-29 ENCOUNTER — Other Ambulatory Visit: Payer: Self-pay

## 2016-10-29 MED ORDER — NA SULFATE-K SULFATE-MG SULF 17.5-3.13-1.6 GM/177ML PO SOLN: 1.0000 | Freq: Once | ORAL | 0 refills | Status: AC

## 2016-10-31 ENCOUNTER — Encounter: Payer: Self-pay | Admitting: Family Medicine

## 2016-10-31 ENCOUNTER — Encounter: Payer: Self-pay | Admitting: Physical Therapy

## 2016-10-31 ENCOUNTER — Ambulatory Visit: Payer: Medicare HMO | Admitting: Physical Therapy

## 2016-10-31 ENCOUNTER — Ambulatory Visit (INDEPENDENT_AMBULATORY_CARE_PROVIDER_SITE_OTHER): Payer: Medicare HMO | Admitting: Family Medicine

## 2016-10-31 VITALS — BP 104/58 | HR 83 | Temp 98.0°F | Resp 16 | Ht 63.0 in | Wt 120.6 lb

## 2016-10-31 DIAGNOSIS — M21372 Foot drop, left foot: Secondary | ICD-10-CM

## 2016-10-31 DIAGNOSIS — E039 Hypothyroidism, unspecified: Secondary | ICD-10-CM

## 2016-10-31 DIAGNOSIS — I10 Essential (primary) hypertension: Secondary | ICD-10-CM

## 2016-10-31 DIAGNOSIS — E785 Hyperlipidemia, unspecified: Secondary | ICD-10-CM | POA: Diagnosis not present

## 2016-10-31 DIAGNOSIS — Z780 Asymptomatic menopausal state: Secondary | ICD-10-CM

## 2016-10-31 DIAGNOSIS — Z0000 Encounter for general adult medical examination without abnormal findings: Secondary | ICD-10-CM | POA: Diagnosis not present

## 2016-10-31 DIAGNOSIS — R2689 Other abnormalities of gait and mobility: Secondary | ICD-10-CM

## 2016-10-31 LAB — LIPID PANEL
CHOLESTEROL: 144 mg/dL (ref 0–200)
HDL: 73.3 mg/dL (ref >39.00)
LDL Cholesterol: 60 mg/dL (ref 0–99)
NonHDL: 70.7
TRIGLYCERIDES: 56 mg/dL (ref 0.0–149.0)
Total CHOL/HDL Ratio: 2
VLDL: 11.2 mg/dL (ref 0.0–40.0)

## 2016-10-31 NOTE — Assessment & Plan Note (Signed)
See avs ghm utd Check labs  Pt to see RN for medicare wellness

## 2016-10-31 NOTE — Patient Instructions (Addendum)
Plantar Flexion: Resisted    Sit with left leg supported on bed/recliner with foot hanging off edge of surface: Use band to pull toes as far toward you as possible. Then push foot down against the resistance of the band. Then, slowly allow foot to return to starting position. Repeat 10 reps per session. Do _1-2_ sessions per day.  http://orth.exer.us/11   Copyright  VHI. All rights reserved.

## 2016-10-31 NOTE — Patient Instructions (Signed)
Preventive Care 65 Years and Older, Female Preventive care refers to lifestyle choices and visits with your health care provider that can promote health and wellness. What does preventive care include?  A yearly physical exam. This is also called an annual well check.  Dental exams once or twice a year.  Routine eye exams. Ask your health care provider how often you should have your eyes checked.  Personal lifestyle choices, including:  Daily care of your teeth and gums.  Regular physical activity.  Eating a healthy diet.  Avoiding tobacco and drug use.  Limiting alcohol use.  Practicing safe sex.  Taking low-dose aspirin every day.  Taking vitamin and mineral supplements as recommended by your health care provider. What happens during an annual well check? The services and screenings done by your health care provider during your annual well check will depend on your age, overall health, lifestyle risk factors, and family history of disease. Counseling  Your health care provider may ask you questions about your:  Alcohol use.  Tobacco use.  Drug use.  Emotional well-being.  Home and relationship well-being.  Sexual activity.  Eating habits.  History of falls.  Memory and ability to understand (cognition).  Work and work environment.  Reproductive health. Screening  You may have the following tests or measurements:  Height, weight, and BMI.  Blood pressure.  Lipid and cholesterol levels. These may be checked every 5 years, or more frequently if you are over 50 years old.  Skin check.  Lung cancer screening. You may have this screening every year starting at age 55 if you have a 30-pack-year history of smoking and currently smoke or have quit within the past 15 years.  Fecal occult blood test (FOBT) of the stool. You may have this test every year starting at age 71.  Flexible sigmoidoscopy or colonoscopy. You may have a sigmoidoscopy every 5 years or  a colonoscopy every 10 years starting at age 71.  Hepatitis C blood test.  Hepatitis B blood test.  Sexually transmitted disease (STD) testing.  Diabetes screening. This is done by checking your blood sugar (glucose) after you have not eaten for a while (fasting). You may have this done every 1-3 years.  Bone density scan. This is done to screen for osteoporosis. You may have this done starting at age 71.  Mammogram. This may be done every 1-2 years. Talk to your health care provider about how often you should have regular mammograms. Talk with your health care provider about your test results, treatment options, and if necessary, the need for more tests. Vaccines  Your health care provider may recommend certain vaccines, such as:  Influenza vaccine. This is recommended every year.  Tetanus, diphtheria, and acellular pertussis (Tdap, Td) vaccine. You may need a Td booster every 10 years.  Varicella vaccine. You may need this if you have not been vaccinated.  Zoster vaccine. You may need this after age 60.  Measles, mumps, and rubella (MMR) vaccine. You may need at least one dose of MMR if you were born in 1957 or later. You may also need a second dose.  Pneumococcal 13-valent conjugate (PCV13) vaccine. One dose is recommended after age 71.  Pneumococcal polysaccharide (PPSV23) vaccine. One dose is recommended after age 71.  Meningococcal vaccine. You may need this if you have certain conditions.  Hepatitis A vaccine. You may need this if you have certain conditions or if you travel or work in places where you may be exposed to   hepatitis A.  Hepatitis B vaccine. You may need this if you have certain conditions or if you travel or work in places where you may be exposed to hepatitis B.  Haemophilus influenzae type b (Hib) vaccine. You may need this if you have certain conditions. Talk to your health care provider about which screenings and vaccines you need and how often you need  them. This information is not intended to replace advice given to you by your health care provider. Make sure you discuss any questions you have with your health care provider. Document Released: 09/21/2015 Document Revised: 05/14/2016 Document Reviewed: 06/26/2015 Elsevier Interactive Patient Education  2017 Elsevier Inc.  

## 2016-10-31 NOTE — Progress Notes (Signed)
Subjective:    Patient ID: Michelle [name], female    DOB: 12-02-1945, 71 y.o.   MRN: [id number]   I acted as a Education administrator for Dr. Royden Purl, LPN   Pt is here for cpe and medicare wellness with RN.  No complaints from pt.  Her memory is worsening slowly but she sees neuro and she is seeing GI for weight loss.  A colonoscopy is being scheduled.  Hypertension  This is a chronic problem. The current episode started more than 1 year ago. The problem is controlled. Pertinent negatives include no blurred vision, chest pain, headaches or palpitations.  Hyperlipidemia  Pertinent negatives include no chest pain.    Patient is in today for annual physical.  Past Medical History:  Diagnosis Date  . Adenomatous colon polyp   . Allergy   . GERD (gastroesophageal reflux disease)   . Glaucoma   . Hx of hearing loss    bilateral hearing aids  . Hyperlipidemia   . Hypertension   . IBS (irritable bowel syndrome)   . Low back pain   . Thyroid disease     Past Surgical History:  Procedure Laterality Date  . ABDOMINAL HYSTERECTOMY  1998  . CATARACT EXTRACTION, BILATERAL Bilateral 04/2015  . CHOLECYSTECTOMY  1998  . [place] / 2013   skin graft on gums  . TUBAL LIGATION  1977    Family History  Problem Relation Age of Onset  . Macular degeneration Father   . Prostate cancer Father   . Hypertension Father   . Cancer Father     prostate  . Glaucoma Mother   . Emphysema Mother   . Hypertension Mother   . Other Brother     Brain tumor  . Cancer Brother 41    brain tumor  . Breast cancer Maternal Grandmother   . Prostate cancer Brother     Social History   Social History  . Marital status: Married    Spouse name: Juanda Crumble, "Butch"  . Number of children: 2  . Years of education: N/A   Occupational History  . retired Unemployed   Social History Main Topics  . Smoking status: Never Smoker  . Smokeless tobacco: Never Used  . Alcohol use No  . Drug use: No  .  Sexual activity: Yes    Partners: Male    Birth control/ protection: Surgical   Other Topics Concern  . Not on file   Social History Narrative   Exercise--no    Outpatient Medications Prior to Visit  Medication Sig Dispense Refill  . ALPHAGAN P 0.1 % SOLN     . AMBULATORY NON FORMULARY MEDICATION IBgard: Takes prn    . aspirin 81 MG tablet Take 160 mg by mouth daily.    . benzonatate (TESSALON) 200 MG capsule Take 1 capsule (200 mg total) by mouth 3 (three) times daily as needed for cough. 90 capsule 1  . calcium carbonate (OS-CAL) 600 MG TABS Take 600 mg by mouth 2 (two) times daily with a meal.    . donepezil (ARICEPT) 10 MG tablet Take 1/2 tablet daily for 1 month, then increase to 1 tablet daily 30 tablet 6  . esomeprazole (NEXIUM) 40 MG capsule Take 1 capsule (40 mg total) by mouth daily at 12 noon. 30 capsule 5  . fluticasone (FLONASE) 50 MCG/ACT nasal spray Place 2 sprays into both nostrils daily. 16 g 5  . levocetirizine (XYZAL) 5 MG tablet Take 1 tablet by  mouth daily.  5  . levothyroxine (SYNTHROID, LEVOTHROID) 50 MCG tablet TAKE 1 TABLET (50 MCG TOTAL) BY MOUTH DAILY. 30 tablet 5  . loratadine (CLARITIN) 10 MG tablet Take 1 tablet (10 mg total) by mouth daily. 30 tablet 5  . Multiple Vitamin (MULTIVITAMIN) tablet Take 1 tablet by mouth daily.    . nortriptyline (PAMELOR) 10 MG capsule Take 1 capsule at night for 2 weeks, then increase to 2 capsules at night 60 capsule 6  . PARoxetine (PAXIL) 10 MG tablet Take 1/2 tablet daily for 1 week, then increase to 1 tablet 30 tablet 6  . pravastatin (PRAVACHOL) 40 MG tablet Take 1 tablet (40 mg total) by mouth daily. 90 tablet 1  . Probiotic Product (ALIGN) 4 MG CAPS Take 1 capsule by mouth daily.    . valsartan (DIOVAN) 160 MG tablet Take 1 tablet (160 mg total) by mouth daily. 90 tablet 0  . HYDROcodone-homatropine (HYCODAN) 5-1.5 MG/5ML syrup Take 5 mLs by mouth every 6 (six) hours as needed for cough. (Patient not taking: Reported  on 10/31/2016) 240 mL 0   No facility-administered medications prior to visit.     No Known Allergies  Review of Systems  Constitutional: Negative for fever.  HENT: Negative for congestion.   Eyes: Negative for blurred vision.  Respiratory: Negative for cough.   Cardiovascular: Negative for chest pain and palpitations.  Gastrointestinal: Negative for vomiting.  Musculoskeletal: Negative for back pain.  Skin: Negative for rash.  Neurological: Negative for loss of consciousness and headaches.       Objective:    Physical Exam  Constitutional: She is oriented to person, place, and time. She appears well-developed and well-nourished. No distress.  HENT:  Head: Normocephalic and atraumatic.  Eyes: Conjunctivae are normal. Pupils are equal, round, and reactive to light.  Neck: Normal range of motion. No thyromegaly present.  Cardiovascular: Normal rate and regular rhythm.   Pulmonary/Chest: Effort normal and breath sounds normal. She has no wheezes.  Abdominal: Soft. Bowel sounds are normal. There is no tenderness.  Musculoskeletal: Normal range of motion. She exhibits no edema or deformity.  Neurological: She is alert and oriented to person, place, and time.  Skin: Skin is warm and dry. She is not diaphoretic.  Psychiatric: She has a normal mood and affect. She exhibits abnormal recent memory.  Memory worsening per pt   Nursing note and vitals reviewed.   BP (!) 104/58 (BP Location: Left Arm, Patient Position: Sitting, Cuff Size: Normal)   Pulse 83   Temp 98 F (36.7 C) (Oral)   Resp 16   Ht 5\' 3"  (1.6 m)   Wt 120 lb 9.6 oz (54.7 kg)   SpO2 98%   BMI 21.36 kg/m  Wt Readings from Last 3 Encounters:  10/31/16 120 lb 9.6 oz (54.7 kg)  10/17/16 123 lb (55.8 kg)  10/14/16 124 lb (56.2 kg)     Lab Results  Component Value Date   WBC 9.7 10/06/2016   HGB 12.6 10/06/2016   HCT 37.2 10/06/2016   PLT 285.0 10/06/2016   GLUCOSE 100 (H) 10/06/2016   CHOL 133 03/21/2016    TRIG 73 03/21/2016   HDL 67 03/21/2016   LDLCALC 51 03/21/2016   ALT 17 10/06/2016   AST 23 10/06/2016   NA 142 10/06/2016   K 5.1 10/06/2016   CL 106 10/06/2016   CREATININE 0.75 10/06/2016   [name] 13 10/06/2016   CO2 33 (H) 10/06/2016   TSH 2.11 10/06/2016  MICROALBUR 1.0 11/03/2014    Lab Results  Component Value Date   TSH 2.11 10/06/2016   Lab Results  Component Value Date   WBC 9.7 10/06/2016   HGB 12.6 10/06/2016   HCT 37.2 10/06/2016   MCV 86.7 10/06/2016   PLT 285.0 10/06/2016   Lab Results  Component Value Date   NA 142 10/06/2016   K 5.1 10/06/2016   CO2 33 (H) 10/06/2016   GLUCOSE 100 (H) 10/06/2016   [name] 13 10/06/2016   CREATININE 0.75 10/06/2016   BILITOT 0.4 10/06/2016   ALKPHOS 54 10/06/2016   AST 23 10/06/2016   ALT 17 10/06/2016   PROT 7.0 10/06/2016   ALBUMIN 4.3 10/06/2016   CALCIUM 9.5 10/06/2016   GFR 81.05 10/06/2016   Lab Results  Component Value Date   CHOL 133 03/21/2016   Lab Results  Component Value Date   HDL 67 03/21/2016   Lab Results  Component Value Date   LDLCALC 51 03/21/2016   Lab Results  Component Value Date   TRIG 73 03/21/2016   Lab Results  Component Value Date   CHOLHDL 2.0 03/21/2016   No results found for: HGBA1C     Assessment & Plan:   Problem List Items Addressed This Visit      Unprioritized   Asymptomatic postmenopausal status   Relevant Orders   MM DIGITAL SCREENING BILATERAL   Essential hypertension - Primary   Hyperlipidemia   Relevant Orders   Lipid panel   Hypothyroidism   Preventative health care    See avs ghm utd Check labs  Pt to see RN for medicare wellness       Other Visit Diagnoses    Encounter for Medicare annual wellness exam          I am having Michelle [name] maintain her aspirin, ALIGN, calcium carbonate, multivitamin, ALPHAGAN P, levothyroxine, PARoxetine, loratadine, benzonatate, HYDROcodone-homatropine, levocetirizine, donepezil, esomeprazole, fluticasone,  nortriptyline, valsartan, pravastatin, and AMBULATORY NON FORMULARY MEDICATION.  No orders of the defined types were placed in this encounter.   CMA served as Education administrator during this visit. History, Physical and Plan performed by medical provider. Documentation and orders reviewed and attested to.  Ann Held, DO Patient ID: Michelle [name], female   DOB: 11-Jun-1946, 71 y.o.   MRN: [id number]

## 2016-11-02 NOTE — Therapy (Signed)
[facility] [address] [place], [place], [address] Phone: [phone]   Fax:  [phone]  Physical Therapy Treatment  Patient Details  Name: Michelle [name] MRN: [id number] Date of Birth: April 06, 1946 Referring Provider: Dr. Ellouise Newer  Encounter Date: 10/31/2016     10/31/16 1407  PT Visits / Re-Eval  Visit Number 4  Number of Visits 12  Date for PT Re-Evaluation 12/02/16  Authorization  Authorization Type [employer]  PT Time Calculation  PT Start Time 1402  PT Stop Time 1445  PT Time Calculation (min) 43 min  PT - End of Session  Equipment Utilized During Treatment Gait belt  Activity Tolerance Patient tolerated treatment well  Behavior During Therapy [facility] for tasks assessed/performed    Past Medical History:  Diagnosis Date  . Adenomatous colon polyp   . Allergy   . GERD (gastroesophageal reflux disease)   . Glaucoma   . Hx of hearing loss    bilateral hearing aids  . Hyperlipidemia   . Hypertension   . IBS (irritable bowel syndrome)   . Low back pain   . Thyroid disease     Past Surgical History:  Procedure Laterality Date  . ABDOMINAL HYSTERECTOMY  1998  . CATARACT EXTRACTION, BILATERAL Bilateral 04/2015  . CHOLECYSTECTOMY  1998  . [place] / 2013   skin graft on gums  . TUBAL LIGATION  1977    There were no vitals filed for this visit.     10/31/16 1406  Symptoms/Limitations  Subjective Foot up brace in working well. No falls. Has has some knee pain (usual chronic knee pain). Reports HEP is going well, has not been able to do last few days due to caring for grandkids.   Pertinent History HOH and short term memory impairments-may repeat questions; recent unexplained weight loss, HTN, glaucoma  Limitations Walking;Standing  Diagnostic tests NCV: L common peroneal mononeuropathy  Pain Assessment  Currently in Pain? No/denies      10/31/16 1408  High Level Balance   High Level Balance Activities Tandem walking (tandem/heel walk/toe walk all fwd/bwd)  High Level Balance Comments on red mats next to counter top with intermittent UE support: 3 laps each with min guard to min assist. cues on posture, step length and base of support for improved balance with all except for tandem which narrows base intentionally.   Ankle Exercises: Seated  Other Seated Ankle Exercises seated with left leg on mat with ankle hanging off edge of mat, right foot resting on floor: red theraband assisted DF, resisted PF with emphasis on slow, controlled return into assisted DF, 2 sets of 10 reps                       Ankle Exercises: Standing  Heel Raises 10 reps;Limitations  Heel Raises Limitations with heels hanging off edge of bottom step, light UE support on rails: slowly lowering heels down then slowy raising heels up      10/31/16 1422  Balance Exercises: Standing  SLS with Vectors Foam/compliant surface;Other reps (comment);Limitations  Balance Exercises: Standing  SLS with Vectors Limitations 6 cones along edges of red mats: alternating toe taps to each with side stepping x 1 lap each way, alternating double toe taps to each one with side stepping x 1 lap each way. no UE suport with min guard to min assist for balance. cues on weight shifting and to slow down to assist with balance.  10/31/16 1433  PT Education  Education provided Yes  Education Details added new exercise for ankle strengthening.  Person(s) Educated Patient  Methods Explanation;Demonstration;Verbal cues;Handout  Comprehension Verbalized understanding;Returned demonstration          PT Long Term Goals - 10/21/16 1547      PT LONG TERM GOAL #1   Title indepedent with HEP (12/02/16)   Time 6   Period Weeks   Status New     PT LONG TERM GOAL #2   Title amb > 1000' with AFO on various indoor/outdoor surfaces modified independent for improved mobitliy and function (12/02/16)   Time 6    Period Weeks   Status New     PT LONG TERM GOAL #3   Title demonstrate 3/5 L ankle strength for improved strength and function (12/02/16)   Time 6   Period Weeks   Status New     PT LONG TERM GOAL #4   Title improve gait velocity to > 3.0 ft/sec for improved mobility (12/02/16)   Time 6   Period Weeks   Status New        10/31/16 1408  Plan  Clinical Impression Statement Today's skilled session continued to focus on ankle strengthening and high level balance activities without any issues reported. Pt is making steady progress toward goals and should benefit from continued PT to progress toward unmet goals.   Pt will benefit from skilled therapeutic intervention in order to improve on the following deficits Abnormal gait;Decreased strength;Decreased mobility;Impaired flexibility  Clinical Impairments Affecting Rehab Potential decreased motivation for exercise (per husband)  PT Treatment/Interventions ADLs/Self Care Home Management;Electrical Stimulation;Neuromuscular re-education;Balance training;Therapeutic exercise;Therapeutic activities;Functional mobility training;Stair training;Gait training;Patient/family education;Orthotic Fit/Training;Manual techniques;Passive range of motion;Vestibular  PT Next Visit Plan L ankle strengthening and balance in standing, corner exercises with compliant surface  Consulted and Agree with Plan of Care Patient     Patient will benefit from skilled therapeutic intervention in order to improve the following deficits and impairments:  Abnormal gait, Decreased strength, Decreased mobility, Impaired flexibility  Visit Diagnosis: Foot drop, left  Other abnormalities of gait and mobility     Problem List Patient Active Problem List   Diagnosis Date Noted  . Preventative health care 10/31/2016  . Left foot drop 10/14/2016  . Weight loss 10/14/2016  . Tension-type headache, not intractable 09/17/2016  . Meningioma ([place]) 06/05/2016  . Mild  cognitive impairment 04/02/2016  . Depression 04/02/2016  . Memory loss 03/21/2016  . Asymptomatic postmenopausal status 07/26/2009  . SINUSITIS, ACUTE 01/17/2009  . INTESTINAL GAS 09/18/2008  . ONYCHOMYCOSIS, TOENAILS 08/14/2008  . GLAUCOMA 07/24/2008  . Essential hypertension 07/24/2008  . ACUTE FRONTAL SINUSITIS 03/24/2008  . MYALGIA 02/29/2008  . COUGH 07/26/2007  . ADENOMATOUS COLONIC POLYP 05/20/2007  . DIVERTICULOSIS, COLON 05/20/2007  . Hypothyroidism 04/07/2007  . Hyperlipidemia 04/07/2007  . GERD 04/07/2007  . IBS 04/07/2007    Willow Ora, PTA, [facility] [address], [place], [place] [address] [phone] 11/02/16, 3:10 PM   Name: Michelle [name] MRN: [id number] Date of Birth: [birth year]

## 2016-11-04 ENCOUNTER — Ambulatory Visit: Payer: Medicare HMO | Admitting: Physical Therapy

## 2016-11-04 DIAGNOSIS — M21372 Foot drop, left foot: Secondary | ICD-10-CM | POA: Diagnosis not present

## 2016-11-04 DIAGNOSIS — R2689 Other abnormalities of gait and mobility: Secondary | ICD-10-CM

## 2016-11-04 NOTE — Therapy (Signed)
[facility] [address] [place], [place], [address] Phone: [phone]   Fax:  [phone]  Physical Therapy Treatment  Patient Details  Name: Michelle [name] MRN: [id number] Date of Birth: April 30, 1946 Referring Provider: Dr. Ellouise Newer  Encounter Date: 11/04/2016      PT End of Session - 11/04/16 1611    Visit Number 5   Number of Visits 12   Date for PT Re-Evaluation 12/02/16   Authorization Type Aetna Medicare   PT Start Time [id number]   PT Stop Time 1610   PT Time Calculation (min) 39 min   Activity Tolerance Patient tolerated treatment well   Behavior During Therapy [facility] for tasks assessed/performed      Past Medical History:  Diagnosis Date  . Adenomatous colon polyp   . Allergy   . GERD (gastroesophageal reflux disease)   . Glaucoma   . Hx of hearing loss    bilateral hearing aids  . Hyperlipidemia   . Hypertension   . IBS (irritable bowel syndrome)   . Low back pain   . Thyroid disease     Past Surgical History:  Procedure Laterality Date  . ABDOMINAL HYSTERECTOMY  1998  . CATARACT EXTRACTION, BILATERAL Bilateral 04/2015  . CHOLECYSTECTOMY  1998  . [place] / 2013   skin graft on gums  . TUBAL LIGATION  1977    There were no vitals filed for this visit.      Subjective Assessment - 11/04/16 1532    Subjective doing well, no pain or complaints   Pertinent History HOH and short term memory impairments-may repeat questions; recent unexplained weight loss, HTN, glaucoma   Diagnostic tests NCV: L common peroneal mononeuropathy   Patient Stated Goals improve mobility   Currently in Pain? No/denies                         [facility] Adult PT Treatment/Exercise - 11/04/16 1535      Modalities   Modalities Social worker Location L ant tib   Electrical Stimulation Action NMES 10 sec on/10 sec off x 15 min   Electrical Stimulation Parameters up to 34 mA intensity   Electrical Stimulation Goals Strength;Neuromuscular facilitation     Ankle Exercises: Standing   BAPS Sitting  fitter first; DF/PF, INV/EV, circles; 20 reps   SLS post weight shift on LLE only 5x10 sec; 5x5-10 sec with therapist support   Other Standing Ankle Exercises bil HHA shuffle laterally (toes then heels)     Ankle Exercises: Supine   Other Supine Ankle Exercises with NMES: ankle DF hold 10 sec on / 10 sec off x 15 min                     PT Long Term Goals - 10/21/16 1547      PT LONG TERM GOAL #1   Title indepedent with HEP (12/02/16)   Time 6   Period Weeks   Status New     PT LONG TERM GOAL #2   Title amb > 1000' with AFO on various indoor/outdoor surfaces modified independent for improved mobitliy and function (12/02/16)   Time 6   Period Weeks   Status New     PT LONG TERM GOAL #3   Title demonstrate 3/5 L ankle strength for improved strength and function (12/02/16)   Time 6  Period Weeks   Status New     PT LONG TERM GOAL #4   Title improve gait velocity to > 3.0 ft/sec for improved mobility (12/02/16)   Time 6   Period Weeks   Status New               Plan - 11/04/16 1611    Clinical Impression Statement Pt with good response with NMES today needing min cues and needing to lie supine to decrease substitutions.  Slowly progressing, but continues to have limited active dorsiflexion.  Will continue to benefit from PT to maximize function.   PT Treatment/Interventions ADLs/Self Care Home Management;Electrical Stimulation;Neuromuscular re-education;Balance training;Therapeutic exercise;Therapeutic activities;Functional mobility training;Stair training;Gait training;Patient/family education;Orthotic Fit/Training;Manual techniques;Passive range of motion;Vestibular   PT Next Visit Plan L ankle strengthening and balance in standing, corner exercises with compliant surface; continue NMES    Consulted and Agree with Plan of Care Patient      Patient will benefit from skilled therapeutic intervention in order to improve the following deficits and impairments:  Abnormal gait, Decreased strength, Decreased mobility, Impaired flexibility  Visit Diagnosis: Foot drop, left  Other abnormalities of gait and mobility     Problem List Patient Active Problem List   Diagnosis Date Noted  . Preventative health care 10/31/2016  . Left foot drop 10/14/2016  . Weight loss 10/14/2016  . Tension-type headache, not intractable 09/17/2016  . Meningioma ([place]) 06/05/2016  . Mild cognitive impairment 04/02/2016  . Depression 04/02/2016  . Memory loss 03/21/2016  . Asymptomatic postmenopausal status 07/26/2009  . SINUSITIS, ACUTE 01/17/2009  . INTESTINAL GAS 09/18/2008  . ONYCHOMYCOSIS, TOENAILS 08/14/2008  . GLAUCOMA 07/24/2008  . Essential hypertension 07/24/2008  . ACUTE FRONTAL SINUSITIS 03/24/2008  . MYALGIA 02/29/2008  . COUGH 07/26/2007  . ADENOMATOUS COLONIC POLYP 05/20/2007  . DIVERTICULOSIS, COLON 05/20/2007  . Hypothyroidism 04/07/2007  . Hyperlipidemia 04/07/2007  . GERD 04/07/2007  . IBS 04/07/2007      Laureen Abrahams, PT, DPT 11/04/16 4:13 PM    [facility] [address] [place], [place], [address] Phone: [phone]   Fax:  [phone]  Name: Michelle [name] MRN: [id number] Date of Birth: [birth year]

## 2016-11-07 ENCOUNTER — Encounter: Payer: Self-pay | Admitting: Emergency Medicine

## 2016-11-07 ENCOUNTER — Ambulatory Visit: Payer: Medicare HMO | Attending: Neurology | Admitting: Physical Therapy

## 2016-11-07 ENCOUNTER — Ambulatory Visit (INDEPENDENT_AMBULATORY_CARE_PROVIDER_SITE_OTHER): Payer: Medicare HMO | Admitting: Emergency Medicine

## 2016-11-07 ENCOUNTER — Encounter: Payer: Self-pay | Admitting: Physical Therapy

## 2016-11-07 DIAGNOSIS — M21372 Foot drop, left foot: Secondary | ICD-10-CM | POA: Diagnosis present

## 2016-11-07 DIAGNOSIS — R2689 Other abnormalities of gait and mobility: Secondary | ICD-10-CM | POA: Insufficient documentation

## 2016-11-07 DIAGNOSIS — R05 Cough: Secondary | ICD-10-CM | POA: Diagnosis not present

## 2016-11-07 DIAGNOSIS — R059 Cough, unspecified: Secondary | ICD-10-CM

## 2016-11-07 MED ORDER — HYDROCODONE-HOMATROPINE 5-1.5 MG/5ML PO SYRP: 5.0000 mL | ORAL_SOLUTION | Freq: Four times a day (QID) | ORAL | 0 refills | Status: DC | PRN

## 2016-11-07 NOTE — Patient Instructions (Signed)
Please continue your fluticasone nasal spray, loratadine, chlorpheniramine  Try stopping Nexium. If your cough worsens off of this medication then we will need to restart  Continue to use tessalon as needed for cough We will refill your hycodan to use as needed for cough.  We will not perform a procedure to look at your airways at this time.  Follow with Dr Lamonte Sakai in 6 months or sooner if you have any problems

## 2016-11-07 NOTE — Progress Notes (Signed)
Subjective:    Patient ID: Michelle [name], female    DOB: Feb 07, 1946, 71 y.o.   MRN: [id number]  HPI 71 year old woman, never smoker, history of allergic rhinitis, GERD, hypertension, irritable bowel syndrome, mild cognitive impairment on aricept. She is referred by gastroenterology for evaluation of chronic cough, worse for the last year. Seems to be worst in the am, clears some mucous every morning, clear to white. Can also happen at night when she lays down, during day as well with change in air temperature, sometimes with eating. Denies any aspiration sx. She has allergies, has to blow nose, has congestion. No overt GERD symptoms.   ROV 11/07/16 -- Follow-up visit for patient with a history of GERD, rhinitis, IBS, HTN. Evaluated for chronic cough. We tried treating GERD and rhinitis more aggressively > added fluticasone nasal spray, loratadine, chlorpheniramine. We also started Nexium. Tessalon for cough suppression. CXR was done 10/02/16 that I have personally reviewed and which showed no evidence of abnormality. PFT from 2009 were reviewed and were normal. She is still having some every day cough, is better than our last visit. She has used the [employer] and hycodan almost every day. Her nasal gtt is better but still occurs some.   Review of Systems  Constitutional: Positive for appetite change and unexpected weight change. Negative for fever.  HENT: Negative for congestion, dental problem, ear pain, nosebleeds, postnasal drip, rhinorrhea, sinus pressure, sneezing, sore throat and trouble swallowing.   Eyes: Negative for redness and itching.  Respiratory: Positive for cough. Negative for chest tightness, shortness of breath and wheezing.   Cardiovascular: Negative for palpitations and leg swelling.  Gastrointestinal: Negative for nausea and vomiting.  Genitourinary: Negative for dysuria.  Musculoskeletal: Negative for joint swelling.  Skin: Negative for rash.  Neurological: Positive for  headaches.  Hematological: Does not bruise/bleed easily.  Psychiatric/Behavioral: Negative for dysphoric mood. The patient is not nervous/anxious.    Past Medical History:  Diagnosis Date  . Adenomatous colon polyp   . Allergy   . GERD (gastroesophageal reflux disease)   . Glaucoma   . Hx of hearing loss    bilateral hearing aids  . Hyperlipidemia   . Hypertension   . IBS (irritable bowel syndrome)   . Low back pain   . Thyroid disease      Family History  Problem Relation Age of Onset  . Macular degeneration Father   . Prostate cancer Father   . Hypertension Father   . Cancer Father     prostate  . Glaucoma Mother   . Emphysema Mother   . Hypertension Mother   . Other Brother     Brain tumor  . Cancer Brother 15    brain tumor  . Breast cancer Maternal Grandmother   . Prostate cancer Brother      Social History   Social History  . Marital status: Married    Spouse name: Juanda Crumble, "Butch"  . Number of children: 2  . Years of education: N/A   Occupational History  . retired Unemployed   Social History Main Topics  . Smoking status: Never Smoker  . Smokeless tobacco: Never Used  . Alcohol use No  . Drug use: No  . Sexual activity: Yes    Partners: Male    Birth control/ protection: Surgical   Other Topics Concern  . Not on file   Social History Narrative   Exercise--no     No Known Allergies   Outpatient Medications Prior to Visit  Medication Sig Dispense Refill  . ALPHAGAN P 0.1 % SOLN     . AMBULATORY NON FORMULARY MEDICATION IBgard: Takes prn    . aspirin 81 MG tablet Take 160 mg by mouth daily.    . benzonatate (TESSALON) 200 MG capsule Take 1 capsule (200 mg total) by mouth 3 (three) times daily as needed for cough. 90 capsule 1  . donepezil (ARICEPT) 10 MG tablet Take 1/2 tablet daily for 1 month, then increase to 1 tablet daily 30 tablet 6  . esomeprazole (NEXIUM) 40 MG capsule Take 1 capsule (40 mg total) by mouth daily at 12 noon. 30  capsule 5  . fluticasone (FLONASE) 50 MCG/ACT nasal spray Place 2 sprays into both nostrils daily. 16 g 5  . HYDROcodone-homatropine (HYCODAN) 5-1.5 MG/5ML syrup Take 5 mLs by mouth every 6 (six) hours as needed for cough. 240 mL 0  . levocetirizine (XYZAL) 5 MG tablet Take 1 tablet by mouth daily.  5  . levothyroxine (SYNTHROID, LEVOTHROID) 50 MCG tablet TAKE 1 TABLET (50 MCG TOTAL) BY MOUTH DAILY. 30 tablet 5  . Multiple Vitamin (MULTIVITAMIN) tablet Take 1 tablet by mouth daily.    Marland Kitchen PARoxetine (PAXIL) 10 MG tablet Take 1/2 tablet daily for 1 week, then increase to 1 tablet 30 tablet 6  . valsartan (DIOVAN) 160 MG tablet Take 1 tablet (160 mg total) by mouth daily. 90 tablet 0  . calcium carbonate (OS-CAL) 600 MG TABS Take 600 mg by mouth 2 (two) times daily with a meal.    . loratadine (CLARITIN) 10 MG tablet Take 1 tablet (10 mg total) by mouth daily. (Patient not taking: Reported on 11/07/2016) 30 tablet 5  . nortriptyline (PAMELOR) 10 MG capsule Take 1 capsule at night for 2 weeks, then increase to 2 capsules at night (Patient not taking: Reported on 11/07/2016) 60 capsule 6  . pravastatin (PRAVACHOL) 40 MG tablet Take 1 tablet (40 mg total) by mouth daily. (Patient not taking: Reported on 11/07/2016) 90 tablet 1  . Probiotic Product (ALIGN) 4 MG CAPS Take 1 capsule by mouth daily.     No facility-administered medications prior to visit.         Objective:   Physical Exam Vitals:   11/07/16 1050  BP: 100/60  Pulse: 73  SpO2: 100%  Weight: 122 lb 6.4 oz (55.5 kg)  Height: 5\' 3"  (1.6 m)   Gen: Pleasant, well-nourished, in no distress,  normal affect  ENT: No lesions,  mouth clear,  oropharynx clear, no postnasal drip  Neck: No JVD, no TMG, no carotid bruits  Lungs: No use of accessory muscles, clear without rales or rhonchi  Cardiovascular: RRR, heart sounds normal, no murmur or gallops, no peripheral edema  Musculoskeletal: No deformities, no cyanosis or clubbing  Neuro:  alert, non focal  Skin: Warm, no lesions or rashes      Assessment & Plan:  COUGH Improved but not resolved. Her allergic rhinitis improved as well and I believe there is a direct relationship between this and her cough. Not as clear that she needs to continue treatment for GERD. We will try stopping the Nexium to see if she declines. If so she will restart. Pulmonary function testing from 2009 last visit was normal. I do not believe she needs a bronchoscopy at this time.  Baltazar Apo, MD, PhD 11/07/2016, 11:15 AM [facility] [phone] or if no answer [phone]

## 2016-11-07 NOTE — Assessment & Plan Note (Signed)
Improved but not resolved. Her allergic rhinitis improved as well and I believe there is a direct relationship between this and her cough. Not as clear that she needs to continue treatment for GERD. We will try stopping the Nexium to see if she declines. If so she will restart. Pulmonary function testing from 2009 last visit was normal. I do not believe she needs a bronchoscopy at this time.

## 2016-11-07 NOTE — Patient Instructions (Signed)
    Feet Apart (Compliant Surface) Head Motion - Eyes Open    With eyes open, standing on compliant surface:  feet shoulder width apart 1) stand with eyes closed 30 seconds, 2) move head slowly: up and down. X10; 3) right and left x10; Repeat 1 times per session. Do 1-2 sessions per day.  Copyright  VHI. All rights reserved.    Feet Partial Heel-Toe (Compliant Surface) Head Motion - Eyes Open    With eyes open, standing on compliant surface: right foot partially in front of the other, move head slowly: up and down.x 10, left and right x 10 Repeat 1 times per session. Do 1-2 sessions per day.  Copyright  VHI. All rights reserved.

## 2016-11-07 NOTE — Therapy (Signed)
[facility] [address] [place], [place], [address] Phone: [phone]   Fax:  [phone]  Physical Therapy Treatment  Patient Details  Name: Michelle [name] MRN: [id number] Date of Birth: 01/30/1946 Referring Provider: Dr. Ellouise Newer  Encounter Date: 11/07/2016      PT End of Session - 11/07/16 1013    Visit Number 6   Number of Visits 12   Date for PT Re-Evaluation 12/02/16   Authorization Type Aetna Medicare   PT Start Time [phone]   PT Stop Time 0934   PT Time Calculation (min) 48 min   Activity Tolerance Patient tolerated treatment well   Behavior During Therapy [facility] for tasks assessed/performed      Past Medical History:  Diagnosis Date  . Adenomatous colon polyp   . Allergy   . GERD (gastroesophageal reflux disease)   . Glaucoma   . Hx of hearing loss    bilateral hearing aids  . Hyperlipidemia   . Hypertension   . IBS (irritable bowel syndrome)   . Low back pain   . Thyroid disease     Past Surgical History:  Procedure Laterality Date  . ABDOMINAL HYSTERECTOMY  1998  . CATARACT EXTRACTION, BILATERAL Bilateral 04/2015  . CHOLECYSTECTOMY  1998  . [place] / 2013   skin graft on gums  . TUBAL LIGATION  1977    There were no vitals filed for this visit.      Subjective Assessment - 11/07/16 0853    Subjective Every now and then her toe catches. Reports doing all HEP twice per day most days (sometimes only once).   Pertinent History HOH and short term memory impairments-may repeat questions; recent unexplained weight loss, HTN, glaucoma   Diagnostic tests NCV: L common peroneal mononeuropathy   Patient Stated Goals improve mobility   Currently in Pain? No/denies                         [facility] Adult PT Treatment/Exercise - 11/07/16 0001      Ambulation/Gait   Ambulation/Gait Assistance 7: Independent;4: Min guard   Ambulation Distance (Feet) 110 Feet  50, 50, 50    Assistive device None   Gait Pattern Left steppage   Ambulation Surface Level;Indoor   Gait Comments assessing gait without foot-up pt with catching lt toe after 10 ft with minguard assist due to LOB anteriorly; remainder of gait with foot-up     Ankle Exercises: Seated   Ankle Circles/Pumps AROM;Left;10 reps  heel down   Other Seated Ankle Exercises ankle DF without weight through heel with ~50% less movement             Balance/Ankle Exercises - 11/07/16 1005      Balance/Ankle Exercises: Standing   Standing Eyes Opened Narrow base of support (BOS);Head turns;Foam/compliant surface  10 reps head turns each way   Standing Eyes Closed Wide (BOA);Foam/compliant surface;30 secs  incr sway with approp challenge--did not progress to narrow    Tandem Stance Eyes open;Foam/compliant surface;2 reps;30 secs   SLS Eyes open;Intermittent upper extremity support  on BAPS board; tipping ant-post with control; pt unable r/l   SLS with Vectors Foam/compliant surface;Intermittent upper extremity assist;30 secs;3 reps   Step Ups Forward;6 inch;Intermittent UE support  left foot step up x 10   Other Standing Exercises SLS on left on Bosu in // bars with internittent UE assist x 30 sec x 3  PT Education - 11/07/16 1012    Education provided Yes   Education Details added corner ex's (most EO, one EC) (see pt instructions)   Person(s) Educated Patient   Methods Explanation;Demonstration;Verbal cues;Handout   Comprehension Verbalized understanding;Returned demonstration;Need further instruction             PT Long Term Goals - 10/21/16 1547      PT LONG TERM GOAL #1   Title indepedent with HEP (12/02/16)   Time 6   Period Weeks   Status New     PT LONG TERM GOAL #2   Title amb > 1000' with AFO on various indoor/outdoor surfaces modified independent for improved mobitliy and function (12/02/16)   Time 6   Period Weeks   Status New     PT LONG TERM GOAL #3   Title  demonstrate 3/5 L ankle strength for improved strength and function (12/02/16)   Time 6   Period Weeks   Status New     PT LONG TERM GOAL #4   Title improve gait velocity to > 3.0 ft/sec for improved mobility (12/02/16)   Time 6   Period Weeks   Status New               Plan - 11/07/16 1640    Clinical Impression Statement Patient demonstrated improved AROM lt dorsiflexion. Continue to work on strengthening with compliant surfaces, including adding to HEP corner exercises on foam.    PT Treatment/Interventions ADLs/Self Care Home Management;Electrical Stimulation;Neuromuscular re-education;Balance training;Therapeutic exercise;Therapeutic activities;Functional mobility training;Stair training;Gait training;Patient/family education;Orthotic Fit/Training;Manual techniques;Passive range of motion;Vestibular   PT Next Visit Plan L ankle strengthening and balance in standing, corner exercises with compliant surface (check/progress--given in HEP 3/2); continue NMES   Consulted and Agree with Plan of Care Patient      Patient will benefit from skilled therapeutic intervention in order to improve the following deficits and impairments:  Abnormal gait, Decreased strength, Decreased mobility, Impaired flexibility  Visit Diagnosis: Foot drop, left  Other abnormalities of gait and mobility     Problem List Patient Active Problem List   Diagnosis Date Noted  . Preventative health care 10/31/2016  . Left foot drop 10/14/2016  . Weight loss 10/14/2016  . Tension-type headache, not intractable 09/17/2016  . Meningioma ([place]) 06/05/2016  . Mild cognitive impairment 04/02/2016  . Depression 04/02/2016  . Memory loss 03/21/2016  . Asymptomatic postmenopausal status 07/26/2009  . SINUSITIS, ACUTE 01/17/2009  . INTESTINAL GAS 09/18/2008  . ONYCHOMYCOSIS, TOENAILS 08/14/2008  . GLAUCOMA 07/24/2008  . Essential hypertension 07/24/2008  . ACUTE FRONTAL SINUSITIS 03/24/2008  . MYALGIA  02/29/2008  . COUGH 07/26/2007  . ADENOMATOUS COLONIC POLYP 05/20/2007  . DIVERTICULOSIS, COLON 05/20/2007  . Hypothyroidism 04/07/2007  . Hyperlipidemia 04/07/2007  . GERD 04/07/2007  . IBS 04/07/2007    Rexanne Mano, PT 11/07/2016, 4:44 PM  [facility] [address] [place], [place], [address] Phone: [phone]   Fax:  [phone]  Name: ELZINA HILTNER MRN: [id number] Date of Birth: 1946/04/15

## 2016-11-11 ENCOUNTER — Ambulatory Visit: Payer: Medicare HMO | Admitting: Physical Therapy

## 2016-11-14 ENCOUNTER — Ambulatory Visit: Payer: Medicare HMO | Admitting: Physical Therapy

## 2016-11-14 ENCOUNTER — Encounter: Payer: Self-pay | Admitting: Physical Therapy

## 2016-11-14 DIAGNOSIS — R2689 Other abnormalities of gait and mobility: Secondary | ICD-10-CM

## 2016-11-14 DIAGNOSIS — M21372 Foot drop, left foot: Secondary | ICD-10-CM | POA: Diagnosis not present

## 2016-11-15 ENCOUNTER — Ambulatory Visit (HOSPITAL_BASED_OUTPATIENT_CLINIC_OR_DEPARTMENT_OTHER)
Admission: RE | Admit: 2016-11-15 | Discharge: 2016-11-15 | Disposition: A | Discharge status: Home / Self Care | Payer: Medicare HMO | Source: Ambulatory Visit | Attending: Family Medicine | Admitting: Family Medicine

## 2016-11-15 ENCOUNTER — Encounter (HOSPITAL_BASED_OUTPATIENT_CLINIC_OR_DEPARTMENT_OTHER): Payer: Self-pay

## 2016-11-15 DIAGNOSIS — Z1231 Encounter for screening mammogram for malignant neoplasm of breast: Secondary | ICD-10-CM | POA: Insufficient documentation

## 2016-11-15 DIAGNOSIS — Z780 Asymptomatic menopausal state: Secondary | ICD-10-CM

## 2016-11-16 NOTE — Therapy (Signed)
[facility] [address] [place], [place], [address] Phone: [phone]   Fax:  [phone]  Physical Therapy Treatment  Patient Details  Name: Michelle [name] MRN: [id number] Date of Birth: [birth year] Referring Provider: Dr. Ellouise Newer  Encounter Date: 11/14/2016   11/14/16 1108  PT Visits / Re-Eval  Visit Number 7  Number of Visits 12  Date for PT Re-Evaluation 12/02/16  Authorization  Authorization Type Aetna Medicare  PT Time Calculation  PT Start Time 1105  PT Stop Time 1145  PT Time Calculation (min) 40 min  PT - End of Session  Activity Tolerance Patient tolerated treatment well  Behavior During Therapy [facility] for tasks assessed/performed     Past Medical History:  Diagnosis Date  . Adenomatous colon polyp   . Allergy   . GERD (gastroesophageal reflux disease)   . Glaucoma   . Hx of hearing loss    bilateral hearing aids  . Hyperlipidemia   . Hypertension   . IBS (irritable bowel syndrome)   . Low back pain   . Thyroid disease     Past Surgical History:  Procedure Laterality Date  . ABDOMINAL HYSTERECTOMY  1998  . CATARACT EXTRACTION, BILATERAL Bilateral 04/2015  . CHOLECYSTECTOMY  1998  . [place] / 2013   skin graft on gums  . TUBAL LIGATION  1977    There were no vitals filed for this visit.     11/14/16 1108  Symptoms/Limitations  Subjective No new complaints. No falls or pain to report.   Pertinent History HOH and short term memory impairments-may repeat questions; recent unexplained weight loss, HTN, glaucoma  Limitations Walking;Standing  Diagnostic tests NCV: L common peroneal mononeuropathy  Patient Stated Goals improve mobility  Pain Assessment  Currently in Pain? No/denies      11/14/16 1109  High Level Balance  High Level Balance Activities Tandem walking (tandem/heel/toe walking fwd/bwd)  High Level Balance Comments on red/blue mats with no UE support: 3  laps each with min guard to min assist. cues on posture, step length and base of support for improved balance with all except for tandem which narrows base intentionally.   Mudlogger L Clinical cytogeneticist NMES using EMPI unit preset for large muscle x 15 minutes  Electrical Stimulation Parameters intensity to tolerance  Electrical Stimulation Goals Strength;Neuromuscular facilitation  Ankle Exercises: Seated  BAPS Limitations concurrent with NMES: DF during on times, rest off times x 7-8 minutes  Ankle Circles/Pumps AROM;Strengthening;Left (concurrent with NMES)  Other Seated Ankle Exercises active DF during on times of NMES, rest with off times with foot in support position on stool (heel not in contact) to allow for full motion x 7-8 mintues.  BAPS Sitting;Limitations  Ankle Exercises: Standing  Heel Raises 10 reps;Limitations  Heel Raises Limitations with heels hanging off edge of bottom step, light UE support on rails: slowly lowering heels down then slowy raising heels up         PT Long Term Goals - 10/21/16 1547      PT LONG TERM GOAL #1   Title indepedent with HEP (12/02/16)   Time 6   Period Weeks   Status New     PT LONG TERM GOAL #2   Title amb > 1000' with AFO on various indoor/outdoor surfaces modified independent for improved mobitliy and function (12/02/16)   Time 6   Period Weeks   Status New  PT LONG TERM GOAL #3   Title demonstrate 3/5 L ankle strength for improved strength and function (12/02/16)   Time 6   Period Weeks   Status New     PT LONG TERM GOAL #4   Title improve gait velocity to > 3.0 ft/sec for improved mobility (12/02/16)   Time 6   Period Weeks   Status New        11/14/16 1109  Plan  Clinical Impression Statement Today's skilled session continued to focus on increased left DF strength and ROM. No issues reported. Pt is progressing toward goals and should benefit from  continued PT to progress toward unmet goals .  Pt will benefit from skilled therapeutic intervention in order to improve on the following deficits Abnormal gait;Decreased strength;Decreased mobility;Impaired flexibility  PT Treatment/Interventions ADLs/Self Care Home Management;Electrical Stimulation;Neuromuscular re-education;Balance training;Therapeutic exercise;Therapeutic activities;Functional mobility training;Stair training;Gait training;Patient/family education;Orthotic Fit/Training;Manual techniques;Passive range of motion;Vestibular  PT Next Visit Plan L ankle strengthening and balance in standing, corner exercises with compliant surface; continue NMES  Consulted and Agree with Plan of Care Patient     Patient will benefit from skilled therapeutic intervention in order to improve the following deficits and impairments:  Abnormal gait, Decreased strength, Decreased mobility, Impaired flexibility  Visit Diagnosis: Foot drop, left  Other abnormalities of gait and mobility     Problem List Patient Active Problem List   Diagnosis Date Noted  . Preventative health care 10/31/2016  . Left foot drop 10/14/2016  . Weight loss 10/14/2016  . Tension-type headache, not intractable 09/17/2016  . Meningioma ([place]) 06/05/2016  . Mild cognitive impairment 04/02/2016  . Depression 04/02/2016  . Memory loss 03/21/2016  . Asymptomatic postmenopausal status 07/26/2009  . SINUSITIS, ACUTE 01/17/2009  . INTESTINAL GAS 09/18/2008  . ONYCHOMYCOSIS, TOENAILS 08/14/2008  . GLAUCOMA 07/24/2008  . Essential hypertension 07/24/2008  . ACUTE FRONTAL SINUSITIS 03/24/2008  . MYALGIA 02/29/2008  . COUGH 07/26/2007  . ADENOMATOUS COLONIC POLYP 05/20/2007  . DIVERTICULOSIS, COLON 05/20/2007  . Hypothyroidism 04/07/2007  . Hyperlipidemia 04/07/2007  . GERD 04/07/2007  . IBS 04/07/2007    Michelle [name], PTA, [facility] [address] [place], [place]  [address] [phone] 11/16/16, 8:11 PM  Name: Michelle [name] MRN: [id number] Date of Birth: [birth year]

## 2016-11-19 ENCOUNTER — Ambulatory Visit: Payer: Medicare HMO | Admitting: Physical Therapy

## 2016-11-19 ENCOUNTER — Encounter: Payer: Self-pay | Admitting: Physical Therapy

## 2016-11-19 DIAGNOSIS — M21372 Foot drop, left foot: Secondary | ICD-10-CM | POA: Diagnosis not present

## 2016-11-19 DIAGNOSIS — R2689 Other abnormalities of gait and mobility: Secondary | ICD-10-CM

## 2016-11-19 NOTE — Therapy (Signed)
[facility] [address] [place], [place], [address] Phone: [phone]   Fax:  [phone]  Physical Therapy Treatment  Patient Details  Name: Michelle [name] MRN: [id number] Date of Birth: [birth year] Referring Provider: Dr. Ellouise Newer  Encounter Date: 11/19/2016      PT End of Session - 11/19/16 1615    Visit Number 8   Number of Visits 12   Date for PT Re-Evaluation 15/40/08  but certification end date is 4/13   Authorization Type Aetna Medicare   PT Start Time 1532   PT Stop Time 1614   PT Time Calculation (min) 42 min   Activity Tolerance Patient tolerated treatment well   Behavior During Therapy [facility] for tasks assessed/performed      Past Medical History:  Diagnosis Date  . Adenomatous colon polyp   . Allergy   . GERD (gastroesophageal reflux disease)   . Glaucoma   . Hx of hearing loss    bilateral hearing aids  . Hyperlipidemia   . Hypertension   . IBS (irritable bowel syndrome)   . Low back pain   . Thyroid disease     Past Surgical History:  Procedure Laterality Date  . ABDOMINAL HYSTERECTOMY  1998  . CATARACT EXTRACTION, BILATERAL Bilateral 04/2015  . CHOLECYSTECTOMY  1998  . [place] / 2013   skin graft on gums  . TUBAL LIGATION  1977    There were no vitals filed for this visit.      Subjective Assessment - 11/19/16 1536    Subjective No new complaints; reports foot up brace is helping and she notices a difference when she walks around barefoot.  Feels like the numbness is improving.   Pertinent History HOH and short term memory impairments-may repeat questions; recent unexplained weight loss, HTN, glaucoma   Limitations Walking;Standing   Diagnostic tests NCV: L common peroneal mononeuropathy   Patient Stated Goals improve mobility   Currently in Pain? No/denies           [facility] Adult PT Treatment/Exercise - 11/19/16 1558      Ankle Exercises: Standing   SLS LLE  on rocker board x 10-12 seconds x 2 reps with intermittent UE support   Rocker Board Other (comment)  heel <> toe raises x 12 reps, bilat LE and LLE single leg   Heel Walk (Round Trip) compliant surface x 4 reps   Toe Walk (Round Trip) compliant surface x 4 reps   Other Standing Ankle Exercises UE supported dancer's pose x 4 reps each LE with verbal cues to prevent genu recurvatum   Other Standing Ankle Exercises Standing on multi directional rocker board performing ankle PF, DF, supination, pronation with LLE in clockwise and counterclockwise directions x 5 reps each direction             Balance Exercises - 11/19/16 1614      Balance Exercises: Standing   SLS with Vectors Foam/compliant surface;Other reps (comment)  LLE x 8 reps x 2 sets turning over/up cones with RLE             PT Long Term Goals - 10/21/16 1547      PT LONG TERM GOAL #1   Title indepedent with HEP (12/02/16)   Time 6   Period Weeks   Status New     PT LONG TERM GOAL #2   Title amb > 1000' with AFO on various indoor/outdoor surfaces modified independent for improved mobitliy  and function (12/02/16)   Time 6   Period Weeks   Status New     PT LONG TERM GOAL #3   Title demonstrate 3/5 L ankle strength for improved strength and function (12/02/16)   Time 6   Period Weeks   Status New     PT LONG TERM GOAL #4   Title improve gait velocity to > 3.0 ft/sec for improved mobility (12/02/16)   Time 6   Period Weeks   Status New               Plan - 11/19/16 1616    Clinical Impression Statement Continued to focus on higher level ankle strengthening on more compliant surfaces and dynamic balance.  Due to continued weakness, additional visits added x 2 weeks; will assess LTG the week of 3/27 and decide if pt would benefit from additional visits or is ready to D/C; pt agreeable.     Clinical Impairments Affecting Rehab Potential decreased motivation for exercise (per husband)   PT  Treatment/Interventions ADLs/Self Care Home Management;Electrical Stimulation;Neuromuscular re-education;Balance training;Therapeutic exercise;Therapeutic activities;Functional mobility training;Stair training;Gait training;Patient/family education;Orthotic Fit/Training;Manual techniques;Passive range of motion;Vestibular   PT Next Visit Plan L ankle strengthening and balance in standing, corner exercises with compliant surface (check/progress--given in HEP 3/2); continue NMES   Consulted and Agree with Plan of Care Patient      Patient will benefit from skilled therapeutic intervention in order to improve the following deficits and impairments:  Abnormal gait, Decreased strength, Decreased mobility, Impaired flexibility  Visit Diagnosis: Foot drop, left  Other abnormalities of gait and mobility     Problem List Patient Active Problem List   Diagnosis Date Noted  . Preventative health care 10/31/2016  . Left foot drop 10/14/2016  . Weight loss 10/14/2016  . Tension-type headache, not intractable 09/17/2016  . Meningioma ([place]) 06/05/2016  . Mild cognitive impairment 04/02/2016  . Depression 04/02/2016  . Memory loss 03/21/2016  . Asymptomatic postmenopausal status 07/26/2009  . SINUSITIS, ACUTE 01/17/2009  . INTESTINAL GAS 09/18/2008  . ONYCHOMYCOSIS, TOENAILS 08/14/2008  . GLAUCOMA 07/24/2008  . Essential hypertension 07/24/2008  . ACUTE FRONTAL SINUSITIS 03/24/2008  . MYALGIA 02/29/2008  . COUGH 07/26/2007  . ADENOMATOUS COLONIC POLYP 05/20/2007  . DIVERTICULOSIS, COLON 05/20/2007  . Hypothyroidism 04/07/2007  . Hyperlipidemia 04/07/2007  . GERD 04/07/2007  . IBS 04/07/2007    Raylene Everts, PT, DPT 11/19/16    4:22 PM    [facility] [address] [place], [place], [address] Phone: [phone]   Fax:  [phone]  Name: Michelle [name] MRN: [id number] Date of Birth: 18-Mar-1946

## 2016-11-21 ENCOUNTER — Encounter: Payer: Self-pay | Admitting: Physical Therapy

## 2016-11-21 ENCOUNTER — Ambulatory Visit: Payer: Medicare HMO | Admitting: Physical Therapy

## 2016-11-21 DIAGNOSIS — M21372 Foot drop, left foot: Secondary | ICD-10-CM

## 2016-11-21 DIAGNOSIS — M47812 Spondylosis without myelopathy or radiculopathy, cervical region: Secondary | ICD-10-CM | POA: Diagnosis not present

## 2016-11-21 DIAGNOSIS — M25512 Pain in left shoulder: Secondary | ICD-10-CM | POA: Diagnosis not present

## 2016-11-21 DIAGNOSIS — R2689 Other abnormalities of gait and mobility: Secondary | ICD-10-CM

## 2016-11-22 NOTE — Therapy (Signed)
[facility] [address] [place], [place], [address] Phone: [phone]   Fax:  [phone]  Physical Therapy Treatment  Patient Details  Name: Michelle [name] MRN: [id number] Date of Birth: [birth year] Referring Provider: Dr. Ellouise Newer  Encounter Date: 11/21/2016      PT End of Session - 11/21/16 1406    Visit Number 9   Number of Visits 12   Date for PT Re-Evaluation 69/62/95  but certification end date is 4/13   Authorization Type Aetna Medicare   PT Start Time 1404   PT Stop Time 1445   PT Time Calculation (min) 41 min   Equipment Utilized During Treatment Gait belt   Activity Tolerance Patient tolerated treatment well   Behavior During Therapy [facility] for tasks assessed/performed      Past Medical History:  Diagnosis Date  . Adenomatous colon polyp   . Allergy   . GERD (gastroesophageal reflux disease)   . Glaucoma   . Hx of hearing loss    bilateral hearing aids  . Hyperlipidemia   . Hypertension   . IBS (irritable bowel syndrome)   . Low back pain   . Thyroid disease     Past Surgical History:  Procedure Laterality Date  . ABDOMINAL HYSTERECTOMY  1998  . CATARACT EXTRACTION, BILATERAL Bilateral 04/2015  . CHOLECYSTECTOMY  1998  . [place] / 2013   skin graft on gums  . TUBAL LIGATION  1977    There were no vitals filed for this visit.      Subjective Assessment - 11/21/16 1406    Subjective No new complaints. No falls or pain to report.    Pertinent History HOH and short term memory impairments-may repeat questions; recent unexplained weight loss, HTN, glaucoma   Limitations Walking;Standing   Diagnostic tests NCV: L common peroneal mononeuropathy   Patient Stated Goals improve mobility   Currently in Pain? No/denies           [facility] Adult PT Treatment/Exercise - 11/21/16 1407      High Level Balance   High Level Balance Comments on red mats: toe walking fwd/bwd, in  crouch position toe walking fwd/bwd and heel walking fwd/bwd x 3 laps each. min to mod assist for balance.      Theme park manager L Administrator, sports NMES using empi unit, large muscle preset   Electrical Stimulation Parameters intensity to tolerance   Electrical Stimulation Goals Strength;Neuromuscular facilitation     Ankle Exercises: Seated   Ankle Circles/Pumps AROM;Strengthening;Left  concurrent with NMES   Ankle Circles/Pumps Limitations with left leg propped on stool, heel hanging off edge: active DF with on times, rest with off time x 6-7 minutes total   Other Seated Ankle Exercises using pro-stretch: active DF by pushing down through heel during on  times, rest with off times x 6-7 sec's              Balance Exercises - 11/21/16 1443      Balance Exercises: Standing   Balance Beam using wooden balance beam: side stepping left<>right with heels hanging off edge (just forefoot on beam) x 3 laps each way; side stepping left<>right with toes off edge of beam (just heels on beam) x 3 laps each way; tandem gait fwd with heel taps to floor before placing the foot ahead of the other one on the beam x 6 laps. UE support on counter  for balance with cues on ex form and technique.                                      Other Standing Exercises on inverted BOSU in parallel bars with no UE support to light UE support: rocking fwd/bwd, then laterally with emphasis on tall posture with min guard to min assist for balance. standing statically on BOSU: EC no head movements, progressing to EC head movements left<>right and up<>down.                                      PT Long Term Goals - 10/21/16 1547      PT LONG TERM GOAL #1   Title indepedent with HEP (12/02/16)   Time 6   Period Weeks   Status New     PT LONG TERM GOAL #2   Title amb > 1000' with AFO on various indoor/outdoor surfaces modified independent for improved  mobitliy and function (12/02/16)   Time 6   Period Weeks   Status New     PT LONG TERM GOAL #3   Title demonstrate 3/5 L ankle strength for improved strength and function (12/02/16)   Time 6   Period Weeks   Status New     PT LONG TERM GOAL #4   Title improve gait velocity to > 3.0 ft/sec for improved mobility (12/02/16)   Time 6   Period Weeks   Status New           Plan - 11/21/16 1406    Clinical Impression Statement todays skilled session continued use of NMES for ankle DF strengthening, followed by higher level ankle strengthening/balance activities. No issues reported. Pt continues to have difficulty with active DF. Pt should benefit from continued PT to progress toward unmet goals   Clinical Impairments Affecting Rehab Potential decreased motivation for exercise (per husband)   PT Treatment/Interventions ADLs/Self Care Home Management;Electrical Stimulation;Neuromuscular re-education;Balance training;Therapeutic exercise;Therapeutic activities;Functional mobility training;Stair training;Gait training;Patient/family education;Orthotic Fit/Training;Manual techniques;Passive range of motion;Vestibular   PT Next Visit Plan G-code next visist; L ankle strengthening and balance in standing, corner exercises with compliant surface (check/progress--given in HEP 3/2); continue NMES   Consulted and Agree with Plan of Care Patient      Patient will benefit from skilled therapeutic intervention in order to improve the following deficits and impairments:  Abnormal gait, Decreased strength, Decreased mobility, Impaired flexibility  Visit Diagnosis: Foot drop, left  Other abnormalities of gait and mobility     Problem List Patient Active Problem List   Diagnosis Date Noted  . Preventative health care 10/31/2016  . Left foot drop 10/14/2016  . Weight loss 10/14/2016  . Tension-type headache, not intractable 09/17/2016  . Meningioma ([place]) 06/05/2016  . Mild cognitive impairment  04/02/2016  . Depression 04/02/2016  . Memory loss 03/21/2016  . Asymptomatic postmenopausal status 07/26/2009  . SINUSITIS, ACUTE 01/17/2009  . INTESTINAL GAS 09/18/2008  . ONYCHOMYCOSIS, TOENAILS 08/14/2008  . GLAUCOMA 07/24/2008  . Essential hypertension 07/24/2008  . ACUTE FRONTAL SINUSITIS 03/24/2008  . MYALGIA 02/29/2008  . COUGH 07/26/2007  . ADENOMATOUS COLONIC POLYP 05/20/2007  . DIVERTICULOSIS, COLON 05/20/2007  . Hypothyroidism 04/07/2007  . Hyperlipidemia 04/07/2007  . GERD 04/07/2007  . IBS 04/07/2007    Willow Ora, PTA, [place] [phone]  [address], [place], [place] [address] [phone] 11/22/16, 5:43 PM   Name: Michelle [name] MRN: [id number] Date of Birth: [birth year]

## 2016-11-24 ENCOUNTER — Encounter: Payer: Self-pay | Admitting: Psychology

## 2016-11-24 ENCOUNTER — Ambulatory Visit (INDEPENDENT_AMBULATORY_CARE_PROVIDER_SITE_OTHER): Payer: Medicare HMO | Admitting: Psychology

## 2016-11-24 DIAGNOSIS — R413 Other amnesia: Secondary | ICD-10-CM

## 2016-11-24 NOTE — Progress Notes (Signed)
NEUROPSYCHOLOGICAL INTERVIEW (CPT: [id number])  Name: Michelle [name] Date of Birth: 11/11/1945 Date of Interview: 11/24/2016  Reason for Referral:  Michelle [name] is a 71 y.o. right handed female who is referred for neuropsychological evaluation by Dr. Ellouise Newer of [facility] Neu[facility] due to concerns about memory decline. This patient is accompanied in the office by her husband and two adult daughters who supplement the history.  History of Presenting Problem:  Michelle [name] is followed by Dr. Delice Lesch for memory loss. She was last seen by Dr. Delice Lesch on 10/10/2016. MoCA score in July 2017 was 21/30, consistent with MCI. She is prescribed Aricept. MRI of the brain without contrast on 04/25/2016 revealed incidental finding of 2.7 cm extra-axial mass along dorsal clivus, most likely a meningioma, with mild mass effect on the pons; otherwise unremarkable appearance of the brain for age. Follow up MRI with contrast on 05/30/2016 showed mass effect on the right pons and basilar artery, no invasion of the cavernous sinus or sella.   At today's visit, the patient acknowledges memory decline, and notes that it bothers and scares her. She states she has trouble finding things. She states she worries about getting lost when she is driving. She denies ever having gotten lost, but she admits she has made wrong turns and been uncertain about direction.   Her family first noticed memory decline almost 2 years ago. The patient did not initially notice it. They reported gradual onset with progressive decline, more rapid decline over the past year. She forgets recent conversations and she repeats herself much more frequently. She seems to have more trouble holding her attention during conversation. Sometimes her family is unsure if she is demonstrating reduced attention or reduced comprehension. She does have hearing loss; she wears hearing aids and gets them checked 2-3 times per year. Her daughters noted that on one occasion  recently, they were visiting at her house, and she fell asleep while they were chatting in the same room. Her husband also reports that she has more of a tendency to sit and do nothing, and when he asks her if she is okay, she says "yes, I'm just sitting here".  She is able to manage all basic ADLs, but she is having more difficulty completing complex tasks like paying bills and balancing the checkbook. Her husband recently asked her daughters to start helping with management of the finances. She was also demonstrating confusion with her medications, thinking she needed to stop certain pills, and using multiple pillboxes. Her daughter helped get this organized into AM and PM pillboxes which helped. She independently organizes the pills into the pillboxes weekly and denies any problems doing this. Her other daughter is cleaning her house now because she was not keeping up with it. She reports reduced motivation. She does less cooking and makes simple meals when she does cook. She has her husband do most of the grocery shopping now. 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 Her daughter knows Dealer. The manager says she is doing okay, but Mondays are the hardest for her (after she's had a long weekend). It has helped her to have a structured routine (going to work four days a week).  Her family reports that is demonstrating agitation and irritability which is very out of character for her. She gets very argumentative with her husband. She is more outspoken and has used profanity which she never did  in the past. They also note she is more withdrawn and less interactive in social situations. The patient says it is hard for her to hear and comprehend group conversations.  The patient admits she is feeling down some days and has much less motivation to do things. She denies any sleep difficulty. She has not had any hallucinations. She  has not had any suicidal ideation. Psychiatric history was denied.  The patient has had significant weight lost over the past year (31 lbs total in one year, and she lost 22 of that in 5 mos). She has been to multiple specialists about the unintended weight loss and no etiology has been found. She has a colonoscopy and endoscopy next month. Her bloodwork has reportedly been normal. She does eat meals, but still does not gain weight. She did not like drinking Ensure.  She denies any chronic pain. She has occasional headaches. She developed left foot drop and had NCV with EMG on 10/14/2016, which confirmed injury of the nerve due to compression at the knee. She started physical therapy and was advised to avoid crossing her legs. She had one fall in the past month, when she was not wearing supportive shoes. She was with her daughter and luckily her daughter was able to catch her before she struck the ground.  There is no known family history of dementia. Her brother had a brain tumor and passed away shortly after undergoing surgical resection. This worries the patient and her family given her MRI finding of meningioma.    Social History: Born/Raised: [place] Education: High school Marital history: Married x46 years. Two daughters. Four grandchildren. Alcohol/Tobacco/Substances: No alcohol, never a smoker. No SA.   Medical History: Past Medical History:  Diagnosis Date  . Adenomatous colon polyp   . Allergy   . GERD (gastroesophageal reflux disease)   . Glaucoma   . Hx of hearing loss    bilateral hearing aids  . Hyperlipidemia   . Hypertension   . IBS (irritable bowel syndrome)   . Low back pain   . Thyroid disease      Current Medications:  Outpatient Encounter Prescriptions as of 11/24/2016  Medication Sig  . ALPHAGAN P 0.1 % SOLN   . AMBULATORY NON FORMULARY MEDICATION IBgard: Takes prn  . aspirin 81 MG tablet Take 160 mg by mouth daily.  . benzonatate (TESSALON) 200 MG capsule Take 1  capsule (200 mg total) by mouth 3 (three) times daily as needed for cough.  . calcium carbonate (OS-CAL) 600 MG TABS Take 600 mg by mouth 2 (two) times daily with a meal.  . donepezil (ARICEPT) 10 MG tablet Take 1/2 tablet daily for 1 month, then increase to 1 tablet daily  . esomeprazole (NEXIUM) 40 MG capsule Take 1 capsule (40 mg total) by mouth daily at 12 noon.  . fluticasone (FLONASE) 50 MCG/ACT nasal spray Place 2 sprays into both nostrils daily.  Marland Kitchen HYDROcodone-homatropine (HYCODAN) 5-1.5 MG/5ML syrup Take 5 mLs by mouth every 6 (six) hours as needed for cough.  Marland Kitchen HYDROcodone-homatropine (HYCODAN) 5-1.5 MG/5ML syrup Take 5 mLs by mouth every 6 (six) hours as needed for cough.  . levocetirizine (XYZAL) 5 MG tablet Take 1 tablet by mouth daily.  Marland Kitchen levothyroxine (SYNTHROID, LEVOTHROID) 50 MCG tablet TAKE 1 TABLET (50 MCG TOTAL) BY MOUTH DAILY.  Marland Kitchen loratadine (CLARITIN) 10 MG tablet Take 1 tablet (10 mg total) by mouth daily. (Patient not taking: Reported on 11/07/2016)  . Multiple Vitamin (MULTIVITAMIN) tablet Take 1  tablet by mouth daily.  . nortriptyline (PAMELOR) 10 MG capsule Take 1 capsule at night for 2 weeks, then increase to 2 capsules at night (Patient not taking: Reported on 11/07/2016)  . PARoxetine (PAXIL) 10 MG tablet Take 1/2 tablet daily for 1 week, then increase to 1 tablet  . pravastatin (PRAVACHOL) 40 MG tablet Take 1 tablet (40 mg total) by mouth daily. (Patient not taking: Reported on 11/07/2016)  . Probiotic Product (ALIGN) 4 MG CAPS Take 1 capsule by mouth daily.  . valsartan (DIOVAN) 160 MG tablet Take 1 tablet (160 mg total) by mouth daily.   No facility-administered encounter medications on file as of 11/24/2016.    Her daughter reports she is not taking Paxil Her husband reports she has been off of Nexium for 2 weeks, per doctor's recommendation  Behavioral Observations:   Appearance: Neatly and appropriately dressed and groomed Gait: Ambulated independently, no gross  abnormalities observed Speech: Fluent; normal rate, rhythm and volume Thought process: Linear, goal directed Affect: Blunted Interpersonal: Pleasant, appropriate   TESTING: There is medical necessity to proceed with neuropsychological assessment as the results will be used to aid in differential diagnosis and clinical decision-making and to inform specific treatment recommendations. Per the patient, her family and medical records reviewed, there has been a change in cognitive functioning and a reasonable suspicion of dementia (rule out AD).   PLAN: The patient will return for a full battery of neuropsychological testing with a psychometrician under my supervision. Education regarding testing procedures was provided. Subsequently, the patient will see this provider for a follow-up session at which time her test performances and my impressions and treatment recommendations will be reviewed in detail.   Full neuropsychological evaluation report to follow.

## 2016-11-26 ENCOUNTER — Ambulatory Visit: Payer: Medicare HMO | Admitting: Physical Therapy

## 2016-11-27 ENCOUNTER — Ambulatory Visit: Payer: Medicare HMO | Admitting: Physical Therapy

## 2016-11-27 ENCOUNTER — Encounter: Payer: Self-pay | Admitting: Physical Therapy

## 2016-11-27 DIAGNOSIS — R2689 Other abnormalities of gait and mobility: Secondary | ICD-10-CM

## 2016-11-27 DIAGNOSIS — M21372 Foot drop, left foot: Secondary | ICD-10-CM

## 2016-11-27 NOTE — Therapy (Signed)
[facility] [address] [place], [place], [address] Phone: [phone]   Fax:  [phone]  Physical Therapy Treatment  Patient Details  Name: Michelle [name] MRN: [id number] Date of Birth: Nov 18, 1945 Referring Provider: Dr. Ellouise Newer  Encounter Date: 11/27/2016      PT End of Session - 11/27/16 0937    Visit Number 10   Number of Visits 12   Date for PT Re-Evaluation 35/46/56  but certification end date is 4/13   Authorization Type Aetna Medicare   PT Start Time 0932   PT Stop Time 1015   PT Time Calculation (min) 43 min   Equipment Utilized During Treatment Gait belt   Activity Tolerance Patient tolerated treatment well   Behavior During Therapy [facility] for tasks assessed/performed      Past Medical History:  Diagnosis Date  . Adenomatous colon polyp   . Allergy   . GERD (gastroesophageal reflux disease)   . Glaucoma   . Hx of hearing loss    bilateral hearing aids  . Hyperlipidemia   . Hypertension   . IBS (irritable bowel syndrome)   . Low back pain   . Thyroid disease     Past Surgical History:  Procedure Laterality Date  . ABDOMINAL HYSTERECTOMY  1998  . CATARACT EXTRACTION, BILATERAL Bilateral 04/2015  . CHOLECYSTECTOMY  1998  . [place] / 2013   skin graft on gums  . TUBAL LIGATION  1977    There were no vitals filed for this visit.      Subjective Assessment - 11/27/16 0937    Subjective No new complaints. No falls or pain to report. Feels the foot/ankle are getting better.   Patient is accompained by: Family member   Pertinent History HOH and short term memory impairments-may repeat questions; recent unexplained weight loss, HTN, glaucoma   Limitations Walking;Standing   Diagnostic tests NCV: L common peroneal mononeuropathy   Currently in Pain? No/denies            [facility] Adult PT Treatment/Exercise - 11/27/16 0938      High Level Balance   High Level Balance  Activities Tandem walking  tandem/toe/heel walking fwd/bwd   High Level Balance Comments on blue mat with intermittent touch to parallel bars for balance x 4 laps each with cues on ex form and posture (look up, not at feet)     Acupuncturist Location L ant tib   Electrical Stimulation Action NMES using empi unit for large muscle pre set   Electrical Stimulation Parameters intensity to tolerance   Electrical Stimulation Goals Strength;Neuromuscular facilitation     Ankle Exercises: Seated   Ankle Circles/Pumps AROM;Strengthening;Left   Ankle Circles/Pumps Limitations with left leg propped on stool, heel hanging off edge: active DF with on times, rest with off time x 6-7 minutes total   Other Seated Ankle Exercises using pro-stretch: active DF by pushing down through heel during on  times, rest with off times x 6-7 sec's              Balance Exercises - 11/27/16 1013      Balance Exercises: Standing   Balance Beam using wooden balance beam: side stepping left<>right with heels hanging off edge lifted up in ari (just forefoot on beam) x 3 laps each way; side stepping left<>right with toes off edge of beam up in air (just heels on beam) x 3 laps each way; tandem gait fwd  with heel taps to floor before placing the foot ahead of the other one on the beam x 6 laps. UE support on counter for balance with cues on ex form and technique.                                      Other Standing Exercises on inverted BOSU with no UE support: rocking fwd/bwd and laterally with emphasis on tall posuture. mini squats x 10 reps with cues on form and technique.                        PT Long Term Goals - 10/21/16 1547      PT LONG TERM GOAL #1   Title indepedent with HEP (12/02/16)   Time 6   Period Weeks   Status New     PT LONG TERM GOAL #2   Title amb > 1000' with AFO on various indoor/outdoor surfaces modified independent for improved mobitliy and function  (12/02/16)   Time 6   Period Weeks   Status New     PT LONG TERM GOAL #3   Title demonstrate 3/5 L ankle strength for improved strength and function (12/02/16)   Time 6   Period Weeks   Status New     PT LONG TERM GOAL #4   Title improve gait velocity to > 3.0 ft/sec for improved mobility (12/02/16)   Time 6   Period Weeks   Status New               Plan - December 03, 2016 4656    Clinical Impression Statement today's skilled session continued use of NMES for strengthening and exercises for strengthening/balance. No issues reported. Pt is making steady progress toward goals and should benefit from continued PT to progress toward unmet goals.    Clinical Impairments Affecting Rehab Potential decreased motivation for exercise (per husband)   PT Treatment/Interventions ADLs/Self Care Home Management;Electrical Stimulation;Neuromuscular re-education;Balance training;Therapeutic exercise;Therapeutic activities;Functional mobility training;Stair training;Gait training;Patient/family education;Orthotic Fit/Training;Manual techniques;Passive range of motion;Vestibular   PT Next Visit Plan  L ankle strengthening and balance in standing, corner exercises with compliant surface; continue NMES   Consulted and Agree with Plan of Care Patient      Patient will benefit from skilled therapeutic intervention in order to improve the following deficits and impairments:  Abnormal gait, Decreased strength, Decreased mobility, Impaired flexibility  Visit Diagnosis: Foot drop, left  Other abnormalities of gait and mobility       G-Codes - 2016/12/03 1017    Functional Assessment Tool Used (Outpatient Only) - clinical judgement: left foot drop with left ankle weakness: strength 2+/10, using foot up brace with gait   Functional Limitation Mobility: Walking and moving around   Mobility: Walking and Moving Around Current Status (C1275) At least 20 percent but less than 40 percent impaired, limited or  restricted   Mobility: Walking and Moving Around Goal Status [phone]) At least 1 percent but less than 20 percent impaired, limited or restricted      Problem List Patient Active Problem List   Diagnosis Date Noted  . Preventative health care 10/31/2016  . Left foot drop 10/14/2016  . Weight loss 10/14/2016  . Tension-type headache, not intractable 09/17/2016  . Meningioma ([place]) 06/05/2016  . Mild cognitive impairment 04/02/2016  . Depression 04/02/2016  . Memory loss 03/21/2016  . Asymptomatic postmenopausal status 07/26/2009  .  SINUSITIS, ACUTE 01/17/2009  . INTESTINAL GAS 09/18/2008  . ONYCHOMYCOSIS, TOENAILS 08/14/2008  . GLAUCOMA 07/24/2008  . Essential hypertension 07/24/2008  . ACUTE FRONTAL SINUSITIS 03/24/2008  . MYALGIA 02/29/2008  . COUGH 07/26/2007  . ADENOMATOUS COLONIC POLYP 05/20/2007  . DIVERTICULOSIS, COLON 05/20/2007  . Hypothyroidism 04/07/2007  . Hyperlipidemia 04/07/2007  . GERD 04/07/2007  . IBS 04/07/2007    Willow Ora, PTA, [facility] Neuro [facility] [address], [place], [place] [address] [phone] 11/27/16, 2:24 PM   Name: Michelle [name] MRN: [id number] Date of Birth: [birth year]      Physical Therapy Progress Note  Dates of Reporting Period: 10/21/16 to 11/27/16  Objective Reports of Subjective Statement: see above  Objective Measurements: see above  Goal Update: see above  Plan: plan to continue PT per POC to maximize function.  Pt may need foot up AFO longer than PT episode due to slow return.  Reason Skilled Services are Required: continues to have L foot drop and L ankle weakness affecting balance and safe functional mobility.   Laureen Abrahams, PT, DPT 11/27/16 2:31 PM  [facility] [address] [place],  [address]  [phone] (office) [phone] (fax)

## 2016-11-28 ENCOUNTER — Ambulatory Visit: Payer: Medicare HMO | Admitting: Physical Therapy

## 2016-11-28 ENCOUNTER — Encounter: Payer: Self-pay | Admitting: Physical Therapy

## 2016-11-28 DIAGNOSIS — M21372 Foot drop, left foot: Secondary | ICD-10-CM | POA: Diagnosis not present

## 2016-11-28 DIAGNOSIS — R2689 Other abnormalities of gait and mobility: Secondary | ICD-10-CM

## 2016-11-28 NOTE — Therapy (Signed)
[facility] [address] [place], [place], [address] Phone: [phone]   Fax:  [phone]  Physical Therapy Treatment  Patient Details  Name: Michelle [name] MRN: [id number] Date of Birth: 1946/01/22 Referring Provider: Dr. Ellouise Newer  Encounter Date: 11/28/2016      PT End of Session - 11/28/16 1407    Visit Number 11   Number of Visits 12   Date for PT Re-Evaluation 31/49/70  but certification end date is 4/13   Authorization Type Aetna Medicare   PT Start Time 1403   PT Stop Time 1445   PT Time Calculation (min) 42 min   Equipment Utilized During Treatment Gait belt   Activity Tolerance Patient tolerated treatment well   Behavior During Therapy [facility] for tasks assessed/performed      Past Medical History:  Diagnosis Date  . Adenomatous colon polyp   . Allergy   . GERD (gastroesophageal reflux disease)   . Glaucoma   . Hx of hearing loss    bilateral hearing aids  . Hyperlipidemia   . Hypertension   . IBS (irritable bowel syndrome)   . Low back pain   . Thyroid disease     Past Surgical History:  Procedure Laterality Date  . ABDOMINAL HYSTERECTOMY  1998  . CATARACT EXTRACTION, BILATERAL Bilateral 04/2015  . CHOLECYSTECTOMY  1998  . [place] / 2013   skin graft on gums  . TUBAL LIGATION  1977    There were no vitals filed for this visit.      Subjective Assessment - 11/28/16 1406    Subjective No new complaints. No falls or pain to report. Reports not having as much numbness in foot this week.    Patient is accompained by: Family member   Pertinent History HOH and short term memory impairments-may repeat questions; recent unexplained weight loss, HTN, glaucoma   Limitations Walking;Standing   Diagnostic tests NCV: L common peroneal mononeuropathy   Currently in Pain? No/denies              [facility] Adult PT Treatment/Exercise - 11/28/16 1408      Ambulation/Gait   Ambulation/Gait Yes   Ambulation/Gait Assistance 6: Modified independent (Device/Increase time)   Ambulation/Gait Assistance Details only one episode of toe scuffing noted with outdoor surfaces, no balance loss noted.    Ambulation Distance (Feet) 1000 Feet   Assistive device None   Gait Pattern Step-through pattern;Decreased stride length   Ambulation Surface Level;Unlevel;Indoor;Outdoor;Paved;Gravel;Grass     Neuro Re-ed    Neuro Re-ed Details  inverted BOSU in parallel bars with no UE support: rocking fwd/bwd and laterally with emphasis on tall posture and weight shifting; standing across blue foam beam: alternating forward heel taps to floor and back onto beam x 10 reps each side, alternating backward toe taps to floor and back onto beam x 10 reps, light UE support on bars with min guard to min assist for balance.          Theme park manager L Administrator, sports NMES using empi unit, large muscle preset   Electrical Stimulation Parameters intensity to tolerance   Electrical Stimulation Goals Strength;Neuromuscular facilitation     Ankle Exercises: Seated   Ankle Circles/Pumps AROM;Strengthening;Left   Ankle Circles/Pumps Limitations with left leg propped on stool, heel hanging off edge: active DF with on times, rest with off time x 6-7 minutes total   Other Seated Ankle  Exercises using pro-stretch: active DF by pushing down through heel during on  times, rest with off times x 6-7 sec's             PT Long Term Goals - 10/21/16 1547      PT LONG TERM GOAL #1   Title indepedent with HEP (12/02/16)   Time 6   Period Weeks   Status New     PT LONG TERM GOAL #2   Title amb > 1000' with AFO on various indoor/outdoor surfaces modified independent for improved mobitliy and function (12/02/16)   Time 6   Period Weeks   Status New     PT LONG TERM GOAL #3   Title demonstrate 3/5 L ankle strength for improved strength and  function (12/02/16)   Time 6   Period Weeks   Status New     PT LONG TERM GOAL #4   Title improve gait velocity to > 3.0 ft/sec for improved mobility (12/02/16)   Time 6   Period Weeks   Status New            Plan - 11/28/16 1407    Clinical Impression Statement Today's skilled session continued with use of NMES and strengthening exercises. No issues reported. Pt is making steady progress toward goals and appears on target to meet goals next week (at the end of her plan of care)   Clinical Impairments Affecting Rehab Potential decreased motivation for exercise (per husband)   PT Treatment/Interventions ADLs/Self Care Home Management;Electrical Stimulation;Neuromuscular re-education;Balance training;Therapeutic exercise;Therapeutic activities;Functional mobility training;Stair training;Gait training;Patient/family education;Orthotic Fit/Training;Manual techniques;Passive range of motion;Vestibular   PT Next Visit Plan assess LTGs for anticipated discharge   Consulted and Agree with Plan of Care Patient      Patient will benefit from skilled therapeutic intervention in order to improve the following deficits and impairments:  Abnormal gait, Decreased strength, Decreased mobility, Impaired flexibility  Visit Diagnosis: Foot drop, left  Other abnormalities of gait and mobility     Problem List Patient Active Problem List   Diagnosis Date Noted  . Preventative health care 10/31/2016  . Left foot drop 10/14/2016  . Weight loss 10/14/2016  . Tension-type headache, not intractable 09/17/2016  . Meningioma ([place]) 06/05/2016  . Mild cognitive impairment 04/02/2016  . Depression 04/02/2016  . Memory loss 03/21/2016  . Asymptomatic postmenopausal status 07/26/2009  . SINUSITIS, ACUTE 01/17/2009  . INTESTINAL GAS 09/18/2008  . ONYCHOMYCOSIS, TOENAILS 08/14/2008  . GLAUCOMA 07/24/2008  . Essential hypertension 07/24/2008  . ACUTE FRONTAL SINUSITIS 03/24/2008  . MYALGIA 02/29/2008   . COUGH 07/26/2007  . ADENOMATOUS COLONIC POLYP 05/20/2007  . DIVERTICULOSIS, COLON 05/20/2007  . Hypothyroidism 04/07/2007  . Hyperlipidemia 04/07/2007  . GERD 04/07/2007  . IBS 04/07/2007    Willow Ora, PTA, [facility] Outpatient Neuro [facility] [address] [place], [place] [address] [phone] 11/28/16, 7:27 PM   Name: Michelle [name] MRN: [id number] Date of Birth: Nov 11, 1945

## 2016-12-02 ENCOUNTER — Encounter: Payer: Self-pay | Admitting: Internal Medicine

## 2016-12-04 ENCOUNTER — Ambulatory Visit (INDEPENDENT_AMBULATORY_CARE_PROVIDER_SITE_OTHER): Payer: Medicare HMO | Admitting: Psychology

## 2016-12-04 DIAGNOSIS — R413 Other amnesia: Secondary | ICD-10-CM | POA: Diagnosis not present

## 2016-12-04 NOTE — Progress Notes (Signed)
   Neuropsychology Note  Michelle [name] returned today for 2 hours of neuropsychological testing with technician, Milana Kidney, BS, under the supervision of Dr. Macarthur Critchley. The patient did not appear overtly distressed by the testing session, per behavioral observation or via self-report to the technician. Rest breaks were offered. Michelle [name] will return within 2 weeks for a feedback session with Dr. Si Raider at which time her test performances, clinical impressions and treatment recommendations will be reviewed in detail. The patient understands she can contact our office should she require our assistance before this time.  Full report to follow.

## 2016-12-09 ENCOUNTER — Ambulatory Visit: Payer: Medicare HMO | Attending: Neurology | Admitting: Physical Therapy

## 2016-12-09 ENCOUNTER — Encounter: Payer: Self-pay | Admitting: Physical Therapy

## 2016-12-09 ENCOUNTER — Ambulatory Visit: Payer: Medicare HMO | Admitting: Physical Therapy

## 2016-12-09 DIAGNOSIS — M21372 Foot drop, left foot: Secondary | ICD-10-CM | POA: Insufficient documentation

## 2016-12-09 DIAGNOSIS — R2689 Other abnormalities of gait and mobility: Secondary | ICD-10-CM

## 2016-12-09 NOTE — Therapy (Signed)
[facility] [address] [place], [place], [address] Phone: [phone]   Fax:  [phone]  Physical Therapy Treatment  Patient Details  Name: Michelle [name] MRN: [id number] Date of Birth: Oct 18, 1945 Referring Provider: Dr. Patrcia Dolly  Encounter Date: 12/09/2016      PT End of Session - 12/09/16 0850    Visit Number 12   Number of Visits 12   Date for PT Re-Evaluation 12/02/16  today 12/09/2016   Authorization Type [employer]   PT Start Time 0805   PT Stop Time 0848   PT Time Calculation (min) 43 min   Activity Tolerance Patient tolerated treatment well   Behavior During Therapy [facility] for tasks assessed/performed      Past Medical History:  Diagnosis Date  . Adenomatous colon polyp   . Allergy   . GERD (gastroesophageal reflux disease)   . Glaucoma   . Hx of hearing loss    bilateral hearing aids  . Hyperlipidemia   . Hypertension   . IBS (irritable bowel syndrome)   . Low back pain   . Thyroid disease     Past Surgical History:  Procedure Laterality Date  . ABDOMINAL HYSTERECTOMY  1998  . CATARACT EXTRACTION, BILATERAL Bilateral 04/2015  . CHOLECYSTECTOMY  1998  . GUM SURGERY  1996 / 2013   skin graft on gums  . TUBAL LIGATION  1977    There were no vitals filed for this visit.      Subjective Assessment - 12/09/16 0812    Subjective Does report one fall at daughter's house but was sitting on tall chair and foot slid off and pt fell forwards-feels it was not due to foot drop.  Still having some numbness in toes but feels strength is returning.   Pertinent History HOH and short term memory impairments-may repeat questions; recent unexplained weight loss, HTN, glaucoma   Limitations Walking;Standing   Diagnostic tests NCV: L common peroneal mononeuropathy   Patient Stated Goals improve mobility   Currently in Pain? No/denies            [facility] PT Assessment - 12/09/16 0815      Sensation   Light Touch Impaired by gross assessment  intermittent numbness in L toes     ROM / Strength   AROM / PROM / Strength Strength     Strength   Right/Left Ankle Right;Left   Right Ankle Dorsiflexion 5/5   Right Ankle Plantar Flexion 5/5   Right Ankle Inversion 5/5   Right Ankle Eversion 5/5   Left Ankle Dorsiflexion 3/5   Left Ankle Plantar Flexion 5/5   Left Ankle Inversion 3/5   Left Ankle Eversion 3/5     Ambulation/Gait   Gait velocity 10.75 seconds or 3.05 ft/sec           [facility] Adult PT Treatment/Exercise - 12/09/16 0815      Ankle Exercises: Standing   SLS 10 seconds each LE   Heel Raises 10 reps   Toe Raise 10 reps   Heel Walk (Round Trip) 2 reps, one UE support   Toe Walk (Round Trip) 2 reps, one UE support   Other Standing Ankle Exercises Tandem stance x 10 seconds each LE             Balance Exercises - 12/09/16 0842      Balance Exercises: Standing   Standing Eyes Opened Narrow base of support (BOS);Head turns;Foam/compliant surface  feet together, partial tandem, 10  reps head turns           PT Education - 12/18/16 0850    Education provided Yes   Education Details reviewed HEP and plan for D/C, recommendations to continue to wear orthosis   Person(s) Educated Patient   Methods Explanation;Demonstration   Comprehension Verbalized understanding;Returned demonstration             PT Long Term Goals - 12-18-2016 0827      PT LONG TERM GOAL #1   Title indepedent with HEP (12/02/16)   Baseline Pt with short term memory loss-requires cues for correct technique   Status Partially Met     PT LONG TERM GOAL #2   Title amb > 1000' with AFO on various indoor/outdoor surfaces modified independent for improved mobitliy and function (12/02/16)   Status Achieved     PT LONG TERM GOAL #3   Title demonstrate 3/5 L ankle strength for improved strength and function (12/02/16)   Status Achieved     PT LONG TERM GOAL #4   Title improve  gait velocity to > 3.0 ft/sec for improved mobility (12/02/16)   Status Achieved               Plan - 12/18/2016 0851    Clinical Impression Statement Today's treatment session with focus on re-assessment of LE strength, sensation, gait, balance and pt independence with HEP.  Pt strength has improved to 3/5 overall in L ankle and pt gait velocity has improved to >3 ft/sec.  Pt is also able to ambulate safely over indoor and outdoor surfaces with foot up brace (DF assist).  Pt continues to require verbal cues for correct technique with HEP due to short term memory loss.  Pt educated to continue exercises and continue wear of AFO until strength and ROM of L ankle returns to baseline.  Pt verbalized agreement.  Pt safe for D/C from PT.   Rehab Potential Excellent   Clinical Impairments Affecting Rehab Potential decreased motivation for exercise (per husband)   PT Treatment/Interventions ADLs/Self Care Home Management;Electrical Stimulation;Neuromuscular re-education;Balance training;Therapeutic exercise;Therapeutic activities;Functional mobility training;Stair training;Gait training;Patient/family education;Orthotic Fit/Training;Manual techniques;Passive range of motion;Vestibular   PT Next Visit Plan D/C today   Consulted and Agree with Plan of Care Patient      Patient will benefit from skilled therapeutic intervention in order to improve the following deficits and impairments:  Abnormal gait, Decreased strength, Decreased mobility, Impaired flexibility  Visit Diagnosis: Foot drop, left  Other abnormalities of gait and mobility       G-Codes - 12-18-2016 0858    Functional Assessment Tool Used (Outpatient Only) gait velocity >13f/sec; LE strength 3/5   Functional Limitation Mobility: Walking and moving around   Mobility: Walking and Moving Around Goal Status [phone] At least 1 percent but less than 20 percent impaired, limited or restricted   Mobility: Walking and Moving Around  Discharge Status [phone] At least 1 percent but less than 20 percent impaired, limited or restricted      Problem List Patient Active Problem List   Diagnosis Date Noted  . Preventative health care 10/31/2016  . Left foot drop 10/14/2016  . Weight loss 10/14/2016  . Tension-type headache, not intractable 09/17/2016  . M[place]oma [place] 06/05/2016  . Mild cognitive impairment 04/02/2016  . Depression 04/02/2016  . Memory loss 03/21/2016  . Asymptomatic postmenopausal status 07/26/2009  . SINUSITIS, ACUTE 01/17/2009  . INTESTINAL GAS 09/18/2008  . ONYCHOMYCOSIS, TOENAILS 08/14/2008  . GLAUCOMA 07/24/2008  . Essential hypertension  07/24/2008  . ACUTE FRONTAL SINUSITIS 03/24/2008  . MYALGIA 02/29/2008  . COUGH 07/26/2007  . ADENOMATOUS COLONIC POLYP 05/20/2007  . DIVERTICULOSIS, COLON 05/20/2007  . Hypothyroidism 04/07/2007  . Hyperlipidemia 04/07/2007  . GERD 04/07/2007  . IBS 04/07/2007   PHYSICAL THERAPY DISCHARGE SUMMARY  Visits from Start of Care: 12  Current functional level related to goals / functional outcomes: See above   Remaining deficits: L ankle weakness and foot drop, impaired dynamic balance, gait   Education / Equipment: HEP, L foot up brace  Plan: Patient agrees to discharge.  Patient goals were met. Patient is being discharged due to meeting the stated rehab goals.  ?????    Raylene Everts, PT, DPT 12/09/16    9:01 AM   [facility] [address] [place], [place], [address] Phone: [phone]   Fax:  [phone]  Name: MAYMUNAH STEGEMANN MRN: [id number] Date of Birth: 1945-11-19

## 2016-12-10 ENCOUNTER — Encounter: Payer: Self-pay | Admitting: Internal Medicine

## 2016-12-10 ENCOUNTER — Ambulatory Visit (AMBULATORY_SURGERY_CENTER): Payer: Medicare HMO | Admitting: Internal Medicine

## 2016-12-10 VITALS — BP 105/48 | HR 76 | Temp 98.0°F | Resp 19 | Ht 63.0 in | Wt 123.0 lb

## 2016-12-10 DIAGNOSIS — R634 Abnormal weight loss: Secondary | ICD-10-CM

## 2016-12-10 DIAGNOSIS — Z86010 Personal history of colonic polyps: Secondary | ICD-10-CM

## 2016-12-10 DIAGNOSIS — Z1211 Encounter for screening for malignant neoplasm of colon: Secondary | ICD-10-CM | POA: Diagnosis not present

## 2016-12-10 DIAGNOSIS — D123 Benign neoplasm of transverse colon: Secondary | ICD-10-CM | POA: Diagnosis not present

## 2016-12-10 DIAGNOSIS — D122 Benign neoplasm of ascending colon: Secondary | ICD-10-CM | POA: Diagnosis not present

## 2016-12-10 DIAGNOSIS — R1084 Generalized abdominal pain: Secondary | ICD-10-CM

## 2016-12-10 HISTORY — PX: COLONOSCOPY: SHX174

## 2016-12-10 MED ORDER — SODIUM CHLORIDE 0.9 % IV SOLN: 500.0000 mL | INTRAVENOUS | Status: DC

## 2016-12-10 NOTE — Progress Notes (Signed)
A and O x3. Report to RN. Tolerated MAC anesthesia well.Teeth unchanged after procedure.

## 2016-12-10 NOTE — Progress Notes (Signed)
Called to room to assist during endoscopic procedure.  Patient ID and intended procedure confirmed with present staff. Received instructions for my participation in the procedure from the performing physician.  

## 2016-12-10 NOTE — Op Note (Signed)
[place] Patient Name: Michelle [name] Procedure Date: 12/10/2016 1:07 PM MRN: [id number] Endoscopist: Docia Chuck. Henrene Pastor , MD Age: 71 Referring MD:  Date of Birth: 01-07-1946 Gender: Female Account #: [id number] Procedure:                Upper GI endoscopy Indications:              Weight loss Medicines:                Monitored Anesthesia Care Procedure:                Pre-Anesthesia Assessment:                           - Prior to the procedure, a History and Physical                            was performed, and patient medications and                            allergies were reviewed. The patient's tolerance of                            previous anesthesia was also reviewed. The risks                            and benefits of the procedure and the sedation                            options and risks were discussed with the patient.                            All questions were answered, and informed consent                            was obtained. Prior Anticoagulants: The patient has                            taken no previous anticoagulant or antiplatelet                            agents. ASA Grade Assessment: II - A patient with                            mild systemic disease. After reviewing the risks                            and benefits, the patient was deemed in                            satisfactory condition to undergo the procedure.                           After obtaining informed consent, the endoscope was  passed under direct vision. Throughout the                            procedure, the patient's blood pressure, pulse, and                            oxygen saturations were monitored continuously. The                            Endoscope was introduced through the mouth, and                            advanced to the second part of duodenum. The upper                            GI endoscopy was accomplished without  difficulty.                            The patient tolerated the procedure well. Scope In: Scope Out: Findings:                 The esophagus was normal.                           The stomach was normal.                           The examined duodenum was normal.                           The cardia and gastric fundus were normal on                            retroflexion. Complications:            No immediate complications. Estimated Blood Loss:     Estimated blood loss: none. Impression:               - Normal EGD                           - Suspect weight loss related to decreased                            appetite. Possibly medication related. Recommendation:           - Patient has a contact number available for                            emergencies. The signs and symptoms of potential                            delayed complications were discussed with the                            patient. Return to normal activities tomorrow.  Written discharge instructions were provided to the                            patient.                           - Resume previous diet.                           - Continue present medications.                           - Return to [facility] primary provider Docia Chuck. Henrene Pastor, MD 12/10/2016 2:05:27 PM This report has been signed electronically.

## 2016-12-10 NOTE — Patient Instructions (Signed)
YOU HAD AN ENDOSCOPIC PROCEDURE TODAY AT [facility]:   Refer to the procedure report that was given to you for any specific questions about what was found during the examination.  If the procedure report does not answer your questions, please call your gastroenterologist to clarify.  If you requested that your care partner not be given the details of your procedure findings, then the procedure report has been included in a sealed envelope for you to review at your convenience later.  YOU SHOULD EXPECT: Some feelings of bloating in the abdomen. Passage of more gas than usual.  Walking can help get rid of the air that was put into your GI tract during the procedure and reduce the bloating. If you had a lower endoscopy (such as a colonoscopy or flexible sigmoidoscopy) you may notice spotting of blood in your stool or on the toilet paper. If you underwent a bowel prep for your procedure, you may not have a normal bowel movement for a few days.  Please Note:  You might notice some irritation and congestion in your nose or some drainage.  This is from the oxygen used during your procedure.  There is no need for concern and it should clear up in a day or so.  SYMPTOMS TO REPORT IMMEDIATELY:   Following lower endoscopy (colonoscopy or flexible sigmoidoscopy):  Excessive amounts of blood in the stool  Significant tenderness or worsening of abdominal pains  Swelling of the abdomen that is new, acute  Fever of 100F or higher   Following upper endoscopy (EGD)  Vomiting of blood or coffee ground material  New chest pain or pain under the shoulder blades  Painful or persistently difficult swallowing  New shortness of breath  Fever of 100F or higher  Black, tarry-looking stools  For urgent or emergent issues, a gastroenterologist can be reached at any hour by calling [phone].   DIET:  We do recommend a small meal at first, but then you may proceed to your regular diet.  Drink  plenty of fluids but you should avoid alcoholic beverages for 24 hours.  ACTIVITY:  You should plan to take it easy for the rest of today and you should NOT DRIVE or use heavy machinery until tomorrow (because of the sedation medicines used during the test).    FOLLOW UP: Our staff will call the number listed on your records the next business day following your procedure to check on you and address any questions or concerns that you may have regarding the information given to you following your procedure. If we do not reach you, we will leave a message.  However, if you are feeling well and you are not experiencing any problems, there is no need to return our call.  We will assume that you have returned to your regular daily activities without incident.  If any biopsies were taken you will be contacted by phone or by letter within the next 1-3 weeks.  Please call us at [phone] if you have not heard about the biopsies in 3 weeks.    SIGNATURES/CONFIDENTIALITY: You and/or your care partner have signed paperwork which will be entered into your electronic medical record.  These signatures attest to the fact that that the information above on your After Visit Summary has been reviewed and is understood.  Full responsibility of the confidentiality of this discharge information lies with you and/or your care-partner.  Polyps, diverticulosis, and high fiber diet information.

## 2016-12-10 NOTE — Op Note (Signed)
[place] Patient Name: Michelle [name] Procedure Date: 12/10/2016 1:07 PM MRN: [id number] Endoscopist: Docia Chuck. Henrene Pastor , MD Age: 71 Referring MD:  Date of Birth: 07/17/1946 Gender: Female Account #: [id number] Procedure:                Colonoscopy, with cold snare polypectomy X3 Indications:              High risk colon cancer surveillance: Personal                            history of multiple (3 or more) adenomas. Previous                            examinations 2005, 2008, and 2014. Seen recently in                            the office for unexplained weight loss associated                            with decreased appetite Medicines:                Monitored Anesthesia Care Procedure:                Pre-Anesthesia Assessment:                           - Prior to the procedure, a History and Physical                            was performed, and patient medications and                            allergies were reviewed. The patient's tolerance of                            previous anesthesia was also reviewed. The risks                            and benefits of the procedure and the sedation                            options and risks were discussed with the patient.                            All questions were answered, and informed consent                            was obtained. Prior Anticoagulants: The patient has                            taken no previous anticoagulant or antiplatelet                            agents. ASA Grade Assessment: II - A patient with  mild systemic disease. After reviewing the risks                            and benefits, the patient was deemed in                            satisfactory condition to undergo the procedure.                           After obtaining informed consent, the colonoscope                            was passed under direct vision. Throughout the                            procedure,  the patient's blood pressure, pulse, and                            oxygen saturations were monitored continuously. The                            Colonoscope was introduced through the anus and                            advanced to the the cecum, identified by                            appendiceal orifice and ileocecal valve. The                            ileocecal valve, appendiceal orifice, and rectum                            were photographed. The quality of the bowel                            preparation was excellent. The colonoscopy was                            performed without difficulty. The patient tolerated                            the procedure well. The bowel preparation used was                            SUPREP. Scope In: 1:33:30 PM Scope Out: 1:52:03 PM Scope Withdrawal Time: 0 hours 12 minutes 39 seconds  Total Procedure Duration: 0 hours 18 minutes 33 seconds  Findings:                 Three polyps were found in the transverse colon and                            ascending colon. The polyps were 3 to 5 mm in size.  These polyps were removed with a cold snare.                            Resection and retrieval were complete.                           Multiple diverticula were found in the left colon.                           The exam was otherwise without abnormality on                            direct and retroflexion views. Complications:            No immediate complications. Estimated blood loss:                            None. Estimated Blood Loss:     Estimated blood loss: none. Impression:               - Three 3 to 5 mm polyps in the transverse colon                            and in the ascending colon, removed with a cold                            snare. Resected and retrieved.                           - Diverticulosis in the left colon.                           - The examination was otherwise normal on direct                             and retroflexion views. Recommendation:           - Repeat colonoscopy in 3 - 5 years for                            surveillance.                           - Patient has a contact number available for                            emergencies. The signs and symptoms of potential                            delayed complications were discussed with the                            patient. Return to normal activities tomorrow.                            Written discharge instructions were provided to  the                            patient.                           - Resume previous diet.                           - Continue present medications.                           - Await pathology results. Docia Chuck. Henrene Pastor, MD 12/10/2016 2:03:23 PM This report has been signed electronically.

## 2016-12-11 ENCOUNTER — Telehealth: Payer: Self-pay | Admitting: *Deleted

## 2016-12-11 NOTE — Telephone Encounter (Signed)
No message left no voicemail.

## 2016-12-11 NOTE — Telephone Encounter (Signed)
  Follow up Call-  Call back number 12/10/2016  Post procedure Call Back phone  # [phone]  Permission to leave phone message Yes  Some recent data might be hidden     Patient questions:  Do you have a fever, pain , or abdominal swelling? No. Pain Score  0 *  Have you tolerated food without any problems? Yes.    Have you been able to return to your normal activities? Yes.    Do you have any questions about your discharge instructions: Diet   No. Medications  No. Follow up visit  No.  Do you have questions or concerns about your Care? No.  Actions: * If pain score is 4 or above: No action needed, pain <4.

## 2016-12-15 ENCOUNTER — Ambulatory Visit (INDEPENDENT_AMBULATORY_CARE_PROVIDER_SITE_OTHER): Payer: Medicare HMO | Admitting: Neurology

## 2016-12-15 ENCOUNTER — Encounter: Payer: Self-pay | Admitting: Neurology

## 2016-12-15 VITALS — BP 128/60 | HR 76 | Temp 98.1°F | Resp 16 | Ht 62.5 in | Wt 121.4 lb

## 2016-12-15 DIAGNOSIS — G5732 Lesion of lateral popliteal nerve, left lower limb: Secondary | ICD-10-CM

## 2016-12-15 DIAGNOSIS — G3184 Mild cognitive impairment, so stated: Secondary | ICD-10-CM | POA: Diagnosis not present

## 2016-12-15 MED ORDER — DONEPEZIL HCL 10 MG PO TABS: ORAL_TABLET | ORAL | 3 refills | Status: DC

## 2016-12-15 NOTE — Progress Notes (Signed)
NEUROLOGY FOLLOW UP OFFICE NOTE  ATARA PATERSON [id number]  HISTORY OF PRESENT ILLNESS: I had the pleasure of seeing Raffaela Ladley in follow-up in the neurology clinic on 12/15/2016. She is again accompanied by her daughter who helps supplement the history today. The patient was last seen 2 months ago, she presented urgently for a left foot drop. I had been seeing her for worsening memory. 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 She underwent PT, last note on 12/09/16 indicated she felt strength was returning, she had 3/5 ankle dorsiflexion, inversion and eversion. HEP was given with recommendation to wear orthosis until strength and ROM return to baseline. She feels her strength is better, she had numbness on the top of her foot, which is better as well. She was noted to require verbal cues for correct technique with HEP due to short term memory loss. She underwent Neuropsychological testing, results pending at this time. She continues to take Aricept 10mg  daily without side effects. Her daughter reports memory has been the same, she has good and "not so good" days. She continues to drive without getting lost. She denies any further headaches. She had one fall off a bar stool and hit her head, no loss of consciousness.   HPI 04/02/16: This is a pleasant 71 yo RH woman with a history of hypertension, hyperlipidemia, hypothyroidism, who presented for evaluation of memory loss. Her daughter started noticing memory changes a year ago. The patient states she can tell a difference in her memory, but that her family notices it more and "they may think I'm in denial." Her family has noticed worsening over the past 6 months, she repeats herself several times, sometimes having the same conversation four times within an hour. Her husband reports that she asked him repeatedly last  24th of July weekend what he was wearing to go home. She misplaces things frequently in the house. She left the stove on one time boiling water. She takes longer to get ready to go out. She denies getting lost driving, no missed bills or medications. 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 Her family also expressed concern about personality changes, she used to be laidback but now her patience is not there. She is cussing a lot more easily now. She doesn't want to do things she used to like. She does not like going out, and stayed in the room while at the beach more than with her family. No paranoia. No difficulties with ADLs. There is no family history of dementia, she denies any history of head injuries, alcohol intake. She has been having dull right frontal headaches for the past 6 months attributed to her sinuses, no associated nausea/vomiting. She denies any dizziness, diplopia, dysarthria/dysphagia, focal numbness/tingling/weakness, bowel/bladder dysfunction. She has occasional neck and left shoulder pain. No falls.   She had an MMSE done with her PCP this month, MMSE 25/30. TSH and B12 unremarkable. She has been unable to do the MRI brain.  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 Post-contrast MRI was ordered, which again showed the 55mm enhancing dural-based mass posterior to the clivus on the right. There is mass effect on the anterior  pons on the right which is mildly compressed. The mass is touching the basilar causing mild displacement to the left. There is no definite invasion of the sella. It may be touching the cisternal segment of the trigeminal nerve.   PAST MEDICAL HISTORY: Past Medical History:  Diagnosis Date  . Adenomatous colon polyp   . Allergy   . GERD (gastroesophageal reflux  disease)   . Glaucoma   . Hx of hearing loss    bilateral hearing aids  . Hyperlipidemia   . Hypertension   . IBS (irritable bowel syndrome)   . Low back pain   . Thyroid disease     MEDICATIONS: Current Outpatient Prescriptions on File Prior to Visit  Medication Sig Dispense Refill  . ALPHAGAN P 0.1 % SOLN     . AMBULATORY NON FORMULARY MEDICATION IBgard: Takes prn    . aspirin 81 MG tablet Take 160 mg by mouth daily.    . benzonatate (TESSALON) 200 MG capsule Take 1 capsule (200 mg total) by mouth 3 (three) times daily as needed for cough. 90 capsule 1  . calcium carbonate (OS-CAL) 600 MG TABS Take 600 mg by mouth 2 (two) times daily with a meal.    . donepezil (ARICEPT) 10 MG tablet Take 1/2 tablet daily for 1 month, then increase to 1 tablet daily 30 tablet 6  . esomeprazole (NEXIUM) 40 MG capsule Take 1 capsule (40 mg total) by mouth daily at 12 noon. 30 capsule 5  . fluticasone (FLONASE) 50 MCG/ACT nasal spray Place 2 sprays into both nostrils daily. (Patient not taking: Reported on 12/10/2016) 16 g 5  . HYDROcodone-homatropine (HYCODAN) 5-1.5 MG/5ML syrup Take 5 mLs by mouth every 6 (six) hours as needed for cough. 240 mL 0  . HYDROcodone-homatropine (HYCODAN) 5-1.5 MG/5ML syrup Take 5 mLs by mouth every 6 (six) hours as needed for cough. 240 mL 0  . levocetirizine (XYZAL) 5 MG tablet Take 1 tablet by mouth daily.  5  . levothyroxine (SYNTHROID, LEVOTHROID) 50 MCG tablet TAKE 1 TABLET (50 MCG TOTAL) BY MOUTH DAILY. 30 tablet 5  . loratadine (CLARITIN) 10 MG tablet Take 1 tablet (10 mg total) by mouth daily. 30 tablet 5  . Multiple Vitamin (MULTIVITAMIN) tablet Take 1 tablet by mouth daily.    . nortriptyline (PAMELOR) 10 MG capsule Take 1 capsule at night for 2 weeks, then increase to 2 capsules at night (Patient not taking: Reported on 11/07/2016) 60 capsule 6  . PARoxetine (PAXIL) 10 MG tablet Take 1/2 tablet daily for 1 week, then increase to 1 tablet 30 tablet 6  . pravastatin  (PRAVACHOL) 40 MG tablet Take 1 tablet (40 mg total) by mouth daily. 90 tablet 1  . Probiotic Product (ALIGN) 4 MG CAPS Take 1 capsule by mouth daily.    . valsartan (DIOVAN) 160 MG tablet Take 1 tablet (160 mg total) by mouth daily. 90 tablet 0   Current Facility-Administered Medications on File Prior to Visit  Medication Dose Route Frequency Provider Last Rate Last Dose  . 0.9 %  sodium chloride infusion  500 mL Intravenous Continuous Irene Shipper, MD        ALLERGIES: No Known Allergies  FAMILY HISTORY: Family History  Problem Relation Age of Onset  . Macular degeneration Father   . Prostate cancer Father   . Hypertension Father   . Cancer Father     prostate  . Glaucoma Mother   . Emphysema Mother   . Hypertension Mother   .  Other Brother     Brain tumor  . Cancer Brother 30    brain tumor  . Breast cancer Maternal Grandmother   . Prostate cancer Brother     SOCIAL HISTORY: Social History   Social History  . Marital status: Married    Spouse name: Juanda Crumble, "Butch"  . Number of children: 2  . Years of education: N/A   Occupational History  . retired Unemployed   Social History Main Topics  . Smoking status: Never Smoker  . Smokeless tobacco: Never Used  . Alcohol use No  . Drug use: No  . Sexual activity: Yes    Partners: Male    Birth control/ protection: Surgical   Other Topics Concern  . Not on file   Social History Narrative   Exercise--no    REVIEW OF SYSTEMS: Constitutional: No fevers, chills, or sweats, no generalized fatigue, change in appetite Eyes: No visual changes, double vision, eye pain Ear, nose and throat: No hearing loss, ear pain, nasal congestion, sore throat Cardiovascular: No chest pain, palpitations Respiratory:  No shortness of breath at rest or with exertion, wheezes GastrointestinaI: No nausea, vomiting, diarrhea, abdominal pain, fecal incontinence Genitourinary:  No dysuria, urinary retention or frequency Musculoskeletal:   No neck pain, back pain Integumentary: No rash, pruritus, skin lesions Neurological: as above Psychiatric: No depression, insomnia,+ anxiety Endocrine: No palpitations, fatigue, diaphoresis, mood swings, change in appetite, change in weight, increased thirst Hematologic/Lymphatic:  No anemia, purpura, petechiae. Allergic/Immunologic: no itchy/runny eyes, nasal congestion, recent allergic reactions, rashes  PHYSICAL EXAM: Vitals:   12/15/16 1455  BP: 128/60  Pulse: 76  Resp: 16  Temp: 98.1 F (36.7 C)   General: No acute distress Head:  Normocephalic/atraumatic, no temporal tenderness or ropiness Neck: supple, no paraspinal tenderness, full range of motion Heart:  Regular rate and rhythm Lungs:  Clear to auscultation bilaterally Back: No paraspinal tenderness Skin/Extremities: No rash, no edema Neurological Exam: alert and oriented to person, place, and time. No aphasia or dysarthria. Fund of knowledge is appropriate.  Remote memory intact.  Attention and concentration are normal.    Able to name objects and repeat phrases. Cranial nerves: Pupils equal, round, reactive to light.  Extraocular movements intact with no nystagmus. Visual fields full. Facial sensation intact. No facial asymmetry. Tongue, uvula, palate midline.  Motor: Bulk and tone normal, muscle strength: 4/5 left foot dorsiflexion, inversion and eversion, otherwise 5/5 throughout with no pronator drift.  Sensation intact to light touch. Deep tendon reflexes +1 throughout, toes downgoing.  Finger to nose testing intact.  Gait improved,still with steppage gait but much improved.  Romberg negative.  IMPRESSION: This is a pleasant 71 yo RH woman with a history of hypertension, hyperlipidemia, hypothyroidism, who presented for worsening memory. MOCA score in July 2017 was 21/30, indicating mild cognitive impairment. MRI findings of incidental finding of meningioma in the clivus region, she is asymptomatic from this. She had  Neuropsychological testing and will have a follow-up with Dr. Si Raider on 4/17 to discuss results. Continue Aricept 10mg  daily. She presented with new onset foot drop on her last visit, EMG showed left peroneal neuropathy at the fibular head. Strength has improved, continue using foot orthosis and HEP, avoid leg crossing. The headaches have resolved. She will follow-up in 6 months and knows to call for any changes.   Thank you for allowing me to participate in her care.  Please do not hesitate to call for any questions or concerns.  The duration of this appointment  visit was 15 minutes of face-to-face time with the patient.  Greater than 50% of this time was spent in counseling, explanation of diagnosis, planning of further management, and coordination of care.   Ellouise Newer, M.D.   CC: Dr. Cheri Rous

## 2016-12-15 NOTE — Patient Instructions (Signed)
1. Continue all your medications 2. Continue home exercises for foot, avoid crossing your legs 3. We will discuss results of memory testing on the phone and follow-up on Dr. Marcia Brash recommendations 4. Follow-up in 6 months

## 2016-12-16 ENCOUNTER — Encounter: Payer: Self-pay | Admitting: Internal Medicine

## 2016-12-23 ENCOUNTER — Encounter: Payer: Medicare HMO | Admitting: Psychology

## 2016-12-26 NOTE — Progress Notes (Signed)
NEUROPSYCHOLOGICAL EVALUATION   Name:    Michelle [name]  Date of Birth:   03/10/1946 Date of Interview:  11/24/2016 Date of Testing:  12/04/2016  Date of Feedback:  12/29/2016       Background Information:  Reason for Referral:  Michelle [name] is a 71 y.o. right handed female referred by Dr. Ellouise Newer to assess her current level of cognitive functioning and assist in differential diagnosis. The current evaluation consisted of a review of available medical records, an interview with the patient and her husband and two adult daughters, and the completion of a neuropsychological testing battery. Informed consent was obtained.  History of Presenting Problem:  Michelle [name] is followed by Dr. Delice Lesch for memory loss. She was last seen by Dr. Delice Lesch on 10/10/2016. MoCA score in July 2017 was 21/30, consistent with MCI. She is prescribed Aricept. MRI of the brain without contrast on 04/25/2016 revealed incidental finding of 2.7 cm extra-axial mass along dorsal clivus, most likely a meningioma, with mild mass effect on the pons; otherwise unremarkable appearance of the brain for age. Follow up MRI with contrast on 05/30/2016 showed mass effect on the right pons and basilar artery, no invasion of the cavernous sinus or sella.   At today's visit, the patient acknowledges memory decline, and notes that it bothers and scares her. She states she has trouble finding things. She states she worries about getting lost when she is driving. She denies ever having gotten lost, but she admits she has made wrong turns and been uncertain about directions.   Her family first noticed memory decline almost 2 years ago. The patient did not initially notice it. They reported gradual onset with progressive decline, more rapid decline over the past year. She forgets recent conversations and she repeats herself much more frequently. She seems to have more trouble holding her attention during conversation. Sometimes her  family is unsure if she is demonstrating reduced attention or reduced comprehension. She does have hearing loss; she wears hearing aids and gets them checked 2-3 times per year. Her daughters noted that on one occasion recently, they were visiting at her house, and she fell asleep while they were chatting in the same room. Her husband also reports that she has more of a tendency to sit and do nothing, and when he asks her if she is okay, she says "yes, I'm just sitting here".  She is able to manage all basic ADLs, but she is having more difficulty completing complex tasks like paying bills and balancing the checkbook. Her husband recently asked her daughters to start helping with management of the finances. She was also demonstrating confusion with her medications, thinking she needed to stop certain pills, and using multiple pillboxes. Her daughter helped get this organized into AM and PM pillboxes which helped. She independently organizes the pills into the pillboxes weekly and denies any problems doing this. Her other daughter is cleaning her house now because she was not keeping up with it. She reports reduced motivation. She does less cooking and makes simple meals when she does cook. She has her husband do most of the grocery shopping now. 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 Her daughter knows Dealer. The manager says she is doing okay, but Mondays are the hardest for her (after she's had a long weekend). It has helped her to have a structured routine (going to  work four days a week).  Her family reports that she is demonstrating agitation and irritability which is very out of character for her. She gets very argumentative with her husband. She is more outspoken and has used profanity which she never did in the past. They also note she is more withdrawn and less interactive in social situations. The patient says it is  hard for her to hear and comprehend group conversations.  The patient admits she is feeling down some days and has much less motivation to do things. She denies any sleep difficulty. She has not had any hallucinations. She has not had any suicidal ideation. Psychiatric history was denied.  The patient has had significant weight loss over the past year (31 lbs total in one year, and she lost 22 of that in 5 mos). She has been to multiple specialists about the unintended weight loss and no etiology has been found. She has a colonoscopy and endoscopy next month. Her bloodwork has reportedly been normal. She does eat meals, but still does not gain weight. She did not like drinking Ensure.  She denies any chronic pain. She has occasional headaches. She developed left foot drop and had NCV with EMG on 10/14/2016, which confirmed injury of the nerve due to compression at the knee. She started physical therapy and was advised to avoid crossing her legs. She had one fall in the past month, when she was not wearing supportive shoes. She was with her daughter and luckily her daughter was able to catch her before she struck the ground.  There is no known family history of dementia. Her brother had a brain tumor and passed away shortly after undergoing surgical resection. This worries the patient and her family given her MRI finding of meningioma.    Social History: Born/Raised: [place] Education: High school Marital history: Married x46 years. Two daughters. Four grandchildren. Alcohol/Tobacco/Substances: No alcohol, never a smoker. No SA.   Medical History:  Past Medical History:  Diagnosis Date  . Adenomatous colon polyp   . Allergy   . GERD (gastroesophageal reflux disease)   . Glaucoma   . Hx of hearing loss    bilateral hearing aids  . Hyperlipidemia   . Hypertension   . IBS (irritable bowel syndrome)   . Low back pain   . Thyroid disease     Current medications:  Outpatient Encounter  Prescriptions as of 12/29/2016  Medication Sig  . ALPHAGAN P 0.1 % SOLN   . AMBULATORY NON FORMULARY MEDICATION IBgard: Takes prn  . aspirin 81 MG tablet Take 160 mg by mouth daily.  . benzonatate (TESSALON) 200 MG capsule Take 1 capsule (200 mg total) by mouth 3 (three) times daily as needed for cough.  . calcium carbonate (OS-CAL) 600 MG TABS Take 600 mg by mouth 2 (two) times daily with a meal.  . donepezil (ARICEPT) 10 MG tablet Take 1 tablet daily  . esomeprazole (NEXIUM) 40 MG capsule Take 1 capsule (40 mg total) by mouth daily at 12 noon.  . fluticasone (FLONASE) 50 MCG/ACT nasal spray Place 2 sprays into both nostrils daily. (Patient not taking: Reported on 12/10/2016)  . HYDROcodone-homatropine (HYCODAN) 5-1.5 MG/5ML syrup Take 5 mLs by mouth every 6 (six) hours as needed for cough. (Patient not taking: Reported on 12/15/2016)  . HYDROcodone-homatropine (HYCODAN) 5-1.5 MG/5ML syrup Take 5 mLs by mouth every 6 (six) hours as needed for cough.  . levocetirizine (XYZAL) 5 MG tablet Take 1 tablet by mouth daily.  Marland Kitchen  levothyroxine (SYNTHROID, LEVOTHROID) 50 MCG tablet TAKE 1 TABLET (50 MCG TOTAL) BY MOUTH DAILY.  Marland Kitchen loratadine (CLARITIN) 10 MG tablet Take 1 tablet (10 mg total) by mouth daily.  . Multiple Vitamin (MULTIVITAMIN) tablet Take 1 tablet by mouth daily.  . nortriptyline (PAMELOR) 10 MG capsule Take 1 capsule at night for 2 weeks, then increase to 2 capsules at night  . PARoxetine (PAXIL) 10 MG tablet Take 1/2 tablet daily for 1 week, then increase to 1 tablet (Patient not taking: Reported on 12/15/2016)  . pravastatin (PRAVACHOL) 40 MG tablet Take 1 tablet (40 mg total) by mouth daily.  . Probiotic Product (ALIGN) 4 MG CAPS Take 1 capsule by mouth daily.  . valsartan (DIOVAN) 160 MG tablet Take 1 tablet (160 mg total) by mouth daily.   Facility-Administered Encounter Medications as of 12/29/2016  Medication  . 0.9 %  sodium chloride infusion     Current Examination:  Behavioral  Observations:   Appearance: Neatly and appropriately dressed and groomed Gait: Ambulated independently, no gross abnormalities observed Speech: Fluent; normal rate, rhythm and volume Thought process: Linear, goal directed Affect: Blunted Interpersonal: Pleasant, appropriate Orientation: Oriented to all spheres. Accurately named the current President but could not recall his predecessor.   Tests Administered: . Test of Premorbid Functioning (TOPF) . Wechsler Adult Intelligence Scale-Fourth Edition (WAIS-IV): Similarities, Music therapist and Digit Span subtests . Repeatable Battery for the Assessment of Neuropsychological Status (RBANS) Form A:   . Neuropsychological Assessment Battery (NAB) Language Module, Form 1: Auditory Comprehension and Naming Subtests . Controlled Oral Word Association Test (COWAT) . Trail Making Test A and B . Clock drawing test . [employer] [facility]) . Generalized Anxiety Disorder - 7 item screener (GAD-7) . Beck Depression Inventory - Second edition (BDI-II)  Test Results: Note: Standardized scores are presented only for use by appropriately trained professionals and to allow for any future test-retest comparison. These scores should not be interpreted without consideration of all the information that is contained in the rest of the report. The most recent standardization samples from the test publisher or other sources were used whenever possible to derive standard scores; scores were corrected for age, gender, ethnicity and education when available.   Test Scores:  Test Name Raw Score Standardized Score Descriptor  TOPF 22/70 SS= 82 Low average  WAIS-IV Subtests     Similarities 13/36 ss= 5 Borderline  Block Design 29/66 ss= 10 Average  Digit Span Forward 8/16 ss= 8 Low end of average  Digit Span Backward 6/16 ss= 8 Low end of average  RBANS Index Scores     Immediate Memory  SS= 57 Extremely low  Visuospatial/Constructional  SS= 84 Low  average  Language  SS= 88 Low average  Attention  SS= 91 Average  Delayed Memory  SS= 44 Extremely low  Total Scale  SS= 66 Extremely low  NAB Language Subtests     Auditory Comprehension 85/89 T= 44 Average  Naming 24/31 T= 29 Impaired  COWAT-FAS 33 T= 48 Average  COWAT-Animals 15 T= 47 Average  Trail Making Test A  39" 0 errors T= 52 Average  Trail Making Test B  177" 3 errors T= 41 Low average  Clock Drawing   WNL   WCST     Total Errors 27 T= 43 Average  Perseverative Responses 15 T= 48 Average  Perseverative Errors 14 T= 47 Average  Conceptual Level Responses 25 T= 37 Low average  Categories Completed 1 11-16%   Trials  to Complete 1st Category 21 >16%   Failure to Maintain Set 1    GAD-7 5/21  Mild   BDI-II 13/63  WNL      Description of Test Results:  Premorbid verbal intellectual abilities were estimated to have been within the low average range based on a test of word reading. Psychomotor processing speed was average. Auditory attention and working memory were average. Visual-spatial construction was low average to average. Visual-spatial perception on a line orientation task was average. Language abilities were variable. Specifically, confrontation naming was impaired, and semantic verbal fluency ranged from borderline to average. Auditory comprehension was average. With regard to verbal memory, encoding and acquisition of non-contextual information (i.e., word list) was impaired across four learning trials. After a delay, free recall was severely impaired (0/10 items recalled. Performance on a yes/no recognition task was severely impaired. On another verbal memory test, encoding and acquisition of contextual auditory information (i.e., short story) was severely impaired across two learning trials. After a delay, free recall was severely impaired (recalled no aspects of the original story). With regard to non-verbal memory, delayed free recall of visual information was  borderline impaired. Executive functioning was generally within normal limits but somewhat variable. Mental flexibility and set-shifting were low average on Trails B; she committed three set loss errors on this task. Verbal fluency with phonemic search restrictions was average. Verbal abstract reasoning was borderline impaired. Deductive reasoning and problem solving were average. Performance on a clock drawing task was within normal limits. On a self-report measure of mood, the patient's responses were not  indicative of clinically significant depression, but she did endorse several symptoms in the mild range including anhedonia, pessimism, loss of self confidence, restlessness, indecisiveness, loss of energy, increased sleep, irritability, reduced appetite, concentration difficulty, fatigue and reduced libido. She denied suicidal ideation or intention. On another self-report measure -- this one assessing generalized anxiety -- she endorsed a mild level of anxiety including difficulty relaxing and worrying about many different things, with difficulty controlling her worrying.   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 Results of the current cognitive evaluation reveal several areas of impairment, including learning and memory, semantic retrieval (i.e., semantic fluency and confrontation naming), and verbal abstract reasoning. Additionally, there is evidence that her cognitive deficits are interfering with her ability to manage complex tasks, such as managing medications, finances and cooking. As such, diagnostic criteria for a dementia syndrome are met.   The patient's cognitive profile is suggestive of hippocampal consolidation dysfunction and medial-temporal lobe involvement. Alzheimer's disease is the most likely etiology, given her cognitive profile and clinical features. Her dementia appears to be in the mild stage at the present  time. She is not exhibiting significant behavioral disturbance but there are indicators of irritability and mild depression/anxiety which represent neuropsychiatric features of her dementia rather than a primary psychiatric disorder.   Recommendations: Based on the findings of the present evaluation, the following recommendations are offered:  1. The patient is considered an appropriate candidate for cholinesterase inhibitor therapy. She is encouraged to continue Aricept 10 mg as prescribed. 2. The patient should continue to receive assistance with complex ADLs, including finances, medications, and cooking. She performed within the normal range on a cognitive test highly correlated with driving ability, and having mild dementia does not automatically preclude driving ability. However, she should limit her driving to familiar locations during the daytime and avoid driving in inclement weather. Her family should continue to monitor  her driving abilities as these may change over time.   3. Education regarding Alzheimer's disease was provided to the patient and her family. The patient's family may wish to attend a local dementia caregiver support group and/or seek additional information from the Alzheimer's Association ([employer]). They may wish to seek additional resources through [employer] (Caregiver Support Program Coordinator phone: [phone], email: [email or web address]). if needed in the future.  4. The patient should continue to participate in activities which provide mental stimulation, safe cardiovascular exercise, and social interaction. She is working part-time with very routine, well-rehearsed job responsibilities. For now, this seems appropriate as it is giving her structure and mental stimulation. However, her supervisor and family will need to continue to monitor this. If she begins making more errors, demonstrating increased confusion about the work, or  appears stressed/concerned about the work, she may need to transition to other non-employment activities.  5. Consideration of neuropsychological re-assessment in one year is recommended in order to monitor cognitive status, track progression of symptoms and further assist with treatment planning.   Feedback to Patient: Michelle [name] and her husband and 2 adult daughters returned for a feedback appointment on 12/29/2016 to review the results of her neuropsychological evaluation with this provider. 60 minutes face-to-face time was spent reviewing her test results, my impressions and my recommendations as detailed above.    Total time spent on this patient's case: 90791x1 unit for interview with psychologist; [phone] units of testing by psychometrician under psychologist's supervision; [phone] units for medical record review, scoring of neuropsychological tests, interpretation of test results, preparation of this report, and review of results to the patient by psychologist.      Thank you for your referral of Michelle [name]. Please feel free to contact me if you have any questions or concerns regarding this report.

## 2016-12-29 ENCOUNTER — Encounter: Payer: Self-pay | Admitting: Psychology

## 2016-12-29 ENCOUNTER — Ambulatory Visit (INDEPENDENT_AMBULATORY_CARE_PROVIDER_SITE_OTHER): Payer: Medicare HMO | Admitting: Psychology

## 2016-12-29 DIAGNOSIS — G301 Alzheimer's disease with late onset: Secondary | ICD-10-CM

## 2016-12-29 DIAGNOSIS — F0280 Dementia in other diseases classified elsewhere without behavioral disturbance: Secondary | ICD-10-CM

## 2016-12-29 NOTE — Patient Instructions (Addendum)
Based on the findings of the neurocognitive evaluation, the following recommendations are offered:   1. Continue Aricept 10 mg as prescribed.  2. The patient should continue to receive assistance with complex ADLs, including finances, medications, and cooking. She performed within the normal range on a cognitive test highly correlated with driving ability, and having mild dementia does not automatically preclude driving ability. However, she should limit her driving to familiar locations during the daytime and avoid driving in inclement weather. Her family should continue to monitor her driving abilities as these may change over time.    3. The patient's family may wish to attend a local dementia caregiver support group and/or seek additional information from the Alzheimer's Association ([employer]). They may wish to seek additional resources through [employer] (Caregiver Support Program Coordinator phone: [phone], email: [email or web address]). if needed in the future.   4. The patient should continue to participate in activities which provide mental stimulation, safe cardiovascular exercise, and social interaction. She is working part-time with very routine, well-rehearsed job duties. For now, this seems appropriate as it is giving her structure and mental stimulation. However, her supervisor and family will need to continue to monitor this. If she begins making more errors, demonstrating increased confusion about the work, or appears stressed/concerned about the work, she may need to transition to other non-employment activities.   5. Consideration of neuropsychological re-assessment in one year is recommended in order to monitor cognitive status, track progression of symptoms and further assist with treatment planning.

## 2016-12-31 ENCOUNTER — Telehealth: Payer: Self-pay | Admitting: Neurology

## 2016-12-31 NOTE — Telephone Encounter (Signed)
Daughter Sharee Pimple had previously requested I call her re NP results, left VM on her number to call back. Thinking of starting Zoloft.

## 2017-01-02 NOTE — Telephone Encounter (Signed)
Called Sharee Pimple [phone], left VM for daughter to call back

## 2017-01-05 ENCOUNTER — Other Ambulatory Visit: Payer: Self-pay | Admitting: Family Medicine

## 2017-01-05 NOTE — Telephone Encounter (Signed)
Hi Dr. Lesle Reek was returning your call. She will be available anytime until around 3:30.   Thanks [employer]

## 2017-01-06 NOTE — Telephone Encounter (Signed)
Spoke to daughter Sharee Pimple, patient does not want to start any new medication at this time. We discussed close clinical monitoring, if symptoms worsen, we will discuss with her mother. She has a f/u in October but will be due for 1-year MRI brain in August and will need to schedule at that time.

## 2017-01-06 NOTE — Telephone Encounter (Signed)
See other phone note

## 2017-01-16 DIAGNOSIS — H401132 Primary open-angle glaucoma, bilateral, moderate stage: Secondary | ICD-10-CM | POA: Diagnosis not present

## 2017-01-16 DIAGNOSIS — H04123 Dry eye syndrome of bilateral lacrimal glands: Secondary | ICD-10-CM | POA: Diagnosis not present

## 2017-02-20 DIAGNOSIS — H10413 Chronic giant papillary conjunctivitis, bilateral: Secondary | ICD-10-CM | POA: Diagnosis not present

## 2017-03-31 ENCOUNTER — Other Ambulatory Visit: Payer: Self-pay | Admitting: *Deleted

## 2017-03-31 MED ORDER — LORATADINE 10 MG PO TABS: 10.0000 mg | ORAL_TABLET | Freq: Every day | ORAL | 1 refills | Status: DC

## 2017-04-02 ENCOUNTER — Other Ambulatory Visit: Payer: Self-pay | Admitting: Family Medicine

## 2017-04-03 DIAGNOSIS — H401132 Primary open-angle glaucoma, bilateral, moderate stage: Secondary | ICD-10-CM | POA: Diagnosis not present

## 2017-04-03 DIAGNOSIS — H10413 Chronic giant papillary conjunctivitis, bilateral: Secondary | ICD-10-CM | POA: Diagnosis not present

## 2017-05-01 ENCOUNTER — Other Ambulatory Visit: Payer: Self-pay | Admitting: Family Medicine

## 2017-05-04 ENCOUNTER — Other Ambulatory Visit: Payer: Self-pay | Admitting: Family Medicine

## 2017-05-04 ENCOUNTER — Telehealth: Payer: Self-pay | Admitting: Neurology

## 2017-05-04 NOTE — Telephone Encounter (Signed)
Ok, with and without contrast for Dx. meningioma

## 2017-05-04 NOTE — Telephone Encounter (Signed)
OK to place orders?   With and without?

## 2017-05-04 NOTE — Telephone Encounter (Signed)
Patient's daughter called to see if you could call her regarding setting up her yearly MRI at [place]. Please call. Thanks

## 2017-05-05 ENCOUNTER — Telehealth: Payer: Self-pay | Admitting: Neurology

## 2017-05-05 ENCOUNTER — Other Ambulatory Visit: Payer: Self-pay

## 2017-05-05 DIAGNOSIS — D329 Benign neoplasm of meninges, unspecified: Secondary | ICD-10-CM

## 2017-05-05 NOTE — Telephone Encounter (Signed)
Orders placed in EPIC, [facility] will call pt to schedule

## 2017-05-05 NOTE — Telephone Encounter (Signed)
LMOM letting Michelle [name] know that I have placed the orders for MRI and that [facility] will contact her to schedule

## 2017-05-05 NOTE — Telephone Encounter (Signed)
PT's daughter Sharee Pimple called and said her mother has an MRI done every year and asks if Dr Delice Lesch can put that order in for it

## 2017-05-08 ENCOUNTER — Encounter (HOSPITAL_COMMUNITY): Payer: Self-pay | Admitting: Nurse Practitioner

## 2017-05-08 ENCOUNTER — Emergency Department (HOSPITAL_COMMUNITY)
Admission: EM | Admit: 2017-05-08 | Discharge: 2017-05-09 | Disposition: A | Discharge status: Home / Self Care | Payer: Medicare HMO | Attending: Emergency Medicine | Admitting: Emergency Medicine

## 2017-05-08 DIAGNOSIS — Z7982 Long term (current) use of aspirin: Secondary | ICD-10-CM | POA: Insufficient documentation

## 2017-05-08 DIAGNOSIS — R51 Headache: Secondary | ICD-10-CM | POA: Insufficient documentation

## 2017-05-08 DIAGNOSIS — I10 Essential (primary) hypertension: Secondary | ICD-10-CM | POA: Diagnosis not present

## 2017-05-08 DIAGNOSIS — Z79899 Other long term (current) drug therapy: Secondary | ICD-10-CM | POA: Diagnosis not present

## 2017-05-08 DIAGNOSIS — R519 Headache, unspecified: Secondary | ICD-10-CM

## 2017-05-08 NOTE — ED Notes (Signed)
Bed: WA06 Expected date:  Expected time:  Means of arrival:  Comments: EMS- elderly

## 2017-05-08 NOTE — ED Triage Notes (Signed)
Pt is c/o severe headache 7/10 that worsened this evening. Earlier today she reportedly had an episode of emesis and has been afraid of taking anything PO. Family at bedside report an extensive neurological hx that she is under the care of a neurologist whom they called and advised them to come in to the ED should the headache persist. Pt is AOx4 and without obvious neurological deficits at this time.

## 2017-05-09 ENCOUNTER — Emergency Department (HOSPITAL_COMMUNITY): Payer: Medicare HMO

## 2017-05-09 DIAGNOSIS — Z79899 Other long term (current) drug therapy: Secondary | ICD-10-CM | POA: Diagnosis not present

## 2017-05-09 DIAGNOSIS — R51 Headache: Secondary | ICD-10-CM | POA: Diagnosis not present

## 2017-05-09 DIAGNOSIS — I10 Essential (primary) hypertension: Secondary | ICD-10-CM | POA: Diagnosis not present

## 2017-05-09 DIAGNOSIS — G4489 Other headache syndrome: Secondary | ICD-10-CM | POA: Diagnosis not present

## 2017-05-09 DIAGNOSIS — Z7982 Long term (current) use of aspirin: Secondary | ICD-10-CM | POA: Diagnosis not present

## 2017-05-09 DIAGNOSIS — R52 Pain, unspecified: Secondary | ICD-10-CM | POA: Diagnosis not present

## 2017-05-09 LAB — CBC
HCT: 36.6 % (ref 36.0–46.0)
Hemoglobin: 12.5 g/dL (ref 12.0–15.0)
MCH: 28.8 pg (ref 26.0–34.0)
MCHC: 34.2 g/dL (ref 30.0–36.0)
MCV: 84.3 fL (ref 78.0–100.0)
PLATELETS: 222 10*3/uL (ref 150–400)
RBC: 4.34 MIL/uL (ref 3.87–5.11)
RDW: 12.1 % (ref 11.5–15.5)
WBC: 6.3 10*3/uL (ref 4.0–10.5)

## 2017-05-09 LAB — COMPREHENSIVE METABOLIC PANEL
ALT: 20 U/L (ref 14–54)
ANION GAP: 9 (ref 5–15)
AST: 25 U/L (ref 15–41)
Albumin: 4.4 g/dL (ref 3.5–5.0)
Alkaline Phosphatase: 51 U/L (ref 38–126)
BUN: 21 mg/dL — ABNORMAL HIGH (ref 6–20)
CHLORIDE: 102 mmol/L (ref 101–111)
CO2: 29 mmol/L (ref 22–32)
Calcium: 9.2 mg/dL (ref 8.9–10.3)
Creatinine, Ser: 0.65 mg/dL (ref 0.44–1.00)
GFR calc non Af Amer: >60 mL/min (ref >60)
Glucose, Bld: 89 mg/dL (ref 65–99)
Potassium: 3.1 mmol/L — ABNORMAL LOW (ref 3.5–5.1)
SODIUM: 140 mmol/L (ref 135–145)
Total Bilirubin: 0.7 mg/dL (ref 0.3–1.2)
Total Protein: 7.4 g/dL (ref 6.5–8.1)

## 2017-05-09 LAB — LIPASE, BLOOD: LIPASE: 68 U/L — AB (ref 11–51)

## 2017-05-09 MED ORDER — KETOROLAC TROMETHAMINE 15 MG/ML IJ SOLN
10.0000 mg | Freq: Once | INTRAMUSCULAR | Status: AC
  Administered 2017-05-09: 10 mg via INTRAVENOUS
  Filled 2017-05-09: qty 1

## 2017-05-09 MED ORDER — METOCLOPRAMIDE HCL 5 MG/ML IJ SOLN
10.0000 mg | Freq: Once | INTRAMUSCULAR | Status: AC
  Administered 2017-05-09: 10 mg via INTRAVENOUS
  Filled 2017-05-09: qty 2

## 2017-05-09 MED ORDER — FENTANYL CITRATE (PF) 100 MCG/2ML IJ SOLN
50.0000 ug | Freq: Once | INTRAMUSCULAR | Status: AC
  Administered 2017-05-09: 50 ug via INTRAVENOUS
  Filled 2017-05-09: qty 2

## 2017-05-09 MED ORDER — ONDANSETRON 8 MG PO TBDP: 8.0000 mg | ORAL_TABLET | Freq: Three times a day (TID) | ORAL | 0 refills | Status: DC | PRN

## 2017-05-09 MED ORDER — HYDROCODONE-ACETAMINOPHEN 5-325 MG PO TABS: 1.0000 | ORAL_TABLET | Freq: Four times a day (QID) | ORAL | 0 refills | Status: DC | PRN

## 2017-05-09 NOTE — ED Provider Notes (Signed)
[facility] DEPT Provider Note: Georgena Spurling, MD, FACEP  CSN: [id number] MRN: [id number] ARRIVAL: 05/08/17 at 2226 ROOM: WA06/WA06   CHIEF COMPLAINT  Headache   HISTORY OF PRESENT ILLNESS  05/09/17 12:40 AM EVALUNA UTKE is a 71 y.o. female with a known meningioma diagnosed a year ago. She is here with intermittent headaches for the past week. The headaches are primarily bitemporal but at the present time is located frontally. She describes it as a pain but cannot say that it as throbbing or otherwise characterized. She rates it as a 7 out of 10. She has had only 2 days of relief in the past 7. She has been taking Tylenol without relief. She has not noticed any focal neurologic deficits. She has some mild dementia but her family has not noticed any mental status change. She has had some associated intermittent nausea and mild vomiting. She has had bilateral conjunctival injection which she attributes to allergies but her family is questioning this. She denies eye pain and has been using her glaucoma medications.   Past Medical History:  Diagnosis Date  . Adenomatous colon polyp   . Allergy   . GERD (gastroesophageal reflux disease)   . Glaucoma   . Hx of hearing loss    bilateral hearing aids  . Hyperlipidemia   . Hypertension   . IBS (irritable bowel syndrome)   . Low back pain   . Thyroid disease     Past Surgical History:  Procedure Laterality Date  . ABDOMINAL HYSTERECTOMY  1998  . CATARACT EXTRACTION, BILATERAL Bilateral 04/2015  . CHOLECYSTECTOMY  1998  . [place] / 2013   skin graft on gums  . TUBAL LIGATION  1977    Family History  Problem Relation Age of Onset  . Macular degeneration Father   . Prostate cancer Father   . Hypertension Father   . Cancer Father        prostate  . Glaucoma Mother   . Emphysema Mother   . Hypertension Mother   . Other Brother        Brain tumor  . Cancer Brother 63       brain tumor  . Breast cancer Maternal  Grandmother   . Prostate cancer Brother     Social History  Substance Use Topics  . Smoking status: Never Smoker  . Smokeless tobacco: Never Used  . Alcohol use No    Prior to Admission medications   Medication Sig Start Date End Date Taking? Authorizing Provider  ALPHAGAN P 0.1 % SOLN  02/27/14  Yes [provider]  AMBULATORY NON FORMULARY MEDICATION IBgard: Takes prn   Yes [provider]  aspirin 81 MG tablet Take 81 mg by mouth daily.    Yes [provider]  benzonatate (TESSALON) 200 MG capsule Take 1 capsule (200 mg total) by mouth 3 (three) times daily as needed for cough. 10/02/16  Yes Collene Gobble, MD  donepezil (ARICEPT) 10 MG tablet Take 1 tablet daily 12/15/16  Yes Cameron Sprang, MD  levothyroxine (SYNTHROID, LEVOTHROID) 50 MCG tablet TAKE 1 TABLET (50 MCG TOTAL) BY MOUTH DAILY. 04/03/17  Yes Roma Schanz R, DO  loratadine (CLARITIN) 10 MG tablet Take 1 tablet (10 mg total) by mouth daily. 03/31/17  Yes Collene Gobble, MD  Multiple Vitamin (MULTIVITAMIN) tablet Take 1 tablet by mouth daily.   Yes [provider]  nortriptyline (PAMELOR) 10 MG capsule Take 1 capsule at night  for 2 weeks, then increase to 2 capsules at night 10/14/16  Yes Lowne Chase, Yvonne R, DO  valsartan (DIOVAN) 160 MG tablet Take 1 tablet (160 mg total) by mouth daily. 10/14/16  Yes Roma Schanz R, DO  esomeprazole (NEXIUM) 40 MG capsule Take 1 capsule (40 mg total) by mouth daily at 12 noon. Patient not taking: Reported on 05/08/2017 10/14/16   Carollee Herter, Alferd Apa, DO  fluticasone [facility]) 50 MCG/ACT nasal spray Place 2 sprays into both nostrils daily. Patient not taking: Reported on 12/10/2016 10/14/16   Carollee Herter, Alferd Apa, DO  HYDROcodone-homatropine [facility]) 5-1.5 MG/5ML syrup Take 5 mLs by mouth every 6 (six) hours as needed for cough. Patient not taking: Reported on 05/08/2017 11/07/16   Collene Gobble, MD  PARoxetine (PAXIL) 10 MG tablet Take 1/2 tablet  daily for 1 week, then increase to 1 tablet Patient not taking: Reported on 12/15/2016 04/02/16   Cameron Sprang, MD  pravastatin (PRAVACHOL) 40 MG tablet Take 1 tablet (40 mg total) by mouth daily. Patient not taking: Reported on 05/08/2017 05/04/17   Ann Held, DO    Allergies Patient has no known allergies.   REVIEW OF SYSTEMS  Negative except as noted here or in the History of Present Illness.   PHYSICAL EXAMINATION  Initial Vital Signs Blood pressure (!) 162/76, pulse 66, temperature 98.9 F (37.2 C), temperature source Oral, resp. rate 12, height 5\' 3"  (1.6 m), weight 54.4 kg (120 lb), SpO2 100 %.  Examination General: Well-developed, well-nourished female in no acute distress; appearance consistent with age of record HENT: normocephalic; atraumatic Eyes: pupils equal, round and reactive to light; extraocular muscles intact; globe soft and nontender; mild bilateral conjunctival injection; disc margins sharp Neck: supple Heart: regular rate and rhythm Lungs: clear to auscultation bilaterally Abdomen: soft; nondistended; nontender; no masses or hepatosplenomegaly; bowel sounds present Extremities: No deformity; full range of motion; pulses normal Neurologic: Awake, alert and oriented; motor function intact in all extremities and symmetric; no facial droop Skin: Warm and dry Psychiatric: Normal mood and affect   RESULTS  Summary of this visit's results, reviewed by myself:   EKG Interpretation  Date/Time:    Ventricular Rate:    PR Interval:    QRS Duration:   QT Interval:    QTC Calculation:   R Axis:     Text Interpretation:        Laboratory Studies: Results for orders placed or performed during the hospital encounter of 05/08/17 (from the past 24 hour(s))  Lipase, blood     Status: Abnormal   Collection Time: 05/09/17 12:07 AM  Result Value Ref Range   Lipase 68 (H) 11 - 51 U/L  Comprehensive metabolic panel     Status: Abnormal   Collection  Time: 05/09/17 12:07 AM  Result Value Ref Range   Sodium 140 135 - 145 mmol/L   Potassium 3.1 (L) 3.5 - 5.1 mmol/L   Chloride 102 101 - 111 mmol/L   CO2 29 22 - 32 mmol/L   Glucose, Bld 89 65 - 99 mg/dL   BUN 21 (H) 6 - 20 mg/dL   Creatinine, Ser 0.65 0.44 - 1.00 mg/dL   Calcium 9.2 8.9 - 10.3 mg/dL   Total Protein 7.4 6.5 - 8.1 g/dL   Albumin 4.4 3.5 - 5.0 g/dL   AST 25 15 - 41 U/L   ALT 20 14 - 54 U/L   Alkaline Phosphatase 51 38 - 126 U/L  Total Bilirubin 0.7 0.3 - 1.2 mg/dL   GFR calc non Af Amer >60 >60 mL/min   GFR calc Af Amer >60 >60 mL/min   Anion gap 9 5 - 15  CBC     Status: None   Collection Time: 05/09/17 12:07 AM  Result Value Ref Range   WBC 6.3 4.0 - 10.5 K/uL   RBC 4.34 3.87 - 5.11 MIL/uL   Hemoglobin 12.5 12.0 - 15.0 g/dL   HCT 36.6 36.0 - 46.0 %   MCV 84.3 78.0 - 100.0 fL   MCH 28.8 26.0 - 34.0 pg   MCHC 34.2 30.0 - 36.0 g/dL   RDW 12.1 11.5 - 15.5 %   Platelets 222 150 - 400 K/uL   Imaging Studies: Ct Head Wo Contrast  Result Date: 05/09/2017 CLINICAL DATA:  Severe headaches.  Emesis. EXAM: CT HEAD WITHOUT CONTRAST TECHNIQUE: Contiguous axial images were obtained from the base of the skull through the vertex without intravenous contrast. COMPARISON:  05/30/2016 FINDINGS: Brain: Unchanged extra-axial mass at the [facility] aspect of the clivus with slight mass effect on the right pons. On prior MRI this has the appearance of a meningioma. No intracranial hemorrhage. No other mass. No extra-axial fluid collection. No evidence of acute infarction. Mild prominence of the ventricular system, unchanged. Vascular: No hyperdense vessel or unexpected calcification. Skull: Normal. Negative for fracture or focal lesion. Sinuses/Orbits: No acute finding. Other: None. IMPRESSION: Unchanged 2 cm extra-axial mass at the dorsal aspect of the clivus to the right of midline, consistent with meningioma. No acute findings. Electronically Signed   By: Andreas Newport M.D.   On:  05/09/2017 01:34    ED COURSE  Nursing notes and initial vitals signs, including pulse oximetry, reviewed.  Vitals:   05/08/17 2230 05/09/17 0144  BP: (!) 162/76 140/77  Pulse: 66 62  Resp: 12 12  Temp: 98.9 F (37.2 C)   TempSrc: Oral   SpO2: 100% 98%  Weight: 54.4 kg (120 lb)   Height: 5\' 3"  (1.6 m)    4:03 AM Pain improved after a small dose of IV fentanyl. Patient had not gotten significant relief with Reglan and Toradol.  PROCEDURES    ED DIAGNOSES     ICD-10-CM   1. Frontal headache R51        Jackston Oaxaca, MD 05/09/17 [phone]

## 2017-05-15 ENCOUNTER — Ambulatory Visit
Admission: RE | Admit: 2017-05-15 | Discharge: 2017-05-15 | Disposition: A | Discharge status: Home / Self Care | Payer: Medicare HMO | Source: Ambulatory Visit | Attending: Neurology | Admitting: Neurology

## 2017-05-15 DIAGNOSIS — D320 Benign neoplasm of cerebral meninges: Secondary | ICD-10-CM | POA: Diagnosis not present

## 2017-05-15 DIAGNOSIS — R2232 Localized swelling, mass and lump, left upper limb: Secondary | ICD-10-CM | POA: Diagnosis not present

## 2017-05-15 DIAGNOSIS — M79645 Pain in left finger(s): Secondary | ICD-10-CM | POA: Diagnosis not present

## 2017-05-15 DIAGNOSIS — D329 Benign neoplasm of meninges, unspecified: Secondary | ICD-10-CM

## 2017-05-15 DIAGNOSIS — M19042 Primary osteoarthritis, left hand: Secondary | ICD-10-CM | POA: Diagnosis not present

## 2017-05-15 MED ORDER — GADOBENATE DIMEGLUMINE 529 MG/ML IV SOLN
10.0000 mL | Freq: Once | INTRAVENOUS | Status: AC | PRN
  Administered 2017-05-15: 10 mL via INTRAVENOUS

## 2017-05-19 ENCOUNTER — Ambulatory Visit (INDEPENDENT_AMBULATORY_CARE_PROVIDER_SITE_OTHER): Payer: Medicare HMO | Admitting: Neurology

## 2017-05-19 ENCOUNTER — Encounter: Payer: Self-pay | Admitting: Neurology

## 2017-05-19 VITALS — BP 124/62 | HR 78 | Ht 63.0 in | Wt 119.0 lb

## 2017-05-19 DIAGNOSIS — D329 Benign neoplasm of meninges, unspecified: Secondary | ICD-10-CM

## 2017-05-19 DIAGNOSIS — G3184 Mild cognitive impairment, so stated: Secondary | ICD-10-CM | POA: Diagnosis not present

## 2017-05-19 DIAGNOSIS — R69 Illness, unspecified: Secondary | ICD-10-CM | POA: Diagnosis not present

## 2017-05-19 DIAGNOSIS — G301 Alzheimer's disease with late onset: Secondary | ICD-10-CM | POA: Diagnosis not present

## 2017-05-19 DIAGNOSIS — G44209 Tension-type headache, unspecified, not intractable: Secondary | ICD-10-CM | POA: Diagnosis not present

## 2017-05-19 DIAGNOSIS — F0280 Dementia in other diseases classified elsewhere without behavioral disturbance: Secondary | ICD-10-CM

## 2017-05-19 DIAGNOSIS — G5732 Lesion of lateral popliteal nerve, left lower limb: Secondary | ICD-10-CM

## 2017-05-19 MED ORDER — GABAPENTIN 100 MG PO CAPS: ORAL_CAPSULE | ORAL | 6 refills | Status: DC

## 2017-05-19 MED ORDER — DONEPEZIL HCL 10 MG PO TABS: ORAL_TABLET | ORAL | 3 refills | Status: DC

## 2017-05-19 NOTE — Progress Notes (Signed)
NEUROLOGY FOLLOW UP OFFICE NOTE  Michelle [name] [id number]  HISTORY OF PRESENT ILLNESS: I had the pleasure of seeing Michelle [name] in follow-up in the neurology clinic on 05/19/2017. She is again accompanied by her husband and daughter who helps supplement the history today. The patient was last seen 5 months ago for left foot drop and memory loss. Since her last visit, she underwent Neurocognitive testing which showed mild dementia, likely secondary to Alzheimer's disease, without behavioral disturbance. She is taking Aricept 10mg  daily without side effects. Her family is very concerned that memory continues to worsen. She had to quit her job 2 weeks ago because she was forgetting things and slowing down. Family notes she is more repetitive, within a few minutes time frame, she would ask the same thing repeatedly. She would not take her medication unless family reminds her. One day she was in a panic mode because she thought she could not get the Aricept refilled anymore, but apparently confused this with Nexium which her pulmonologist stopped. She gets irritated at herself and takes it out on family frequently. She has been having dull headaches almost daily for the past 3 weeks, around 2 weeks ago she was in bed 5 out of 7 days. She would feel better when lying down. She describes throbbing pain in her temples and frontal regions, light sensitivity, she had vomited with the headaches. She denies any prior history of migraines. I personally reviewed repeat MRI brain with and without contrast done 05/15/17 which showed stable dorsal clival meningioma, mild mass effect again noted on the right aspect of the ventral pons and midbrain, it contacts and slightly displaces the basilar artery. She denies any diplopia, dysarthria/dysphagia, no falls. Her left foot drop is now resolved, she only used the brace 2-3 days ago because she felt some tingling in her left leg, but can move her foot good now. She still  forgets to uncross her legs.   HPI 04/02/16: This is a pleasant 71 yo RH woman with a history of hypertension, hyperlipidemia, hypothyroidism, who presented for evaluation of memory loss. Her daughter started noticing memory changes a year ago. The patient states she can tell a difference in her memory, but that her family notices it more and "they may think I'm in denial." Her family has noticed worsening over the past 6 months, she repeats herself several times, sometimes having the same conversation four times within an hour. Her husband reports that she asked him repeatedly last 71th of July weekend what he was wearing to go home. She misplaces things frequently in the house. She left the stove on one time boiling water. She takes longer to get ready to go out. She denies getting lost driving, no missed bills or medications. 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 Her family also expressed concern about personality changes, she used to be laidback but now her patience is not there. She is cussing a lot more easily now. She doesn't want to do things she used to like. She does not like going out, and stayed in the room while at the beach more than with her family. No paranoia. No difficulties with ADLs. There is no family history of dementia, she denies any history of head injuries, alcohol intake. She has been having dull right frontal headaches for the past 6 months attributed to her sinuses, no associated nausea/vomiting. She denies any dizziness,  diplopia, dysarthria/dysphagia, focal numbness/tingling/weakness, bowel/bladder dysfunction. She has occasional neck and left shoulder pain. No falls.   She had an MMSE done with her PCP this month, MMSE 25/30. TSH and B12 unremarkable. She has been unable to do the MRI brain.  Diagnostic Data: Her MRI without contrast did not show any acute intracranial changes, there was note of a  meningioma in the dorsal clivus with mild mass effect on the post. Post-contrast MRI was ordered, which again showed the 50mm enhancing dural-based mass posterior to the clivus on the right. There is mass effect on the anterior pons on the right which is mildly compressed. The mass is touching the basilar causing mild displacement to the left. There is no definite invasion of the sella. It may be touching the cisternal segment of the trigeminal nerve.   PAST MEDICAL HISTORY: Past Medical History:  Diagnosis Date  . Adenomatous colon polyp   . Allergy   . GERD (gastroesophageal reflux disease)   . Glaucoma   . Hx of hearing loss    bilateral hearing aids  . Hyperlipidemia   . Hypertension   . IBS (irritable bowel syndrome)   . Low back pain   . Thyroid disease     MEDICATIONS: Current Outpatient Prescriptions on File Prior to Visit  Medication Sig Dispense Refill  . ALPHAGAN P 0.1 % SOLN     . AMBULATORY NON FORMULARY MEDICATION IBgard: Takes prn    . aspirin 81 MG tablet Take 81 mg by mouth daily.     . benzonatate (TESSALON) 200 MG capsule Take 1 capsule (200 mg total) by mouth 3 (three) times daily as needed for cough. 90 capsule 1  . donepezil (ARICEPT) 10 MG tablet Take 1 tablet daily 90 tablet 3  . HYDROcodone-acetaminophen (NORCO) 5-325 MG tablet Take 1 tablet by mouth every 6 (six) hours as needed (for headache). 10 tablet 0  . levothyroxine (SYNTHROID, LEVOTHROID) 50 MCG tablet TAKE 1 TABLET (50 MCG TOTAL) BY MOUTH DAILY. 30 tablet 3  . loratadine (CLARITIN) 10 MG tablet Take 1 tablet (10 mg total) by mouth daily. 90 tablet 1  . Multiple Vitamin (MULTIVITAMIN) tablet Take 1 tablet by mouth daily.    . nortriptyline (PAMELOR) 10 MG capsule Take 1 capsule at night for 2 weeks, then increase to 2 capsules at night 60 capsule 6  . ondansetron (ZOFRAN ODT) 8 MG disintegrating tablet Take 1 tablet (8 mg total) by mouth every 8 (eight) hours as needed for nausea or vomiting. 10 tablet  0  . valsartan (DIOVAN) 160 MG tablet Take 1 tablet (160 mg total) by mouth daily. 90 tablet 0   Current Facility-Administered Medications on File Prior to Visit  Medication Dose Route Frequency Provider Last Rate Last Dose  . 0.9 %  sodium chloride infusion  500 mL Intravenous Continuous Irene Shipper, MD        ALLERGIES: No Known Allergies  FAMILY HISTORY: Family History  Problem Relation Age of Onset  . Macular degeneration Father   . Prostate cancer Father   . Hypertension Father   . Cancer Father        prostate  . Glaucoma Mother   . Emphysema Mother   . Hypertension Mother   . Other Brother        Brain tumor  . Cancer Brother 59       brain tumor  . Breast cancer Maternal Grandmother   . Prostate cancer Brother     SOCIAL  HISTORY: Social History   Social History  . Marital status: Married    Spouse name: Juanda Crumble, "Butch"  . Number of children: 2  . Years of education: N/A   Occupational History  . retired Unemployed   Social History Main Topics  . Smoking status: Never Smoker  . Smokeless tobacco: Never Used  . Alcohol use No  . Drug use: No  . Sexual activity: Yes    Partners: Male    Birth control/ protection: Surgical   Other Topics Concern  . Not on file   Social History Narrative   Exercise--no    REVIEW OF SYSTEMS: Constitutional: No fevers, chills, or sweats, no generalized fatigue, change in appetite Eyes: No visual changes, double vision, eye pain Ear, nose and throat: No hearing loss, ear pain, nasal congestion, sore throat Cardiovascular: No chest pain, palpitations Respiratory:  No shortness of breath at rest or with exertion, wheezes GastrointestinaI: No nausea, vomiting, diarrhea, abdominal pain, fecal incontinence Genitourinary:  No dysuria, urinary retention or frequency Musculoskeletal:  No neck pain, back pain Integumentary: No rash, pruritus, skin lesions Neurological: as above Psychiatric: No depression, insomnia,+  anxiety Endocrine: No palpitations, fatigue, diaphoresis, mood swings, change in appetite, change in weight, increased thirst Hematologic/Lymphatic:  No anemia, purpura, petechiae. Allergic/Immunologic: no itchy/runny eyes, nasal congestion, recent allergic reactions, rashes  PHYSICAL EXAM: Vitals:   05/19/17 1501  BP: 124/62  Pulse: 78  SpO2: 95%   General: No acute distress Head:  Normocephalic/atraumatic, no temporal tenderness or ropiness Neck: supple, no paraspinal tenderness, full range of motion Heart:  Regular rate and rhythm Lungs:  Clear to auscultation bilaterally Back: No paraspinal tenderness Skin/Extremities: No rash, no edema Neurological Exam: alert and oriented to person, place, and time. No aphasia or dysarthria. Fund of knowledge is appropriate.  Remote memory intact. 0/3 delayed recall. Attention and concentration are normal.    Able to name objects and repeat phrases. Cranial nerves: Pupils equal, round, reactive to light.  Extraocular movements intact with no nystagmus. Visual fields full. Facial sensation intact. No facial asymmetry. Tongue, uvula, palate midline.  Motor: Bulk and tone normal, muscle strength: 5/5 throughout with no pronator drift.  Sensation intact to light touch. Deep tendon reflexes +1 throughout, toes downgoing.  Finger to nose testing intact.  Gait narrow-based and steady, able to tandem walk adequately. Romberg negative.  IMPRESSION: This is a pleasant 72 yo RH woman with a history of hypertension, hyperlipidemia, hypothyroidism, who presented for worsening memory. Neurocognitive testing showed mild dementia, likely due to Alzheimer's disease. She is on Aricept 10mg  daily but family is worried about rapid progression. She is more concerned about the daily headaches. 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 She is asymptomatic from this. We discussed switching to a different headache preventative medication, she will stop the  nortriptyline and start low dose gabapentin 100mg  qhs x 2 weeks, then increase to 200mg  qhs. Side effects were discussed, we may further uptitrate as tolerated. We discussed starting one medication at a time, after 3 months, family will call and we will plan to start Namenda at that point. We also discussed mood changes that can occur with dementia, family is concerned about weight loss and loss of interest in outdoor activities, continue to monitor, she may benefit from addition of an SSRI in the future. Family has started to attend support groups for Alzheimer's disease as they continue to have difficulties with her diagnosis. The foot drop has resolved, she needs repeated reminders to avoid nerve  compression at the knee. She will follow-up in 4-5 months and knows to call for any changes.   Thank you for allowing me to participate in her care.  Please do not hesitate to call for any questions or concerns.  The duration of this appointment visit was 25 minutes of face-to-face time with the patient.  Greater than 50% of this time was spent in counseling, explanation of diagnosis, planning of further management, and coordination of care.   Michelle [name], M.D.   CC: Dr. Cheri Rous

## 2017-05-19 NOTE — Patient Instructions (Addendum)
1. Start Gabapentin 100mg : Take 1 capsule at ight for 2 weeks, then increase to 2 capsules at night. Call our office after 2 weeks of taking the higher dose, we can further increase dose if tolerated 2. Continue Donepezil 10mg  daily 3. Stop the nortriptyline 4. Call our office in 3 months and we will start Namenda for the memory 5. Keep an eye on mood changes 6. Continue control of blood pressure, cholesterol, as well as physical exercise and brain stimulation exercises are important for brain health 7. Follow-up in 4-5 months, call for any changes  FALL PRECAUTIONS: Be cautious when walking. Scan the area for obstacles that may increase the risk of trips and falls. When getting up in the mornings, sit up at the edge of the bed for a few minutes before getting out of bed. Consider elevating the bed at the head end to avoid drop of blood pressure when getting up. Walk always in a well-lit room (use night lights in the walls). Avoid area rugs or power cords from appliances in the middle of the walkways. Use a walker or a cane if necessary and consider physical therapy for balance exercise. Get your eyesight checked regularly.  FINANCIAL OVERSIGHT: Supervision, especially oversight when making financial decisions or transactions is also recommended.  HOME SAFETY: Consider the safety of the kitchen when operating appliances like stoves, microwave oven, and blender. Consider having supervision and share cooking responsibilities until no longer able to participate in those. Accidents with firearms and other hazards in the house should be identified and addressed as well.  DRIVING: Regarding driving, in patients with progressive memory problems, driving will be impaired. We advise to have someone else do the driving if trouble finding directions or if minor accidents are reported. Independent driving assessment is available to determine safety of driving.  ABILITY TO BE LEFT ALONE: If patient is unable to  contact 911 operator, consider using LifeLine, or when the need is there, arrange for someone to stay with patients. Smoking is a fire hazard, consider supervision or cessation. Risk of wandering should be assessed by caregiver and if detected at any point, supervision and safe proof recommendations should be instituted.  MEDICATION SUPERVISION: Inability to self-administer medication needs to be constantly addressed. Implement a mechanism to ensure safe administration of the medications.  RECOMMENDATIONS FOR ALL PATIENTS WITH MEMORY PROBLEMS: 1. Continue to exercise (Recommend 30 minutes of walking everyday, or 3 hours every week) 2. Increase social interactions - continue going to [place] and enjoy social gatherings with friends and family 3. Eat healthy, avoid fried foods and eat more fruits and vegetables 4. Maintain adequate blood pressure, blood sugar, and blood cholesterol level. Reducing the risk of stroke and cardiovascular disease also helps promoting better memory. 5. Avoid stressful situations. Live a simple life and avoid aggravations. Organize your time and prepare for the next day in anticipation. 6. Sleep well, avoid any interruptions of sleep and avoid any distractions in the bedroom that may interfere with adequate sleep quality 7. Avoid sugar, avoid sweets as there is a strong link between excessive sugar intake, diabetes, and cognitive impairment We discussed the Mediterranean diet, which has been shown to help patients reduce the risk of progressive memory disorders and reduces cardiovascular risk. This includes eating fish, eat fruits and green leafy vegetables, nuts like almonds and hazelnuts, walnuts, and also use olive oil. Avoid fast foods and fried foods as much as possible. Avoid sweets and sugar as sugar use has been linked to  worsening of memory function.  There is always a concern of gradual progression of memory problems. If this is the case, then we may need to adjust  level of care according to patient needs. Support, both to the patient and caregiver, should then be put into place.

## 2017-05-28 ENCOUNTER — Other Ambulatory Visit: Payer: Self-pay | Admitting: Neurology

## 2017-05-28 DIAGNOSIS — G44209 Tension-type headache, unspecified, not intractable: Secondary | ICD-10-CM

## 2017-06-16 ENCOUNTER — Ambulatory Visit: Payer: Medicare HMO | Admitting: Neurology

## 2017-07-10 ENCOUNTER — Encounter: Payer: Self-pay | Admitting: Emergency Medicine

## 2017-07-10 ENCOUNTER — Ambulatory Visit (INDEPENDENT_AMBULATORY_CARE_PROVIDER_SITE_OTHER): Payer: Medicare HMO | Admitting: Emergency Medicine

## 2017-07-10 DIAGNOSIS — R05 Cough: Secondary | ICD-10-CM

## 2017-07-10 DIAGNOSIS — H401132 Primary open-angle glaucoma, bilateral, moderate stage: Secondary | ICD-10-CM | POA: Diagnosis not present

## 2017-07-10 DIAGNOSIS — R059 Cough, unspecified: Secondary | ICD-10-CM

## 2017-07-10 MED ORDER — LEVOCETIRIZINE DIHYDROCHLORIDE 5 MG PO TABS: 5.0000 mg | ORAL_TABLET | Freq: Every evening | ORAL | 5 refills | Status: DC

## 2017-07-10 MED ORDER — BENZONATATE 200 MG PO CAPS: 200.0000 mg | ORAL_CAPSULE | Freq: Three times a day (TID) | ORAL | 1 refills | Status: DC | PRN

## 2017-07-10 MED ORDER — FLUTICASONE PROPIONATE 50 MCG/ACT NA SUSP: 2.0000 | Freq: Every day | NASAL | 5 refills | Status: AC

## 2017-07-10 NOTE — Assessment & Plan Note (Signed)
With significant contribution from allergic rhinitis.  There may be some contribution from dysphasia, swallowing mechanics in the setting of her Alzheimer's.  She does not have any significant GERD and did not miss the Nexium when we stopped it.  Please stop Claritin for now.  Start taking Xyzal every evening Restart your fluticasone nasal spray, 2 sprays each side daily. You can consider stopping this after the Fall allergy season.  Use tessalon perles up to every 6 hours if needed for cough suppression.  Follow with Dr Lamonte Sakai as needed for any changes in your cough or breathing.

## 2017-07-10 NOTE — Progress Notes (Signed)
Subjective:    Patient ID: Michelle [name], female    DOB: 22-Mar-1946, 71 y.o.   MRN: [id number]  HPI 71 year old woman, never smoker, history of allergic rhinitis, GERD, hypertension, irritable bowel syndrome, mild cognitive impairment on aricept. She is referred by gastroenterology for evaluation of chronic cough, worse for the last year. Seems to be worst in the am, clears some mucous every morning, clear to white. Can also happen at night when she lays down, during day as well with change in air temperature, sometimes with eating. Denies any aspiration sx. She has allergies, has to blow nose, has congestion. No overt GERD symptoms.   ROV 11/07/16 -- Follow-up visit for patient with a history of GERD, rhinitis, IBS, HTN. Evaluated for chronic cough. We tried treating GERD and rhinitis more aggressively > added fluticasone nasal spray, loratadine, chlorpheniramine. We also started Nexium. Tessalon for cough suppression. CXR was done 10/02/16 that I have personally reviewed and which showed no evidence of abnormality. PFT from 2009 were reviewed and were normal. She is still having some every day cough, is better than our last visit. She has used the [employer] and hycodan almost every day. Her nasal gtt is better but still occurs some.   ROV 07/10/17 --this is a follow-up visit for very pleasant woman, never smoker, with a history of allergic rhinitis, GERD, hypertension, late onset Alzheimer's on Aricept.  She is seen for chronic cough.  At her last visit we felt that she had improved with treatment of her allergic rhinitis.  I told her that she could stop her Nexium to see if she declined off the medication. She uses loratadine, sometimes takes xyzal qhs. She is off of fluticasone nasal spray right now.     Review of Systems  Constitutional: Positive for appetite change and unexpected weight change. Negative for fever.  HENT: Negative for congestion, dental problem, ear pain, nosebleeds, postnasal  drip, rhinorrhea, sinus pressure, sneezing, sore throat and trouble swallowing.   Eyes: Negative for redness and itching.  Respiratory: Positive for cough. Negative for chest tightness, shortness of breath and wheezing.   Cardiovascular: Negative for palpitations and leg swelling.  Gastrointestinal: Negative for nausea and vomiting.  Genitourinary: Negative for dysuria.  Musculoskeletal: Negative for joint swelling.  Skin: Negative for rash.  Neurological: Positive for headaches.  Hematological: Does not bruise/bleed easily.  Psychiatric/Behavioral: Negative for dysphoric mood. The patient is not nervous/anxious.    Past Medical History:  Diagnosis Date  . Adenomatous colon polyp   . Allergy   . GERD (gastroesophageal reflux disease)   . Glaucoma   . Hx of hearing loss    bilateral hearing aids  . Hyperlipidemia   . Hypertension   . IBS (irritable bowel syndrome)   . Low back pain   . Thyroid disease      Family History  Problem Relation Age of Onset  . Macular degeneration Father   . Prostate cancer Father   . Hypertension Father   . Cancer Father        prostate  . Glaucoma Mother   . Emphysema Mother   . Hypertension Mother   . Other Brother        Brain tumor  . Cancer Brother 37       brain tumor  . Breast cancer Maternal Grandmother   . Prostate cancer Brother      Social History   Social History  . Marital status: Married    Spouse name: Juanda Crumble, "Butch"  .  Number of children: 2  . Years of education: N/A   Occupational History  . retired Unemployed   Social History Main Topics  . Smoking status: Never Smoker  . Smokeless tobacco: Never Used  . Alcohol use No  . Drug use: No  . Sexual activity: Yes    Partners: Male    Birth control/ protection: Surgical   Other Topics Concern  . Not on file   Social History Narrative   Exercise--no     Allergies  Allergen Reactions  . No Known Allergies      Outpatient Medications Prior to Visit    Medication Sig Dispense Refill  . ALPHAGAN P 0.1 % SOLN     . AMBULATORY NON FORMULARY MEDICATION IBgard: Takes prn    . aspirin 81 MG tablet Take 81 mg by mouth daily.     Marland Kitchen donepezil (ARICEPT) 10 MG tablet Take 1 tablet daily 90 tablet 3  . gabapentin (NEURONTIN) 100 MG capsule Take 1 capsule at night for 2 weeks, then increase to 2 caps at night 60 capsule 6  . HYDROcodone-acetaminophen (NORCO) 5-325 MG tablet Take 1 tablet by mouth every 6 (six) hours as needed (for headache). 10 tablet 0  . levothyroxine (SYNTHROID, LEVOTHROID) 50 MCG tablet TAKE 1 TABLET (50 MCG TOTAL) BY MOUTH DAILY. 30 tablet 3  . loratadine (CLARITIN) 10 MG tablet Take 1 tablet (10 mg total) by mouth daily. 90 tablet 1  . Multiple Vitamin (MULTIVITAMIN) tablet Take 1 tablet by mouth daily.    . ondansetron (ZOFRAN ODT) 8 MG disintegrating tablet Take 1 tablet (8 mg total) by mouth every 8 (eight) hours as needed for nausea or vomiting. 10 tablet 0  . valsartan (DIOVAN) 160 MG tablet Take 1 tablet (160 mg total) by mouth daily. 90 tablet 0  . benzonatate (TESSALON) 200 MG capsule Take 1 capsule (200 mg total) by mouth 3 (three) times daily as needed for cough. 90 capsule 1   Facility-Administered Medications Prior to Visit  Medication Dose Route Frequency Provider Last Rate Last Dose  . 0.9 %  sodium chloride infusion  500 mL Intravenous Continuous Irene Shipper, MD            Objective:   Physical Exam Vitals:   07/10/17 1613  BP: 120/70  Pulse: 65  SpO2: 95%   Gen: Pleasant, well-nourished, in no distress,  normal affect  ENT: No lesions,  mouth clear,  oropharynx clear, no postnasal drip  Neck: No JVD, no TMG, no carotid bruits  Lungs: No use of accessory muscles, clear without rales or rhonchi  Cardiovascular: RRR, heart sounds normal, no murmur or gallops, no peripheral edema  Musculoskeletal: No deformities, no cyanosis or clubbing  Neuro: alert, non focal  Skin: Warm, no lesions or  rashes      Assessment & Plan:  COUGH With significant contribution from allergic rhinitis.  There may be some contribution from dysphasia, swallowing mechanics in the setting of her Alzheimer's.  She does not have any significant GERD and did not miss the Nexium when we stopped it.  Please stop Claritin for now.  Start taking Xyzal every evening Restart your fluticasone nasal spray, 2 sprays each side daily. You can consider stopping this after the Fall allergy season.  Use tessalon perles up to every 6 hours if needed for cough suppression.  Follow with Dr Lamonte Sakai as needed for any changes in your cough or breathing.   Baltazar Apo, MD, PhD 07/10/2017, 4:32 PM  [facility] [phone] or if no answer [phone]

## 2017-07-10 NOTE — Patient Instructions (Signed)
Please stop Claritin for now.  Start taking Xyzal every evening Restart your fluticasone nasal spray, 2 sprays each side daily. You can consider stopping this after the Fall allergy season.  Use tessalon perles up to every 6 hours if needed for cough suppression.  Follow with Dr Lamonte Sakai as needed for any changes in your cough or breathing.

## 2017-07-20 ENCOUNTER — Ambulatory Visit: Payer: Medicare HMO | Admitting: Family Medicine

## 2017-07-20 ENCOUNTER — Encounter: Payer: Self-pay | Admitting: Family Medicine

## 2017-07-20 VITALS — BP 132/52 | HR 60 | Temp 98.1°F | Resp 16 | Ht 63.0 in | Wt 125.8 lb

## 2017-07-20 DIAGNOSIS — Z23 Encounter for immunization: Secondary | ICD-10-CM | POA: Diagnosis not present

## 2017-07-20 DIAGNOSIS — F321 Major depressive disorder, single episode, moderate: Secondary | ICD-10-CM | POA: Diagnosis not present

## 2017-07-20 DIAGNOSIS — E039 Hypothyroidism, unspecified: Secondary | ICD-10-CM | POA: Diagnosis not present

## 2017-07-20 DIAGNOSIS — E785 Hyperlipidemia, unspecified: Secondary | ICD-10-CM

## 2017-07-20 DIAGNOSIS — R05 Cough: Secondary | ICD-10-CM

## 2017-07-20 DIAGNOSIS — R69 Illness, unspecified: Secondary | ICD-10-CM | POA: Diagnosis not present

## 2017-07-20 DIAGNOSIS — R059 Cough, unspecified: Secondary | ICD-10-CM

## 2017-07-20 MED ORDER — SERTRALINE HCL 50 MG PO TABS: 50.0000 mg | ORAL_TABLET | Freq: Every day | ORAL | 3 refills | Status: DC

## 2017-07-20 MED ORDER — BENZONATATE 200 MG PO CAPS: 200.0000 mg | ORAL_CAPSULE | Freq: Three times a day (TID) | ORAL | 1 refills | Status: DC | PRN

## 2017-07-20 NOTE — Patient Instructions (Signed)
Dementia Caregiver Guide Dementia is a term used to describe a number of symptoms that affect memory and thinking. The most common symptoms include:  Memory loss.  Trouble with language and communication.  Trouble concentrating.  Poor judgment.  Problems with reasoning.  Child-like behavior and language.  Extreme anxiety.  Angry outbursts.  Wandering from home or public places.  Dementia usually gets worse slowly over time. In the early stages, people with dementia can stay independent and safe with some help. In later stages, they need help with daily tasks such as dressing, grooming, and using the bathroom. How to help the person with dementia cope Dementia can be frightening and confusing. Here are some tips to help the person with dementia cope with changes caused by the disease. General tips  Keep the person on track with his or her routine.  Try to identify areas where the person may need help.  Be supportive, patient, calm, and encouraging.  Gently remind the person that adjusting to changes takes time.  Help with the tasks that the person has asked for help with.  Keep the person involved in daily tasks and decisions as much as possible.  Encourage conversation, but try not to get frustrated or harried if the person struggles to find words or does not seem to appreciate your help. Communication tips  When the person is talking or seems frustrated, make eye contact and hold the person's hand.  Ask specific questions that need yes or no answers.  Use simple words, short sentences, and a calm voice. Only give one direction at a time.  When offering choices, limit them to just 1 or 2.  Avoid correcting the person in a negative way.  If the person is struggling to find the right words, gently try to help him or her. How to recognize symptoms of stress Symptoms of stress in caregivers include:  Feeling frustrated or angry with the person with  dementia.  Denying that the person has dementia or that his or her symptoms will not improve.  Feeling hopeless and unappreciated.  Difficulty sleeping.  Difficulty concentrating.  Feeling anxious, irritable, or depressed.  Developing stress-related health problems.  Feeling like you have too little time for your own life.  Follow these instructions at home:  Make sure that you and the person you are caring for: ? Get regular sleep. ? Exercise regularly. ? Eat regular, nutritious meals. ? Drink enough fluid to keep your urine clear or pale yellow. ? Take over-the-counter and prescription medicines only as told by your health care providers. ? Attend all scheduled health care appointments.  Join a support group with others who are caregivers.  Ask about respite care resources so that you can have a regular break from the stress of caregiving.  Look for signs of stress in yourself and in the person you are caring for. If you notice signs of stress, take steps to manage it.  Consider any safety risks and take steps to avoid them.  Organize medications in a pill box for each day of the week.  Create a plan to handle any legal or financial matters. Get legal or financial advice if needed.  Keep a calendar in a central location to remind the person of appointments or other activities. Tips for reducing the risk of injury  Keep floors clear of clutter. Remove rugs, magazine racks, and floor lamps.  Keep hallways well lit, especially at night.  Put a handrail and nonslip mat in the bathtub   or shower.  Put childproof locks on cabinets that contain dangerous items, such as medicines, alcohol, guns, toxic cleaning items, sharp tools or utensils, matches, and lighters.  Put the locks in places where the person cannot see or reach them easily. This will help ensure that the person does not wander out of the house and get lost.  Be prepared for emergencies. Keep a list of  emergency phone numbers and addresses in a convenient area.  Remove car keys and lock garage doors so that the person does not try to get in the car and drive.  Have the person wear a bracelet that tracks locations and identifies the person as having memory problems. This should be worn at all times for safety. Where to find support: Many individuals and organizations offer support. These include:  Support groups for people with dementia and for caregivers.  Counselors or therapists.  Home health care services.  Adult day care centers.  Where to find more information: Alzheimer's Association: www.alz.org Contact a health care provider if:  The person's health is rapidly getting worse.  You are no longer able to care for the person.  Caring for the person is affecting your physical and emotional health.  The person threatens himself or herself, you, or anyone else. Summary  Dementia is a term used to describe a number of symptoms that affect memory and thinking.  Dementia usually gets worse slowly over time.  Take steps to reduce the person's risk of injury, and to plan for future care.  Caregivers need support, relief from caregiving, and time for their own lives. This information is not intended to replace advice given to you by your health care provider. Make sure you discuss any questions you have with your health care provider. Document Released: 07/29/2016 Document Revised: 07/29/2016 Document Reviewed: 07/29/2016 Elsevier Interactive Patient Education  2018 Elsevier Inc.  

## 2017-07-20 NOTE — Progress Notes (Signed)
Patient ID: KYRIANNA BARLETTA, female   DOB: 06-May-1946, 71 y.o.   MRN: [id number]     Subjective:  I acted as a Education administrator for Dr. Carollee Herter.  Guerry Bruin, [place]   Patient ID: JOSEY DETTMANN, female    DOB: [birth year], 71 y.o.   MRN: [id number]  Chief Complaint  Patient presents with  . Hyperlipidemia  . Hypertension  . Hypothyroidism    HPI  Patient is in today for follow up cholesterol, blood pressure, and thyroid.  She has been doing well on current treatment.  Patient Care Team: Carollee Herter, Alferd Apa, DO as PCP - General Calvert Cantor, MD as Consulting Physician (Ophthalmology) Irene Shipper, MD as Consulting Physician (Gastroenterology) Cameron Sprang, MD as Consulting Physician (Neurology)   Past Medical History:  Diagnosis Date  . Adenomatous colon polyp   . Allergy   . GERD (gastroesophageal reflux disease)   . Glaucoma   . Hx of hearing loss    bilateral hearing aids  . Hyperlipidemia   . Hypertension   . IBS (irritable bowel syndrome)   . Low back pain   . Thyroid disease     Past Surgical History:  Procedure Laterality Date  . ABDOMINAL HYSTERECTOMY  1998  . CATARACT EXTRACTION, BILATERAL Bilateral 04/2015  . CHOLECYSTECTOMY  1998  . [place] / 2013   skin graft on gums  . TUBAL LIGATION  1977    Family History  Problem Relation Age of Onset  . Macular degeneration Father   . Prostate cancer Father   . Hypertension Father   . Cancer Father        prostate  . Glaucoma Mother   . Emphysema Mother   . Hypertension Mother   . Other Brother        Brain tumor  . Cancer Brother 72       brain tumor  . Breast cancer Maternal Grandmother   . Prostate cancer Brother     Social History   Socioeconomic History  . Marital status: Married    Spouse name: Juanda Crumble, "Butch"  . Number of children: 2  . Years of education: Not on file  . Highest education level: Not on file  Social Needs  . Financial resource strain: Not on file  . Food  insecurity - worry: Not on file  . Food insecurity - inability: Not on file  . Transportation needs - medical: Not on file  . Transportation needs - non-medical: Not on file  Occupational History  . Occupation: retired    Fish farm manager: UNEMPLOYED  Tobacco Use  . Smoking status: Never Smoker  . Smokeless tobacco: Never Used  Substance and Sexual Activity  . Alcohol use: No  . Drug use: No  . Sexual activity: Yes    Partners: Male    Birth control/protection: Surgical  Other Topics Concern  . Not on file  Social History Narrative   Exercise--no    Outpatient Medications Prior to Visit  Medication Sig Dispense Refill  . ALPHAGAN P 0.1 % SOLN     . AMBULATORY NON FORMULARY MEDICATION IBgard: Takes prn    . aspirin 81 MG tablet Take 81 mg by mouth daily.     Marland Kitchen donepezil (ARICEPT) 10 MG tablet Take 1 tablet daily 90 tablet 3  . fluticasone (FLONASE) 50 MCG/ACT nasal spray Place 2 sprays into both nostrils daily. 16 g 5  . gabapentin (NEURONTIN) 100 MG capsule Take 1 capsule at night for 2 weeks,  then increase to 2 caps at night 60 capsule 6  . HYDROcodone-acetaminophen (NORCO) 5-325 MG tablet Take 1 tablet by mouth every 6 (six) hours as needed (for headache). 10 tablet 0  . levocetirizine (XYZAL) 5 MG tablet Take 1 tablet (5 mg total) by mouth every evening. 30 tablet 5  . levothyroxine (SYNTHROID, LEVOTHROID) 50 MCG tablet TAKE 1 TABLET (50 MCG TOTAL) BY MOUTH DAILY. 30 tablet 3  . loratadine (CLARITIN) 10 MG tablet Take 1 tablet (10 mg total) by mouth daily. 90 tablet 1  . Multiple Vitamin (MULTIVITAMIN) tablet Take 1 tablet by mouth daily.    . ondansetron (ZOFRAN ODT) 8 MG disintegrating tablet Take 1 tablet (8 mg total) by mouth every 8 (eight) hours as needed for nausea or vomiting. 10 tablet 0  . valsartan (DIOVAN) 160 MG tablet Take 1 tablet (160 mg total) by mouth daily. 90 tablet 0  . benzonatate (TESSALON) 200 MG capsule Take 1 capsule (200 mg total) by mouth 3 (three) times  daily as needed for cough. 90 capsule 1   Facility-Administered Medications Prior to Visit  Medication Dose Route Frequency Provider Last Rate Last Dose  . 0.9 %  sodium chloride infusion  500 mL Intravenous Continuous Irene Shipper, MD        Allergies  Allergen Reactions  . No Known Allergies     Review of Systems  Constitutional: Negative for fever and malaise/fatigue.  HENT: Negative for congestion.   Eyes: Negative for blurred vision.  Respiratory: Positive for cough. Negative for shortness of breath.   Cardiovascular: Negative for chest pain, palpitations and leg swelling.  Gastrointestinal: Negative for vomiting.  Musculoskeletal: Negative for back pain.  Skin: Negative for rash.  Neurological: Negative for loss of consciousness and headaches.       Objective:    Physical Exam  Constitutional: She is oriented to person, place, and time. She appears well-developed and well-nourished.  HENT:  Head: Normocephalic and atraumatic.  Eyes: Conjunctivae and EOM are normal.  Neck: Normal range of motion. Neck supple. No JVD present. Carotid bruit is not present. No thyromegaly present.  Cardiovascular: Normal rate, regular rhythm and normal heart sounds.  No murmur heard. Pulmonary/Chest: Effort normal and breath sounds normal. No respiratory distress. She has no wheezes. She has no rales. She exhibits no tenderness.  Musculoskeletal: She exhibits no edema.  Neurological: She is alert and oriented to person, place, and time.  Psychiatric: She has a normal mood and affect.  Nursing note and vitals reviewed.   BP (!) 132/52   Pulse 60   Temp 98.1 F (36.7 C) (Oral)   Resp 16   Ht 5\' 3"  (1.6 m)   Wt 125 lb 12.8 oz (57.1 kg)   SpO2 97%   BMI 22.28 kg/m  Wt Readings from Last 3 Encounters:  07/20/17 125 lb 12.8 oz (57.1 kg)  05/19/17 119 lb (54 kg)  05/08/17 120 lb (54.4 kg)   BP Readings from Last 3 Encounters:  07/20/17 (!) 132/52  07/10/17 120/70  05/19/17  124/62     Immunization History  Administered Date(s) Administered  . Influenza Split 09/23/2011  . Influenza Whole 07/26/2007, 06/30/2008, 07/26/2009, 09/12/2010  . Influenza, High Dose Seasonal PF 06/15/2013, 10/05/2015, 07/20/2017  . Influenza,inj,Quad PF,6+ Mos 06/19/2014, 10/02/2016  . Pneumococcal Conjugate-13 11/03/2014  . Pneumococcal Polysaccharide-23 09/23/2011  . Td 07/02/2005  . Zoster 11/14/2008    Health Maintenance  Topic Date Due  . Hepatitis C Screening  [birth year]  .  TETANUS/TDAP  07/03/2015  . INFLUENZA VACCINE  04/08/2017  . PAP SMEAR  10/31/2018  . MAMMOGRAM  11/16/2018  . COLONOSCOPY  12/11/2019  . DEXA SCAN  Completed  . PNA vac Low Risk Adult  Completed    Lab Results  Component Value Date   WBC 6.3 05/09/2017   HGB 12.5 05/09/2017   HCT 36.6 05/09/2017   PLT 222 05/09/2017   GLUCOSE 89 05/09/2017   CHOL 144 10/31/2016   TRIG 56.0 10/31/2016   HDL 73.30 10/31/2016   LDLCALC 60 10/31/2016   ALT 20 05/09/2017   AST 25 05/09/2017   NA 140 05/09/2017   K 3.1 (L) 05/09/2017   CL 102 05/09/2017   CREATININE 0.65 05/09/2017   BUN 21 (H) 05/09/2017   CO2 29 05/09/2017   TSH 2.11 10/06/2016   MICROALBUR 1.0 11/03/2014    Lab Results  Component Value Date   TSH 2.11 10/06/2016   Lab Results  Component Value Date   WBC 6.3 05/09/2017   HGB 12.5 05/09/2017   HCT 36.6 05/09/2017   MCV 84.3 05/09/2017   PLT 222 05/09/2017   Lab Results  Component Value Date   NA 140 05/09/2017   K 3.1 (L) 05/09/2017   CO2 29 05/09/2017   GLUCOSE 89 05/09/2017   BUN 21 (H) 05/09/2017   CREATININE 0.65 05/09/2017   BILITOT 0.7 05/09/2017   ALKPHOS 51 05/09/2017   AST 25 05/09/2017   ALT 20 05/09/2017   PROT 7.4 05/09/2017   ALBUMIN 4.4 05/09/2017   CALCIUM 9.2 05/09/2017   ANIONGAP 9 05/09/2017   GFR 81.05 10/06/2016   Lab Results  Component Value Date   CHOL 144 10/31/2016   Lab Results  Component Value Date   HDL 73.30 10/31/2016   Lab  Results  Component Value Date   LDLCALC 60 10/31/2016   Lab Results  Component Value Date   TRIG 56.0 10/31/2016   Lab Results  Component Value Date   CHOLHDL 2 10/31/2016   No results found for: HGBA1C       Assessment & Plan:   Problem List Items Addressed This Visit      Unprioritized   COUGH   Relevant Medications   benzonatate (TESSALON) 200 MG capsule   Hypothyroidism   Relevant Orders   TSH   TSH    Other Visit Diagnoses    Need for influenza vaccination    -  Primary   Relevant Orders   Flu vaccine HIGH DOSE PF (Fluzone High dose) (Completed)   Hyperlipidemia LDL goal <100       Relevant Orders   Comprehensive metabolic panel   Lipid panel   Lipid panel   TSH   CBC with Differential/Platelet   Comprehensive metabolic panel   Depression, major, single episode, moderate (HCC)       Relevant Medications   sertraline (ZOLOFT) 50 MG tablet      I have changed Anmarie W. Leser's benzonatate. I am also having her start on sertraline. Additionally, I am having her maintain her aspirin, multivitamin, ALPHAGAN P, valsartan, AMBULATORY NON FORMULARY MEDICATION, loratadine, levothyroxine, ondansetron, HYDROcodone-acetaminophen, gabapentin, donepezil, fluticasone, and levocetirizine. We will continue to administer sodium chloride.  Meds ordered this encounter  Medications  . benzonatate (TESSALON) 200 MG capsule    Sig: Take 1 capsule (200 mg total) 3 (three) times daily as needed by mouth for cough.    Dispense:  90 capsule    Refill:  1  . sertraline (  ZOLOFT) 50 MG tablet    Sig: Take 1 tablet (50 mg total) daily by mouth.    Dispense:  30 tablet    Refill:  3    CMA served as scribe during this visit. History, Physical and Plan performed by medical provider. Documentation and orders reviewed and attested to.  Ann Held, DO

## 2017-07-21 LAB — LIPID PANEL
CHOL/HDL RATIO: 2
CHOLESTEROL: 145 mg/dL (ref 0–200)
HDL: 76.3 mg/dL (ref >39.00)
LDL Cholesterol: 56 mg/dL (ref 0–99)
NonHDL: 68.48
TRIGLYCERIDES: 64 mg/dL (ref 0.0–149.0)
VLDL: 12.8 mg/dL (ref 0.0–40.0)

## 2017-07-21 LAB — CBC WITH DIFFERENTIAL/PLATELET
BASOS ABS: 0 10*3/uL (ref 0.0–0.1)
Basophils Relative: 0.8 % (ref 0.0–3.0)
EOS ABS: 0.1 10*3/uL (ref 0.0–0.7)
Eosinophils Relative: 1.4 % (ref 0.0–5.0)
HEMATOCRIT: 36.3 % (ref 36.0–46.0)
Hemoglobin: 11.8 g/dL — ABNORMAL LOW (ref 12.0–15.0)
LYMPHS PCT: 20.1 % (ref 12.0–46.0)
Lymphs Abs: 1.3 10*3/uL (ref 0.7–4.0)
MCHC: 32.4 g/dL (ref 30.0–36.0)
MCV: 90.4 fl (ref 78.0–100.0)
Monocytes Absolute: 0.5 10*3/uL (ref 0.1–1.0)
Monocytes Relative: 8.2 % (ref 3.0–12.0)
Neutro Abs: 4.6 10*3/uL (ref 1.4–7.7)
Neutrophils Relative %: 69.5 % (ref 43.0–77.0)
Platelets: 221 10*3/uL (ref 150.0–400.0)
RBC: 4.01 Mil/uL (ref 3.87–5.11)
RDW: 12.8 % (ref 11.5–15.5)
WBC: 6.6 10*3/uL (ref 4.0–10.5)

## 2017-07-21 LAB — COMPREHENSIVE METABOLIC PANEL
ALBUMIN: 4.1 g/dL (ref 3.5–5.2)
ALT: 14 U/L (ref 0–35)
AST: 18 U/L (ref 0–37)
Alkaline Phosphatase: 49 U/L (ref 39–117)
BILIRUBIN TOTAL: 0.4 mg/dL (ref 0.2–1.2)
BUN: 14 mg/dL (ref 6–23)
CALCIUM: 9.4 mg/dL (ref 8.4–10.5)
CO2: 32 mEq/L (ref 19–32)
CREATININE: 0.77 mg/dL (ref 0.40–1.20)
Chloride: 106 mEq/L (ref 96–112)
GFR: 78.45 mL/min (ref >60.00)
Glucose, Bld: 92 mg/dL (ref 70–99)
Potassium: 5.5 mEq/L — ABNORMAL HIGH (ref 3.5–5.1)
Sodium: 145 mEq/L (ref 135–145)
Total Protein: 6.8 g/dL (ref 6.0–8.3)

## 2017-07-21 LAB — TSH: TSH: 3 u[IU]/mL (ref 0.35–4.50)

## 2017-07-27 ENCOUNTER — Other Ambulatory Visit: Payer: Self-pay | Admitting: *Deleted

## 2017-07-27 DIAGNOSIS — E875 Hyperkalemia: Secondary | ICD-10-CM

## 2017-08-11 ENCOUNTER — Other Ambulatory Visit: Payer: Self-pay | Admitting: Family Medicine

## 2017-08-20 ENCOUNTER — Ambulatory Visit: Payer: Medicare HMO | Admitting: Family Medicine

## 2017-08-27 ENCOUNTER — Ambulatory Visit: Payer: Medicare HMO | Admitting: Family Medicine

## 2017-09-24 ENCOUNTER — Other Ambulatory Visit: Payer: Self-pay | Admitting: *Deleted

## 2017-09-24 MED ORDER — LORATADINE 10 MG PO TABS: 10.0000 mg | ORAL_TABLET | Freq: Every day | ORAL | 1 refills | Status: DC

## 2017-10-13 ENCOUNTER — Encounter: Payer: Self-pay | Admitting: Neurology

## 2017-10-13 ENCOUNTER — Ambulatory Visit: Payer: Medicare HMO | Admitting: Neurology

## 2017-10-13 ENCOUNTER — Other Ambulatory Visit: Payer: Self-pay

## 2017-10-13 VITALS — BP 118/62 | HR 70 | Ht 63.0 in | Wt 122.0 lb

## 2017-10-13 DIAGNOSIS — F0280 Dementia in other diseases classified elsewhere without behavioral disturbance: Secondary | ICD-10-CM

## 2017-10-13 DIAGNOSIS — G301 Alzheimer's disease with late onset: Secondary | ICD-10-CM | POA: Diagnosis not present

## 2017-10-13 DIAGNOSIS — G44209 Tension-type headache, unspecified, not intractable: Secondary | ICD-10-CM | POA: Diagnosis not present

## 2017-10-13 DIAGNOSIS — R69 Illness, unspecified: Secondary | ICD-10-CM | POA: Diagnosis not present

## 2017-10-13 MED ORDER — MEMANTINE HCL 10 MG PO TABS: 10.0000 mg | ORAL_TABLET | Freq: Two times a day (BID) | ORAL | 3 refills | Status: DC

## 2017-10-13 MED ORDER — GABAPENTIN 300 MG PO CAPS: ORAL_CAPSULE | ORAL | 3 refills | Status: DC

## 2017-10-13 MED ORDER — DONEPEZIL HCL 10 MG PO TABS: ORAL_TABLET | ORAL | 3 refills | Status: DC

## 2017-10-13 NOTE — Progress Notes (Signed)
NEUROLOGY FOLLOW UP OFFICE NOTE  KAHLEAH CRASS [id number]  DOB: 1946/03/17  HISTORY OF PRESENT ILLNESS: I had the pleasure of seeing Nakshatra Klose in follow-up in the neurology clinic on 10/13/2017. She is again accompanied by her husband and daughter Sharee Pimple who helps supplement the history today. The patient was last seen 5 months ago for mild dementia. Her left foot drop has resolved. She is taking Aricept 10mg  daily without side effects. Sharee Pimple reports she has "down days" and can tell what kind of day she is having. She repeats herself several times. She forgets medications once in a while, family would remind her. She drives occasionally, but got lost one time when she made a wrong turn and drove for well over an hour until her husband called and gave her instructions. It appears her husband is having difficulty with the diagnosis and would like for her to continue to drive (despite getting lost), because it would make her lose her confidence. She continues to have headaches around 2-3 times a week, not bad but enough to know they are there. Gabapentin was started on her last visit, she is taking 200mg  mid-day with no side effects. When asked about mood, she reports she "has her moments."   HPI 04/02/16: This is a pleasant 72 yo RH woman with a history of hypertension, hyperlipidemia, hypothyroidism, who presented for evaluation of memory loss. Her daughter started noticing memory changes a year ago. The patient states she can tell a difference in her memory, but that her family notices it more and "they may think I'm in denial." Her family has noticed worsening over the past 6 months, she repeats herself several times, sometimes having the same conversation four times within an hour. Her husband reports that she asked him repeatedly last 72th of July weekend what he was wearing to go home. She misplaces things frequently in the house. She left the stove on one time boiling water. She takes longer to get  ready to go out. She denies getting lost driving, no missed bills or medications. 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 Her family also expressed concern about personality changes, she used to be laidback but now her patience is not there. She is cussing a lot more easily now. She doesn't want to do things she used to like. She does not like going out, and stayed in the room while at the beach more than with her family. No paranoia. No difficulties with ADLs. There is no family history of dementia, she denies any history of head injuries, alcohol intake. She has been having dull right frontal headaches for the past 6 months attributed to her sinuses, no associated nausea/vomiting. She denies any dizziness, diplopia, dysarthria/dysphagia, focal numbness/tingling/weakness, bowel/bladder dysfunction. She has occasional neck and left shoulder pain. No falls.   She had an MMSE done with her PCP this month, MMSE 25/30. TSH and B12 unremarkable. She has been unable to do the MRI brain.  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 Post-contrast MRI was ordered, which again showed the 91mm enhancing dural-based mass posterior to the clivus on the right. There is mass effect on the anterior pons on the right which is mildly compressed. The mass is touching the basilar causing mild displacement to the  left. There is no definite invasion of the sella. It may be touching the cisternal segment of the trigeminal nerve.   PAST MEDICAL HISTORY: Past Medical History:  Diagnosis Date  . Adenomatous colon polyp   . Allergy   . GERD (gastroesophageal reflux disease)   . Glaucoma   . Hx of hearing loss    bilateral hearing aids  . Hyperlipidemia   . Hypertension   . IBS (irritable bowel syndrome)   . Low back pain     . Thyroid disease     MEDICATIONS: Current Outpatient Medications on File Prior to Visit  Medication Sig Dispense Refill  . ALPHAGAN P 0.1 % SOLN     . AMBULATORY NON FORMULARY MEDICATION IBgard: Takes prn    . aspirin 81 MG tablet Take 81 mg by mouth daily.     . benzonatate (TESSALON) 200 MG capsule Take 1 capsule (200 mg total) 3 (three) times daily as needed by mouth for cough. 90 capsule 1  . donepezil (ARICEPT) 10 MG tablet Take 1 tablet daily 90 tablet 3  . fluticasone (FLONASE) 50 MCG/ACT nasal spray Place 2 sprays into both nostrils daily. 16 g 5  . gabapentin (NEURONTIN) 100 MG capsule Take 1 capsule at night for 2 weeks, then increase to 2 caps at night 60 capsule 6  . HYDROcodone-acetaminophen (NORCO) 5-325 MG tablet Take 1 tablet by mouth every 6 (six) hours as needed (for headache). 10 tablet 0  . levocetirizine (XYZAL) 5 MG tablet Take 1 tablet (5 mg total) by mouth every evening. 30 tablet 5  . levothyroxine (SYNTHROID, LEVOTHROID) 50 MCG tablet TAKE 1 TABLET (50 MCG TOTAL) BY MOUTH DAILY. 30 tablet 3  . loratadine (CLARITIN) 10 MG tablet Take 1 tablet (10 mg total) by mouth daily. 90 tablet 1  . Multiple Vitamin (MULTIVITAMIN) tablet Take 1 tablet by mouth daily.    . ondansetron (ZOFRAN ODT) 8 MG disintegrating tablet Take 1 tablet (8 mg total) by mouth every 8 (eight) hours as needed for nausea or vomiting. 10 tablet 0  . pravastatin (PRAVACHOL) 40 MG tablet TAKE 1 TABLET BY MOUTH EVERY DAY 90 tablet 0  . sertraline (ZOLOFT) 50 MG tablet Take 1 tablet (50 mg total) daily by mouth. 30 tablet 3  . valsartan (DIOVAN) 160 MG tablet Take 1 tablet (160 mg total) by mouth daily. 90 tablet 0   Current Facility-Administered Medications on File Prior to Visit  Medication Dose Route Frequency Provider Last Rate Last Dose  . 0.9 %  sodium chloride infusion  500 mL Intravenous Continuous Irene Shipper, MD        ALLERGIES: Allergies  Allergen Reactions  . No Known Allergies      FAMILY HISTORY: Family History  Problem Relation Age of Onset  . Macular degeneration Father   . Prostate cancer Father   . Hypertension Father   . Cancer Father        prostate  . Glaucoma Mother   . Emphysema Mother   . Hypertension Mother   . Other Brother        Brain tumor  . Cancer Brother 54       brain tumor  . Breast cancer Maternal Grandmother   . Prostate cancer Brother     SOCIAL HISTORY: Social History   Socioeconomic History  . Marital status: Married    Spouse name: Juanda Crumble, "Butch"  . Number of children: 2  . Years of education: Not on file  .  Highest education level: Not on file  Social Needs  . Financial resource strain: Not on file  . Food insecurity - worry: Not on file  . Food insecurity - inability: Not on file  . Transportation needs - medical: Not on file  . Transportation needs - non-medical: Not on file  Occupational History  . Occupation: retired    Fish farm manager: UNEMPLOYED  Tobacco Use  . Smoking status: Never Smoker  . Smokeless tobacco: Never Used  Substance and Sexual Activity  . Alcohol use: No  . Drug use: No  . Sexual activity: Yes    Partners: Male    Birth control/protection: Surgical  Other Topics Concern  . Not on file  Social History Narrative   Exercise--no    REVIEW OF SYSTEMS: Constitutional: No fevers, chills, or sweats, no generalized fatigue, change in appetite Eyes: No visual changes, double vision, eye pain Ear, nose and throat: No hearing loss, ear pain, nasal congestion, sore throat Cardiovascular: No chest pain, palpitations Respiratory:  No shortness of breath at rest or with exertion, wheezes GastrointestinaI: No nausea, vomiting, diarrhea, abdominal pain, fecal incontinence Genitourinary:  No dysuria, urinary retention or frequency Musculoskeletal:  No neck pain, back pain Integumentary: No rash, pruritus, skin lesions Neurological: as above Psychiatric: No depression, insomnia,+ anxiety Endocrine:  No palpitations, fatigue, diaphoresis, mood swings, change in appetite, change in weight, increased thirst Hematologic/Lymphatic:  No anemia, purpura, petechiae. Allergic/Immunologic: no itchy/runny eyes, nasal congestion, recent allergic reactions, rashes  PHYSICAL EXAM: Vitals:   10/13/17 1256  BP: 118/62  Pulse: 70  SpO2: 96%   General: No acute distress Head:  Normocephalic/atraumatic, no temporal tenderness or ropiness Neck: supple, no paraspinal tenderness, full range of motion Heart:  Regular rate and rhythm Lungs:  Clear to auscultation bilaterally Back: No paraspinal tenderness Skin/Extremities: No rash, no edema Neurological Exam: alert and oriented to person, place, and time. No aphasia or dysarthria. Fund of knowledge is appropriate.  Remote memory intact. 0/3 delayed recall. Attention and concentration are normal.    Able to name objects and repeat phrases. Cranial nerves: Pupils equal, round, reactive to light.  Extraocular movements intact with no nystagmus. Visual fields full. Facial sensation intact. No facial asymmetry. Tongue, uvula, palate midline.  Motor: Bulk and tone normal, muscle strength: 5/5 throughout with no pronator drift.  Sensation intact to light touch. Deep tendon reflexes +1 throughout, toes downgoing.  Finger to nose testing intact.  Gait narrow-based and steady, able to tandem walk adequately. Romberg negative.  IMPRESSION: This is a pleasant 72 yo RH woman with a history of hypertension, hyperlipidemia, hypothyroidism, with mild dementia, likely due to Alzheimer's disease. She is on Aricept 10mg  daily. 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 She is asymptomatic from this. She is on gabapentin 200mg  daily and continues to report 2-3 headaches a week, and will increase dose to 300mg  qhs. She and her family were instructed to keep a calendar of her headaches. Family was concerned about seemingly significant progression of dementia on Aricept, we  had discussed adding on Namenda, she will start 10mg  BID, side effects were discussed. Continue daily Aricept 10mg . We discussed no further driving. Her husband appears to have difficulties coping with the diagnosis, they will be connected to [facility] through the [employer] and were encouraged to attend support groups and educational activities. She will follow-up in 4-5 months and knows to call for any changes.   Thank you for allowing me to participate in her care.  Please do not  hesitate to call for any questions or concerns.  The duration of this appointment visit was 25 minutes of face-to-face time with the patient.  Greater than 50% of this time was spent in counseling, explanation of diagnosis, planning of further management, and coordination of care.   Ellouise Newer, M.D.   CC: Dr. Cheri Rous

## 2017-10-13 NOTE — Patient Instructions (Signed)
1. We will increase the gabapentin to help with the headaches. Your new prescription will be for Gabapentin 300mg , take 1 capsule daily. If it makes you drowsy, take it at night  2. Start Namenda 10mg  twice a day  3. Continue Aricept 10mg  daily  4. Would stop driving, or have someone with you at all times when driving  5. We will get you connected to [facility] through the [employer] to help with local resources/support groups, 24/7 support  6. Follow-up in 5 months, call for any changes  FALL PRECAUTIONS: Be cautious when walking. Scan the area for obstacles that may increase the risk of trips and falls. When getting up in the mornings, sit up at the edge of the bed for a few minutes before getting out of bed. Consider elevating the bed at the head end to avoid drop of blood pressure when getting up. Walk always in a well-lit room (use night lights in the walls). Avoid area rugs or power cords from appliances in the middle of the walkways. Use a walker or a cane if necessary and consider physical therapy for balance exercise. Get your eyesight checked regularly.  FINANCIAL OVERSIGHT: Supervision, especially oversight when making financial decisions or transactions is also recommended.  HOME SAFETY: Consider the safety of the kitchen when operating appliances like stoves, microwave oven, and blender. Consider having supervision and share cooking responsibilities until no longer able to participate in those. Accidents with firearms and other hazards in the house should be identified and addressed as well.  DRIVING: Regarding driving, in patients with progressive memory problems, driving will be impaired. We advise to have someone else do the driving if trouble finding directions or if minor accidents are reported. Independent driving assessment is available to determine safety of driving.  ABILITY TO BE LEFT ALONE: If patient is unable to contact 911 operator, consider using  [facility], or when the need is there, arrange for someone to stay with patients. Smoking is a fire hazard, consider supervision or cessation. Risk of wandering should be assessed by caregiver and if detected at any point, supervision and safe proof recommendations should be instituted.  MEDICATION SUPERVISION: Inability to self-administer medication needs to be constantly addressed. Implement a mechanism to ensure safe administration of the medications.  RECOMMENDATIONS FOR ALL PATIENTS WITH MEMORY PROBLEMS: 1. Continue to exercise (Recommend 30 minutes of walking everyday, or 3 hours every week) 2. Increase social interactions - continue going to [place] and enjoy social gatherings with friends and family 3. Eat healthy, avoid fried foods and eat more fruits and vegetables 4. Maintain adequate blood pressure, blood sugar, and blood cholesterol level. Reducing the risk of stroke and cardiovascular disease also helps promoting better memory. 5. Avoid stressful situations. Live a simple life and avoid aggravations. Organize your time and prepare for the next day in anticipation. 6. Sleep well, avoid any interruptions of sleep and avoid any distractions in the bedroom that may interfere with adequate sleep quality 7. Avoid sugar, avoid sweets as there is a strong link between excessive sugar intake, diabetes, and cognitive impairment We discussed the Mediterranean diet, which has been shown to help patients reduce the risk of progressive memory disorders and reduces cardiovascular risk. This includes eating fish, eat fruits and green leafy vegetables, nuts like almonds and hazelnuts, walnuts, and also use olive oil. Avoid fast foods and fried foods as much as possible. Avoid sweets and sugar as sugar use has been linked to worsening of memory function.  There  is always a concern of gradual progression of memory problems. If this is the case, then we may need to adjust level of care according to patient  needs. Support, both to the patient and caregiver, should then be put into place.

## 2017-10-20 ENCOUNTER — Encounter: Payer: Self-pay | Admitting: Neurology

## 2017-11-06 ENCOUNTER — Other Ambulatory Visit: Payer: Self-pay | Admitting: Family Medicine

## 2017-11-09 ENCOUNTER — Other Ambulatory Visit: Payer: Self-pay | Admitting: Family Medicine

## 2017-11-23 ENCOUNTER — Other Ambulatory Visit: Payer: Self-pay | Admitting: Family Medicine

## 2017-11-23 DIAGNOSIS — F321 Major depressive disorder, single episode, moderate: Secondary | ICD-10-CM

## 2018-01-08 DIAGNOSIS — H401132 Primary open-angle glaucoma, bilateral, moderate stage: Secondary | ICD-10-CM | POA: Diagnosis not present

## 2018-01-14 NOTE — Progress Notes (Signed)
Subjective:   Michelle [name] is a 72 y.o. female who presents for Medicare Annual (Subsequent) preventive examination.  Review of Systems:No ROS.  Medicare Wellness Visit. Additional risk factors are reflected in the social history.  Cardiac Risk Factors include: advanced age (>94men, >6 women);dyslipidemia Sleep patterns:   Sleeps 6-8 hrs. Fees rested                          Home Safety/Smoke Alarms: Feels safe in home. Smoke alarms in place.  Living environment; residence and Adult nurse: lives with husband. 1 story home. Daughter lives behind her.    Female:     Mammo- ordered       Dexa scan- ordered      CCS-utd. Due 2021    Objective:     Vitals: BP 110/60 (BP Location: Left Arm, Patient Position: Sitting, Cuff Size: Normal)   Pulse 67   Ht 5\' 3"  (1.6 m)   Wt 120 lb 9.6 oz (54.7 kg)   SpO2 96%   BMI 21.36 kg/m   Body mass index is 21.36 kg/m.  Advanced Directives 01/18/2018 05/08/2017 10/31/2016 10/21/2016  Does Patient Have a Medical Advance Directive? Yes No Yes Yes  Type of Paramedic of [place];Living will - [place];Living will [place];Living will  Does patient want to make changes to medical advance directive? No - Patient declined - - -  Copy of [place] in Chart? No - copy requested - No - copy requested -  Would patient like information on creating a medical advance directive? - No - Patient declined - -    Tobacco Social History   Tobacco Use  Smoking Status Never Smoker  Smokeless Tobacco Never Used     Counseling given: Not Answered   Clinical Intake: Pain : No/denies pain     Past Medical History:  Diagnosis Date  . Adenomatous colon polyp   . Allergy   . GERD (gastroesophageal reflux disease)   . Glaucoma   . Hx of hearing loss    bilateral hearing aids  . Hyperlipidemia   . Hypertension   . IBS (irritable bowel syndrome)   . Low back pain   .  Thyroid disease    Past Surgical History:  Procedure Laterality Date  . ABDOMINAL HYSTERECTOMY  1998  . CATARACT EXTRACTION, BILATERAL Bilateral 04/2015  . CHOLECYSTECTOMY  1998  . [place] / 2013   skin graft on gums  . TUBAL LIGATION  1977   Family History  Problem Relation Age of Onset  . Macular degeneration Father   . Prostate cancer Father   . Hypertension Father   . Cancer Father        prostate  . Glaucoma Mother   . Emphysema Mother   . Hypertension Mother   . Other Brother        Brain tumor  . Cancer Brother 47       brain tumor  . Breast cancer Maternal Grandmother   . Prostate cancer Brother    Social History   Socioeconomic History  . Marital status: Married    Spouse name: Juanda Crumble, "Butch"  . Number of children: 2  . Years of education: Not on file  . Highest education level: Not on file  Occupational History  . Occupation: retired    Fish farm manager: UNEMPLOYED  Social Needs  . Financial resource strain: Not on  file  . Food insecurity:    Worry: Not on file    Inability: Not on file  . Transportation needs:    Medical: Not on file    Non-medical: Not on file  Tobacco Use  . Smoking status: Never Smoker  . Smokeless tobacco: Never Used  Substance and Sexual Activity  . Alcohol use: No  . Drug use: No  . Sexual activity: Yes    Partners: Male    Birth control/protection: Surgical  Lifestyle  . Physical activity:    Days per week: Not on file    Minutes per session: Not on file  . Stress: Not on file  Relationships  . Social connections:    Talks on phone: Not on file    Gets together: Not on file    Attends religious service: Not on file    Active member of club or organization: Not on file    Attends meetings of clubs or organizations: Not on file    Relationship status: Not on file  Other Topics Concern  . Not on file  Social History Narrative   Exercise--no    Outpatient Encounter Medications as of 01/18/2018  Medication  Sig  . ALPHAGAN P 0.1 % SOLN   . AMBULATORY NON FORMULARY MEDICATION IBgard: Takes prn  . aspirin 81 MG tablet Take 81 mg by mouth daily.   . benzonatate (TESSALON) 200 MG capsule Take 1 capsule (200 mg total) 3 (three) times daily as needed by mouth for cough.  . donepezil (ARICEPT) 10 MG tablet Take 1 tablet daily  . fluticasone (FLONASE) 50 MCG/ACT nasal spray Place 2 sprays into both nostrils daily.  Marland Kitchen gabapentin (NEURONTIN) 300 MG capsule Take 1 capsule daily  . levocetirizine (XYZAL) 5 MG tablet Take 1 tablet (5 mg total) by mouth every evening.  Marland Kitchen levothyroxine (SYNTHROID, LEVOTHROID) 50 MCG tablet TAKE 1 TABLET (50 MCG TOTAL) BY MOUTH DAILY.  Marland Kitchen loratadine (CLARITIN) 10 MG tablet Take 1 tablet (10 mg total) by mouth daily.  . memantine (NAMENDA) 10 MG tablet Take 1 tablet (10 mg total) by mouth 2 (two) times daily.  . Multiple Vitamin (MULTIVITAMIN) tablet Take 1 tablet by mouth daily.  . pravastatin (PRAVACHOL) 40 MG tablet TAKE 1 TABLET BY MOUTH EVERY DAY  . sertraline (ZOLOFT) 50 MG tablet TAKE 1 TABLET (50 MG TOTAL) DAILY BY MOUTH.  . valsartan (DIOVAN) 160 MG tablet TAKE 1 TABLET BY MOUTH DAILY  . [DISCONTINUED] HYDROcodone-acetaminophen (NORCO) 5-325 MG tablet Take 1 tablet by mouth every 6 (six) hours as needed (for headache).  . [DISCONTINUED] ondansetron (ZOFRAN ODT) 8 MG disintegrating tablet Take 1 tablet (8 mg total) by mouth every 8 (eight) hours as needed for nausea or vomiting.   Facility-Administered Encounter Medications as of 01/18/2018  Medication  . 0.9 %  sodium chloride infusion    Activities of Daily Living In your present state of health, do you have any difficulty performing the following activities: 01/18/2018  Hearing? N  Vision? N  Comment wears reading glasses. Dr.Digby every 6 months.   Difficulty concentrating or making decisions? Y  Walking or climbing stairs? N  Dressing or bathing? N  Doing errands, shopping? N  Preparing Food and eating ? N    Using the Toilet? N  In the past six months, have you accidently leaked urine? N  Do you have problems with loss of bowel control? N  Managing your Medications? N  Managing your Finances? N  Housekeeping or managing  your Housekeeping? N  Some recent data might be hidden    Patient Care Team: Carollee Herter, Alferd Apa, DO as PCP - General Calvert Cantor, MD as Consulting Physician (Ophthalmology) Irene Shipper, MD as Consulting Physician (Gastroenterology) Cameron Sprang, MD as Consulting Physician (Neurology)    Assessment:   This is a routine wellness examination for [place]. Physical assessment deferred to PCP.  Exercise Activities and Dietary recommendations Current Exercise Habits: The patient does not participate in regular exercise at present Diet (meal preparation, eat out, water intake, caffeinated beverages, dairy products, fruits and vegetables): in general, a "healthy" diet  , well balanced   Goals    . Increase physical activity       Fall Risk Fall Risk  01/18/2018 10/13/2017 05/19/2017 12/15/2016 10/31/2016  Falls in the past year? No No No Yes No  Number falls in past yr: - - - 1 -  Injury with Fall? - - - No -    Depression Screen PHQ 2/9 Scores 01/18/2018 10/31/2016 10/31/2016 03/21/2016  PHQ - 2 Score 1 0 0 0     Cognitive Function MMSE - Mini Mental State Exam 01/18/2018 10/31/2016  Orientation to time 5 5  Orientation to Place 5 5  Registration 3 3  Attention/ Calculation 5 5  Recall 3 0  Language- name 2 objects 2 2  Language- repeat 1 0  Language- follow 3 step command 3 3  Language- read & follow direction 1 1  Write a sentence 1 1  Copy design 1 1  Total score 30 26   Montreal Cognitive Assessment  04/02/2016  Visuospatial/ Executive (0/5) 3  Naming (0/3) 3  Attention: Read list of digits (0/2) 1  Attention: Read list of letters (0/1) 1  Attention: Serial 7 subtraction starting at 100 (0/3) 3  Language: Repeat phrase (0/2) 2  Language : Fluency  (0/1) 1  Abstraction (0/2) 1  Delayed Recall (0/5) 1  Orientation (0/6) 5  Total 21  Adjusted Score (based on education) 21      Immunization History  Administered Date(s) Administered  . Influenza Split 09/23/2011  . Influenza Whole 07/26/2007, 06/30/2008, 07/26/2009, 09/12/2010  . Influenza, High Dose Seasonal PF 06/15/2013, 10/05/2015, 07/20/2017  . Influenza,inj,Quad PF,6+ Mos 06/19/2014, 10/02/2016  . Pneumococcal Conjugate-13 11/03/2014  . Pneumococcal Polysaccharide-23 09/23/2011  . Td 07/02/2005  . Zoster 11/14/2008    Screening Tests Health Maintenance  Topic Date Due  . Hepatitis C Screening  [birth year]  . TETANUS/TDAP  07/03/2015  . INFLUENZA VACCINE  04/08/2018  . PAP SMEAR  10/31/2018  . MAMMOGRAM  11/16/2018  . COLONOSCOPY  12/11/2019  . DEXA SCAN  Completed  . PNA vac Low Risk Adult  Completed      Plan:   Follow up with PCP as directed  Please schedule your next medicare wellness visit with me in 1 yr.  Continue to eat heart healthy diet (full of fruits, vegetables, whole grains, lean protein, water--limit salt, fat, and sugar intake) and increase physical activity as tolerated.  Continue doing brain stimulating activities (puzzles, reading, adult coloring books, staying active) to keep memory sharp.   Bring a copy of your living will and/or healthcare power of attorney to your next office visit.  I have personally reviewed and noted the following in the patient's chart:   . Medical and social history . Use of alcohol, tobacco or illicit drugs  . Current medications and supplements . Functional ability and status . Nutritional status .  Physical activity . Advanced directives . List of other physicians . Hospitalizations, surgeries, and ER visits in previous 12 months . Vitals . Screenings to include cognitive, depression, and falls . Referrals and appointments  In addition, I have reviewed and discussed with patient certain preventive  protocols, quality metrics, and best practice recommendations. A written personalized care plan for preventive services as well as general preventive health recommendations were provided to patient.     Shela Nevin, [place]  01/18/2018

## 2018-01-18 ENCOUNTER — Encounter: Payer: Self-pay | Admitting: *Deleted

## 2018-01-18 ENCOUNTER — Ambulatory Visit (INDEPENDENT_AMBULATORY_CARE_PROVIDER_SITE_OTHER): Payer: Medicare HMO | Admitting: *Deleted

## 2018-01-18 VITALS — BP 110/60 | HR 67 | Ht 63.0 in | Wt 120.6 lb

## 2018-01-18 DIAGNOSIS — Z780 Asymptomatic menopausal state: Secondary | ICD-10-CM

## 2018-01-18 DIAGNOSIS — Z1239 Encounter for other screening for malignant neoplasm of breast: Secondary | ICD-10-CM

## 2018-01-18 DIAGNOSIS — Z0000 Encounter for general adult medical examination without abnormal findings: Secondary | ICD-10-CM

## 2018-01-18 NOTE — Patient Instructions (Addendum)
Please schedule your next medicare wellness visit with me in 1 yr.  Continue to eat heart healthy diet (full of fruits, vegetables, whole grains, lean protein, water--limit salt, fat, and sugar intake) and increase physical activity as tolerated.  Continue doing brain stimulating activities (puzzles, reading, adult coloring books, staying active) to keep memory sharp.   Bring a copy of your living will and/or healthcare power of attorney to your next office visit.  I have ordered you mammogram and bone density. Please schedule  Michelle [name] , Thank you for taking time to come for your Medicare Wellness Visit. I appreciate your ongoing commitment to your health goals. Please review the following plan we discussed and let me know if I can assist you in the future.   These are the goals we discussed: Goals    . Increase physical activity       This is a list of the screening recommended for you and due dates:  Health Maintenance  Topic Date Due  .  Hepatitis C: One time screening is recommended by Center for Disease Control  (CDC) for  adults born from [birth year] through 1965.   1946/09/03  . Tetanus Vaccine  07/03/2015  . Flu Shot  04/08/2018  . Pap Smear  10/31/2018  . Mammogram  11/16/2018  . Colon Cancer Screening  12/11/2019  . DEXA scan (bone density measurement)  Completed  . Pneumonia vaccines  Completed    Health Maintenance for Postmenopausal Women Menopause is a normal process in which your reproductive ability comes to an end. This process happens gradually over a span of months to years, usually between the ages of 52 and 26. Menopause is complete when you have missed 12 consecutive menstrual periods. It is important to talk with your health care provider about some of the most common conditions that affect postmenopausal women, such as heart disease, cancer, and bone loss (osteoporosis). Adopting a healthy lifestyle and getting preventive care can help to promote your health and  wellness. Those actions can also lower your chances of developing some of these common conditions. What should I know about menopause? During menopause, you may experience a number of symptoms, such as:  Moderate-to-severe hot flashes.  Night sweats.  Decrease in sex drive.  Mood swings.  Headaches.  Tiredness.  Irritability.  Memory problems.  Insomnia.  Choosing to treat or not to treat menopausal changes is an individual decision that you make with your health care provider. What should I know about hormone replacement therapy and supplements? Hormone therapy products are effective for treating symptoms that are associated with menopause, such as hot flashes and night sweats. Hormone replacement carries certain risks, especially as you become older. If you are thinking about using estrogen or estrogen with progestin treatments, discuss the benefits and risks with your health care provider. What should I know about heart disease and stroke? Heart disease, heart attack, and stroke become more likely as you age. This may be due, in part, to the hormonal changes that your body experiences during menopause. These can affect how your body processes dietary fats, triglycerides, and cholesterol. Heart attack and stroke are both medical emergencies. There are many things that you can do to help prevent heart disease and stroke:  Have your blood pressure checked at least every 1-2 years. High blood pressure causes heart disease and increases the risk of stroke.  If you are 58-64 years old, ask your health care provider if you should take aspirin to prevent a  heart attack or a stroke.  Do not use any tobacco products, including cigarettes, chewing tobacco, or electronic cigarettes. If you need help quitting, ask your health care provider.  It is important to eat a healthy diet and maintain a healthy weight. ? Be sure to include plenty of vegetables, fruits, low-fat dairy products, and  lean protein. ? Avoid eating foods that are high in solid fats, added sugars, or salt (sodium).  Get regular exercise. This is one of the most important things that you can do for your health. ? Try to exercise for at least 150 minutes each week. The type of exercise that you do should increase your heart rate and make you sweat. This is known as moderate-intensity exercise. ? Try to do strengthening exercises at least twice each week. Do these in addition to the moderate-intensity exercise.  Know your numbers.Ask your health care provider to check your cholesterol and your blood glucose. Continue to have your blood tested as directed by your health care provider.  What should I know about cancer screening? There are several types of cancer. Take the following steps to reduce your risk and to catch any cancer development as early as possible. Breast Cancer  Practice breast self-awareness. ? This means understanding how your breasts normally appear and feel. ? It also means doing regular breast self-exams. Let your health care provider know about any changes, no matter how small.  If you are 66 or older, have a clinician do a breast exam (clinical breast exam or CBE) every year. Depending on your age, family history, and medical history, it may be recommended that you also have a yearly breast X-ray (mammogram).  If you have a family history of breast cancer, talk with your health care provider about genetic screening.  If you are at high risk for breast cancer, talk with your health care provider about having an MRI and a mammogram every year.  Breast cancer (BRCA) gene test is recommended for women who have family members with BRCA-related cancers. Results of the assessment will determine the need for genetic counseling and BRCA1 and for BRCA2 testing. BRCA-related cancers include these types: ? Breast. This occurs in males or females. ? Ovarian. ? Tubal. This may also be called fallopian  tube cancer. ? Cancer of the abdominal or pelvic lining (peritoneal cancer). ? Prostate. ? Pancreatic.  Cervical, Uterine, and Ovarian Cancer Your health care provider may recommend that you be screened regularly for cancer of the pelvic organs. These include your ovaries, uterus, and vagina. This screening involves a pelvic exam, which includes checking for microscopic changes to the surface of your cervix (Pap test).  For women ages 21-65, health care providers may recommend a pelvic exam and a Pap test every three years. For women ages 32-65, they may recommend the Pap test and pelvic exam, combined with testing for human papilloma virus (HPV), every five years. Some types of HPV increase your risk of cervical cancer. Testing for HPV may also be done on women of any age who have unclear Pap test results.  Other health care providers may not recommend any screening for nonpregnant women who are considered low risk for pelvic cancer and have no symptoms. Ask your health care provider if a screening pelvic exam is right for you.  If you have had past treatment for cervical cancer or a condition that could lead to cancer, you need Pap tests and screening for cancer for at least 20 years after your treatment.  If Pap tests have been discontinued for you, your risk factors (such as having a new sexual partner) need to be reassessed to determine if you should start having screenings again. Some women have medical problems that increase the chance of getting cervical cancer. In these cases, your health care provider may recommend that you have screening and Pap tests more often.  If you have a family history of uterine cancer or ovarian cancer, talk with your health care provider about genetic screening.  If you have vaginal bleeding after reaching menopause, tell your health care provider.  There are currently no reliable tests available to screen for ovarian cancer.  Lung Cancer Lung cancer  screening is recommended for adults 51-3 years old who are at high risk for lung cancer because of a history of smoking. A yearly low-dose CT scan of the lungs is recommended if you:  Currently smoke.  Have a history of at least 30 pack-years of smoking and you currently smoke or have quit within the past 15 years. A pack-year is smoking an average of one pack of cigarettes per day for one year.  Yearly screening should:  Continue until it has been 15 years since you quit.  Stop if you develop a health problem that would prevent you from having lung cancer treatment.  Colorectal Cancer  This type of cancer can be detected and can often be prevented.  Routine colorectal cancer screening usually begins at age 54 and continues through age 64.  If you have risk factors for colon cancer, your health care provider may recommend that you be screened at an earlier age.  If you have a family history of colorectal cancer, talk with your health care provider about genetic screening.  Your health care provider may also recommend using home test kits to check for hidden blood in your stool.  A small camera at the end of a tube can be used to examine your colon directly (sigmoidoscopy or colonoscopy). This is done to check for the earliest forms of colorectal cancer.  Direct examination of the colon should be repeated every 5-10 years until age 66. However, if early forms of precancerous polyps or small growths are found or if you have a family history or genetic risk for colorectal cancer, you may need to be screened more often.  Skin Cancer  Check your skin from head to toe regularly.  Monitor any moles. Be sure to tell your health care provider: ? About any new moles or changes in moles, especially if there is a change in a mole's shape or color. ? If you have a mole that is larger than the size of a pencil eraser.  If any of your family members has a history of skin cancer, especially at a  young age, talk with your health care provider about genetic screening.  Always use sunscreen. Apply sunscreen liberally and repeatedly throughout the day.  Whenever you are outside, protect yourself by wearing long sleeves, pants, a wide-brimmed hat, and sunglasses.  What should I know about osteoporosis? Osteoporosis is a condition in which bone destruction happens more quickly than new bone creation. After menopause, you may be at an increased risk for osteoporosis. To help prevent osteoporosis or the bone fractures that can happen because of osteoporosis, the following is recommended:  If you are 54-33 years old, get at least 1,000 mg of calcium and at least 600 mg of vitamin D per day.  If you are older than age 25  but younger than age 31, get at least 1,200 mg of calcium and at least 600 mg of vitamin D per day.  If you are older than age 79, get at least 1,200 mg of calcium and at least 800 mg of vitamin D per day.  Smoking and excessive alcohol intake increase the risk of osteoporosis. Eat foods that are rich in calcium and vitamin D, and do weight-bearing exercises several times each week as directed by your health care provider. What should I know about how menopause affects my mental health? Depression may occur at any age, but it is more common as you become older. Common symptoms of depression include:  Low or sad mood.  Changes in sleep patterns.  Changes in appetite or eating patterns.  Feeling an overall lack of motivation or enjoyment of activities that you previously enjoyed.  Frequent crying spells.  Talk with your health care provider if you think that you are experiencing depression. What should I know about immunizations? It is important that you get and maintain your immunizations. These include:  Tetanus, diphtheria, and pertussis (Tdap) booster vaccine.  Influenza every year before the flu season begins.  Pneumonia vaccine.  Shingles vaccine.  Your  health care provider may also recommend other immunizations. This information is not intended to replace advice given to you by your health care provider. Make sure you discuss any questions you have with your health care provider. Document Released: 10/17/2005 Document Revised: 03/14/2016 Document Reviewed: 05/29/2015 Elsevier Interactive Patient Education  2018 [employer].

## 2018-01-20 ENCOUNTER — Other Ambulatory Visit: Payer: Self-pay | Admitting: Neurology

## 2018-01-20 ENCOUNTER — Other Ambulatory Visit: Payer: Self-pay | Admitting: Family Medicine

## 2018-01-20 DIAGNOSIS — G44209 Tension-type headache, unspecified, not intractable: Secondary | ICD-10-CM

## 2018-01-25 ENCOUNTER — Encounter (HOSPITAL_BASED_OUTPATIENT_CLINIC_OR_DEPARTMENT_OTHER): Payer: Self-pay

## 2018-01-25 ENCOUNTER — Ambulatory Visit (HOSPITAL_BASED_OUTPATIENT_CLINIC_OR_DEPARTMENT_OTHER)
Admission: RE | Admit: 2018-01-25 | Discharge: 2018-01-25 | Disposition: A | Discharge status: Home / Self Care | Payer: Medicare HMO | Source: Ambulatory Visit | Attending: Family Medicine | Admitting: Family Medicine

## 2018-01-25 ENCOUNTER — Other Ambulatory Visit: Payer: Self-pay | Admitting: Family Medicine

## 2018-01-25 DIAGNOSIS — Z780 Asymptomatic menopausal state: Secondary | ICD-10-CM

## 2018-01-25 DIAGNOSIS — Z1239 Encounter for other screening for malignant neoplasm of breast: Secondary | ICD-10-CM

## 2018-01-25 DIAGNOSIS — M85852 Other specified disorders of bone density and structure, left thigh: Secondary | ICD-10-CM | POA: Diagnosis not present

## 2018-01-25 DIAGNOSIS — Z1231 Encounter for screening mammogram for malignant neoplasm of breast: Secondary | ICD-10-CM | POA: Insufficient documentation

## 2018-01-27 DIAGNOSIS — I10 Essential (primary) hypertension: Secondary | ICD-10-CM | POA: Diagnosis not present

## 2018-01-27 DIAGNOSIS — R69 Illness, unspecified: Secondary | ICD-10-CM | POA: Diagnosis not present

## 2018-01-27 DIAGNOSIS — Z7982 Long term (current) use of aspirin: Secondary | ICD-10-CM | POA: Diagnosis not present

## 2018-01-27 DIAGNOSIS — G309 Alzheimer's disease, unspecified: Secondary | ICD-10-CM | POA: Diagnosis not present

## 2018-01-27 DIAGNOSIS — R51 Headache: Secondary | ICD-10-CM | POA: Diagnosis not present

## 2018-01-27 DIAGNOSIS — H409 Unspecified glaucoma: Secondary | ICD-10-CM | POA: Diagnosis not present

## 2018-01-27 DIAGNOSIS — E039 Hypothyroidism, unspecified: Secondary | ICD-10-CM | POA: Diagnosis not present

## 2018-01-27 DIAGNOSIS — E785 Hyperlipidemia, unspecified: Secondary | ICD-10-CM | POA: Diagnosis not present

## 2018-01-27 DIAGNOSIS — J309 Allergic rhinitis, unspecified: Secondary | ICD-10-CM | POA: Diagnosis not present

## 2018-02-05 ENCOUNTER — Other Ambulatory Visit: Payer: Self-pay | Admitting: Family Medicine

## 2018-02-12 ENCOUNTER — Encounter: Payer: Self-pay | Admitting: *Deleted

## 2018-03-09 ENCOUNTER — Encounter: Payer: Self-pay | Admitting: *Deleted

## 2018-03-15 ENCOUNTER — Other Ambulatory Visit: Payer: Self-pay

## 2018-03-15 ENCOUNTER — Ambulatory Visit: Payer: Medicare HMO | Admitting: Neurology

## 2018-03-15 ENCOUNTER — Encounter: Payer: Self-pay | Admitting: Neurology

## 2018-03-15 VITALS — BP 120/64 | HR 70 | Ht 63.0 in | Wt 125.0 lb

## 2018-03-15 DIAGNOSIS — G301 Alzheimer's disease with late onset: Secondary | ICD-10-CM | POA: Diagnosis not present

## 2018-03-15 DIAGNOSIS — F0280 Dementia in other diseases classified elsewhere without behavioral disturbance: Secondary | ICD-10-CM | POA: Diagnosis not present

## 2018-03-15 DIAGNOSIS — R69 Illness, unspecified: Secondary | ICD-10-CM | POA: Diagnosis not present

## 2018-03-15 DIAGNOSIS — G44209 Tension-type headache, unspecified, not intractable: Secondary | ICD-10-CM | POA: Diagnosis not present

## 2018-03-15 MED ORDER — MEMANTINE HCL 10 MG PO TABS: 10.0000 mg | ORAL_TABLET | Freq: Two times a day (BID) | ORAL | 3 refills | Status: DC

## 2018-03-15 MED ORDER — DONEPEZIL HCL 10 MG PO TABS: ORAL_TABLET | ORAL | 3 refills | Status: DC

## 2018-03-15 MED ORDER — GABAPENTIN 300 MG PO CAPS: ORAL_CAPSULE | ORAL | 3 refills | Status: DC

## 2018-03-15 NOTE — Patient Instructions (Addendum)
1. Continue all your current medications 2. We will be planning for repeat MRI brain on your next follow-up 3. Follow-up in 6 months, call for any changes  FALL PRECAUTIONS: Be cautious when walking. Scan the area for obstacles that may increase the risk of trips and falls. When getting up in the mornings, sit up at the edge of the bed for a few minutes before getting out of bed. Consider elevating the bed at the head end to avoid drop of blood pressure when getting up. Walk always in a well-lit room (use night lights in the walls). Avoid area rugs or power cords from appliances in the middle of the walkways. Use a walker or a cane if necessary and consider physical therapy for balance exercise. Get your eyesight checked regularly.  FINANCIAL OVERSIGHT: Supervision, especially oversight when making financial decisions or transactions is also recommended.  HOME SAFETY: Consider the safety of the kitchen when operating appliances like stoves, microwave oven, and blender. Consider having supervision and share cooking responsibilities until no longer able to participate in those. Accidents with firearms and other hazards in the house should be identified and addressed as well.  DRIVING: Regarding driving, in patients with progressive memory problems, driving will be impaired. We advise to have someone else do the driving if trouble finding directions or if minor accidents are reported. Independent driving assessment is available to determine safety of driving.  ABILITY TO BE LEFT ALONE: If patient is unable to contact 911 operator, consider using LifeLine, or when the need is there, arrange for someone to stay with patients. Smoking is a fire hazard, consider supervision or cessation. Risk of wandering should be assessed by caregiver and if detected at any point, supervision and safe proof recommendations should be instituted.  MEDICATION SUPERVISION: Inability to self-administer medication needs to be  constantly addressed. Implement a mechanism to ensure safe administration of the medications.  RECOMMENDATIONS FOR ALL PATIENTS WITH MEMORY PROBLEMS: 1. Continue to exercise (Recommend 30 minutes of walking everyday, or 3 hours every week) 2. Increase social interactions - continue going to [place] and enjoy social gatherings with friends and family 3. Eat healthy, avoid fried foods and eat more fruits and vegetables 4. Maintain adequate blood pressure, blood sugar, and blood cholesterol level. Reducing the risk of stroke and cardiovascular disease also helps promoting better memory. 5. Avoid stressful situations. Live a simple life and avoid aggravations. Organize your time and prepare for the next day in anticipation. 6. Sleep well, avoid any interruptions of sleep and avoid any distractions in the bedroom that may interfere with adequate sleep quality 7. Avoid sugar, avoid sweets as there is a strong link between excessive sugar intake, diabetes, and cognitive impairment We discussed the Mediterranean diet, which has been shown to help patients reduce the risk of progressive memory disorders and reduces cardiovascular risk. This includes eating fish, eat fruits and green leafy vegetables, nuts like almonds and hazelnuts, walnuts, and also use olive oil. Avoid fast foods and fried foods as much as possible. Avoid sweets and sugar as sugar use has been linked to worsening of memory function.  There is always a concern of gradual progression of memory problems. If this is the case, then we may need to adjust level of care according to patient needs. Support, both to the patient and caregiver, should then be put into place.

## 2018-03-15 NOTE — Progress Notes (Signed)
NEUROLOGY FOLLOW UP OFFICE NOTE  Michelle [name] [id number]  DOB: Mar 02, 1946  HISTORY OF PRESENT ILLNESS: I had the pleasure of seeing Michelle [name] in follow-up in the neurology clinic on 03/15/2018. She is again accompanied by her husband and daughter Sharee Pimple who helps supplement the history today. The patient was last seen 5 months ago for mild dementia. On her last visit, family reported continued decline on Aricept 10mg  daily, Namenda 10mg  BID was added. She denies any side effects to medications. Family reports continued worsening, she is asking the same questions repeatedly within a shorter period of time. Sharee Pimple fills her pillbox weekly, but she sometimes forgets to take her medications on a daily basis or "piddles" with them, sometimes the same pill will be in the same slot. She sometimes gets a little mixed up on AM/PM. They were at the beach last weekend and she came out of the shower in a panic because she did not know how to turn the shower on. She still cooks and has not left the stove on, her husband always helps her. She drives minimally without getting lost. The headaches are much better with gabapentin 300mg  qhs, she does not lie in bed with headaches all the time now. She can go a week without any headaches, and they are now mild. Her husband does note the confusion is worse when she has a headache. She is able to bathe and dress independently. Mood is the same, she gets frustrated when she cannot recall what she was going to do. She still has her sense of humor but is more disinhibited sometimes. She denies any dizziness, vision changes, focal numbness/tingling/weakness, no falls.   HPI 04/02/16: This is a pleasant 72 yo RH woman with a history of hypertension, hyperlipidemia, hypothyroidism, who presented for evaluation of memory loss. Her daughter started noticing memory changes a year ago. The patient states she can tell a difference in her memory, but that her family notices it more and  "they may think I'm in denial." Her family has noticed worsening over the past 6 months, she repeats herself several times, sometimes having the same conversation four times within an hour. Her husband reports that she asked him repeatedly last 44th of July weekend what he was wearing to go home. She misplaces things frequently in the house. She left the stove on one time boiling water. She takes longer to get ready to go out. She denies getting lost driving, no missed bills or medications. 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 Her family also expressed concern about personality changes, she used to be laidback but now her patience is not there. She is cussing a lot more easily now. She doesn't want to do things she used to like. She does not like going out, and stayed in the room while at the beach more than with her family. No paranoia. No difficulties with ADLs. There is no family history of dementia, she denies any history of head injuries, alcohol intake. She has been having dull right frontal headaches for the past 6 months attributed to her sinuses, no associated nausea/vomiting. She denies any dizziness, diplopia, dysarthria/dysphagia, focal numbness/tingling/weakness, bowel/bladder dysfunction. She has occasional neck and left shoulder pain. No falls.   She had an MMSE done with her PCP this month, MMSE 25/30. TSH and B12 unremarkable. She has been unable to do the MRI brain.  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 Post-contrast MRI was ordered, which again showed the 41mm enhancing dural-based mass posterior to the clivus on the right. There is mass effect on the anterior pons on the right which is mildly compressed. The mass is touching the basilar causing mild displacement to the left. There  is no definite invasion of the sella. It may be touching the cisternal segment of the trigeminal nerve.   PAST MEDICAL HISTORY: Past Medical History:  Diagnosis Date  . Adenomatous colon polyp   . Allergy   . GERD (gastroesophageal reflux disease)   . Glaucoma   . Hx of hearing loss    bilateral hearing aids  . Hyperlipidemia   . Hypertension   . IBS (irritable bowel syndrome)   . Low back pain   . Thyroid disease     MEDICATIONS: Current Outpatient Medications on File Prior to Visit  Medication Sig Dispense Refill  . ALPHAGAN P 0.1 % SOLN     . AMBULATORY NON FORMULARY MEDICATION IBgard: Takes prn    . aspirin 81 MG tablet Take 81 mg by mouth daily.     . benzonatate (TESSALON) 200 MG capsule Take 1 capsule (200 mg total) 3 (three) times daily as needed by mouth for cough. 90 capsule 1  . donepezil (ARICEPT) 10 MG tablet Take 1 tablet daily 90 tablet 3  . fluticasone (FLONASE) 50 MCG/ACT nasal spray Place 2 sprays into both nostrils daily. 16 g 5  . gabapentin (NEURONTIN) 300 MG capsule Take 1 capsule daily 90 capsule 3  . levocetirizine (XYZAL) 5 MG tablet Take 1 tablet (5 mg total) by mouth every evening. 30 tablet 5  . levothyroxine (SYNTHROID, LEVOTHROID) 50 MCG tablet TAKE 1 TABLET BY MOUTH EVERY DAY 30 tablet 3  . loratadine (CLARITIN) 10 MG tablet Take 1 tablet (10 mg total) by mouth daily. 90 tablet 1  . memantine (NAMENDA) 10 MG tablet Take 1 tablet (10 mg total) by mouth 2 (two) times daily. 180 tablet 3  . Multiple Vitamin (MULTIVITAMIN) tablet Take 1 tablet by mouth daily.    . pravastatin (PRAVACHOL) 40 MG tablet TAKE 1 TABLET BY MOUTH EVERY DAY 90 tablet 0  . sertraline (ZOLOFT) 50 MG tablet TAKE 1 TABLET (50 MG TOTAL) DAILY BY MOUTH. 90 tablet 0  . valsartan (DIOVAN) 160 MG tablet TAKE 1 TABLET BY MOUTH DAILY 90 tablet 1   Current Facility-Administered Medications on File Prior to Visit  Medication Dose Route Frequency Provider Last Rate Last Dose  . 0.9 %   sodium chloride infusion  500 mL Intravenous Continuous Irene Shipper, MD        ALLERGIES: Allergies  Allergen Reactions  . No Known Allergies     FAMILY HISTORY: Family History  Problem Relation Age of Onset  . Macular degeneration Father   . Prostate cancer Father   . Hypertension Father   . Cancer Father        prostate  . Glaucoma Mother   . Emphysema Mother   . Hypertension Mother   . Other Brother        Brain tumor  . Cancer Brother 73       brain tumor  . Breast cancer Maternal Grandmother   . Prostate cancer Brother     SOCIAL HISTORY: Social History   Socioeconomic History  . Marital  status: Married    Spouse name: Juanda Crumble, "Butch"  . Number of children: 2  . Years of education: Not on file  . Highest education level: Not on file  Occupational History  . Occupation: retired    Fish farm manager: UNEMPLOYED  Social Needs  . Financial resource strain: Not on file  . Food insecurity:    Worry: Not on file    Inability: Not on file  . Transportation needs:    Medical: Not on file    Non-medical: Not on file  Tobacco Use  . Smoking status: Never Smoker  . Smokeless tobacco: Never Used  Substance and Sexual Activity  . Alcohol use: No  . Drug use: No  . Sexual activity: Yes    Partners: Male    Birth control/protection: Surgical  Lifestyle  . Physical activity:    Days per week: Not on file    Minutes per session: Not on file  . Stress: Not on file  Relationships  . Social connections:    Talks on phone: Not on file    Gets together: Not on file    Attends religious service: Not on file    Active member of club or organization: Not on file    Attends meetings of clubs or organizations: Not on file    Relationship status: Not on file  . Intimate partner violence:    Fear of current or ex partner: Not on file    Emotionally abused: Not on file    Physically abused: Not on file    Forced sexual activity: Not on file  Other Topics Concern  . Not on  file  Social History Narrative   Exercise--no    REVIEW OF SYSTEMS: Constitutional: No fevers, chills, or sweats, no generalized fatigue, change in appetite Eyes: No visual changes, double vision, eye pain Ear, nose and throat: No hearing loss, ear pain, nasal congestion, sore throat Cardiovascular: No chest pain, palpitations Respiratory:  No shortness of breath at rest or with exertion, wheezes GastrointestinaI: No nausea, vomiting, diarrhea, abdominal pain, fecal incontinence Genitourinary:  No dysuria, urinary retention or frequency Musculoskeletal:  No neck pain, back pain Integumentary: No rash, pruritus, skin lesions Neurological: as above Psychiatric: No depression, insomnia, anxiety Endocrine: No palpitations, fatigue, diaphoresis, mood swings, change in appetite, change in weight, increased thirst Hematologic/Lymphatic:  No anemia, purpura, petechiae. Allergic/Immunologic: no itchy/runny eyes, nasal congestion, recent allergic reactions, rashes  PHYSICAL EXAM: Vitals:   03/15/18 1141  BP: 120/64  Pulse: 70  SpO2: 100%   General: No acute distress Head:  Normocephalic/atraumatic, no temporal tenderness or ropiness Neck: supple, no paraspinal tenderness, full range of motion Heart:  Regular rate and rhythm Lungs:  Clear to auscultation bilaterally Back: No paraspinal tenderness Skin/Extremities: No rash, no edema Neurological Exam: alert and oriented to person, place, and month/season. Year is 2008. No aphasia or dysarthria. Fund of knowledge is appropriate.  Remote memory intact. 0/3 delayed recall. Attention and concentration are normal.    Able to name objects and repeat phrases. CDT 4/5 MMSE - Mini Mental State Exam 03/15/2018 01/18/2018 10/31/2016  Orientation to time 2 5 5   Orientation to Place 5 5 5   Registration 3 3 3   Attention/ Calculation 5 5 5   Recall 0 0 0  Language- name 2 objects 2 2 2   Language- repeat 1 1 0  Language- follow 3 step command 3 2 3     Language- read & follow direction 1 1 1   Write a sentence 1 1  1  Copy design 1 0 1  Total score 24 25 26    Cranial nerves: Pupils equal, round, reactive to light.  Extraocular movements intact with no nystagmus. Visual fields full. Facial sensation intact. No facial asymmetry. Tongue, uvula, palate midline.  Motor: Bulk and tone normal, muscle strength: 5/5 throughout with no pronator drift.  Sensation intact to light touch. Deep tendon reflexes +1 throughout, toes downgoing.  Finger to nose testing intact.  Gait narrow-based and steady, able to tandem walk adequately.   IMPRESSION: This is a pleasant 72 yo RH woman with a history of hypertension, hyperlipidemia, hypothyroidism, with mild dementia, likely due to Alzheimer's disease. Family reported continued worsening on Aricept 10mg  daily, Namenda 10mg  BID was added. No side effects. MMSE today 24/30 (25/30 in May 2019, 26/30 in Feb 2018). She has had good response to gabapentin 300mg  qhs with less headaches as well. We had previously discussed driving, continue to monitor. Family was instructed to manage medications more closely. They were again encouraged to attend support groups and educational activities. She will follow-up in 6 months and knows to call for any changes.   Thank you for allowing me to participate in her care.  Please do not hesitate to call for any questions or concerns.  The duration of this appointment visit was 30 minutes of face-to-face time with the patient.  Greater than 50% of this time was spent in counseling, explanation of diagnosis, planning of further management, and coordination of care.   Ellouise Newer, M.D.   CC: Dr. Cheri Rous

## 2018-03-22 DIAGNOSIS — H0011 Chalazion right upper eyelid: Secondary | ICD-10-CM | POA: Diagnosis not present

## 2018-04-01 DIAGNOSIS — S92352A Displaced fracture of fifth metatarsal bone, left foot, initial encounter for closed fracture: Secondary | ICD-10-CM | POA: Diagnosis not present

## 2018-04-02 DIAGNOSIS — S92355A Nondisplaced fracture of fifth metatarsal bone, left foot, initial encounter for closed fracture: Secondary | ICD-10-CM | POA: Diagnosis not present

## 2018-05-03 DIAGNOSIS — S92302D Fracture of unspecified metatarsal bone(s), left foot, subsequent encounter for fracture with routine healing: Secondary | ICD-10-CM | POA: Diagnosis not present

## 2018-05-12 ENCOUNTER — Ambulatory Visit (INDEPENDENT_AMBULATORY_CARE_PROVIDER_SITE_OTHER): Payer: Medicare HMO | Admitting: Medical

## 2018-05-12 ENCOUNTER — Encounter: Payer: Self-pay | Admitting: Medical

## 2018-05-12 VITALS — BP 120/58 | HR 62 | Temp 97.8°F | Resp 16 | Ht 63.0 in | Wt 127.8 lb

## 2018-05-12 DIAGNOSIS — E559 Vitamin D deficiency, unspecified: Secondary | ICD-10-CM

## 2018-05-12 DIAGNOSIS — F0390 Unspecified dementia without behavioral disturbance: Secondary | ICD-10-CM | POA: Diagnosis not present

## 2018-05-12 DIAGNOSIS — I959 Hypotension, unspecified: Secondary | ICD-10-CM | POA: Diagnosis not present

## 2018-05-12 DIAGNOSIS — R5383 Other fatigue: Secondary | ICD-10-CM

## 2018-05-12 DIAGNOSIS — R69 Illness, unspecified: Secondary | ICD-10-CM | POA: Diagnosis not present

## 2018-05-12 LAB — POC URINALSYSI DIPSTICK (AUTOMATED)
Bilirubin, UA: NEGATIVE
Glucose, UA: NEGATIVE
KETONES UA: NEGATIVE
Leukocytes, UA: NEGATIVE
Nitrite, UA: NEGATIVE
PROTEIN UA: NEGATIVE
RBC UA: NEGATIVE
SPEC GRAV UA: 1.025 (ref 1.010–1.025)
Urobilinogen, UA: NEGATIVE E.U./dL — AB
pH, UA: 6 (ref 5.0–8.0)

## 2018-05-12 NOTE — Patient Instructions (Addendum)
For recent fatigue, will get CBC, CMP, TSH, vitamin D, B12 and B1 level.  Blood pressure readings have been low recently.  Low today's reading in the office a bit higher.  Will follow the above labs and let you know the results.  Make sure she is staying well-hydrated.  Based on her age and recent fatigue, I did think good idea to get a UA.  If any suspicious findings will also get culture.  I would recommend staying off of the valsartan.   Keep checking your blood pressures daily.  Make sure he will do is aware to be cautious standing up and getting her balance before ambulating.  Currently no lightheaded sensation on standing.  But if she has that please let us know.  Follow-up in 8 to 10 days or as needed.

## 2018-05-12 NOTE — Progress Notes (Signed)
Subjective:    Patient ID: Michelle [name], female    DOB: 03/22/1946, 72 y.o.   MRN: [id number]  HPI  Pt in for some recent mild low blood pressure. Readings since mid august 109/55, 115/58, 118/56, 93/48, 95/52, 108/81, 103/48, 92/49.   Pt was on valsartan 160 mg daily. But stopped about 2-3 weeks ago. BP are still low.   Pt has been little more tired recently.   Pt has dementia diagnosed 2 years ago.   No recent diarrhea or gi illness. No fever, no chills or sweats. But tends to be cold all the time.  Pt denies feeling light headed or dizzy on stadning  No black or bloody stools.   9 months ago she very minimal anemia.  Good kidney function.  Daughter states she does eat about twice a day. Brunch and dinner. Less snacking recently.    Review of Systems  Constitutional: Positive for fatigue. Negative for chills and fever.  HENT: Negative for congestion and drooling.   Respiratory: Negative for cough, chest tightness, shortness of breath and wheezing.   Cardiovascular: Negative for chest pain and palpitations.  Gastrointestinal: Negative for abdominal pain, constipation, diarrhea, nausea and vomiting.  Genitourinary: Negative for dysuria.  Musculoskeletal: Negative for back pain and joint swelling.  Skin: Negative for rash.  Neurological: Negative for dizziness, seizures, speech difficulty, weakness, numbness and headaches.  Hematological: Negative for adenopathy. Does not bruise/bleed easily.  Psychiatric/Behavioral: Negative for behavioral problems, confusion and sleep disturbance. The patient is not nervous/anxious.      Past Medical History:  Diagnosis Date  . Adenomatous colon polyp   . Allergy   . GERD (gastroesophageal reflux disease)   . Glaucoma   . Hx of hearing loss    bilateral hearing aids  . Hyperlipidemia   . Hypertension   . IBS (irritable bowel syndrome)   . Low back pain   . Thyroid disease      Social History   Socioeconomic History  .  Marital status: Married    Spouse name: Juanda Crumble, "Butch"  . Number of children: 2  . Years of education: Not on file  . Highest education level: Not on file  Occupational History  . Occupation: retired    Fish farm manager: UNEMPLOYED  Social Needs  . Financial resource strain: Not on file  . Food insecurity:    Worry: Not on file    Inability: Not on file  . Transportation needs:    Medical: Not on file    Non-medical: Not on file  Tobacco Use  . Smoking status: Never Smoker  . Smokeless tobacco: Never Used  Substance and Sexual Activity  . Alcohol use: No  . Drug use: No  . Sexual activity: Yes    Partners: Male    Birth control/protection: Surgical  Lifestyle  . Physical activity:    Days per week: Not on file    Minutes per session: Not on file  . Stress: Not on file  Relationships  . Social connections:    Talks on phone: Not on file    Gets together: Not on file    Attends religious service: Not on file    Active member of club or organization: Not on file    Attends meetings of clubs or organizations: Not on file    Relationship status: Not on file  . Intimate partner violence:    Fear of current or ex partner: Not on file    Emotionally abused: Not on file  Physically abused: Not on file    Forced sexual activity: Not on file  Other Topics Concern  . Not on file  Social History Narrative   Exercise--no    Past Surgical History:  Procedure Laterality Date  . ABDOMINAL HYSTERECTOMY  1998  . CATARACT EXTRACTION, BILATERAL Bilateral 04/2015  . CHOLECYSTECTOMY  1998  . [place] / 2013   skin graft on gums  . TUBAL LIGATION  1977    Family History  Problem Relation Age of Onset  . Macular degeneration Father   . Prostate cancer Father   . Hypertension Father   . Cancer Father        prostate  . Glaucoma Mother   . Emphysema Mother   . Hypertension Mother   . Other Brother        Brain tumor  . Cancer Brother 54       brain tumor  . Breast  cancer Maternal Grandmother   . Prostate cancer Brother     Allergies  Allergen Reactions  . No Known Allergies     Current Outpatient Medications on File Prior to Visit  Medication Sig Dispense Refill  . ALPHAGAN P 0.1 % SOLN     . AMBULATORY NON FORMULARY MEDICATION IBgard: Takes prn    . aspirin 81 MG tablet Take 81 mg by mouth daily.     . benzonatate (TESSALON) 200 MG capsule Take 1 capsule (200 mg total) 3 (three) times daily as needed by mouth for cough. 90 capsule 1  . donepezil (ARICEPT) 10 MG tablet Take 1 tablet daily 90 tablet 3  . fluticasone (FLONASE) 50 MCG/ACT nasal spray Place 2 sprays into both nostrils daily. 16 g 5  . gabapentin (NEURONTIN) 300 MG capsule Take 1 capsule daily 90 capsule 3  . levothyroxine (SYNTHROID, LEVOTHROID) 50 MCG tablet TAKE 1 TABLET BY MOUTH EVERY DAY 30 tablet 3  . loratadine (CLARITIN) 10 MG tablet Take 1 tablet (10 mg total) by mouth daily. 90 tablet 1  . memantine (NAMENDA) 10 MG tablet Take 1 tablet (10 mg total) by mouth 2 (two) times daily. 180 tablet 3  . Multiple Vitamin (MULTIVITAMIN) tablet Take 1 tablet by mouth daily.    . pravastatin (PRAVACHOL) 40 MG tablet TAKE 1 TABLET BY MOUTH EVERY DAY 90 tablet 0  . sertraline (ZOLOFT) 50 MG tablet TAKE 1 TABLET (50 MG TOTAL) DAILY BY MOUTH. 90 tablet 0  . valsartan (DIOVAN) 160 MG tablet TAKE 1 TABLET BY MOUTH DAILY (Patient not taking: Reported on 05/12/2018) 90 tablet 1   Current Facility-Administered Medications on File Prior to Visit  Medication Dose Route Frequency Provider Last Rate Last Dose  . 0.9 %  sodium chloride infusion  500 mL Intravenous Continuous Irene Shipper, MD        BP (!) 120/58 Comment: second check 125/50  Pulse 62   Temp 97.8 F (36.6 C) (Oral)   Resp 16   Ht 5\' 3"  (1.6 m)   Wt 127 lb 12.8 oz (58 kg)   SpO2 100%   BMI 22.64 kg/m       Objective:   Physical Exam  General Mental Status- Alert. General Appearance- Not in acute distress.    Skin General: Color- Normal Color. Moisture- Normal Moisture.  Neck Carotid Arteries- Normal color. Moisture- Normal Moisture. No carotid bruits. No JVD.  Chest and Lung Exam Auscultation: Breath Sounds:-Normal.  Cardiovascular Auscultation:Rythm- Regular. Murmurs & Other Heart Sounds:Auscultation of the heart reveals-  No Murmurs.  Abdomen Inspection:-Inspeection Normal. Palpation/Percussion:Note:No mass. Palpation and Percussion of the abdomen reveal- Non Tender, Non Distended + BS, no rebound or guarding.   Neurologic Cranial Nerve exam:- CN III-XII intact(No nystagmus), symmetric smile. Finger to Nose:- Normal/Intact Strength:- 5/5 equal and symmetric strength both upper and lower extremities.      Assessment & Plan:  For recent fatigue, will get CBC, CMP, TSH, vitamin D, B12 and B1 level.  Blood pressure readings have been low recently.  Low today's reading in the office a bit higher.  Will follow the above labs and let you know the results.  Make sure she is staying well-hydrated.  Based on her age and recent fatigue, I did think good idea to get a UA.  If any suspicious findings will also get culture.  I would recommend staying off of the valsartan.   Keep checking your blood pressures daily.  Make sure he will do is aware to be cautious standing up and getting her balance before ambulating.  Currently no lightheaded sensation on standing.  But if she has that please let us know.  Follow-up in 8 to 10 days or as needed.  Mackie Pai, PA-C

## 2018-05-13 LAB — CBC WITH DIFFERENTIAL/PLATELET
BASOS PCT: 0.7 % (ref 0.0–3.0)
Basophils Absolute: 0 10*3/uL (ref 0.0–0.1)
EOS PCT: 2.2 % (ref 0.0–5.0)
Eosinophils Absolute: 0.1 10*3/uL (ref 0.0–0.7)
HEMATOCRIT: 34.4 % — AB (ref 36.0–46.0)
HEMOGLOBIN: 11.5 g/dL — AB (ref 12.0–15.0)
LYMPHS PCT: 22.6 % (ref 12.0–46.0)
Lymphs Abs: 1.4 10*3/uL (ref 0.7–4.0)
MCHC: 33.5 g/dL (ref 30.0–36.0)
MCV: 88.4 fl (ref 78.0–100.0)
MONO ABS: 0.5 10*3/uL (ref 0.1–1.0)
Monocytes Relative: 8.3 % (ref 3.0–12.0)
NEUTROS ABS: 4.2 10*3/uL (ref 1.4–7.7)
Neutrophils Relative %: 66.2 % (ref 43.0–77.0)
Platelets: 208 10*3/uL (ref 150.0–400.0)
RBC: 3.9 Mil/uL (ref 3.87–5.11)
RDW: 13 % (ref 11.5–15.5)
WBC: 6.3 10*3/uL (ref 4.0–10.5)

## 2018-05-13 LAB — COMPREHENSIVE METABOLIC PANEL
ALK PHOS: 58 U/L (ref 39–117)
ALT: 19 U/L (ref 0–35)
AST: 22 U/L (ref 0–37)
Albumin: 4.2 g/dL (ref 3.5–5.2)
BILIRUBIN TOTAL: 0.4 mg/dL (ref 0.2–1.2)
BUN: 17 mg/dL (ref 6–23)
CALCIUM: 9.5 mg/dL (ref 8.4–10.5)
CO2: 32 meq/L (ref 19–32)
Chloride: 104 mEq/L (ref 96–112)
Creatinine, Ser: 0.78 mg/dL (ref 0.40–1.20)
GFR: 77.11 mL/min (ref >60.00)
Glucose, Bld: 84 mg/dL (ref 70–99)
POTASSIUM: 5.3 meq/L — AB (ref 3.5–5.1)
Sodium: 142 mEq/L (ref 135–145)
TOTAL PROTEIN: 6.7 g/dL (ref 6.0–8.3)

## 2018-05-13 LAB — VITAMIN B12: VITAMIN B 12: 238 pg/mL (ref 211–911)

## 2018-05-13 LAB — TSH: TSH: 3.44 u[IU]/mL (ref 0.35–4.50)

## 2018-05-13 LAB — VITAMIN D 25 HYDROXY (VIT D DEFICIENCY, FRACTURES): VITD: 28.76 ng/mL — AB (ref 30.00–100.00)

## 2018-05-16 LAB — VITAMIN B1: VITAMIN B1 (THIAMINE): 22 nmol/L (ref 8–30)

## 2018-05-18 ENCOUNTER — Telehealth: Payer: Self-pay | Admitting: *Deleted

## 2018-05-18 NOTE — Telephone Encounter (Signed)
Received [facility] [place] Directive; forwarded to provider for [place] paperwork; will have scanned in chart afterwards/SLS 09/10

## 2018-05-21 ENCOUNTER — Ambulatory Visit (INDEPENDENT_AMBULATORY_CARE_PROVIDER_SITE_OTHER): Payer: Medicare HMO | Admitting: Family Medicine

## 2018-05-21 ENCOUNTER — Encounter: Payer: Self-pay | Admitting: Family Medicine

## 2018-05-21 VITALS — BP 106/52 | HR 62 | Temp 98.0°F | Resp 18 | Ht 63.0 in | Wt 128.4 lb

## 2018-05-21 DIAGNOSIS — I10 Essential (primary) hypertension: Secondary | ICD-10-CM | POA: Diagnosis not present

## 2018-05-21 DIAGNOSIS — E039 Hypothyroidism, unspecified: Secondary | ICD-10-CM | POA: Diagnosis not present

## 2018-05-21 DIAGNOSIS — Z23 Encounter for immunization: Secondary | ICD-10-CM | POA: Diagnosis not present

## 2018-05-21 DIAGNOSIS — E785 Hyperlipidemia, unspecified: Secondary | ICD-10-CM

## 2018-05-21 DIAGNOSIS — R059 Cough, unspecified: Secondary | ICD-10-CM

## 2018-05-21 DIAGNOSIS — R05 Cough: Secondary | ICD-10-CM | POA: Diagnosis not present

## 2018-05-21 LAB — LIPID PANEL
CHOLESTEROL: 141 mg/dL (ref 0–200)
HDL: 65.8 mg/dL (ref >39.00)
LDL Cholesterol: 61 mg/dL (ref 0–99)
NonHDL: 74.87
Total CHOL/HDL Ratio: 2
Triglycerides: 67 mg/dL (ref 0.0–149.0)
VLDL: 13.4 mg/dL (ref 0.0–40.0)

## 2018-05-21 MED ORDER — PRAVASTATIN SODIUM 20 MG PO TABS: 20.0000 mg | ORAL_TABLET | Freq: Every day | ORAL | 3 refills | Status: DC

## 2018-05-21 MED ORDER — BENZONATATE 200 MG PO CAPS: 200.0000 mg | ORAL_CAPSULE | Freq: Three times a day (TID) | ORAL | 1 refills | Status: DC | PRN

## 2018-05-21 MED ORDER — VALSARTAN 80 MG PO TABS
80.0000 mg | ORAL_TABLET | Freq: Every day | ORAL | 2 refills | Status: DC
Start: 2018-05-21 — End: 2018-07-11

## 2018-05-21 MED ORDER — LEVOTHYROXINE SODIUM 50 MCG PO TABS: 50.0000 ug | ORAL_TABLET | Freq: Every day | ORAL | 3 refills | Status: DC

## 2018-05-21 NOTE — Progress Notes (Signed)
Subjective:  I acted as a Education administrator for Dr. Rosemary Holms, RMA  Patient ID: Michelle [name], female    DOB: 1946/02/13, 72 y.o.   MRN: [id number]  Chief Complaint  Patient presents with  . Follow-up    Bllod pressure     HPI  Patient is in today for a blood pressure follow up. She has no acute concerns.  She is accompanied with her daughter today. She has been recording her BP readings at home and states she is doing well. No recent febrile illness or acute hospitalizations. Denies CP/palp/SOB/HA/congestion/fevers/GI or GU c/o. Taking meds as prescribed    Patient Care Team: Carollee Herter, Alferd Apa, DO as PCP - General Calvert Cantor, MD as Consulting Physician (Ophthalmology) Irene Shipper, MD as Consulting Physician (Gastroenterology) Cameron Sprang, MD as Consulting Physician (Neurology)   Past Medical History:  Diagnosis Date  . Adenomatous colon polyp   . Allergy   . GERD (gastroesophageal reflux disease)   . Glaucoma   . Hx of hearing loss    bilateral hearing aids  . Hyperlipidemia   . Hypertension   . IBS (irritable bowel syndrome)   . Low back pain   . Thyroid disease     Past Surgical History:  Procedure Laterality Date  . ABDOMINAL HYSTERECTOMY  1998  . CATARACT EXTRACTION, BILATERAL Bilateral 04/2015  . CHOLECYSTECTOMY  1998  . [place] / 2013   skin graft on gums  . TUBAL LIGATION  1977    Family History  Problem Relation Age of Onset  . Macular degeneration Father   . Prostate cancer Father   . Hypertension Father   . Cancer Father        prostate  . Glaucoma Mother   . Emphysema Mother   . Hypertension Mother   . Other Brother        Brain tumor  . Cancer Brother 70       brain tumor  . Breast cancer Maternal Grandmother   . Prostate cancer Brother     Social History   Socioeconomic History  . Marital status: Married    Spouse name: Juanda Crumble, "Butch"  . Number of children: 2  . Years of education: Not on file  . Highest  education level: Not on file  Occupational History  . Occupation: retired    Fish farm manager: UNEMPLOYED  Social Needs  . Financial resource strain: Not on file  . Food insecurity:    Worry: Not on file    Inability: Not on file  . Transportation needs:    Medical: Not on file    Non-medical: Not on file  Tobacco Use  . Smoking status: Never Smoker  . Smokeless tobacco: Never Used  Substance and Sexual Activity  . Alcohol use: No  . Drug use: No  . Sexual activity: Yes    Partners: Male    Birth control/protection: Surgical  Lifestyle  . Physical activity:    Days per week: Not on file    Minutes per session: Not on file  . Stress: Not on file  Relationships  . Social connections:    Talks on phone: Not on file    Gets together: Not on file    Attends religious service: Not on file    Active member of club or organization: Not on file    Attends meetings of clubs or organizations: Not on file    Relationship status: Not on file  . Intimate partner  violence:    Fear of current or ex partner: Not on file    Emotionally abused: Not on file    Physically abused: Not on file    Forced sexual activity: Not on file  Other Topics Concern  . Not on file  Social History Narrative   Exercise--no    Outpatient Medications Prior to Visit  Medication Sig Dispense Refill  . ALPHAGAN P 0.1 % SOLN     . AMBULATORY NON FORMULARY MEDICATION IBgard: Takes prn    . aspirin 81 MG tablet Take 81 mg by mouth daily.     Marland Kitchen donepezil (ARICEPT) 10 MG tablet Take 1 tablet daily 90 tablet 3  . fluticasone (FLONASE) 50 MCG/ACT nasal spray Place 2 sprays into both nostrils daily. 16 g 5  . gabapentin (NEURONTIN) 300 MG capsule Take 1 capsule daily 90 capsule 3  . loratadine (CLARITIN) 10 MG tablet Take 1 tablet (10 mg total) by mouth daily. 90 tablet 1  . memantine (NAMENDA) 10 MG tablet Take 1 tablet (10 mg total) by mouth 2 (two) times daily. 180 tablet 3  . Multiple Vitamin (MULTIVITAMIN) tablet  Take 1 tablet by mouth daily.    . sertraline (ZOLOFT) 50 MG tablet TAKE 1 TABLET (50 MG TOTAL) DAILY BY MOUTH. 90 tablet 0  . benzonatate (TESSALON) 200 MG capsule Take 1 capsule (200 mg total) 3 (three) times daily as needed by mouth for cough. 90 capsule 1  . levothyroxine (SYNTHROID, LEVOTHROID) 50 MCG tablet TAKE 1 TABLET BY MOUTH EVERY DAY 30 tablet 3  . pravastatin (PRAVACHOL) 40 MG tablet TAKE 1 TABLET BY MOUTH EVERY DAY 90 tablet 0  . valsartan (DIOVAN) 160 MG tablet TAKE 1 TABLET BY MOUTH DAILY 90 tablet 1  . 0.9 %  sodium chloride infusion      No facility-administered medications prior to visit.     Allergies  Allergen Reactions  . No Known Allergies     Review of Systems  Constitutional: Negative for chills, fever and malaise/fatigue.  HENT: Negative for congestion and hearing loss.   Eyes: Negative for discharge.  Respiratory: Negative for cough, sputum production and shortness of breath.   Cardiovascular: Negative for chest pain, palpitations and leg swelling.  Gastrointestinal: Negative for abdominal pain, blood in stool, constipation, diarrhea, heartburn, nausea and vomiting.  Genitourinary: Negative for dysuria, frequency, hematuria and urgency.  Musculoskeletal: Negative for back pain, falls and myalgias.  Skin: Negative for rash.  Neurological: Negative for dizziness, sensory change, loss of consciousness, weakness and headaches.  Endo/Heme/Allergies: Negative for environmental allergies. Does not bruise/bleed easily.  Psychiatric/Behavioral: Positive for memory loss. Negative for depression and suicidal ideas. The patient is not nervous/anxious and does not have insomnia.        Objective:    Physical Exam  Constitutional: She appears well-developed and well-nourished.  HENT:  Head: Normocephalic and atraumatic.  Eyes: Conjunctivae and EOM are normal.  Neck: Normal range of motion. Neck supple. No JVD present. Carotid bruit is not present. No thyromegaly  present.  Cardiovascular: Normal rate, regular rhythm and normal heart sounds.  No murmur heard. Pulmonary/Chest: Effort normal and breath sounds normal. No respiratory distress. She has no wheezes. She has no rales. She exhibits no tenderness.  Musculoskeletal: She exhibits no edema.  Neurological: She is alert.  Pt under the care of a neuro for memory loss  Psychiatric: She has a normal mood and affect.  Nursing note and vitals reviewed.   BP (!) 106/52 (BP  Location: Left Arm, Patient Position: Sitting, Cuff Size: Normal)   Pulse 62   Temp 98 F (36.7 C) (Oral)   Resp 18   Ht 5\' 3"  (1.6 m)   Wt 128 lb 6.4 oz (58.2 kg)   SpO2 100%   BMI 22.75 kg/m  Wt Readings from Last 3 Encounters:  05/21/18 128 lb 6.4 oz (58.2 kg)  05/12/18 127 lb 12.8 oz (58 kg)  03/15/18 125 lb (56.7 kg)   BP Readings from Last 3 Encounters:  05/21/18 (!) 106/52  05/12/18 (!) 120/58  03/15/18 120/64     Immunization History  Administered Date(s) Administered  . Influenza Split 09/23/2011  . Influenza Whole 07/26/2007, 06/30/2008, 07/26/2009, 09/12/2010  . Influenza, High Dose Seasonal PF 06/15/2013, 10/05/2015, 07/20/2017, 05/21/2018  . Influenza,inj,Quad PF,6+ Mos 06/19/2014, 10/02/2016  . Pneumococcal Conjugate-13 11/03/2014  . Pneumococcal Polysaccharide-23 09/23/2011  . Td 07/02/2005  . Zoster 11/14/2008    Health Maintenance  Topic Date Due  . Hepatitis C Screening  01/25/1946  . TETANUS/TDAP  07/03/2015  . PAP SMEAR  10/31/2018  . COLONOSCOPY  12/11/2019  . MAMMOGRAM  01/26/2020  . INFLUENZA VACCINE  Completed  . DEXA SCAN  Completed  . PNA vac Low Risk Adult  Completed    Lab Results  Component Value Date   WBC 6.3 05/12/2018   HGB 11.5 (L) 05/12/2018   HCT 34.4 (L) 05/12/2018   PLT 208.0 05/12/2018   GLUCOSE 84 05/12/2018   CHOL 141 05/21/2018   TRIG 67.0 05/21/2018   HDL 65.80 05/21/2018   LDLCALC 61 05/21/2018   ALT 19 05/12/2018   AST 22 05/12/2018   NA 142  05/12/2018   K 5.3 (H) 05/12/2018   CL 104 05/12/2018   CREATININE 0.78 05/12/2018   BUN 17 05/12/2018   CO2 32 05/12/2018   TSH 3.44 05/12/2018   MICROALBUR 1.0 11/03/2014    Lab Results  Component Value Date   TSH 3.44 05/12/2018   Lab Results  Component Value Date   WBC 6.3 05/12/2018   HGB 11.5 (L) 05/12/2018   HCT 34.4 (L) 05/12/2018   MCV 88.4 05/12/2018   PLT 208.0 05/12/2018   Lab Results  Component Value Date   NA 142 05/12/2018   K 5.3 (H) 05/12/2018   CO2 32 05/12/2018   GLUCOSE 84 05/12/2018   BUN 17 05/12/2018   CREATININE 0.78 05/12/2018   BILITOT 0.4 05/12/2018   ALKPHOS 58 05/12/2018   AST 22 05/12/2018   ALT 19 05/12/2018   PROT 6.7 05/12/2018   ALBUMIN 4.2 05/12/2018   CALCIUM 9.5 05/12/2018   ANIONGAP 9 05/09/2017   GFR 77.11 05/12/2018   Lab Results  Component Value Date   CHOL 141 05/21/2018   Lab Results  Component Value Date   HDL 65.80 05/21/2018   Lab Results  Component Value Date   LDLCALC 61 05/21/2018   Lab Results  Component Value Date   TRIG 67.0 05/21/2018   Lab Results  Component Value Date   CHOLHDL 2 05/21/2018   No results found for: HGBA1C       Assessment & Plan:   Problem List Items Addressed This Visit      Unprioritized   COUGH   Relevant Medications   benzonatate (TESSALON) 200 MG capsule   Essential hypertension    Well controlled, no changes to meds. Encouraged heart healthy diet such as the DASH diet and exercise as tolerated.       Relevant Medications  valsartan (DIOVAN) 80 MG tablet   pravastatin (PRAVACHOL) 20 MG tablet   Hyperlipidemia    Encouraged heart healthy diet, increase exercise, avoid trans fats, consider a krill oil cap daily      Relevant Medications   valsartan (DIOVAN) 80 MG tablet   pravastatin (PRAVACHOL) 20 MG tablet   Other Relevant Orders   Lipid panel (Completed)   Lipid panel   Comprehensive metabolic panel   Hypothyroidism    Check labs Con' meds   stable      Relevant Medications   levothyroxine (SYNTHROID, LEVOTHROID) 50 MCG tablet    Other Visit Diagnoses    Needs flu shot    -  Primary   Relevant Orders   Flu vaccine HIGH DOSE PF (Fluzone High dose) (Completed)      I have discontinued Lataisha W. Kraai's valsartan and pravastatin. I have also changed her benzonatate and levothyroxine. Additionally, I am having her start on valsartan and pravastatin. Lastly, I am having her maintain her aspirin, multivitamin, ALPHAGAN P, AMBULATORY NON FORMULARY MEDICATION, fluticasone, loratadine, sertraline, donepezil, gabapentin, and memantine. We will stop administering sodium chloride.  Meds ordered this encounter  Medications  . benzonatate (TESSALON) 200 MG capsule    Sig: Take 1 capsule (200 mg total) by mouth 3 (three) times daily as needed for cough.    Dispense:  90 capsule    Refill:  1  . levothyroxine (SYNTHROID, LEVOTHROID) 50 MCG tablet    Sig: Take 1 tablet (50 mcg total) by mouth daily.    Dispense:  30 tablet    Refill:  3  . valsartan (DIOVAN) 80 MG tablet    Sig: Take 1 tablet (80 mg total) by mouth daily.    Dispense:  30 tablet    Refill:  2  . pravastatin (PRAVACHOL) 20 MG tablet    Sig: Take 1 tablet (20 mg total) by mouth daily.    Dispense:  90 tablet    Refill:  3    CMA served as scribe during this visit. History, Physical and Plan performed by medical provider. Documentation and orders reviewed and attested to.  Ann Held, DO

## 2018-05-21 NOTE — Patient Instructions (Signed)

## 2018-05-22 NOTE — Assessment & Plan Note (Signed)
Encouraged heart healthy diet, increase exercise, avoid trans fats, consider a krill oil cap daily 

## 2018-05-22 NOTE — Assessment & Plan Note (Signed)
Check labs Con' meds  stable

## 2018-05-22 NOTE — Assessment & Plan Note (Signed)
Well controlled, no changes to meds. Encouraged heart healthy diet such as the DASH diet and exercise as tolerated.  °

## 2018-06-04 ENCOUNTER — Ambulatory Visit (INDEPENDENT_AMBULATORY_CARE_PROVIDER_SITE_OTHER): Payer: Medicare HMO | Admitting: Family Medicine

## 2018-06-04 VITALS — BP 121/63 | HR 64

## 2018-06-04 DIAGNOSIS — I10 Essential (primary) hypertension: Secondary | ICD-10-CM

## 2018-06-04 NOTE — Progress Notes (Signed)
Pre visit review using our clinic tool,if applicable. No additional management support is needed unless otherwise documented below in the visit note.   Pt here for Blood pressure check per order from Dr Carollee Herter dated 05/21/18.  Pt currently takes:Valsartan 160 mg daily. No complaints voiced this visit.   Patient has not had BP medication today. States she takes around mid day.     Pt reports compliance with medication.  BP today  = 121/63     Home Monitor   B/P=129/63  P=64 P =64  Pt advised per Dr.Lowne-Chase to continue taking BP medication as ordered and return in 3 months for follow up. Patient has appointment scheduled for Jan 2020.

## 2018-07-11 ENCOUNTER — Other Ambulatory Visit: Payer: Self-pay | Admitting: Family Medicine

## 2018-07-11 DIAGNOSIS — I10 Essential (primary) hypertension: Secondary | ICD-10-CM

## 2018-07-14 DIAGNOSIS — H401132 Primary open-angle glaucoma, bilateral, moderate stage: Secondary | ICD-10-CM | POA: Diagnosis not present

## 2018-07-14 DIAGNOSIS — H10413 Chronic giant papillary conjunctivitis, bilateral: Secondary | ICD-10-CM | POA: Diagnosis not present

## 2018-07-21 ENCOUNTER — Other Ambulatory Visit (INDEPENDENT_AMBULATORY_CARE_PROVIDER_SITE_OTHER): Payer: Medicare HMO

## 2018-07-21 DIAGNOSIS — E785 Hyperlipidemia, unspecified: Secondary | ICD-10-CM

## 2018-07-21 LAB — COMPREHENSIVE METABOLIC PANEL
ALT: 23 U/L (ref 0–35)
AST: 24 U/L (ref 0–37)
Albumin: 4.2 g/dL (ref 3.5–5.2)
Alkaline Phosphatase: 58 U/L (ref 39–117)
BUN: 20 mg/dL (ref 6–23)
CHLORIDE: 104 meq/L (ref 96–112)
CO2: 34 meq/L — AB (ref 19–32)
CREATININE: 0.74 mg/dL (ref 0.40–1.20)
Calcium: 9.3 mg/dL (ref 8.4–10.5)
GFR: 81.9 mL/min (ref >60.00)
GLUCOSE: 95 mg/dL (ref 70–99)
POTASSIUM: 4.6 meq/L (ref 3.5–5.1)
SODIUM: 142 meq/L (ref 135–145)
Total Bilirubin: 0.5 mg/dL (ref 0.2–1.2)
Total Protein: 6.6 g/dL (ref 6.0–8.3)

## 2018-07-21 LAB — LIPID PANEL
CHOL/HDL RATIO: 2
Cholesterol: 155 mg/dL (ref 0–200)
HDL: 68.3 mg/dL (ref >39.00)
LDL CALC: 74 mg/dL (ref 0–99)
NonHDL: 86.4
Triglycerides: 63 mg/dL (ref 0.0–149.0)
VLDL: 12.6 mg/dL (ref 0.0–40.0)

## 2018-08-03 ENCOUNTER — Encounter: Payer: Self-pay | Admitting: Family Medicine

## 2018-08-03 ENCOUNTER — Ambulatory Visit (INDEPENDENT_AMBULATORY_CARE_PROVIDER_SITE_OTHER): Payer: Medicare HMO | Admitting: Family Medicine

## 2018-08-03 VITALS — BP 118/69 | HR 67 | Temp 98.4°F | Resp 16 | Ht 63.0 in | Wt 128.0 lb

## 2018-08-03 DIAGNOSIS — L600 Ingrowing nail: Secondary | ICD-10-CM

## 2018-08-03 DIAGNOSIS — D229 Melanocytic nevi, unspecified: Secondary | ICD-10-CM

## 2018-08-03 MED ORDER — HYDROCODONE-ACETAMINOPHEN 5-325 MG PO TABS
1.0000 | ORAL_TABLET | Freq: Four times a day (QID) | ORAL | 0 refills | Status: DC | PRN
Start: 2018-08-03 — End: 2020-02-03

## 2018-08-03 MED ORDER — CEPHALEXIN 500 MG PO CAPS
500.0000 mg | ORAL_CAPSULE | Freq: Two times a day (BID) | ORAL | 0 refills | Status: DC
Start: 2018-08-03 — End: 2018-09-21

## 2018-08-03 MED FILL — CEPHALEXIN 500 MG CAPSULE: 500 | 7 days supply | Qty: 14 | Fill #0

## 2018-08-03 MED FILL — HYDROCODON-APAP 5-325: 5-325 | 7 days supply | Qty: 30 | Fill #0

## 2018-08-03 NOTE — Patient Instructions (Signed)
Fingernail or Toenail Removal, Adult, Care After  This sheet gives you information about how to care for yourself after your procedure. Your health care provider may also give you more specific instructions. If you have problems or questions, contact your health care provider.  What can I expect after the procedure?  After the procedure, it is common to have:  · Pain.  · Redness.  · Swelling.  · Soreness.    Follow these instructions at home:  · If you have a splint:  ? Do not put pressure on any part of the splint until it is fully hardened. This may take several hours.  ? Wear the splint as told by your health care provider. Remove it only as told by your health care provider.  ? Loosen the splint if your fingers or toes tingle, become numb, or turn cold and blue.  ? Keep the splint clean.  ? If the splint is not waterproof:  § Do not let it get wet.  § Cover it with a watertight covering when you take a bath or a shower.  Wound care    · Follow instructions from your health care provider about how to take care of your wound. Make sure you:  ? Wash your hands with soap and water before you change your bandage (dressing). If soap and water are not available, use hand sanitizer.  ? Change your dressing as told by your health care provider.  ? Keep your dressing dry until your health care provider says it can be removed.  ? Leave stitches (sutures), skin glue, or adhesive strips in place. These skin closures may need to stay in place for 2 weeks or longer. If adhesive strip edges start to loosen and curl up, you may trim the loose edges. Do not remove adhesive strips completely unless your health care provider tells you to do that.  · Check your wound every day for signs of infection. Check for:  ? More redness, swelling, or pain.  ? More fluid or blood.  ? Warmth.  ? Pus or a bad smell.  Managing pain, stiffness, and swelling  · Move your fingers or toes often to avoid stiffness and to lessen swelling.  · Raise  (elevate) the injured area above the level of your heart while you are sitting or lying down. You may need to keep your finger or toe raised or supported on a pillow for 24 hours or as told by your health care provider.  · Soak your hand or foot in warm, soapy water for 10-20 minutes, 3 times a day or as told by your health care provider.  Medicine  · Take over-the-counter and prescription medicines only as told by your health care provider.  · If you were prescribed an antibiotic medicine, use it as told by your health care provider. Do not stop using the antibiotic even if your condition improves.  General instructions  · If you were given a shoe to wear, wear it as told by your health care provider.  · Keep all follow-up visits as told by your health care provider. This is important.  Contact a health care provider if:  · You have more redness, swelling, or pain around your wound.  · You have more fluid or blood coming from your wound.  · Your wound feels warm to the touch.  · You have pus or a bad smell coming from your wound.  · You have a fever.  · Your   finger or toe looks blue or black.  This information is not intended to replace advice given to you by your health care provider. Make sure you discuss any questions you have with your health care provider.  Document Released: 09/15/2014 Document Revised: 04/23/2016 Document Reviewed: 03/03/2016  [employer] Interactive Patient Education © 2018 [employer]

## 2018-08-03 NOTE — Progress Notes (Signed)
Patient ID: Michelle [name], female    DOB: [birth year]  Age: 72 y.o. MRN: [id number]    Subjective:  Subjective  HPI Michelle [name] presents for nail separating from toe-- she would like the toenail to be removed.   She also has a spot on her L shoulder that is not healing well.  Its been there 3 months  Review of Systems  Constitutional: Negative for activity change, appetite change, fatigue and unexpected weight change.  Respiratory: Negative for cough and shortness of breath.   Cardiovascular: Negative for chest pain and palpitations.  Skin: Positive for wound.       Nail broken and separated from toe  Psychiatric/Behavioral: Negative for behavioral problems and dysphoric mood. The patient is not nervous/anxious.     History Past Medical History:  Diagnosis Date  . Adenomatous colon polyp   . Allergy   . GERD (gastroesophageal reflux disease)   . Glaucoma   . Hx of hearing loss    bilateral hearing aids  . Hyperlipidemia   . Hypertension   . IBS (irritable bowel syndrome)   . Low back pain   . Thyroid disease     She has a past surgical history that includes Tubal ligation (1977); Cholecystectomy (1998); Abdominal hysterectomy (1998); Gum surgery (1996 / 2013); and Cataract extraction, bilateral (Bilateral, 04/2015).   Her family history includes Breast cancer in her maternal grandmother; Cancer in her father; Cancer (age of onset: 65) in her brother; Emphysema in her mother; Glaucoma in her mother; Hypertension in her father and mother; Macular degeneration in her father; Other in her brother; Prostate cancer in her brother and father.She reports that she has never smoked. She has never used smokeless tobacco. She reports that she does not drink alcohol or use drugs.  Current Outpatient Medications on File Prior to Visit  Medication Sig Dispense Refill  . ALPHAGAN P 0.1 % SOLN     . AMBULATORY NON FORMULARY MEDICATION IBgard: Takes prn    . aspirin 81 MG tablet Take 81  mg by mouth daily.     . benzonatate (TESSALON) 200 MG capsule Take 1 capsule (200 mg total) by mouth 3 (three) times daily as needed for cough. 90 capsule 1  . donepezil (ARICEPT) 10 MG tablet Take 1 tablet daily 90 tablet 3  . fluticasone (FLONASE) 50 MCG/ACT nasal spray Place 2 sprays into both nostrils daily. 16 g 5  . gabapentin (NEURONTIN) 300 MG capsule Take 1 capsule daily 90 capsule 3  . levothyroxine (SYNTHROID, LEVOTHROID) 50 MCG tablet Take 1 tablet (50 mcg total) by mouth daily. 30 tablet 3  . loratadine (CLARITIN) 10 MG tablet Take 1 tablet (10 mg total) by mouth daily. 90 tablet 1  . memantine (NAMENDA) 10 MG tablet Take 1 tablet (10 mg total) by mouth 2 (two) times daily. 180 tablet 3  . Multiple Vitamin (MULTIVITAMIN) tablet Take 1 tablet by mouth daily.    . pravastatin (PRAVACHOL) 20 MG tablet Take 1 tablet (20 mg total) by mouth daily. 90 tablet 3  . sertraline (ZOLOFT) 50 MG tablet TAKE 1 TABLET (50 MG TOTAL) DAILY BY MOUTH. 90 tablet 0  . valsartan (DIOVAN) 80 MG tablet TAKE 1 TABLET BY MOUTH EVERY DAY 30 tablet 1   No current facility-administered medications on file prior to visit.      Objective:  Objective  Physical Exam  Skin: Lesion noted. No rash noted.     R toenail ---   Broken in  half and top halp hanging by medial side Pt would like the nail removed Rubber band placed as tourniquet Toe cleaned with betadine  1 cc lidocaine without epi injected to anesthetize toe  Then forceps and nail clipper used to remove toenail Pressure bandage place   Nursing note and vitals reviewed.  BP 118/69 (BP Location: Right Arm, Patient Position: Sitting, Cuff Size: Small)   Pulse 67   Temp 98.4 F (36.9 C) (Oral)   Resp 16   Ht 5\' 3"  (1.6 m)   Wt 128 lb (58.1 kg)   SpO2 100%   BMI 22.67 kg/m  Wt Readings from Last 3 Encounters:  08/03/18 128 lb (58.1 kg)  05/21/18 128 lb 6.4 oz (58.2 kg)  05/12/18 127 lb 12.8 oz (58 kg)     Lab Results  Component Value  Date   WBC 6.3 05/12/2018   HGB 11.5 (L) 05/12/2018   HCT 34.4 (L) 05/12/2018   PLT 208.0 05/12/2018   GLUCOSE 95 07/21/2018   CHOL 155 07/21/2018   TRIG 63.0 07/21/2018   HDL 68.30 07/21/2018   LDLCALC 74 07/21/2018   ALT 23 07/21/2018   AST 24 07/21/2018   NA 142 07/21/2018   K 4.6 07/21/2018   CL 104 07/21/2018   CREATININE 0.74 07/21/2018   [name] 20 07/21/2018   CO2 34 (H) 07/21/2018   TSH 3.44 05/12/2018   MICROALBUR 1.0 11/03/2014    Dg Bone Density  Result Date: 01/25/2018 EXAM: DUAL X-RAY ABSORPTIOMETRY (DXA) FOR BONE MINERAL DENSITY IMPRESSION: Michelle [name] Your patient Michelle [name] completed a BMD test on 01/25/2018 using the [employer] (analysis version: 16.SP2) manufactured by [employer]. The following summarizes the results of our evaluation. PATIENT: Name: Michelle, [name] Patient ID: [id number] Birth Date: 02-07-1946 Height: 62.7 in. Gender: Female Measured: 01/25/2018 Weight: 120.8 lbs. Indications: Advanced Age, Caucasian, Estrogen Deficiency, Hysterectomy, Oophorectomy ( Bilateral), Post Menopausal Fractures: Treatments: Calcium, Multivitamin ASSESSMENT: The BMD measured at Femur Neck Left is 0.842 g/cm2 with a T-score of -1.4. This patient is considered osteopenic according to [facility]) criteria. L-3 was excluded due to degenerative changes. Site Region Measured Date Measured Age WHO YA BMD Classification T-score AP Spine L1-L4 (L3) 01/25/[age] Normal 2.7 1.499 g/cm2 DualFemur Neck Left 01/25/2018 [age] years Osteopenia -1.4 0.842 g/cm2 World Health Organization [facility]) criteria for post-menopausal, Caucasian Women: Normal       T-score at or above -1 SD Osteopenia   T-score between -1 and -2.5 SD Osteoporosis T-score at or below -2.5 SD RECOMMENDATION: 1. All patients should optimize calcium and vitamin D intake. 2. Consider FDA-approved medical therapies in postmenopausal women and men aged 15 years and older, based on the following:  a. A hip or vertebral(clinical or morphometric) fracture. b. T-Score < -2.5 at the femoral neck or spine after appropriate evaluation to exclude secondary causes c. Low bone mass (T-score between -1.0 and -2.5 at the femoral neck or spine) and a 10 year probability of a hip fracture >3% or a 10 year probability of major osteoporosis-related fracture > 20% based on the [place]-adapted WHO algorithm d. Clinical judgement and/or patient preferences may indicate treatment for people with 10-year fracture probabilities above or below these levels FOLLOW-UP: Patients with diagnosis of osteoporosis or at high risk for fracture should have regular bone mineral density tests. For patients eligible for Medicare, routine testing is allowed once every 2 years. The testing frequency can be increased to one year for patients  who have rapidly progressing disease, those who are receiving or discontinuing medical therapy to restore bone mass, or have additional risk factors. I have reviewed this report and agree with the above findings. [facility] Patient: Michelle [name] Referring Physician: Rosalita Chessman [name] Birth Date: Jul 16, 1946 [age] Patient ID: [id number] Height: 62.7 in. Weight: 120.8 lbs. Measured: 01/25/2018 11:03:46 AM (16 SP 2) Gender: Female Ethnicity: White Analyzed: 01/25/2018 11:04:16 AM (16 SP 2) FRAX* 10-year Probability of Fracture Based on femoral neck BMD: DualFemur (Left) Major Osteoporotic Fracture: 9.3% Hip Fracture:                1.5% Population:                  [place] (Caucasian) Risk Factors:                None *FRAX is a Materials engineer of the [employer] for Metabolic Bone Disease, a World Pharmacologist (WHO) [facility]. ASSESSMENT: The probability of a major osteoporotic fracture is 9.3% within the next ten years. The probability of a hip fracture is 1.5% within the next ten years. Electronically Signed   By: Lowella Grip III M.D.    On: 01/25/2018 11:40   Mm 3d Screen Breast Bilateral  Result Date: 01/25/2018 CLINICAL DATA:  Screening. EXAM: DIGITAL SCREENING BILATERAL MAMMOGRAM WITH TOMO AND CAD COMPARISON:  Previous exam(s). ACR Breast Density Category c: The breast tissue is heterogeneously dense, which may obscure small masses. FINDINGS: There are no findings suspicious for malignancy. Images were processed with CAD. IMPRESSION: No mammographic evidence of malignancy. A result letter of this screening mammogram will be mailed directly to the patient. RECOMMENDATION: Screening mammogram in one year. (Code:SM-B-01Y) BI-RADS CATEGORY  1: Negative. Electronically Signed   By: Everlean Alstrom M.D.   On: 01/25/2018 16:09     Assessment & Plan:  Plan  I am having Michelle [name] start on cephALEXin and HYDROcodone-acetaminophen. I am also having her maintain her aspirin, multivitamin, ALPHAGAN P, AMBULATORY NON FORMULARY MEDICATION, fluticasone, loratadine, sertraline, donepezil, gabapentin, memantine, benzonatate, levothyroxine, pravastatin, and valsartan.  Meds ordered this encounter  Medications  . cephALEXin (KEFLEX) 500 MG capsule    Sig: Take 1 capsule (500 mg total) by mouth 2 (two) times daily.    Dispense:  14 capsule    Refill:  0  . HYDROcodone-acetaminophen (NORCO/VICODIN) 5-325 MG tablet    Sig: Take 1 tablet by mouth every 6 (six) hours as needed for moderate pain.    Dispense:  30 tablet    Refill:  0    Problem List Items Addressed This Visit    None    Visit Diagnoses    Suspicious nevus    -  Primary   Relevant Orders   Ambulatory referral to Dermatology   Ingrown toenail of right foot       Relevant Medications   cephALEXin (KEFLEX) 500 MG capsule   HYDROcodone-acetaminophen (NORCO/VICODIN) 5-325 MG tablet    ok to remove dressing in 24 h -- keep covered with bandaid Pain meds and abx sent to pharmacy   Follow-up: No follow-ups on file.  Ann Held, DO

## 2018-08-13 ENCOUNTER — Other Ambulatory Visit: Payer: Self-pay | Admitting: Family Medicine

## 2018-08-13 DIAGNOSIS — I10 Essential (primary) hypertension: Secondary | ICD-10-CM

## 2018-09-13 IMAGING — DX DG CHEST 2V
2 series · 2 of 2 positions shown · non-contrast
Comparison: Chest x-ray of 01/06/2013

CLINICAL DATA: Dry cough, hypertension

EXAM:
CHEST  2 VIEW

[chest pa]
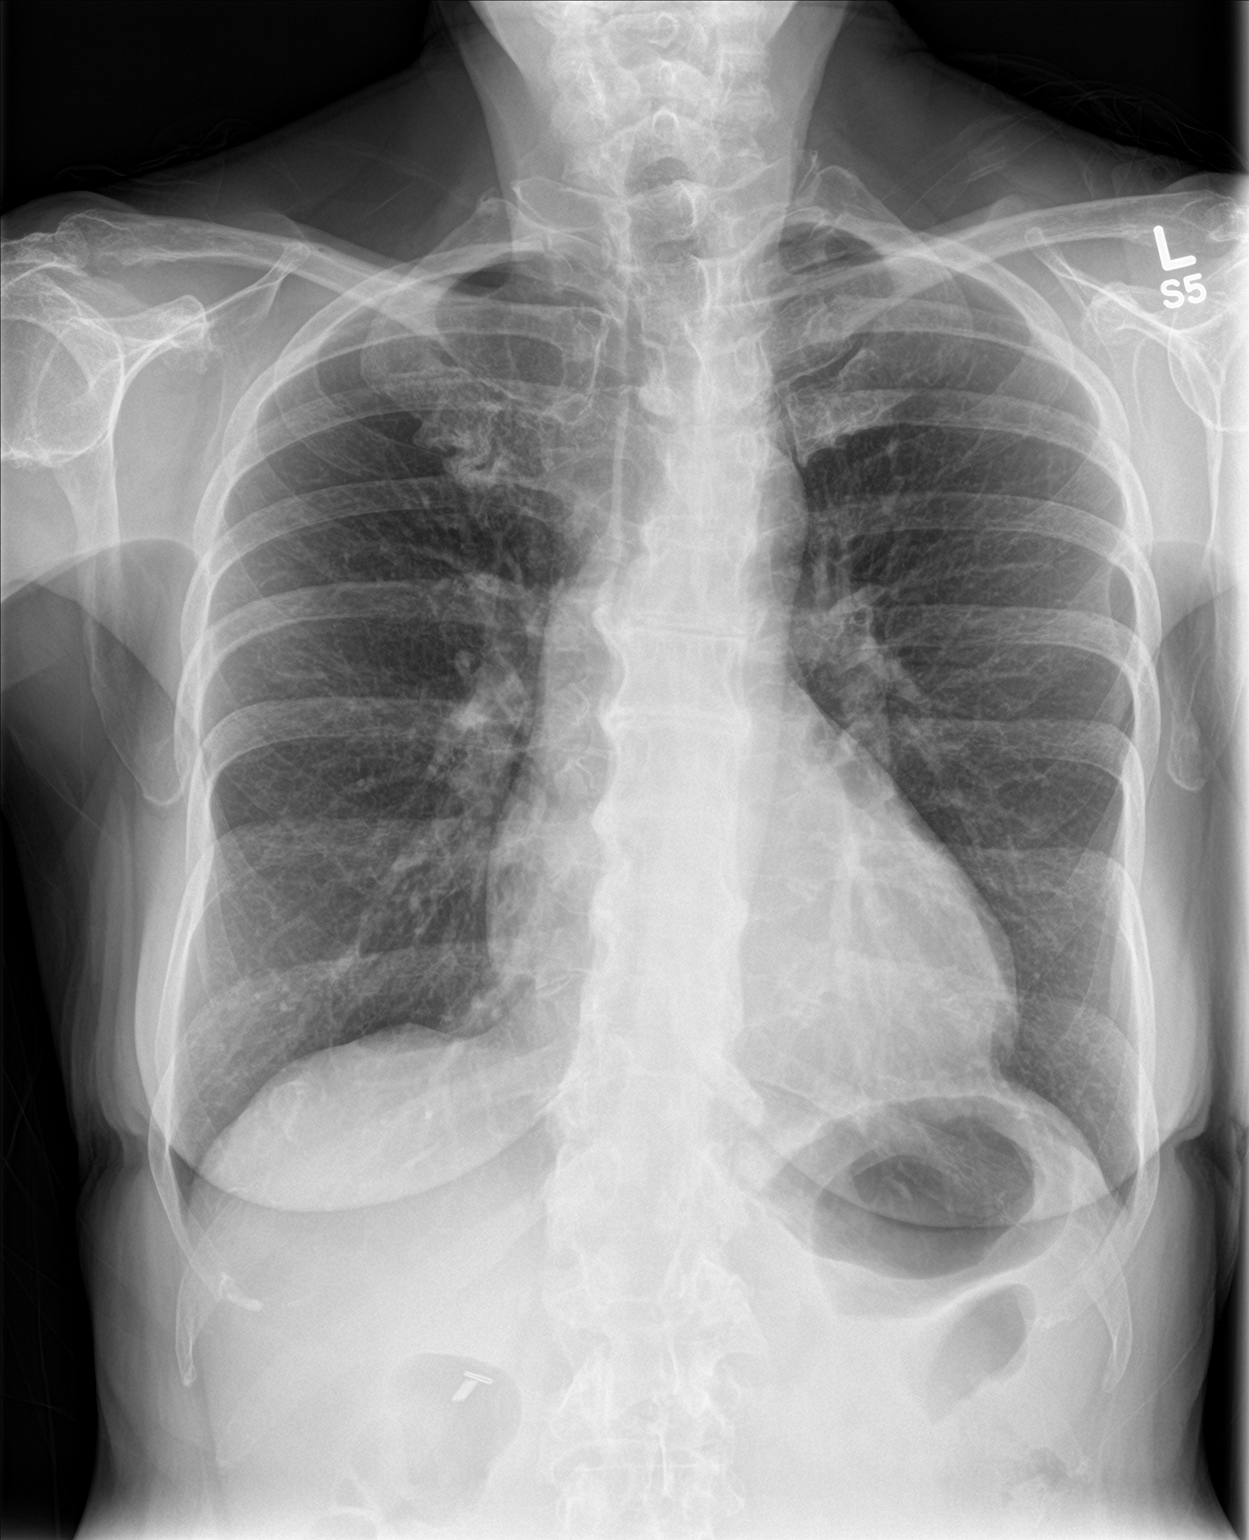

[chest lat]
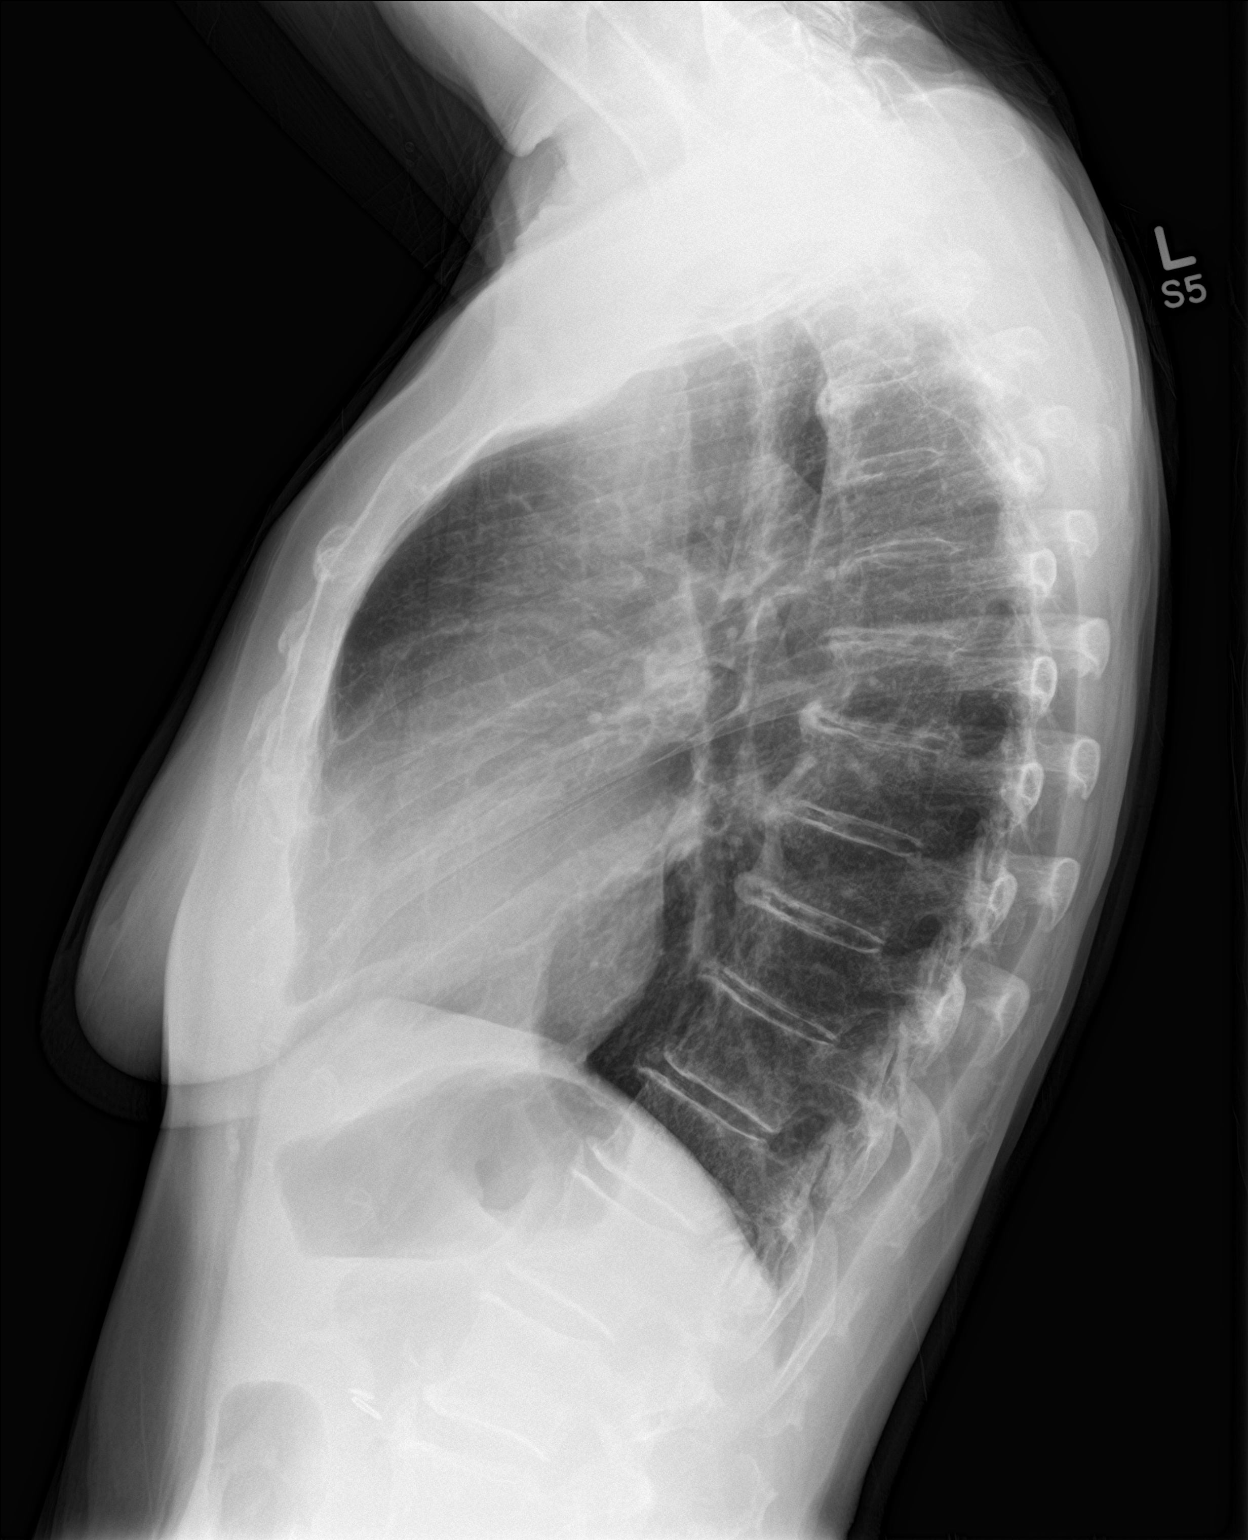

[2 of 2 positions shown; findings below may reference images not displayed]

FINDINGS: No active infiltrate or effusion is seen. Mediastinal and hilar
contours are unremarkable. The heart is within upper limits of
normal. No acute bony abnormality seen with some degenerative change
noted throughout the thoracic spine.
IMPRESSION: No active cardiopulmonary disease.

## 2018-09-18 ENCOUNTER — Other Ambulatory Visit: Payer: Self-pay | Admitting: Family Medicine

## 2018-09-18 DIAGNOSIS — I10 Essential (primary) hypertension: Secondary | ICD-10-CM

## 2018-09-20 ENCOUNTER — Ambulatory Visit: Payer: Medicare HMO | Admitting: Family Medicine

## 2018-09-21 ENCOUNTER — Ambulatory Visit (INDEPENDENT_AMBULATORY_CARE_PROVIDER_SITE_OTHER): Payer: Medicare HMO | Admitting: Family Medicine

## 2018-09-21 ENCOUNTER — Encounter: Payer: Self-pay | Admitting: Family Medicine

## 2018-09-21 DIAGNOSIS — I10 Essential (primary) hypertension: Secondary | ICD-10-CM

## 2018-09-21 DIAGNOSIS — E039 Hypothyroidism, unspecified: Secondary | ICD-10-CM

## 2018-09-21 DIAGNOSIS — F321 Major depressive disorder, single episode, moderate: Secondary | ICD-10-CM | POA: Diagnosis not present

## 2018-09-21 DIAGNOSIS — R69 Illness, unspecified: Secondary | ICD-10-CM | POA: Diagnosis not present

## 2018-09-21 MED ORDER — SERTRALINE HCL 50 MG PO TABS: 50.0000 mg | ORAL_TABLET | Freq: Every day | ORAL | 0 refills | Status: DC

## 2018-09-21 MED ORDER — LEVOTHYROXINE SODIUM 50 MCG PO TABS: 50.0000 ug | ORAL_TABLET | Freq: Every day | ORAL | 3 refills | Status: DC

## 2018-09-21 NOTE — Assessment & Plan Note (Signed)
Well controlled, no changes to meds. Encouraged heart healthy diet such as the DASH diet and exercise as tolerated.  °

## 2018-09-21 NOTE — Progress Notes (Signed)
Patient ID: Michelle [name], female    DOB: 26-Apr-1946  Age: 73 y.o. MRN: [id number]    Subjective:  Subjective  HPI Michelle [name] presents for bp f/u no complaints.   Her husband is with her.   Review of Systems  Constitutional: Negative for activity change, appetite change, chills, diaphoresis, fatigue, fever and unexpected weight change.  HENT: Negative for congestion and hearing loss.   Eyes: Negative for pain, discharge, redness and visual disturbance.  Respiratory: Negative for cough, chest tightness, shortness of breath and wheezing.   Cardiovascular: Negative for chest pain, palpitations and leg swelling.  Gastrointestinal: Negative for abdominal distention, abdominal pain, blood in stool, constipation, diarrhea, nausea and vomiting.  Endocrine: Negative for cold intolerance, heat intolerance, polydipsia, polyphagia and polyuria.  Genitourinary: Negative for difficulty urinating, dyspareunia, dysuria, flank pain, frequency, genital sores, hematuria, menstrual problem, pelvic pain, urgency, vaginal discharge and vaginal pain.  Musculoskeletal: Negative for back pain and myalgias.  Skin: Negative for rash.  Allergic/Immunologic: Negative for environmental allergies.  Neurological: Negative for dizziness, weakness, light-headedness, numbness and headaches.  Hematological: Does not bruise/bleed easily.  Psychiatric/Behavioral: Negative for suicidal ideas. The patient is not nervous/anxious.     History Past Medical History:  Diagnosis Date  . Adenomatous colon polyp   . Allergy   . GERD (gastroesophageal reflux disease)   . Glaucoma   . Hx of hearing loss    bilateral hearing aids  . Hyperlipidemia   . Hypertension   . IBS (irritable bowel syndrome)   . Low back pain   . Thyroid disease     She has a past surgical history that includes Tubal ligation (1977); Cholecystectomy (1998); Abdominal hysterectomy (1998); Gum surgery (1996 / 2013); and Cataract extraction,  bilateral (Bilateral, 04/2015).   Her family history includes Breast cancer in her maternal grandmother; Cancer in her father; Cancer (age of onset: 88) in her brother; Emphysema in her mother; Glaucoma in her mother; Hypertension in her father and mother; Macular degeneration in her father; Other in her brother; Prostate cancer in her brother and father.She reports that she has never smoked. She has never used smokeless tobacco. She reports that she does not drink alcohol or use drugs.  Current Outpatient Medications on File Prior to Visit  Medication Sig Dispense Refill  . ALPHAGAN P 0.1 % SOLN     . AMBULATORY NON FORMULARY MEDICATION IBgard: Takes prn    . aspirin 81 MG tablet Take 81 mg by mouth daily.     . benzonatate (TESSALON) 200 MG capsule Take 1 capsule (200 mg total) by mouth 3 (three) times daily as needed for cough. 90 capsule 1  . donepezil (ARICEPT) 10 MG tablet Take 1 tablet daily 90 tablet 3  . fluticasone (FLONASE) 50 MCG/ACT nasal spray Place 2 sprays into both nostrils daily. 16 g 5  . gabapentin (NEURONTIN) 300 MG capsule Take 1 capsule daily 90 capsule 3  . HYDROcodone-acetaminophen (NORCO/VICODIN) 5-325 MG tablet Take 1 tablet by mouth every 6 (six) hours as needed for moderate pain. 30 tablet 0  . loratadine (CLARITIN) 10 MG tablet Take 1 tablet (10 mg total) by mouth daily. 90 tablet 1  . memantine (NAMENDA) 10 MG tablet Take 1 tablet (10 mg total) by mouth 2 (two) times daily. 180 tablet 3  . Multiple Vitamin (MULTIVITAMIN) tablet Take 1 tablet by mouth daily.    . pravastatin (PRAVACHOL) 20 MG tablet Take 1 tablet (20 mg total) by mouth daily. 90 tablet 3  .  valsartan (DIOVAN) 80 MG tablet TAKE 1 TABLET BY MOUTH EVERY DAY 90 tablet 1   No current facility-administered medications on file prior to visit.      Objective:  Objective  Physical Exam Vitals signs and nursing note reviewed.  Constitutional:      Appearance: She is well-developed.  HENT:     Head:  Normocephalic and atraumatic.  Eyes:     Conjunctiva/sclera: Conjunctivae normal.  Neck:     Musculoskeletal: Normal range of motion and neck supple.     Thyroid: No thyromegaly.     Vascular: No carotid bruit or JVD.  Cardiovascular:     Rate and Rhythm: Normal rate and regular rhythm.     Heart sounds: Normal heart sounds. No murmur.  Pulmonary:     Effort: Pulmonary effort is normal. No respiratory distress.     Breath sounds: Normal breath sounds. No wheezing or rales.  Chest:     Chest wall: No tenderness.  Neurological:     Mental Status: She is alert and oriented to person, place, and time.    BP 126/60 (BP Location: Left Arm, Cuff Size: Normal)   Pulse 66   Temp 98.7 F (37.1 C) (Oral)   Resp 16   Ht 5\' 3"  (1.6 m)   Wt 140 lb 12.8 oz (63.9 kg)   SpO2 98%   BMI 24.94 kg/m  Wt Readings from Last 3 Encounters:  09/21/18 140 lb 12.8 oz (63.9 kg)  08/03/18 128 lb (58.1 kg)  05/21/18 128 lb 6.4 oz (58.2 kg)     Lab Results  Component Value Date   WBC 6.3 05/12/2018   HGB 11.5 (L) 05/12/2018   HCT 34.4 (L) 05/12/2018   PLT 208.0 05/12/2018   GLUCOSE 95 07/21/2018   CHOL 155 07/21/2018   TRIG 63.0 07/21/2018   HDL 68.30 07/21/2018   LDLCALC 74 07/21/2018   ALT 23 07/21/2018   AST 24 07/21/2018   NA 142 07/21/2018   K 4.6 07/21/2018   CL 104 07/21/2018   CREATININE 0.74 07/21/2018   [name] 20 07/21/2018   CO2 34 (H) 07/21/2018   TSH 3.44 05/12/2018   MICROALBUR 1.0 11/03/2014    Dg Bone Density  Result Date: 01/25/2018 EXAM: DUAL X-RAY ABSORPTIOMETRY (DXA) FOR BONE MINERAL DENSITY IMPRESSION: Alferd Apa Silvana CHASE Your patient Severa Jeremiah completed a BMD test on 01/25/2018 using the [place] (analysis version: 16.SP2) manufactured by [employer]. The following summarizes the results of our evaluation. PATIENT: Name: Kawanna, Christley Patient ID: [id number] Birth Date: 08-19-1946 Height: 62.7 in. Gender: Female Measured: 01/25/2018 Weight: 120.8 lbs.  Indications: Advanced Age, Caucasian, Estrogen Deficiency, Hysterectomy, Oophorectomy ( Bilateral), Post Menopausal Fractures: Treatments: Calcium, Multivitamin ASSESSMENT: The BMD measured at Femur Neck Left is 0.842 g/cm2 with a T-score of -1.4. This patient is considered osteopenic according to [facility]) criteria. L-3 was excluded due to degenerative changes. Site Region Measured Date Measured Age WHO YA BMD Classification T-score AP Spine L1-L4 (L3) 01/25/2018 71.9 Normal 2.7 1.499 g/cm2 DualFemur Neck Left 01/25/2018 71.9 years Osteopenia -1.4 0.842 g/cm2 World Health Organization [facility]) criteria for post-menopausal, Caucasian Women: Normal       T-score at or above -1 SD Osteopenia   T-score between -1 and -2.5 SD Osteoporosis T-score at or below -2.5 SD RECOMMENDATION: 1. All patients should optimize calcium and vitamin D intake. 2. Consider FDA-approved medical therapies in postmenopausal women and men aged 75 years and older, based on  the following: a. A hip or vertebral(clinical or morphometric) fracture. b. T-Score < -2.5 at the femoral neck or spine after appropriate evaluation to exclude secondary causes c. Low bone mass (T-score between -1.0 and -2.5 at the femoral neck or spine) and a 10 year probability of a hip fracture >3% or a 10 year probability of major osteoporosis-related fracture > 20% based on the [place]-adapted WHO algorithm d. Clinical judgement and/or patient preferences may indicate treatment for people with 10-year fracture probabilities above or below these levels FOLLOW-UP: Patients with diagnosis of osteoporosis or at high risk for fracture should have regular bone mineral density tests. For patients eligible for Medicare, routine testing is allowed once every 2 years. The testing frequency can be increased to one year for patients who have rapidly progressing disease, those who are receiving or discontinuing medical therapy to restore bone mass, or have additional  risk factors. I have reviewed this report and agree with the above findings. [facility] Patient: Michelle [name] Referring Physician: Rosalita Chessman CHASE Birth Date: 1946/04/01 [age] Patient ID: [id number] Height: 62.7 in. Weight: 120.8 lbs. Measured: 01/25/2018 11:03:46 AM (16 SP 2) Gender: Female Ethnicity: White Analyzed: 01/25/2018 11:04:16 AM (16 SP 2) FRAX* 10-year Probability of Fracture Based on femoral neck BMD: DualFemur (Left) Major Osteoporotic Fracture: 9.3% Hip Fracture:                1.5% Population:                  [place] (Caucasian) Risk Factors:                None *FRAX is a Materials engineer of the [employer] for Metabolic Bone Disease, a World Pharmacologist (WHO) [facility]. ASSESSMENT: The probability of a major osteoporotic fracture is 9.3% within the next ten years. The probability of a hip fracture is 1.5% within the next ten years. Electronically Signed   By: Lowella Grip III M.D.   On: 01/25/2018 11:40   Mm 3d Screen Breast Bilateral  Result Date: 01/25/2018 CLINICAL DATA:  Screening. EXAM: DIGITAL SCREENING BILATERAL MAMMOGRAM WITH TOMO AND CAD COMPARISON:  Previous exam(s). ACR Breast Density Category c: The breast tissue is heterogeneously dense, which may obscure small masses. FINDINGS: There are no findings suspicious for malignancy. Images were processed with CAD. IMPRESSION: No mammographic evidence of malignancy. A result letter of this screening mammogram will be mailed directly to the patient. RECOMMENDATION: Screening mammogram in one year. (Code:SM-B-01Y) BI-RADS CATEGORY  1: Negative. Electronically Signed   By: Everlean Alstrom M.D.   On: 01/25/2018 16:09     Assessment & Plan:  Plan  I have discontinued Sundee W. Lux's cephALEXin. I have also changed her sertraline. Additionally, I am having her maintain her aspirin, multivitamin, ALPHAGAN P, AMBULATORY NON FORMULARY MEDICATION, fluticasone,  loratadine, donepezil, gabapentin, memantine, benzonatate, pravastatin, HYDROcodone-acetaminophen, valsartan, and levothyroxine.  Meds ordered this encounter  Medications  . sertraline (ZOLOFT) 50 MG tablet    Sig: Take 1 tablet (50 mg total) by mouth daily.    Dispense:  90 tablet    Refill:  0  . levothyroxine (SYNTHROID, LEVOTHROID) 50 MCG tablet    Sig: Take 1 tablet (50 mcg total) by mouth daily.    Dispense:  90 tablet    Refill:  3    Problem List Items Addressed This Visit      Unprioritized   Essential  hypertension    Well controlled, no changes to meds. Encouraged heart healthy diet such as the DASH diet and exercise as tolerated.        Hypothyroidism   Relevant Medications   levothyroxine (SYNTHROID, LEVOTHROID) 50 MCG tablet    Other Visit Diagnoses    Depression, major, single episode, moderate (HCC)       Relevant Medications   sertraline (ZOLOFT) 50 MG tablet      Follow-up: Return in about 3 months (around 12/21/2018), or if symptoms worsen or fail to improve, for annual exam, fasting.  Ann Held, DO

## 2018-09-21 NOTE — Patient Instructions (Signed)
DASH Eating Plan  DASH stands for "Dietary Approaches to Stop Hypertension." The DASH eating plan is a healthy eating plan that has been shown to reduce high blood pressure (hypertension). It may also reduce your risk for type 2 diabetes, heart disease, and stroke. The DASH eating plan may also help with weight loss.  What are tips for following this plan?    General guidelines   Avoid eating more than 2,300 mg (milligrams) of salt (sodium) a day. If you have hypertension, you may need to reduce your sodium intake to 1,500 mg a day.   Limit alcohol intake to no more than 1 drink a day for nonpregnant women and 2 drinks a day for men. One drink equals 12 oz of beer, 5 oz of wine, or 1 oz of hard liquor.   Work with your health care provider to maintain a healthy body weight or to lose weight. Ask what an ideal weight is for you.   Get at least 30 minutes of exercise that causes your heart to beat faster (aerobic exercise) most days of the week. Activities may include walking, swimming, or biking.   Work with your health care provider or diet and nutrition specialist (dietitian) to adjust your eating plan to your individual calorie needs.  Reading food labels     Check food labels for the amount of sodium per serving. Choose foods with less than 5 percent of the Daily Value of sodium. Generally, foods with less than 300 mg of sodium per serving fit into this eating plan.   To find whole grains, look for the word "whole" as the first word in the ingredient list.  Shopping   Buy products labeled as "low-sodium" or "no salt added."   Buy fresh foods. Avoid canned foods and premade or frozen meals.  Cooking   Avoid adding salt when cooking. Use salt-free seasonings or herbs instead of table salt or sea salt. Check with your health care provider or pharmacist before using salt substitutes.   Do not fry foods. Cook foods using healthy methods such as baking, boiling, grilling, and broiling instead.   Cook with  heart-healthy oils, such as olive, canola, soybean, or sunflower oil.  Meal planning   Eat a balanced diet that includes:  ? 5 or more servings of fruits and vegetables each day. At each meal, try to fill half of your plate with fruits and vegetables.  ? Up to 6-8 servings of whole grains each day.  ? Less than 6 oz of lean meat, poultry, or fish each day. A 3-oz serving of meat is about the same size as a deck of cards. One egg equals 1 oz.  ? 2 servings of low-fat dairy each day.  ? A serving of nuts, seeds, or beans 5 times each week.  ? Heart-healthy fats. Healthy fats called Omega-3 fatty acids are found in foods such as flaxseeds and coldwater fish, like sardines, salmon, and mackerel.   Limit how much you eat of the following:  ? Canned or prepackaged foods.  ? Food that is high in trans fat, such as fried foods.  ? Food that is high in saturated fat, such as fatty meat.  ? Sweets, desserts, sugary drinks, and other foods with added sugar.  ? Full-fat dairy products.   Do not salt foods before eating.   Try to eat at least 2 vegetarian meals each week.   Eat more home-cooked food and less restaurant, buffet, and fast food.     When eating at a restaurant, ask that your food be prepared with less salt or no salt, if possible.  What foods are recommended?  The items listed may not be a complete list. Talk with your dietitian about what dietary choices are best for you.  Grains  Whole-grain or whole-wheat bread. Whole-grain or whole-wheat pasta. Brown rice. Oatmeal. Quinoa. Bulgur. Whole-grain and low-sodium cereals. Pita bread. Low-fat, low-sodium crackers. Whole-wheat flour tortillas.  Vegetables  Fresh or frozen vegetables (raw, steamed, roasted, or grilled). Low-sodium or reduced-sodium tomato and vegetable juice. Low-sodium or reduced-sodium tomato sauce and tomato paste. Low-sodium or reduced-sodium canned vegetables.  Fruits  All fresh, dried, or frozen fruit. Canned fruit in natural juice (without  added sugar).  Meat and other protein foods  Skinless chicken or turkey. Ground chicken or turkey. Pork with fat trimmed off. Fish and seafood. Egg whites. Dried beans, peas, or lentils. Unsalted nuts, nut butters, and seeds. Unsalted canned beans. Lean cuts of beef with fat trimmed off. Low-sodium, lean deli meat.  Dairy  Low-fat (1%) or fat-free (skim) milk. Fat-free, low-fat, or reduced-fat cheeses. Nonfat, low-sodium ricotta or cottage cheese. Low-fat or nonfat yogurt. Low-fat, low-sodium cheese.  Fats and oils  Soft margarine without trans fats. Vegetable oil. Low-fat, reduced-fat, or light mayonnaise and salad dressings (reduced-sodium). Canola, safflower, olive, soybean, and sunflower oils. Avocado.  Seasoning and other foods  Herbs. Spices. Seasoning mixes without salt. Unsalted popcorn and pretzels. Fat-free sweets.  What foods are not recommended?  The items listed may not be a complete list. Talk with your dietitian about what dietary choices are best for you.  Grains  Baked goods made with fat, such as croissants, muffins, or some breads. Dry pasta or rice meal packs.  Vegetables  Creamed or fried vegetables. Vegetables in a cheese sauce. Regular canned vegetables (not low-sodium or reduced-sodium). Regular canned tomato sauce and paste (not low-sodium or reduced-sodium). Regular tomato and vegetable juice (not low-sodium or reduced-sodium). Pickles. Olives.  Fruits  Canned fruit in a light or heavy syrup. Fried fruit. Fruit in cream or butter sauce.  Meat and other protein foods  Fatty cuts of meat. Ribs. Fried meat. Bacon. Sausage. Bologna and other processed lunch meats. Salami. Fatback. Hotdogs. Bratwurst. Salted nuts and seeds. Canned beans with added salt. Canned or smoked fish. Whole eggs or egg yolks. Chicken or turkey with skin.  Dairy  Whole or 2% milk, cream, and half-and-half. Whole or full-fat cream cheese. Whole-fat or sweetened yogurt. Full-fat cheese. Nondairy creamers. Whipped toppings.  Processed cheese and cheese spreads.  Fats and oils  Butter. Stick margarine. Lard. Shortening. Ghee. Bacon fat. Tropical oils, such as coconut, palm kernel, or palm oil.  Seasoning and other foods  Salted popcorn and pretzels. Onion salt, garlic salt, seasoned salt, table salt, and sea salt. Worcestershire sauce. Tartar sauce. Barbecue sauce. Teriyaki sauce. Soy sauce, including reduced-sodium. Steak sauce. Canned and packaged gravies. Fish sauce. Oyster sauce. Cocktail sauce. Horseradish that you find on the shelf. Ketchup. Mustard. Meat flavorings and tenderizers. Bouillon cubes. Hot sauce and Tabasco sauce. Premade or packaged marinades. Premade or packaged taco seasonings. Relishes. Regular salad dressings.  Where to find more information:   National Heart, Lung, and Blood Institute: www.nhlbi.nih.gov   American Heart Association: www.heart.org  Summary   The DASH eating plan is a healthy eating plan that has been shown to reduce high blood pressure (hypertension). It may also reduce your risk for type 2 diabetes, heart disease, and stroke.   With the   DASH eating plan, you should limit salt (sodium) intake to 2,300 mg a day. If you have hypertension, you may need to reduce your sodium intake to 1,500 mg a day.   When on the DASH eating plan, aim to eat more fresh fruits and vegetables, whole grains, lean proteins, low-fat dairy, and heart-healthy fats.   Work with your health care provider or diet and nutrition specialist (dietitian) to adjust your eating plan to your individual calorie needs.  This information is not intended to replace advice given to you by your health care provider. Make sure you discuss any questions you have with your health care provider.  Document Released: 08/14/2011 Document Revised: 08/18/2016 Document Reviewed: 08/18/2016  Elsevier Interactive Patient Education  2019 [employer]

## 2018-09-28 ENCOUNTER — Other Ambulatory Visit: Payer: Self-pay | Admitting: Family Medicine

## 2018-09-28 DIAGNOSIS — F321 Major depressive disorder, single episode, moderate: Secondary | ICD-10-CM

## 2018-10-21 ENCOUNTER — Ambulatory Visit: Payer: Medicare HMO | Admitting: Neurology

## 2018-10-21 ENCOUNTER — Encounter: Payer: Self-pay | Admitting: Neurology

## 2018-10-21 ENCOUNTER — Other Ambulatory Visit: Payer: Self-pay

## 2018-10-21 VITALS — BP 118/66 | HR 74 | Ht 63.0 in | Wt 142.0 lb

## 2018-10-21 DIAGNOSIS — F0280 Dementia in other diseases classified elsewhere without behavioral disturbance: Secondary | ICD-10-CM

## 2018-10-21 DIAGNOSIS — R69 Illness, unspecified: Secondary | ICD-10-CM | POA: Diagnosis not present

## 2018-10-21 DIAGNOSIS — G301 Alzheimer's disease with late onset: Secondary | ICD-10-CM | POA: Diagnosis not present

## 2018-10-21 DIAGNOSIS — G44209 Tension-type headache, unspecified, not intractable: Secondary | ICD-10-CM | POA: Diagnosis not present

## 2018-10-21 MED ORDER — GABAPENTIN 300 MG PO CAPS: ORAL_CAPSULE | ORAL | 3 refills | Status: DC

## 2018-10-21 MED ORDER — DONEPEZIL HCL 10 MG PO TABS: ORAL_TABLET | ORAL | 3 refills | Status: DC

## 2018-10-21 MED ORDER — MEMANTINE HCL 10 MG PO TABS: 10.0000 mg | ORAL_TABLET | Freq: Two times a day (BID) | ORAL | 3 refills | Status: DC

## 2018-10-21 NOTE — Patient Instructions (Signed)
1. Increase gabapentin 100mg : Take 2 capsules every night  2. Continue Donepezil 10mg  daily and Memantine 10mg  twice a day   3. Continue to monitor driving. Start having family administer medications  4. Follow-up in 6 months, call for any changes  FALL PRECAUTIONS: Be cautious when walking. Scan the area for obstacles that may increase the risk of trips and falls. When getting up in the mornings, sit up at the edge of the bed for a few minutes before getting out of bed. Consider elevating the bed at the head end to avoid drop of blood pressure when getting up. Walk always in a well-lit room (use night lights in the walls). Avoid area rugs or power cords from appliances in the middle of the walkways. Use a walker or a cane if necessary and consider physical therapy for balance exercise. Get your eyesight checked regularly.  HOME SAFETY: Consider the safety of the kitchen when operating appliances like stoves, microwave oven, and blender. Consider having supervision and share cooking responsibilities until no longer able to participate in those. Accidents with firearms and other hazards in the house should be identified and addressed as well.  DRIVING: Regarding driving, in patients with progressive memory problems, driving will be impaired. We advise to have someone else do the driving if trouble finding directions or if minor accidents are reported. Independent driving assessment is available to determine safety of driving.  ABILITY TO BE LEFT ALONE: If patient is unable to contact 911 operator, consider using LifeLine, or when the need is there, arrange for someone to stay with patients. Smoking is a fire hazard, consider supervision or cessation. Risk of wandering should be assessed by caregiver and if detected at any point, supervision and safe proof recommendations should be instituted.  MEDICATION SUPERVISION: Inability to self-administer medication needs to be constantly addressed. Implement a  mechanism to ensure safe administration of the medications.  RECOMMENDATIONS FOR ALL PATIENTS WITH MEMORY PROBLEMS: 1. Continue to exercise (Recommend 30 minutes of walking everyday, or 3 hours every week) 2. Increase social interactions - continue going to [place] and enjoy social gatherings with friends and family 3. Eat healthy, avoid fried foods and eat more fruits and vegetables 4. Maintain adequate blood pressure, blood sugar, and blood cholesterol level. Reducing the risk of stroke and cardiovascular disease also helps promoting better memory. 5. Avoid stressful situations. Live a simple life and avoid aggravations. Organize your time and prepare for the next day in anticipation. 6. Sleep well, avoid any interruptions of sleep and avoid any distractions in the bedroom that may interfere with adequate sleep quality 7. Avoid sugar, avoid sweets as there is a strong link between excessive sugar intake, diabetes, and cognitive impairment The Mediterranean diet has been shown to help patients reduce the risk of progressive memory disorders and reduces cardiovascular risk. This includes eating fish, eat fruits and green leafy vegetables, nuts like almonds and hazelnuts, walnuts, and also use olive oil. Avoid fast foods and fried foods as much as possible. Avoid sweets and sugar as sugar use has been linked to worsening of memory function.  There is always a concern of gradual progression of memory problems. If this is the case, then we may need to adjust level of care according to patient needs. Support, both to the patient and caregiver, should then be put into place.

## 2018-10-21 NOTE — Progress Notes (Signed)
NEUROLOGY FOLLOW UP OFFICE NOTE  Michelle [name] [id number]  DOB: 1945-09-24  HISTORY OF PRESENT ILLNESS: I had the pleasure of seeing Michelle [name] in follow-up in the neurology clinic on 10/21/2018. She is again accompanied by her husband who helps supplement the history today. The patient was last seen 7 months ago for mild dementia. MMSE 24/30 in July 2019. She is taking Donepezil 10mg  daily and Memantine 10mg  BID without side effects. Her husband reports continued memory changes, they were at the [facility] and she could not remember her phone number and address and kept looking at her husband. She states she has good and bad days. She continues to drive short distances and her husband is always with her when driving. He denies any concerns. Her daughter Sharee Pimple fixes her pillbox, she misses morning medications and her husband has to remind her. She would get ill at him when he makes her take the pills. He has noticed some sundowning, she gets more repetitive in the evenings and with the dark rainy weather. No hallucinations or paranoia. She is having a little more headaches, gabapentin 300mg  qhs previously helped some. No side effects. She denies any dizziness, vision changes, focal numbness/tingling/weakness, no falls.   HPI 04/02/16: This is a pleasant 73 yo RH woman with a history of hypertension, hyperlipidemia, hypothyroidism, who presented for evaluation of memory loss. Her daughter started noticing memory changes a year ago. The patient states she can tell a difference in her memory, but that her family notices it more and "they may think I'm in denial." Her family has noticed worsening over the past 6 months, she repeats herself several times, sometimes having the same conversation four times within an hour. Her husband reports that she asked him repeatedly last 15th of July weekend what he was wearing to go home. She misplaces things frequently in the house. She left the stove on one time boiling  water. She takes longer to get ready to go out. She denies getting lost driving, no missed bills or medications. 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 Her family also expressed concern about personality changes, she used to be laidback but now her patience is not there. She is cussing a lot more easily now. She doesn't want to do things she used to like. She does not like going out, and stayed in the room while at the beach more than with her family. No paranoia. No difficulties with ADLs. There is no family history of dementia, she denies any history of head injuries, alcohol intake. She has been having dull right frontal headaches for the past 6 months attributed to her sinuses, no associated nausea/vomiting. She denies any dizziness, diplopia, dysarthria/dysphagia, focal numbness/tingling/weakness, bowel/bladder dysfunction. She has occasional neck and left shoulder pain. No falls.   She had an MMSE done with her PCP this month, MMSE 25/30. TSH and B12 unremarkable. She has been unable to do the MRI brain.  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 Post-contrast MRI was ordered, which again showed the 28mm enhancing dural-based mass posterior to the clivus on the right. There is mass effect on the anterior pons on the right which is mildly compressed. The mass is touching the basilar causing mild displacement to the left. There is no  definite invasion of the sella. It may be touching the cisternal segment of the trigeminal nerve.   PAST MEDICAL HISTORY: Past Medical History:  Diagnosis Date  . Adenomatous colon polyp   . Allergy   . GERD (gastroesophageal reflux disease)   . Glaucoma   . Hx of hearing loss    bilateral hearing aids  . Hyperlipidemia   . Hypertension   . IBS (irritable bowel  syndrome)   . Low back pain   . Thyroid disease     MEDICATIONS: Current Outpatient Medications on File Prior to Visit  Medication Sig Dispense Refill  . ALPHAGAN P 0.1 % SOLN     . AMBULATORY NON FORMULARY MEDICATION IBgard: Takes prn    . aspirin 81 MG tablet Take 81 mg by mouth daily.     . benzonatate (TESSALON) 200 MG capsule Take 1 capsule (200 mg total) by mouth 3 (three) times daily as needed for cough. 90 capsule 1  . donepezil (ARICEPT) 10 MG tablet Take 1 tablet daily 90 tablet 3  . fluticasone (FLONASE) 50 MCG/ACT nasal spray Place 2 sprays into both nostrils daily. 16 g 5  . gabapentin (NEURONTIN) 300 MG capsule Take 1 capsule daily 90 capsule 3  . HYDROcodone-acetaminophen (NORCO/VICODIN) 5-325 MG tablet Take 1 tablet by mouth every 6 (six) hours as needed for moderate pain. 30 tablet 0  . levothyroxine (SYNTHROID, LEVOTHROID) 50 MCG tablet Take 1 tablet (50 mcg total) by mouth daily. 90 tablet 3  . loratadine (CLARITIN) 10 MG tablet Take 1 tablet (10 mg total) by mouth daily. 90 tablet 1  . memantine (NAMENDA) 10 MG tablet Take 1 tablet (10 mg total) by mouth 2 (two) times daily. 180 tablet 3  . Multiple Vitamin (MULTIVITAMIN) tablet Take 1 tablet by mouth daily.    . pravastatin (PRAVACHOL) 20 MG tablet Take 1 tablet (20 mg total) by mouth daily. 90 tablet 3  . sertraline (ZOLOFT) 50 MG tablet Take 1 tablet (50 mg total) by mouth daily. 90 tablet 0  . valsartan (DIOVAN) 80 MG tablet TAKE 1 TABLET BY MOUTH EVERY DAY 90 tablet 1   No current facility-administered medications on file prior to visit.     ALLERGIES: Allergies  Allergen Reactions  . No Known Allergies     FAMILY HISTORY: Family History  Problem Relation Age of Onset  . Macular degeneration Father   . Prostate cancer Father   . Hypertension Father   . Cancer Father        prostate  . Glaucoma Mother   . Emphysema Mother   . Hypertension Mother   . Other Brother        Brain tumor  . Cancer  Brother 82       brain tumor  . Breast cancer Maternal Grandmother   . Prostate cancer Brother     SOCIAL HISTORY: Social History   Socioeconomic History  . Marital status: Married    Spouse name: Juanda Crumble, "Butch"  . Number of children: 2  . Years of education: Not on file  . Highest education level: Not on file  Occupational History  . Occupation: retired    Fish farm manager: UNEMPLOYED  Social Needs  . Financial resource strain: Not on file  . Food insecurity:    Worry: Not on file    Inability: Not on file  . Transportation needs:    Medical: Not on file    Non-medical: Not on file  Tobacco Use  . Smoking status:  Never Smoker  . Smokeless tobacco: Never Used  Substance and Sexual Activity  . Alcohol use: No  . Drug use: No  . Sexual activity: Yes    Partners: Male    Birth control/protection: Surgical  Lifestyle  . Physical activity:    Days per week: Not on file    Minutes per session: Not on file  . Stress: Not on file  Relationships  . Social connections:    Talks on phone: Not on file    Gets together: Not on file    Attends religious service: Not on file    Active member of club or organization: Not on file    Attends meetings of clubs or organizations: Not on file    Relationship status: Not on file  . Intimate partner violence:    Fear of current or ex partner: Not on file    Emotionally abused: Not on file    Physically abused: Not on file    Forced sexual activity: Not on file  Other Topics Concern  . Not on file  Social History Narrative   Exercise--no    REVIEW OF SYSTEMS: Constitutional: No fevers, chills, or sweats, no generalized fatigue, change in appetite Eyes: No visual changes, double vision, eye pain Ear, nose and throat: No hearing loss, ear pain, nasal congestion, sore throat Cardiovascular: No chest pain, palpitations Respiratory:  No shortness of breath at rest or with exertion, wheezes GastrointestinaI: No nausea, vomiting, diarrhea,  abdominal pain, fecal incontinence Genitourinary:  No dysuria, urinary retention or frequency Musculoskeletal:  No neck pain, back pain Integumentary: No rash, pruritus, skin lesions Neurological: as above Psychiatric: No depression, insomnia, anxiety Endocrine: No palpitations, fatigue, diaphoresis, mood swings, change in appetite, change in weight, increased thirst Hematologic/Lymphatic:  No anemia, purpura, petechiae. Allergic/Immunologic: no itchy/runny eyes, nasal congestion, recent allergic reactions, rashes  PHYSICAL EXAM: Vitals:   10/21/18 1148  BP: 118/66  Pulse: 74  SpO2: 98%   General: No acute distress Head:  Normocephalic/atraumatic, no temporal tenderness or ropiness Neck: supple, no paraspinal tenderness, full range of motion Heart:  Regular rate and rhythm Lungs:  Clear to auscultation bilaterally Back: No paraspinal tenderness Skin/Extremities: No rash, no edema Neurological Exam: alert and oriented to person, place, and month/seasonyear. No aphasia or dysarthria. Fund of knowledge is appropriate.  Remote memory intact. 0/3 delayed recall. Attention and concentration are reduced. Able to name objects and repeat phrases.  MMSE - Mini Mental State Exam 10/21/2018 03/15/2018 01/18/2018  Orientation to time 3 2 5   Orientation to Place 5 5 5   Registration 3 3 3   Attention/ Calculation 1 5 5   Recall 0 0 0  Language- name 2 objects 2 2 2   Language- repeat 1 1 1   Language- follow 3 step command 3 3 2   Language- read & follow direction 1 1 1   Write a sentence 1 1 1   Copy design 1 1 0  Total score 21 24 25    Cranial nerves: Pupils equal, round, reactive to light.  Extraocular movements intact with no nystagmus. Visual fields full. Facial sensation intact. No facial asymmetry. Tongue, uvula, palate midline.  Motor: Bulk and tone normal, muscle strength: 5/5 throughout with no pronator drift.  Sensation intact to light touch. Deep tendon reflexes +1 throughout, toes  downgoing.  Finger to nose testing intact.  Gait narrow-based and steady, difficulty with tandem walk.  IMPRESSION: This is a pleasant 73 yo RH woman with a history of hypertension, hyperlipidemia, hypothyroidism, with mild dementia,  likely due to Alzheimer's disease. There is continued gradual decline, MMSE today 21/30 (24/30 in July 2019). She is on Donepezil 10mg  daily and Memantine 10mg  BID. Continue to monitor driving, she was advised to have family increase medication supervision. She is reporting more headaches and will increase gabapentin 300mg  to 2 tabs qhs. We again discussed the importance of control of vascular risk factors, physical exercise, and brain stimulation exercises for brain health. Follow-up in 6 months, they know to call for any changes.   Thank you for allowing me to participate in her care.  Please do not hesitate to call for any questions or concerns.  The duration of this appointment visit was 30 minutes of face-to-face time with the patient.  Greater than 50% of this time was spent in counseling, explanation of diagnosis, planning of further management, and coordination of care.   Ellouise Newer, M.D.   CC: Dr. Cheri Rous

## 2018-12-14 ENCOUNTER — Encounter: Payer: Self-pay | Admitting: Family Medicine

## 2018-12-14 DIAGNOSIS — D485 Neoplasm of uncertain behavior of skin: Secondary | ICD-10-CM | POA: Diagnosis not present

## 2018-12-14 DIAGNOSIS — C44519 Basal cell carcinoma of skin of other part of trunk: Secondary | ICD-10-CM | POA: Diagnosis not present

## 2018-12-17 ENCOUNTER — Telehealth: Payer: Self-pay | Admitting: Family Medicine

## 2018-12-17 NOTE — Telephone Encounter (Signed)
Copied from [place] [phone]. Topic: Appointment Scheduling - Scheduling Inquiry for Clinic >> Dec 17, 2018  4:21 PM Blase Mess A wrote: CRM for notification. See Telephone encounter for: 12/17/18. Patient husband is calling to reschedule appt [phone]. Patient was interested in a virtual vist. Please call Back.

## 2018-12-20 NOTE — Telephone Encounter (Signed)
Appt kept and will do virtual visit.

## 2018-12-21 ENCOUNTER — Other Ambulatory Visit: Payer: Self-pay

## 2018-12-21 ENCOUNTER — Ambulatory Visit (INDEPENDENT_AMBULATORY_CARE_PROVIDER_SITE_OTHER): Payer: Medicare HMO | Admitting: Family Medicine

## 2018-12-21 ENCOUNTER — Encounter: Payer: Self-pay | Admitting: Family Medicine

## 2018-12-21 DIAGNOSIS — E785 Hyperlipidemia, unspecified: Secondary | ICD-10-CM

## 2018-12-21 DIAGNOSIS — E039 Hypothyroidism, unspecified: Secondary | ICD-10-CM | POA: Diagnosis not present

## 2018-12-21 DIAGNOSIS — G3184 Mild cognitive impairment, so stated: Secondary | ICD-10-CM

## 2018-12-21 DIAGNOSIS — I10 Essential (primary) hypertension: Secondary | ICD-10-CM | POA: Diagnosis not present

## 2018-12-21 MED ORDER — VALSARTAN 40 MG PO TABS
40.0000 mg | ORAL_TABLET | Freq: Every day | ORAL | Status: DC
Start: 2018-12-21 — End: 2019-06-07

## 2018-12-21 NOTE — Assessment & Plan Note (Signed)
Lab Results  Component Value Date   TSH 3.44 05/12/2018   con't meds

## 2018-12-21 NOTE — Assessment & Plan Note (Signed)
Running low

## 2018-12-21 NOTE — Assessment & Plan Note (Signed)
F/u neuro  

## 2018-12-21 NOTE — Progress Notes (Signed)
Virtual Visit via Video Note  I connected with [name] on 12/21/18 at 10:30 AM EDT by a video enabled telemedicine application and verified that I am speaking with the correct person using two identifiers.   I discussed the limitations of evaluation and management by telemedicine and the availability of in person appointments. The patient expressed understanding and agreed to proceed.  History of Present Illness: Pt is home with her husband having no complaints.   She needs f/u and no labs due at this time    Observations/Objective: bp  94/50  p 70  137 lbs  Flat affect Pt in NAD   Assessment and Plan: 1. Essential hypertension Running low---  diovan low to 40 mg daily F/u 2-3 weeks  - valsartan (DIOVAN) 40 MG tablet; Take 1 tablet (40 mg total) by mouth daily.  2. Hyperlipidemia, unspecified hyperlipidemia type Tolerating statin, encouraged heart healthy diet, avoid trans fats, minimize simple carbs and saturated fats. Increase exercise as tolerated  3. Hypothyroidism, unspecified type Lab Results  Component Value Date   TSH 3.44 05/12/2018   Stable --- con't meds  4. Mild cognitive impairment Per neuro  Follow Up Instructions:    I discussed the assessment and treatment plan with the patient. The patient was provided an opportunity to ask questions and all were answered. The patient agreed with the plan and demonstrated an understanding of the instructions.   The patient was advised to call back or seek an in-person evaluation if the symptoms worsen or if the condition fails to improve as anticipated.     Ann Held, DO

## 2018-12-21 NOTE — Assessment & Plan Note (Signed)
Encouraged heart healthy diet, increase exercise, avoid trans fats, consider a krill oil cap daily 

## 2018-12-22 ENCOUNTER — Telehealth: Payer: Self-pay | Admitting: *Deleted

## 2018-12-22 NOTE — Telephone Encounter (Signed)
Received Dermatopathology Report results from [facility]; forwarded to provider/SLS 04/15

## 2018-12-24 ENCOUNTER — Other Ambulatory Visit: Payer: Self-pay | Admitting: Family Medicine

## 2018-12-24 DIAGNOSIS — F321 Major depressive disorder, single episode, moderate: Secondary | ICD-10-CM

## 2019-01-24 NOTE — Progress Notes (Signed)
Virtual Visit via Video Note  I connected with patient on 01/25/19 at 11:00 AM EDT by a video enabled telemedicine application and verified that I am speaking with the correct person using two identifiers.   THIS ENCOUNTER IS A VIRTUAL VISIT DUE TO COVID-19 - PATIENT WAS NOT SEEN IN THE OFFICE. PATIENT HAS CONSENTED TO VIRTUAL VISIT / TELEMEDICINE VISIT   Location of patient: home  Location of provider: office  I discussed the limitations of evaluation and management by telemedicine and the availability of in person appointments. The patient expressed understanding and agreed to proceed.   Subjective:   Michelle [name] is a 73 y.o. female who presents for Medicare Annual (Subsequent) preventive examination.  Review of Systems: No ROS.  Medicare Wellness Virtual Visit.  Visual/audio telehealth visit, UTA vital signs.   See social history for additional risk factors. Cardiac Risk Factors include: advanced age (>43men, >37 women);dyslipidemia;hypertension Sleep patterns: no issues Home Safety/Smoke Alarms: Feels safe in home. Smoke alarms in place.  Lives with her husband in 70 story home and daughter lives behind them.   Female:     Mammo- 01/25/18 ordered     Dexa scan-  11/12/14. ordered      CCS- 12/10/16. due 2021.     Objective:      Advanced Directives 01/25/2019 03/09/2018 02/12/2018 01/18/2018 05/08/2017 10/31/2016 10/21/2016  Does Patient Have a Medical Advance Directive? Yes Yes Yes Yes No Yes Yes  Type of Paramedic of [place];Living will Healthcare Power of [place] of [place];Living will - [place];Living will [place];Living will  Does patient want to make changes to medical advance directive? No - Patient declined No - Patient declined Yes (MAU/Ambulatory/Procedural Areas - Information given) No - Patient declined - - -  Copy of [place] in Chart?  Yes - validated most recent copy scanned in chart (See row information) Yes Yes No - copy requested - No - copy requested -  Would patient like information on creating a medical advance directive? - - - - No - Patient declined - -    Tobacco Social History   Tobacco Use  Smoking Status Never Smoker  Smokeless Tobacco Never Used     Counseling given: Not Answered   Clinical Intake:     Pain : No/denies pain                 Past Medical History:  Diagnosis Date  . Adenomatous colon polyp   . Allergy   . GERD (gastroesophageal reflux disease)   . Glaucoma   . Hx of hearing loss    bilateral hearing aids  . Hyperlipidemia   . Hypertension   . IBS (irritable bowel syndrome)   . Low back pain   . Thyroid disease    Past Surgical History:  Procedure Laterality Date  . ABDOMINAL HYSTERECTOMY  1998  . CATARACT EXTRACTION, BILATERAL Bilateral 04/2015  . CHOLECYSTECTOMY  1998  . [place] / 2013   skin graft on gums  . TUBAL LIGATION  1977   Family History  Problem Relation Age of Onset  . Macular degeneration Father   . Prostate cancer Father   . Hypertension Father   . Cancer Father        prostate  . Glaucoma Mother   . Emphysema Mother   . Hypertension Mother   . Other Brother  Brain tumor  . Cancer Brother 73       brain tumor  . Breast cancer Maternal Grandmother   . Prostate cancer Brother    Social History   Socioeconomic History  . Marital status: Married    Spouse name: Juanda Crumble, "Butch"  . Number of children: 2  . Years of education: Not on file  . Highest education level: Not on file  Occupational History  . Occupation: retired    Fish farm manager: UNEMPLOYED  Social Needs  . Financial resource strain: Not on file  . Food insecurity:    Worry: Not on file    Inability: Not on file  . Transportation needs:    Medical: Not on file    Non-medical: Not on file  Tobacco Use  . Smoking status: Never Smoker  . Smokeless  tobacco: Never Used  Substance and Sexual Activity  . Alcohol use: No  . Drug use: No  . Sexual activity: Yes    Partners: Male    Birth control/protection: Surgical  Lifestyle  . Physical activity:    Days per week: Not on file    Minutes per session: Not on file  . Stress: Not on file  Relationships  . Social connections:    Talks on phone: Not on file    Gets together: Not on file    Attends religious service: Not on file    Active member of club or organization: Not on file    Attends meetings of clubs or organizations: Not on file    Relationship status: Not on file  Other Topics Concern  . Not on file  Social History Narrative   Exercise--no    Outpatient Encounter Medications as of 01/25/2019  Medication Sig  . ALPHAGAN P 0.1 % SOLN   . AMBULATORY NON FORMULARY MEDICATION IBgard: Takes prn  . aspirin 81 MG tablet Take 81 mg by mouth daily.   Marland Kitchen donepezil (ARICEPT) 10 MG tablet Take 1 tablet daily  . fluticasone (FLONASE) 50 MCG/ACT nasal spray Place 2 sprays into both nostrils daily.  Marland Kitchen gabapentin (NEURONTIN) 300 MG capsule Take 2 capsules daily  . HYDROcodone-acetaminophen (NORCO/VICODIN) 5-325 MG tablet Take 1 tablet by mouth every 6 (six) hours as needed for moderate pain.  Marland Kitchen levothyroxine (SYNTHROID, LEVOTHROID) 50 MCG tablet Take 1 tablet (50 mcg total) by mouth daily.  Marland Kitchen loratadine (CLARITIN) 10 MG tablet Take 1 tablet (10 mg total) by mouth daily.  . memantine (NAMENDA) 10 MG tablet Take 1 tablet (10 mg total) by mouth 2 (two) times daily.  . Multiple Vitamin (MULTIVITAMIN) tablet Take 1 tablet by mouth daily.  . pravastatin (PRAVACHOL) 20 MG tablet Take 1 tablet (20 mg total) by mouth daily.  . sertraline (ZOLOFT) 50 MG tablet TAKE 1 TABLET BY MOUTH EVERY DAY  . valsartan (DIOVAN) 40 MG tablet Take 1 tablet (40 mg total) by mouth daily.  . benzonatate (TESSALON) 200 MG capsule Take 1 capsule (200 mg total) by mouth 3 (three) times daily as needed for cough.  (Patient not taking: Reported on 01/25/2019)   No facility-administered encounter medications on file as of 01/25/2019.     Activities of Daily Living In your present state of health, do you have any difficulty performing the following activities: 01/25/2019  Hearing? N  Vision? N  Difficulty concentrating or making decisions? N  Walking or climbing stairs? N  Dressing or bathing? N  Doing errands, shopping? N  Preparing Food and eating ? N  Using the  Toilet? N  In the past six months, have you accidently leaked urine? N  Do you have problems with loss of bowel control? N  Managing your Medications? N  Managing your Finances? N  Housekeeping or managing your Housekeeping? N  Some recent data might be hidden    Patient Care Team: Carollee Herter, Alferd Apa, DO as PCP - General Calvert Cantor, MD as Consulting Physician (Ophthalmology) Irene Shipper, MD as Consulting Physician (Gastroenterology) Cameron Sprang, MD as Consulting Physician (Neurology)    Assessment:   This is a routine wellness examination for [place]. Physical assessment deferred to PCP.  Exercise Activities and Dietary recommendations Current Exercise Habits: The patient does not participate in regular exercise at present, Exercise limited by: None identified Diet (meal preparation, eat out, water intake, caffeinated beverages, dairy products, fruits and vegetables): in general, an "unhealthy" diet, on average, 2 meals per day     Needs to drink more water.  Goals    . Increase physical activity    . would like to tone legs and arms       Fall Risk Fall Risk  01/25/2019 10/21/2018 03/15/2018 01/18/2018 10/13/2017  Falls in the past year? 1 0 No No No  Number falls in past yr: 0 0 - - -  Injury with Fall? 0 0 - - -     Depression Screen PHQ 2/9 Scores 01/25/2019 01/18/2018 10/31/2016 10/31/2016  PHQ - 2 Score 0 1 0 0      MMSE - Mini Mental State Exam 01/25/2019 10/21/2018 03/15/2018 01/18/2018 10/31/2016  Not completed:  Unable to complete - - - -  Orientation to time - 3 2 5 5   Orientation to Place - 5 5 5 5   Registration - 3 3 3 3   Attention/ Calculation - 1 5 5 5   Recall - 0 0 0 0  Language- name 2 objects - 2 2 2 2   Language- repeat - 1 1 1  0  Language- follow 3 step command - 3 3 2 3   Language- read & follow direction - 1 1 1 1   Write a sentence - 1 1 1 1   Copy design - 1 1 0 1  Total score - 21 24 25 26    Montreal Cognitive Assessment  04/02/2016  Visuospatial/ Executive (0/5) 3  Naming (0/3) 3  Attention: Read list of digits (0/2) 1  Attention: Read list of letters (0/1) 1  Attention: Serial 7 subtraction starting at 100 (0/3) 3  Language: Repeat phrase (0/2) 2  Language : Fluency (0/1) 1  Abstraction (0/2) 1  Delayed Recall (0/5) 1  Orientation (0/6) 5  Total 21  Adjusted Score (based on education) 21      Immunization History  Administered Date(s) Administered  . Influenza Split 09/23/2011  . Influenza Whole 07/26/2007, 06/30/2008, 07/26/2009, 09/12/2010  . Influenza, High Dose Seasonal PF 06/15/2013, 10/05/2015, 07/20/2017, 05/21/2018  . Influenza,inj,Quad PF,6+ Mos 06/19/2014, 10/02/2016  . Pneumococcal Conjugate-13 11/03/2014  . Pneumococcal Polysaccharide-23 09/23/2011  . Td 07/02/2005  . Zoster 11/14/2008    Screening Tests Health Maintenance  Topic Date Due  . Hepatitis C Screening  1945/11/25  . TETANUS/TDAP  07/03/2015  . PAP SMEAR-Modifier  10/31/2018  . INFLUENZA VACCINE  04/09/2019  . COLONOSCOPY  12/11/2019  . MAMMOGRAM  01/26/2020  . DEXA SCAN  Completed  . PNA vac Low Risk Adult  Completed       Plan:   See you next year.   Continue to eat  heart healthy diet (full of fruits, vegetables, whole grains, lean protein, water--limit salt, fat, and sugar intake) and increase physical activity as tolerated.  Continue doing brain stimulating activities (puzzles, reading, adult coloring books, staying active) to keep memory sharp.   I have ordered your  mammogram and bone density scan. Please schedule.  I have personally reviewed and noted the following in the patient's chart:   . Medical and social history . Use of alcohol, tobacco or illicit drugs  . Current medications and supplements . Functional ability and status . Nutritional status . Physical activity . Advanced directives . List of other physicians . Hospitalizations, surgeries, and ER visits in previous 12 months . Vitals . Screenings to include cognitive, depression, and falls . Referrals and appointments  In addition, I have reviewed and discussed with patient certain preventive protocols, quality metrics, and best practice recommendations. A written personalized care plan for preventive services as well as general preventive health recommendations were provided to patient.     Shela Nevin, [place]  01/25/2019

## 2019-01-25 ENCOUNTER — Encounter: Payer: Self-pay | Admitting: *Deleted

## 2019-01-25 ENCOUNTER — Ambulatory Visit (INDEPENDENT_AMBULATORY_CARE_PROVIDER_SITE_OTHER): Payer: Medicare HMO | Admitting: *Deleted

## 2019-01-25 ENCOUNTER — Other Ambulatory Visit: Payer: Self-pay

## 2019-01-25 DIAGNOSIS — Z780 Asymptomatic menopausal state: Secondary | ICD-10-CM

## 2019-01-25 DIAGNOSIS — Z0000 Encounter for general adult medical examination without abnormal findings: Secondary | ICD-10-CM

## 2019-01-25 DIAGNOSIS — Z1231 Encounter for screening mammogram for malignant neoplasm of breast: Secondary | ICD-10-CM

## 2019-01-25 NOTE — Patient Instructions (Signed)
See you next year.   Continue to eat heart healthy diet (full of fruits, vegetables, whole grains, lean protein, water--limit salt, fat, and sugar intake) and increase physical activity as tolerated.  Continue doing brain stimulating activities (puzzles, reading, adult coloring books, staying active) to keep memory sharp.   I have ordered your mammogram and bone density scan. Please schedule.   Michelle [name] , Thank you for taking time to come for your Medicare Wellness Visit. I appreciate your ongoing commitment to your health goals. Please review the following plan we discussed and let me know if I can assist you in the future.   These are the goals we discussed: Goals    . Increase physical activity    . would like to tone legs and arms       This is a list of the screening recommended for you and due dates:  Health Maintenance  Topic Date Due  .  Hepatitis C: One time screening is recommended by Center for Disease Control  (CDC) for  adults born from [birth year] through 1965.   [birth year]  . Tetanus Vaccine  07/03/2015  . Pap Smear  10/31/2018  . Flu Shot  04/09/2019  . Colon Cancer Screening  12/11/2019  . Mammogram  01/26/2020  . DEXA scan (bone density measurement)  Completed  . Pneumonia vaccines  Completed    Health Maintenance After Age 60 After age 47, you are at a higher risk for certain long-term diseases and infections as well as injuries from falls. Falls are a major cause of broken bones and head injuries in people who are older than age 74. Getting regular preventive care can help to keep you healthy and well. Preventive care includes getting regular testing and making lifestyle changes as recommended by your health care provider. Talk with your health care provider about:  Which screenings and tests you should have. A screening is a test that checks for a disease when you have no symptoms.  A diet and exercise plan that is right for you. What should I know about  screenings and tests to prevent falls? Screening and testing are the best ways to find a health problem early. Early diagnosis and treatment give you the best chance of managing medical conditions that are common after age 39. Certain conditions and lifestyle choices may make you more likely to have a fall. Your health care provider may recommend:  Regular vision checks. Poor vision and conditions such as cataracts can make you more likely to have a fall. If you wear glasses, make sure to get your prescription updated if your vision changes.  Medicine review. Work with your health care provider to regularly review all of the medicines you are taking, including over-the-counter medicines. Ask your health care provider about any side effects that may make you more likely to have a fall. Tell your health care provider if any medicines that you take make you feel dizzy or sleepy.  Osteoporosis screening. Osteoporosis is a condition that causes the bones to get weaker. This can make the bones weak and cause them to break more easily.  Blood pressure screening. Blood pressure changes and medicines to control blood pressure can make you feel dizzy.  Strength and balance checks. Your health care provider may recommend certain tests to check your strength and balance while standing, walking, or changing positions.  Foot health exam. Foot pain and numbness, as well as not wearing proper footwear, can make you more likely to have  a fall.  Depression screening. You may be more likely to have a fall if you have a fear of falling, feel emotionally low, or feel unable to do activities that you used to do.  Alcohol use screening. Using too much alcohol can affect your balance and may make you more likely to have a fall. What actions can I take to lower my risk of falls? General instructions  Talk with your health care provider about your risks for falling. Tell your health care provider if: ? You fall. Be sure  to tell your health care provider about all falls, even ones that seem minor. ? You feel dizzy, sleepy, or off-balance.  Take over-the-counter and prescription medicines only as told by your health care provider. These include any supplements.  Eat a healthy diet and maintain a healthy weight. A healthy diet includes low-fat dairy products, low-fat (lean) meats, and fiber from whole grains, beans, and lots of fruits and vegetables. Home safety  Remove any tripping hazards, such as rugs, cords, and clutter.  Install safety equipment such as grab bars in bathrooms and safety rails on stairs.  Keep rooms and walkways well-lit. Activity   Follow a regular exercise program to stay fit. This will help you maintain your balance. Ask your health care provider what types of exercise are appropriate for you.  If you need a cane or walker, use it as recommended by your health care provider.  Wear supportive shoes that have nonskid soles. Lifestyle  Do not drink alcohol if your health care provider tells you not to drink.  If you drink alcohol, limit how much you have: ? 0-1 drink a day for women. ? 0-2 drinks a day for men.  Be aware of how much alcohol is in your drink. In the [place] one drink equals one typical bottle of beer (12 oz), one-half glass of wine (5 oz), or one shot of hard liquor (1 oz).  Do not use any products that contain nicotine or tobacco, such as cigarettes and e-cigarettes. If you need help quitting, ask your health care provider. Summary  Having a healthy lifestyle and getting preventive care can help to protect your health and wellness after age 35.  Screening and testing are the best way to find a health problem early and help you avoid having a fall. Early diagnosis and treatment give you the best chance for managing medical conditions that are more common for people who are older than age 87.  Falls are a major cause of broken bones and head injuries in people  who are older than age 39. Take precautions to prevent a fall at home.  Work with your health care provider to learn what changes you can make to improve your health and wellness and to prevent falls. This information is not intended to replace advice given to you by your health care provider. Make sure you discuss any questions you have with your health care provider. Document Released: 07/08/2017 Document Revised: 07/08/2017 Document Reviewed: 07/08/2017 Elsevier Interactive Patient Education  2019 [employer].

## 2019-03-18 DIAGNOSIS — S8265XA Nondisplaced fracture of lateral malleolus of left fibula, initial encounter for closed fracture: Secondary | ICD-10-CM | POA: Diagnosis not present

## 2019-03-18 DIAGNOSIS — M25572 Pain in left ankle and joints of left foot: Secondary | ICD-10-CM | POA: Diagnosis not present

## 2019-03-24 ENCOUNTER — Telehealth: Payer: Self-pay | Admitting: Neurology

## 2019-03-24 NOTE — Telephone Encounter (Signed)
Pls order MRI brain with and without contrast, Dx: Meningioma and Worsening Headaches. Thanks

## 2019-03-24 NOTE — Telephone Encounter (Signed)
Daughter called and daughter states her memory is progressing and having some headaches, but isn't all the time, some confusion.Requesting, MRI, please advise

## 2019-03-24 NOTE — Telephone Encounter (Signed)
Daughter is calling in about MRI. She said she wants this ordered so that she can have that completed before the visit in September. Thanks!

## 2019-03-25 ENCOUNTER — Other Ambulatory Visit: Payer: Self-pay

## 2019-03-25 DIAGNOSIS — F0280 Dementia in other diseases classified elsewhere without behavioral disturbance: Secondary | ICD-10-CM

## 2019-03-25 DIAGNOSIS — G309 Alzheimer's disease, unspecified: Secondary | ICD-10-CM

## 2019-03-25 DIAGNOSIS — D329 Benign neoplasm of meninges, unspecified: Secondary | ICD-10-CM

## 2019-03-25 NOTE — Telephone Encounter (Signed)
Left message on voicemail to let her know the mri will be order and they will be in touch regarding her appt time.

## 2019-03-31 ENCOUNTER — Other Ambulatory Visit: Payer: Self-pay | Admitting: Family Medicine

## 2019-03-31 DIAGNOSIS — I10 Essential (primary) hypertension: Secondary | ICD-10-CM

## 2019-04-11 ENCOUNTER — Other Ambulatory Visit: Payer: Self-pay | Admitting: Neurology

## 2019-04-11 DIAGNOSIS — G44209 Tension-type headache, unspecified, not intractable: Secondary | ICD-10-CM

## 2019-04-11 NOTE — Telephone Encounter (Signed)
Requested Prescriptions   Pending Prescriptions Disp Refills  . gabapentin (NEURONTIN) 300 MG capsule [Pharmacy Med Name: GABAPENTIN 300 MG CAPSULE] 90 capsule 3    Sig: TAKE 1 CAPSULE BY MOUTH EVERY DAY   Rx last filled:10/21/18 #180 3 REFILLS  Pt last seen:10/21/18  IMPRESSION: This is a pleasant 73yo RH womanwith a history of hypertension, hyperlipidemia, hypothyroidism, with mild dementia, likely due to Alzheimer's disease. There is continued gradual decline, MMSE today 21/30 (24/30 in July 2019). She is on Donepezil 10mg  daily and Memantine 10mg  BID. Continue to monitor driving, she was advised to have family increase medication supervision. She is reporting more headaches and will increase gabapentin 300mg  to 2 tabs qhs. We again discussed the importance of control of vascular risk factors, physical exercise, and brain stimulation exercises for brain health. Follow-up in 6 months, they know to call for any changes.   Follow up appt scheduled: 3/81/01  DENIED DUPLICATE REQUEST TO SOON

## 2019-04-15 DIAGNOSIS — M25572 Pain in left ankle and joints of left foot: Secondary | ICD-10-CM | POA: Diagnosis not present

## 2019-05-06 ENCOUNTER — Ambulatory Visit
Admission: RE | Admit: 2019-05-06 | Discharge: 2019-05-06 | Disposition: A | Discharge status: Home / Self Care | Payer: Medicare HMO | Source: Ambulatory Visit | Attending: Neurology | Admitting: Neurology

## 2019-05-06 ENCOUNTER — Other Ambulatory Visit: Payer: Self-pay

## 2019-05-06 DIAGNOSIS — R51 Headache: Secondary | ICD-10-CM | POA: Diagnosis not present

## 2019-05-06 DIAGNOSIS — D320 Benign neoplasm of cerebral meninges: Secondary | ICD-10-CM | POA: Diagnosis not present

## 2019-05-06 DIAGNOSIS — G309 Alzheimer's disease, unspecified: Secondary | ICD-10-CM

## 2019-05-06 DIAGNOSIS — R413 Other amnesia: Secondary | ICD-10-CM | POA: Diagnosis not present

## 2019-05-06 DIAGNOSIS — D329 Benign neoplasm of meninges, unspecified: Secondary | ICD-10-CM

## 2019-05-06 DIAGNOSIS — F0280 Dementia in other diseases classified elsewhere without behavioral disturbance: Secondary | ICD-10-CM

## 2019-05-06 MED ORDER — GADOBENATE DIMEGLUMINE 529 MG/ML IV SOLN
11.0000 mL | Freq: Once | INTRAVENOUS | Status: AC | PRN
  Administered 2019-05-06: 11 mL via INTRAVENOUS

## 2019-05-09 ENCOUNTER — Telehealth: Payer: Self-pay

## 2019-05-09 NOTE — Telephone Encounter (Signed)
Left message informing of no changes since 2018 and to call back with any concerns.

## 2019-05-09 NOTE — Telephone Encounter (Signed)
-----   Message from Cameron Sprang, MD sent at 05/09/2019 10:10 AM EDT ----- Pls let husband/daughter know that the MRI brain did not show any significant changes from her last MRI in 2018. Thanks

## 2019-05-27 ENCOUNTER — Other Ambulatory Visit: Payer: Self-pay | Admitting: Family Medicine

## 2019-05-27 DIAGNOSIS — E785 Hyperlipidemia, unspecified: Secondary | ICD-10-CM

## 2019-05-27 DIAGNOSIS — M25572 Pain in left ankle and joints of left foot: Secondary | ICD-10-CM | POA: Diagnosis not present

## 2019-06-01 ENCOUNTER — Encounter: Payer: Self-pay | Admitting: Neurology

## 2019-06-01 ENCOUNTER — Other Ambulatory Visit: Payer: Self-pay

## 2019-06-01 ENCOUNTER — Ambulatory Visit: Payer: Medicare HMO | Admitting: Neurology

## 2019-06-01 DIAGNOSIS — G301 Alzheimer's disease with late onset: Secondary | ICD-10-CM

## 2019-06-01 DIAGNOSIS — F0280 Dementia in other diseases classified elsewhere without behavioral disturbance: Secondary | ICD-10-CM | POA: Diagnosis not present

## 2019-06-01 DIAGNOSIS — R69 Illness, unspecified: Secondary | ICD-10-CM | POA: Diagnosis not present

## 2019-06-01 DIAGNOSIS — G44209 Tension-type headache, unspecified, not intractable: Secondary | ICD-10-CM

## 2019-06-01 MED ORDER — DONEPEZIL HCL 10 MG PO TABS: ORAL_TABLET | ORAL | 3 refills | Status: DC

## 2019-06-01 MED ORDER — GABAPENTIN 300 MG PO CAPS: ORAL_CAPSULE | ORAL | 3 refills | Status: DC

## 2019-06-01 MED ORDER — MEMANTINE HCL 10 MG PO TABS: 10.0000 mg | ORAL_TABLET | Freq: Two times a day (BID) | ORAL | 3 refills | Status: DC

## 2019-06-01 MED ORDER — SERTRALINE HCL 100 MG PO TABS: 100.0000 mg | ORAL_TABLET | Freq: Every day | ORAL | 3 refills | Status: DC

## 2019-06-01 NOTE — Patient Instructions (Signed)
1. Take the gabapentin 300mg : take 1 twice a day  2. Continue Donepezil 10mg  daily and Memantine 10mg  twice a day  3. Exercise and regular meals are important for brain health  4. Follow-up in 6 months, call for any changes  FALL PRECAUTIONS: Be cautious when walking. Scan the area for obstacles that may increase the risk of trips and falls. When getting up in the mornings, sit up at the edge of the bed for a few minutes before getting out of bed. Consider elevating the bed at the head end to avoid drop of blood pressure when getting up. Walk always in a well-lit room (use night lights in the walls). Avoid area rugs or power cords from appliances in the middle of the walkways. Use a walker or a cane if necessary and consider physical therapy for balance exercise. Get your eyesight checked regularly.  HOME SAFETY: Consider the safety of the kitchen when operating appliances like stoves, microwave oven, and blender. Consider having supervision and share cooking responsibilities until no longer able to participate in those. Accidents with firearms and other hazards in the house should be identified and addressed as well.  ABILITY TO BE LEFT ALONE: If patient is unable to contact 911 operator, consider using LifeLine, or when the need is there, arrange for someone to stay with patients. Smoking is a fire hazard, consider supervision or cessation. Risk of wandering should be assessed by caregiver and if detected at any point, supervision and safe proof recommendations should be instituted.   RECOMMENDATIONS FOR ALL PATIENTS WITH MEMORY PROBLEMS: 1. Continue to exercise (Recommend 30 minutes of walking everyday, or 3 hours every week) 2. Increase social interactions - continue going to [place] and enjoy social gatherings with friends and family 3. Eat healthy, avoid fried foods and eat more fruits and vegetables 4. Maintain adequate blood pressure, blood sugar, and blood cholesterol level. Reducing the  risk of stroke and cardiovascular disease also helps promoting better memory. 5. Avoid stressful situations. Live a simple life and avoid aggravations. Organize your time and prepare for the next day in anticipation. 6. Sleep well, avoid any interruptions of sleep and avoid any distractions in the bedroom that may interfere with adequate sleep quality 7. Avoid sugar, avoid sweets as there is a strong link between excessive sugar intake, diabetes, and cognitive impairment The Mediterranean diet has been shown to help patients reduce the risk of progressive memory disorders and reduces cardiovascular risk. This includes eating fish, eat fruits and green leafy vegetables, nuts like almonds and hazelnuts, walnuts, and also use olive oil. Avoid fast foods and fried foods as much as possible. Avoid sweets and sugar as sugar use has been linked to worsening of memory function.  There is always a concern of gradual progression of memory problems. If this is the case, then we may need to adjust level of care according to patient needs. Support, both to the patient and caregiver, should then be put into place.

## 2019-06-01 NOTE — Progress Notes (Signed)
NEUROLOGY FOLLOW UP OFFICE NOTE  Michelle [name] [id number]  DOB: [birth year]  HISTORY OF PRESENT ILLNESS: I had the pleasure of seeing Michelle [name] in follow-up in the neurology clinic on 06/01/2019. She is again accompanied by her husband who helps supplement the history today. The patient was last seen 7 months ago for mild dementia. MMSE 21/30 in February 2020. She os pm Donepezil 10mg  daily and Memantine 10mg  BID without side effects. She had a follow-up MRI brain with and without contrast last 04/2019 for worsening headaches and confusion. I personally reviewed the study which did not show any acute changes, there was minimal change from last MRI in 2018 with mild to moderate diffuse atrophy, mild chronic microvascular disease. The meningioma along the dorsal aspect of the clivus to the right of midline was not significantly changed, 1.9 by 1.0 x 2.5 cm, with mass effect on pons and midbrain unchanged. She is taking gabapentin 600mg  qhs with no side effects.   Since her last visit, she feels her memory is good. Her husband reports continued decline, she repeats herself more frequently. Her husband does her medications 70% of the time, she is not taking them as supposed to or mixes up day and night. Family manages finances. She does not drive. She sweeps the sundeck 2-3 times a day and has not been exercising. Family has noticed she does not talk a lot, she would not strike up a conversation. He has noticed she is more disinhibited expressing her love for him, kissing his hand at night. She does not feel she is depressed, "mood is A-," but her husband reports she said she felt "like blah" sometimes. No paranoia or hallucinations. Sleep and appetite are good, her husband reminds her that she eats mostly junk food. She is independent with dressing and bathing. She continues to have headaches every once in a while, her husband reminds her she had them daily the past week, mostly when waking up in the  morning. He has tried giving the gabapentin BID instead of 2 qhs. This may cause her to have more naps, he is not sure. He reports she was more unstable when they were at the beach last July, there were 2 days she almost fell as the waves hit her feet, she had not eaten that day and felt lightheaded and wobbly. This has not recurred. She had a fall a few months ago and twisted her left ankle.  History on Initial Assessment 04/02/2016: This is a pleasant 73 yo RH woman with a history of hypertension, hyperlipidemia, hypothyroidism, who presented for evaluation of memory loss. Her daughter started noticing memory changes a year ago. The patient states she can tell a difference in her memory, but that her family notices it more and "they may think I'm in denial." Her family has noticed worsening over the past 6 months, she repeats herself several times, sometimes having the same conversation four times within an hour. Her husband reports that she asked him repeatedly last 73th of July weekend what he was wearing to go home. She misplaces things frequently in the house. She left the stove on one time boiling water. She takes longer to get ready to go out. She denies getting lost driving, no missed bills or medications. 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 Her family also expressed concern about personality changes, she used to be laidback but now her  patience is not there. She is cussing a lot more easily now. She doesn't want to do things she used to like. She does not like going out, and stayed in the room while at the beach more than with her family. No paranoia. No difficulties with ADLs. There is no family history of dementia, she denies any history of head injuries, alcohol intake. She has been having dull right frontal headaches for the past 6 months attributed to her sinuses, no associated nausea/vomiting. She denies any dizziness,  diplopia, dysarthria/dysphagia, focal numbness/tingling/weakness, bowel/bladder dysfunction. She has occasional neck and left shoulder pain. No falls.   She had an MMSE done with her PCP this month, MMSE 25/30. TSH and B12 unremarkable. She has been unable to do the MRI brain.  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 Post-contrast MRI was ordered, which again showed the 6mm enhancing dural-based mass posterior to the clivus on the right. There is mass effect on the anterior pons on the right which is mildly compressed. The mass is touching the basilar causing mild displacement to the left. There is no definite invasion of the sella. It may be touching the cisternal segment of the trigeminal nerve.   PAST MEDICAL HISTORY: Past Medical History:  Diagnosis Date   Adenomatous colon polyp    Allergy    GERD (gastroesophageal reflux disease)    Glaucoma    Hx of hearing loss    bilateral hearing aids   Hyperlipidemia    Hypertension    IBS (irritable bowel syndrome)    Low back pain    Thyroid disease     MEDICATIONS: Current Outpatient Medications on File Prior to Visit  Medication Sig Dispense Refill   ALPHAGAN P 0.1 % SOLN      AMBULATORY NON FORMULARY MEDICATION IBgard: Takes prn     aspirin 81 MG tablet Take 81 mg by mouth daily.      benzonatate (TESSALON) 200 MG capsule Take 1 capsule (200 mg total) by mouth 3 (three) times daily as needed for cough. 90 capsule 1   donepezil (ARICEPT) 10 MG tablet Take 1 tablet daily 90 tablet 3   fluticasone (FLONASE) 50 MCG/ACT nasal spray Place 2 sprays into both nostrils daily. 16 g 5   gabapentin (NEURONTIN) 300 MG capsule Take 2 capsules daily 180 capsule 3   HYDROcodone-acetaminophen (NORCO/VICODIN) 5-325 MG tablet Take 1 tablet by mouth every 6 (six) hours as needed for moderate pain. 30  tablet 0   levothyroxine (SYNTHROID, LEVOTHROID) 50 MCG tablet Take 1 tablet (50 mcg total) by mouth daily. 90 tablet 3   loratadine (CLARITIN) 10 MG tablet Take 1 tablet (10 mg total) by mouth daily. 90 tablet 1   memantine (NAMENDA) 10 MG tablet Take 1 tablet (10 mg total) by mouth 2 (two) times daily. 180 tablet 3   Multiple Vitamin (MULTIVITAMIN) tablet Take 1 tablet by mouth daily.     pravastatin (PRAVACHOL) 20 MG tablet TAKE 1 TABLET BY MOUTH EVERY DAY 90 tablet 3   sertraline (ZOLOFT) 50 MG tablet TAKE 1 TABLET BY MOUTH EVERY DAY 90 tablet 1   valsartan (DIOVAN) 40 MG tablet Take 1 tablet (40 mg total) by mouth daily.     No current facility-administered medications on file prior to visit.     ALLERGIES: Allergies  Allergen Reactions   No Known Allergies  FAMILY HISTORY: Family History  Problem Relation Age of Onset   Macular degeneration Father    Prostate cancer Father    Hypertension Father    Cancer Father        prostate   Glaucoma Mother    Emphysema Mother    Hypertension Mother    Other Brother        Brain tumor   Cancer Brother 39       brain tumor   Breast cancer Maternal Grandmother    Prostate cancer Brother     SOCIAL HISTORY: Social History   Socioeconomic History   Marital status: Married    Spouse name: Juanda Crumble, "Butch"   Number of children: 2   Years of education: Not on file   Highest education level: Not on file  Occupational History   Occupation: retired    Fish farm manager: UNEMPLOYED  Social Designer, fashion/clothing strain: Not on file   Food insecurity    Worry: Not on file    Inability: Not on file   Transportation needs    Medical: Not on file    Non-medical: Not on file  Tobacco Use   Smoking status: Never Smoker   Smokeless tobacco: Never Used  Substance and Sexual Activity   Alcohol use: No   Drug use: No   Sexual activity: Yes    Partners: Male    Birth control/protection: Surgical   Lifestyle   Physical activity    Days per week: Not on file    Minutes per session: Not on file   Stress: Not on file  Relationships   Social connections    Talks on phone: Not on file    Gets together: Not on file    Attends religious service: Not on file    Active member of club or organization: Not on file    Attends meetings of clubs or organizations: Not on file    Relationship status: Not on file   Intimate partner violence    Fear of current or ex partner: Not on file    Emotionally abused: Not on file    Physically abused: Not on file    Forced sexual activity: Not on file  Other Topics Concern   Not on file  Social History Narrative   Exercise--no    REVIEW OF SYSTEMS: Constitutional: No fevers, chills, or sweats, no generalized fatigue, change in appetite Eyes: No visual changes, double vision, eye pain Ear, nose and throat: No hearing loss, ear pain, nasal congestion, sore throat Cardiovascular: No chest pain, palpitations Respiratory:  No shortness of breath at rest or with exertion, wheezes GastrointestinaI: No nausea, vomiting, diarrhea, abdominal pain, fecal incontinence Genitourinary:  No dysuria, urinary retention or frequency Musculoskeletal:  No neck pain, back pain Integumentary: No rash, pruritus, skin lesions Neurological: as above Psychiatric: No depression, insomnia, anxiety Endocrine: No palpitations, fatigue, diaphoresis, mood swings, change in appetite, change in weight, increased thirst Hematologic/Lymphatic:  No anemia, purpura, petechiae. Allergic/Immunologic: no itchy/runny eyes, nasal congestion, recent allergic reactions, rashes  PHYSICAL EXAM: Vitals:   06/01/19 1012  BP: (!) 90/52  Pulse: 66  Resp: 12  Temp: 97.7 F (36.5 C)  SpO2: 94%   General: No acute distress Skin/Extremities: No rash, no edema Neurological Exam: alert and oriented to person, month/year. Did not know city. No aphasia or dysarthria. Fund of knowledge is  reduced.  Remote and recent memory impaired. Attention and concentration are reduced. Able to name objects, difficulty with repetition.  Montreal Cognitive Assessment  06/03/2019 04/02/2016  Visuospatial/ Executive (0/5) 1 3  Naming (0/3) 2 3  Attention: Read list of digits (0/2) 2 1  Attention: Read list of letters (0/1) 1 1  Attention: Serial 7 subtraction starting at 100 (0/3) 0 3  Language: Repeat phrase (0/2) 0 2  Language : Fluency (0/1) 0 1  Abstraction (0/2) 0 1  Delayed Recall (0/5) 0 1  Orientation (0/6) 3 5  Total 9 21  Adjusted Score (based on education) 10 21   Cranial nerves: Pupils equal, round, reactive to light.  Extraocular movements intact with no nystagmus. Visual fields full. Facial sensation intact. No facial asymmetry. Tongue, uvula, palate midline.  Motor: Bulk and tone normal, muscle strength: 5/5 throughout with no pronator drift.  Sensation intact to light touch. Deep tendon reflexes +1 throughout, toes downgoing.  Finger to nose testing intact.  Gait narrow-based and steady, difficulty with tandem walk.  IMPRESSION: This is a pleasant 73 yo RH woman with a history of hypertension, hyperlipidemia, hypothyroidism, with moderate dementia, likely due to Alzheimer's disease. MOCA score today 10/30 (MMSE 21/30 in February 2020). Continue Donepezil 10mg  daily and Memantine 10mg  BID. There is continued progressive decline, support provided to husband today. She continues to report headaches, repeat MRI did not show any changes with meningioma. She will try taking the gabapentin 300mg  BID, we may uptitrate as tolerated. We again discussed the importance of control of vascular risk factors, physical exercise, and brain stimulation exercises for brain health. Follow-up in 6 months, they know to call for any changes.   Thank you for allowing me to participate in her care.  Please do not hesitate to call for any questions or concerns.  The duration of this appointment visit was 30  minutes of face-to-face time with the patient.  Greater than 50% of this time was spent in counseling, explanation of diagnosis, planning of further management, and coordination of care.   Ellouise Newer, M.D.   CC: Dr. Cheri Rous

## 2019-06-07 ENCOUNTER — Other Ambulatory Visit: Payer: Self-pay | Admitting: Family Medicine

## 2019-06-07 ENCOUNTER — Ambulatory Visit (INDEPENDENT_AMBULATORY_CARE_PROVIDER_SITE_OTHER): Payer: Medicare HMO | Admitting: Family Medicine

## 2019-06-07 ENCOUNTER — Encounter: Payer: Self-pay | Admitting: Family Medicine

## 2019-06-07 ENCOUNTER — Other Ambulatory Visit: Payer: Self-pay

## 2019-06-07 ENCOUNTER — Ambulatory Visit: Payer: Medicare HMO | Admitting: Family Medicine

## 2019-06-07 VITALS — BP 92/50 | HR 68 | Temp 97.4°F | Resp 18 | Ht 63.0 in | Wt 139.2 lb

## 2019-06-07 DIAGNOSIS — E785 Hyperlipidemia, unspecified: Secondary | ICD-10-CM

## 2019-06-07 DIAGNOSIS — E559 Vitamin D deficiency, unspecified: Secondary | ICD-10-CM | POA: Diagnosis not present

## 2019-06-07 DIAGNOSIS — Z23 Encounter for immunization: Secondary | ICD-10-CM | POA: Diagnosis not present

## 2019-06-07 DIAGNOSIS — I10 Essential (primary) hypertension: Secondary | ICD-10-CM | POA: Diagnosis not present

## 2019-06-07 DIAGNOSIS — J302 Other seasonal allergic rhinitis: Secondary | ICD-10-CM

## 2019-06-07 DIAGNOSIS — E039 Hypothyroidism, unspecified: Secondary | ICD-10-CM | POA: Diagnosis not present

## 2019-06-07 LAB — VITAMIN D 25 HYDROXY (VIT D DEFICIENCY, FRACTURES): VITD: 45.23 ng/mL (ref 30.00–100.00)

## 2019-06-07 LAB — COMPREHENSIVE METABOLIC PANEL
ALT: 17 U/L (ref 0–35)
AST: 22 U/L (ref 0–37)
Albumin: 4.3 g/dL (ref 3.5–5.2)
Alkaline Phosphatase: 65 U/L (ref 39–117)
BUN: 21 mg/dL (ref 6–23)
CO2: 30 mEq/L (ref 19–32)
Calcium: 9.4 mg/dL (ref 8.4–10.5)
Chloride: 104 mEq/L (ref 96–112)
Creatinine, Ser: 1.01 mg/dL (ref 0.40–1.20)
GFR: 53.68 mL/min — ABNORMAL LOW (ref >60.00)
Glucose, Bld: 73 mg/dL (ref 70–99)
Potassium: 4.3 mEq/L (ref 3.5–5.1)
Sodium: 142 mEq/L (ref 135–145)
Total Bilirubin: 0.4 mg/dL (ref 0.2–1.2)
Total Protein: 6.7 g/dL (ref 6.0–8.3)

## 2019-06-07 LAB — LIPID PANEL
Cholesterol: 154 mg/dL (ref 0–200)
HDL: 69.4 mg/dL (ref >39.00)
LDL Cholesterol: 69 mg/dL (ref 0–99)
NonHDL: 84.96
Total CHOL/HDL Ratio: 2
Triglycerides: 78 mg/dL (ref 0.0–149.0)
VLDL: 15.6 mg/dL (ref 0.0–40.0)

## 2019-06-07 MED ORDER — LORATADINE 10 MG PO TABS: 10.0000 mg | ORAL_TABLET | Freq: Every day | ORAL | 1 refills | Status: DC

## 2019-06-07 NOTE — Progress Notes (Signed)
Patient ID: Michelle [name], female    DOB: March 18, 1946  Age: 73 y.o. MRN: [id number]    Subjective:  Subjective  HPI Michelle [name] presents for f/u bp.  Her daughter is with her.  Her bp has been running very low--- a list of bp scanned into chart.  She is asymptomatic.     Review of Systems  Constitutional: Negative for appetite change, diaphoresis, fatigue and unexpected weight change.  Eyes: Negative for pain, redness and visual disturbance.  Respiratory: Negative for cough, chest tightness, shortness of breath and wheezing.   Cardiovascular: Negative for chest pain, palpitations and leg swelling.  Endocrine: Negative for cold intolerance, heat intolerance, polydipsia, polyphagia and polyuria.  Genitourinary: Negative for difficulty urinating, dysuria and frequency.  Neurological: Negative for dizziness, light-headedness, numbness and headaches.    History Past Medical History:  Diagnosis Date  . Adenomatous colon polyp   . Allergy   . GERD (gastroesophageal reflux disease)   . Glaucoma   . Hx of hearing loss    bilateral hearing aids  . Hyperlipidemia   . Hypertension   . IBS (irritable bowel syndrome)   . Low back pain   . Thyroid disease     She has a past surgical history that includes Tubal ligation (1977); Cholecystectomy (1998); Abdominal hysterectomy (1998); Gum surgery (1996 / 2013); and Cataract extraction, bilateral (Bilateral, 04/2015).   Her family history includes Breast cancer in her maternal grandmother; Cancer in her father; Cancer (age of onset: 28) in her brother; Emphysema in her mother; Glaucoma in her mother; Hypertension in her father and mother; Macular degeneration in her father; Other in her brother; Prostate cancer in her brother and father.She reports that she has never smoked. She has never used smokeless tobacco. She reports that she does not drink alcohol or use drugs.  Current Outpatient Medications on File Prior to Visit  Medication Sig  Dispense Refill  . ALPHAGAN P 0.1 % SOLN     . AMBULATORY NON FORMULARY MEDICATION IBgard: Takes prn    . aspirin 81 MG tablet Take 81 mg by mouth daily.     Marland Kitchen donepezil (ARICEPT) 10 MG tablet Take 1 tablet daily 90 tablet 3  . fluticasone (FLONASE) 50 MCG/ACT nasal spray Place 2 sprays into both nostrils daily. 16 g 5  . gabapentin (NEURONTIN) 300 MG capsule Take 1 cap twice a day 180 capsule 3  . levothyroxine (SYNTHROID, LEVOTHROID) 50 MCG tablet Take 1 tablet (50 mcg total) by mouth daily. 90 tablet 3  . memantine (NAMENDA) 10 MG tablet Take 1 tablet (10 mg total) by mouth 2 (two) times daily. 180 tablet 3  . Multiple Vitamin (MULTIVITAMIN) tablet Take 1 tablet by mouth daily.    . pravastatin (PRAVACHOL) 20 MG tablet TAKE 1 TABLET BY MOUTH EVERY DAY 90 tablet 3  . sertraline (ZOLOFT) 100 MG tablet Take 1 tablet (100 mg total) by mouth daily. 90 tablet 3  . benzonatate (TESSALON) 200 MG capsule Take 1 capsule (200 mg total) by mouth 3 (three) times daily as needed for cough. (Patient not taking: Reported on 06/07/2019) 90 capsule 1  . HYDROcodone-acetaminophen (NORCO/VICODIN) 5-325 MG tablet Take 1 tablet by mouth every 6 (six) hours as needed for moderate pain. (Patient not taking: Reported on 06/07/2019) 30 tablet 0   No current facility-administered medications on file prior to visit.      Objective:  Objective  Physical Exam Vitals signs and nursing note reviewed.  Constitutional:  Appearance: She is well-developed.  HENT:     Head: Normocephalic and atraumatic.  Eyes:     Conjunctiva/sclera: Conjunctivae normal.  Neck:     Musculoskeletal: Normal range of motion and neck supple.     Thyroid: No thyromegaly.     Vascular: No carotid bruit or JVD.  Cardiovascular:     Rate and Rhythm: Normal rate and regular rhythm.     Heart sounds: Normal heart sounds. No murmur.  Pulmonary:     Effort: Pulmonary effort is normal. No respiratory distress.     Breath sounds: Normal  breath sounds. No wheezing or rales.  Chest:     Chest wall: No tenderness.  Neurological:     Mental Status: She is alert and oriented to person, place, and time.    BP (!) 92/50 (BP Location: Right Arm, Patient Position: Sitting, Cuff Size: Normal)   Pulse 68   Temp (!) 97.4 F (36.3 C) (Temporal)   Resp 18   Ht 5\' 3"  (1.6 m)   Wt 139 lb 3.2 oz (63.1 kg)   SpO2 98%   BMI 24.66 kg/m  Wt Readings from Last 3 Encounters:  06/07/19 139 lb 3.2 oz (63.1 kg)  06/01/19 140 lb (63.5 kg)  10/21/18 142 lb (64.4 kg)     Lab Results  Component Value Date   WBC 6.3 05/12/2018   HGB 11.5 (L) 05/12/2018   HCT 34.4 (L) 05/12/2018   PLT 208.0 05/12/2018   GLUCOSE 95 07/21/2018   CHOL 155 07/21/2018   TRIG 63.0 07/21/2018   HDL 68.30 07/21/2018   LDLCALC 74 07/21/2018   ALT 23 07/21/2018   AST 24 07/21/2018   NA 142 07/21/2018   K 4.6 07/21/2018   CL 104 07/21/2018   CREATININE 0.74 07/21/2018   [name] 20 07/21/2018   CO2 34 (H) 07/21/2018   TSH 3.44 05/12/2018   MICROALBUR 1.0 11/03/2014    Mr Brain W Wo Contrast  Result Date: 05/06/2019 CLINICAL DATA:  Dementia. Worsening memory loss and headaches. Meningioma. Creatinine was obtained on site at [facility] at [address] Results: Creatinine 1.0 mg/dL. EXAM: MRI HEAD WITHOUT AND WITH CONTRAST TECHNIQUE: Multiplanar, multiecho pulse sequences of the brain and surrounding structures were obtained without and with intravenous contrast. CONTRAST:  56mL MULTIHANCE GADOBENATE DIMEGLUMINE 529 MG/ML IV SOLN COMPARISON:  05/15/2017 FINDINGS: Brain: There is no evidence of acute infarct, intracranial hemorrhage, midline shift, or extra-axial fluid collection. Scattered small foci of T2 hyperintensity in the cerebral white matter bilaterally are unchanged and nonspecific but compatible with minimal chronic small vessel ischemic disease. Mild cerebral atrophy has at most minimally progressed. A 1.9 by 1.0 x 2.5 cm enhancing  extra-axial mass along the dorsal aspect of the clivus to the right of midline has not significantly changed in size. Associated mass effect on the pons and midbrain is unchanged. There is no associated edema. No new enhancing intracranial lesion is identified. Vascular: Major intracranial vascular flow voids are preserved. Skull and upper cervical spine: Unremarkable bone marrow signal. Sinuses/Orbits: Bilateral cataract extraction. Subcentimeter right maxillary sinus mucous retention cyst. Trace left mastoid fluid. Other: None. IMPRESSION: 1. Unchanged dorsal clival meningioma. 2. No acute intracranial abnormality. 3. Stable to minimal progression of mild generalized cerebral atrophy. Electronically Signed   By: Logan Bores M.D.   On: 05/06/2019 16:54     Assessment & Plan:  Plan  I have discontinued Annastasia W. Josephson's valsartan. I am also having her maintain her aspirin,  multivitamin, Alphagan P, AMBULATORY NON FORMULARY MEDICATION, fluticasone, benzonatate, HYDROcodone-acetaminophen, levothyroxine, pravastatin, donepezil, memantine, gabapentin, sertraline, and loratadine.  Meds ordered this encounter  Medications  . loratadine (CLARITIN) 10 MG tablet    Sig: Take 1 tablet (10 mg total) by mouth daily.    Dispense:  90 tablet    Refill:  1    Problem List Items Addressed This Visit      Unprioritized   Essential hypertension    Running low D/c diovan con't to check bp       Hyperlipidemia    Tolerating statin, encouraged heart healthy diet, avoid trans fats, minimize simple carbs and saturated fats. Increase exercise as tolerated      Relevant Orders   Lipid panel   Comprehensive metabolic panel   Hypothyroidism    Check labs Pt is on no meds       Relevant Orders   TSH    Other Visit Diagnoses    Need for influenza vaccination    -  Primary   Relevant Orders   Flu Vaccine QUAD High Dose(Fluad)   Seasonal allergies       Relevant Medications   loratadine (CLARITIN) 10  MG tablet   Vitamin D deficiency       Relevant Orders   Vitamin D (25 hydroxy)      Follow-up: Return in about 6 months (around 12/05/2019) for annual exam, fasting.  Ann Held, DO

## 2019-06-07 NOTE — Assessment & Plan Note (Signed)
Running low D/c diovan con't to check bp

## 2019-06-07 NOTE — Assessment & Plan Note (Signed)
Tolerating statin, encouraged heart healthy diet, avoid trans fats, minimize simple carbs and saturated fats. Increase exercise as tolerated 

## 2019-06-07 NOTE — Assessment & Plan Note (Signed)
Check labs Pt is on no meds

## 2019-06-07 NOTE — Patient Instructions (Signed)
Dementia Caregiver Guide Dementia is a term used to describe a number of symptoms that affect memory and thinking. The most common symptoms include:  Memory loss.  Trouble with language and communication.  Trouble concentrating.  Poor judgment.  Problems with reasoning.  Child-like behavior and language.  Extreme anxiety.  Angry outbursts.  Wandering from home or public places. Dementia usually gets worse slowly over time. In the early stages, people with dementia can stay independent and safe with some help. In later stages, they need help with daily tasks such as dressing, grooming, and using the bathroom. How to help the person with dementia cope Dementia can be frightening and confusing. Here are some tips to help the person with dementia cope with changes caused by the disease. General tips  Keep the person on track with his or her routine.  Try to identify areas where the person may need help.  Be supportive, patient, calm, and encouraging.  Gently remind the person that adjusting to changes takes time.  Help with the tasks that the person has asked for help with.  Keep the person involved in daily tasks and decisions as much as possible.  Encourage conversation, but try not to get frustrated or harried if the person struggles to find words or does not seem to appreciate your help. Communication tips  When the person is talking or seems frustrated, make eye contact and hold the person's hand.  Ask specific questions that need yes or no answers.  Use simple words, short sentences, and a calm voice. Only give one direction at a time.  When offering choices, limit them to just 1 or 2.  Avoid correcting the person in a negative way.  If the person is struggling to find the right words, gently try to help him or her. How to recognize symptoms of stress Symptoms of stress in caregivers include:  Feeling frustrated or angry with the person with dementia.   Denying that the person has dementia or that his or her symptoms will not improve.  Feeling hopeless and unappreciated.  Difficulty sleeping.  Difficulty concentrating.  Feeling anxious, irritable, or depressed.  Developing stress-related health problems.  Feeling like you have too little time for your own life. Follow these instructions at home:   Make sure that you and the person you are caring for: ? Get regular sleep. ? Exercise regularly. ? Eat regular, nutritious meals. ? Drink enough fluid to keep your urine clear or pale yellow. ? Take over-the-counter and prescription medicines only as told by your health care providers. ? Attend all scheduled health care appointments.  Join a support group with others who are caregivers.  Ask about respite care resources so that you can have a regular break from the stress of caregiving.  Look for signs of stress in yourself and in the person you are caring for. If you notice signs of stress, take steps to manage it.  Consider any safety risks and take steps to avoid them.  Organize medications in a pill box for each day of the week.  Create a plan to handle any legal or financial matters. Get legal or financial advice if needed.  Keep a calendar in a central location to remind the person of appointments or other activities. Tips for reducing the risk of injury  Keep floors clear of clutter. Remove rugs, magazine racks, and floor lamps.  Keep hallways well lit, especially at night.  Put a handrail and nonslip mat in the bathtub or   shower.  Put childproof locks on cabinets that contain dangerous items, such as medicines, alcohol, guns, toxic cleaning items, sharp tools or utensils, matches, and lighters.  Put the locks in places where the person cannot see or reach them easily. This will help ensure that the person does not wander out of the house and get lost.  Be prepared for emergencies. Keep a list of emergency phone  numbers and addresses in a convenient area.  Remove car keys and lock garage doors so that the person does not try to get in the car and drive.  Have the person wear a bracelet that tracks locations and identifies the person as having memory problems. This should be worn at all times for safety. Where to find support: Many individuals and organizations offer support. These include:  Support groups for people with dementia and for caregivers.  Counselors or therapists.  Home health care services.  Adult day care centers. Where to find more information Alzheimer's Association: www.alz.org Contact a health care provider if:  The person's health is rapidly getting worse.  You are no longer able to care for the person.  Caring for the person is affecting your physical and emotional health.  The person threatens himself or herself, you, or anyone else. Summary  Dementia is a term used to describe a number of symptoms that affect memory and thinking.  Dementia usually gets worse slowly over time.  Take steps to reduce the person's risk of injury, and to plan for future care.  Caregivers need support, relief from caregiving, and time for their own lives. This information is not intended to replace advice given to you by your health care provider. Make sure you discuss any questions you have with your health care provider. Document Released: 07/29/2016 Document Revised: 08/07/2017 Document Reviewed: 07/29/2016 Elsevier Patient Education  2020 Elsevier Inc.  

## 2019-06-09 ENCOUNTER — Ambulatory Visit: Payer: Medicare HMO

## 2019-06-19 ENCOUNTER — Other Ambulatory Visit: Payer: Self-pay | Admitting: Family Medicine

## 2019-06-19 DIAGNOSIS — F321 Major depressive disorder, single episode, moderate: Secondary | ICD-10-CM

## 2019-07-05 ENCOUNTER — Telehealth: Payer: Self-pay | Admitting: Neurology

## 2019-07-05 NOTE — Telephone Encounter (Addendum)
BP has been low  Seen PCP, d/c BP ans cholesterol med at that time a. Also had labs at that time.   No other medication changes.  Pt wants to stay in bathroom a lot. States that it is her safe haven.  Daughter states that pt is not in any pain.

## 2019-07-05 NOTE — Telephone Encounter (Signed)
Daughter is calling in about patient- Doesn't want to eat, doesn't want to do anything, sits in the bathroom alone most of the time, blank stares, like she isn't there, she is sleeping a lot. They are looking for some answers. She has changed over time- day 11 of the big difference its like it happened over night. Gradually just gotten worse each day. She has been asleep since 10AM last night and as of 4PM today she is still in the bed. She will tell them she is going to get up but wont. Please call the daughter at [phone]. The patient's husband has been COVID positive but patient is showing no signs of it. His quarantine day was up Sunday. Thanks!

## 2019-07-06 NOTE — Telephone Encounter (Signed)
With sudden changes in symptoms, would rule out an underlying infection. When she saw PCP a month ago, it does not appear that she was having these symptoms. Pls have her call PCP to let her know about these changes, especially with husband being Covid positive. Thanks

## 2019-07-06 NOTE — Telephone Encounter (Signed)
Daughter informed. She will contact pts PCP.

## 2019-07-07 ENCOUNTER — Encounter: Payer: Self-pay | Admitting: Family Medicine

## 2019-07-07 ENCOUNTER — Ambulatory Visit (INDEPENDENT_AMBULATORY_CARE_PROVIDER_SITE_OTHER): Payer: Medicare HMO | Admitting: Family Medicine

## 2019-07-07 ENCOUNTER — Other Ambulatory Visit: Payer: Self-pay

## 2019-07-07 VITALS — BP 95/57 | HR 76 | Temp 98.1°F | Ht 63.0 in

## 2019-07-07 DIAGNOSIS — N39498 Other specified urinary incontinence: Secondary | ICD-10-CM

## 2019-07-07 DIAGNOSIS — Z20828 Contact with and (suspected) exposure to other viral communicable diseases: Secondary | ICD-10-CM | POA: Diagnosis not present

## 2019-07-07 DIAGNOSIS — F0390 Unspecified dementia without behavioral disturbance: Secondary | ICD-10-CM

## 2019-07-07 DIAGNOSIS — Z20822 Contact with and (suspected) exposure to covid-19: Secondary | ICD-10-CM

## 2019-07-07 DIAGNOSIS — R69 Illness, unspecified: Secondary | ICD-10-CM | POA: Diagnosis not present

## 2019-07-07 NOTE — Progress Notes (Signed)
Virtual Visit via Video Note  I connected with Michelle [name] on 07/07/19 at  1:40 PM EDT by a video enabled telemedicine application and verified that I am speaking with the correct person using two identifiers.  Location: Patient: home  Provider: office    I discussed the limitations of evaluation and management by telemedicine and the availability of in person appointments. The patient expressed understanding and agreed to proceed.  History of Present Illness: Pt is home with her husband and he states she is not acting herself  She has been sleeping a lot   No fever, cough  Her dementia is worsening and she has been very confused.  He is asking for a hospice referral    : Today's Vitals   07/07/19 1332  BP: (!) 95/57  Pulse: 76  Temp: 98.1 F (36.7 C)  Height: 5\' 3"  (1.6 m)   Body mass index is 24.66 kg/m. Pt is in Nad Pt is no SOB Flat affect  Answers some questions Husband is answering most of the questions Assessment and Plan: 1. Other urinary incontinence Check UA and culture  - POCT Urinalysis Dipstick (Automated); Future - Novel Coronavirus, NAA ([facility])  2. Close exposure to COVID-19 virus  - Novel Coronavirus, NAA ([facility])  3. Dementia without behavioral disturbance, unspecified dementia type ([place]) Pt husband requesting referral - Ambulatory referral to Hospice   Follow Up Instructions:    I discussed the assessment and treatment plan with the patient. The patient was provided an opportunity to ask questions and all were answered. The patient agreed with the plan and demonstrated an understanding of the instructions.   The patient was advised to call back or seek an in-person evaluation if the symptoms worsen or if the condition fails to improve as anticipated.  I provided 15 minutes of non-face-to-face time during this encounter.   Ann Held, DO

## 2019-07-08 ENCOUNTER — Other Ambulatory Visit (INDEPENDENT_AMBULATORY_CARE_PROVIDER_SITE_OTHER): Payer: Medicare HMO

## 2019-07-08 ENCOUNTER — Other Ambulatory Visit: Payer: Self-pay

## 2019-07-08 DIAGNOSIS — N39498 Other specified urinary incontinence: Secondary | ICD-10-CM

## 2019-07-08 LAB — POC URINALSYSI DIPSTICK (AUTOMATED)
Blood, UA: NEGATIVE
Glucose, UA: NEGATIVE
Ketones, UA: 5
Nitrite, UA: NEGATIVE
Protein, UA: POSITIVE — AB
Spec Grav, UA: 1.015 (ref 1.010–1.025)
Urobilinogen, UA: 0.2 E.U./dL
pH, UA: 6 (ref 5.0–8.0)

## 2019-07-12 ENCOUNTER — Telehealth: Payer: Self-pay

## 2019-07-12 NOTE — Telephone Encounter (Signed)
Copied from [place] [id number]. Topic: Quick Communication - Lab Results (Clinic Use ONLY) >> Jul 12, 2019 10:06 AM Lennox Solders wrote: Pt husband dropped off urine sample this past Friday and would like results

## 2019-07-13 ENCOUNTER — Other Ambulatory Visit: Payer: Medicare HMO

## 2019-07-13 ENCOUNTER — Other Ambulatory Visit: Payer: Self-pay

## 2019-07-13 DIAGNOSIS — N39498 Other specified urinary incontinence: Secondary | ICD-10-CM | POA: Diagnosis not present

## 2019-07-13 NOTE — Telephone Encounter (Signed)
Spoke with daughter, Abigail Butts. She will come by to pick up specimen cup for culture. Culture order has been placed.

## 2019-07-14 ENCOUNTER — Other Ambulatory Visit: Payer: Self-pay

## 2019-07-14 DIAGNOSIS — Z20822 Contact with and (suspected) exposure to covid-19: Secondary | ICD-10-CM

## 2019-07-15 ENCOUNTER — Other Ambulatory Visit: Payer: Self-pay

## 2019-07-15 ENCOUNTER — Other Ambulatory Visit: Payer: Self-pay | Admitting: Family Medicine

## 2019-07-15 ENCOUNTER — Telehealth: Payer: Self-pay

## 2019-07-15 DIAGNOSIS — N39498 Other specified urinary incontinence: Secondary | ICD-10-CM

## 2019-07-15 DIAGNOSIS — N390 Urinary tract infection, site not specified: Secondary | ICD-10-CM

## 2019-07-15 LAB — URINE CULTURE
MICRO NUMBER:: 1064084
SPECIMEN QUALITY:: ADEQUATE

## 2019-07-15 MED ORDER — CEFDINIR 300 MG PO CAPS: 300.0000 mg | ORAL_CAPSULE | Freq: Two times a day (BID) | ORAL | 0 refills | Status: DC

## 2019-07-15 NOTE — Telephone Encounter (Signed)
Please advise 

## 2019-07-15 NOTE — Telephone Encounter (Signed)
I will send abx to pharmacy since it is Friday  But I would like to get another culture

## 2019-07-15 NOTE — Telephone Encounter (Signed)
Spoke with pt's daughter, Abigail Butts. Abigail Butts states patient is sleeping a lot and will not drink much.  Advised daughter that we needed a new culture but Dr. Etter Sjogren sent in a abx for the weekend. I have placed two cups and 6 wipes for patient use at the front. Advised daughter that if patient stops producing urine than she needs to be seen at a urgent care or ED for fluids. I advised pt's daughter to get sample Sunday evening and store in the refrigerator till Monday.

## 2019-07-15 NOTE — Telephone Encounter (Signed)
Michelle [name] with [facility]) calling to reports she has done the initial visit. VS stable - Temp. 98.2 BP 96/50 pulse 66  Resp. 16  O2 sat 98%. Up and dressed. Family reports pt. Is more sleepy, confused. Decreased oral intake. COVID 19 test pending. Urine culture was contaminated. Does Dr. Carollee Herter want another culture collected. Please advise.

## 2019-07-15 NOTE — Telephone Encounter (Signed)
If they are able to get a clean catch that would be great

## 2019-07-16 LAB — NOVEL CORONAVIRUS, NAA: SARS-CoV-2, NAA: DETECTED — AB

## 2019-07-18 ENCOUNTER — Other Ambulatory Visit (INDEPENDENT_AMBULATORY_CARE_PROVIDER_SITE_OTHER): Payer: Medicare HMO

## 2019-07-18 ENCOUNTER — Other Ambulatory Visit: Payer: Self-pay

## 2019-07-18 DIAGNOSIS — N39498 Other specified urinary incontinence: Secondary | ICD-10-CM | POA: Diagnosis not present

## 2019-07-20 LAB — URINE CULTURE
MICRO NUMBER:: 1078646
Result:: NO GROWTH
SPECIMEN QUALITY:: ADEQUATE

## 2019-08-08 ENCOUNTER — Other Ambulatory Visit: Payer: Self-pay

## 2019-08-08 ENCOUNTER — Other Ambulatory Visit: Payer: Self-pay | Admitting: Family Medicine

## 2019-08-08 ENCOUNTER — Other Ambulatory Visit (INDEPENDENT_AMBULATORY_CARE_PROVIDER_SITE_OTHER): Payer: Medicare HMO

## 2019-08-08 DIAGNOSIS — R739 Hyperglycemia, unspecified: Secondary | ICD-10-CM

## 2019-08-08 DIAGNOSIS — E785 Hyperlipidemia, unspecified: Secondary | ICD-10-CM

## 2019-08-08 DIAGNOSIS — E039 Hypothyroidism, unspecified: Secondary | ICD-10-CM

## 2019-08-08 LAB — COMPREHENSIVE METABOLIC PANEL
ALT: 13 U/L (ref 0–35)
AST: 20 U/L (ref 0–37)
Albumin: 3.6 g/dL (ref 3.5–5.2)
Alkaline Phosphatase: 58 U/L (ref 39–117)
BUN: 15 mg/dL (ref 6–23)
CO2: 31 mEq/L (ref 19–32)
Calcium: 9.1 mg/dL (ref 8.4–10.5)
Chloride: 105 mEq/L (ref 96–112)
Creatinine, Ser: 0.84 mg/dL (ref 0.40–1.20)
GFR: 66.38 mL/min (ref >60.00)
Glucose, Bld: 120 mg/dL — ABNORMAL HIGH (ref 70–99)
Potassium: 4 mEq/L (ref 3.5–5.1)
Sodium: 142 mEq/L (ref 135–145)
Total Bilirubin: 0.5 mg/dL (ref 0.2–1.2)
Total Protein: 6.4 g/dL (ref 6.0–8.3)

## 2019-08-08 LAB — LIPID PANEL
Cholesterol: 171 mg/dL (ref 0–200)
HDL: 57.9 mg/dL (ref >39.00)
LDL Cholesterol: 92 mg/dL (ref 0–99)
NonHDL: 113.3
Total CHOL/HDL Ratio: 3
Triglycerides: 109 mg/dL (ref 0.0–149.0)
VLDL: 21.8 mg/dL (ref 0.0–40.0)

## 2019-08-24 ENCOUNTER — Telehealth: Payer: Self-pay

## 2019-08-24 ENCOUNTER — Encounter: Payer: Self-pay | Admitting: Family Medicine

## 2019-08-24 ENCOUNTER — Other Ambulatory Visit: Payer: Self-pay

## 2019-08-24 ENCOUNTER — Ambulatory Visit (INDEPENDENT_AMBULATORY_CARE_PROVIDER_SITE_OTHER): Payer: Medicare HMO | Admitting: Family Medicine

## 2019-08-24 VITALS — BP 109/59 | HR 67 | Ht 63.0 in

## 2019-08-24 DIAGNOSIS — J069 Acute upper respiratory infection, unspecified: Secondary | ICD-10-CM | POA: Diagnosis not present

## 2019-08-24 NOTE — Telephone Encounter (Signed)
Pt needs a virtual with Lowne please

## 2019-08-24 NOTE — Progress Notes (Signed)
Virtual Visit via Video Note  I connected with Michelle [name] on 08/24/19 at 11:00 AM EST by a video enabled telemedicine application and verified that I am speaking with the correct person using two identifiers.  Location: Patient: home with husband  Provider: home    I discussed the limitations of evaluation and management by telemedicine and the availability of in person appointments. The patient expressed understanding and agreed to proceed.  History of Present Illness: Pt is home with her husband and c/o runny nose -- no fever, chills etc She has tried coricidin hbp with little relief    Observations/Objective: 139/67  p 70    Afebrile  Pt is in NAD  Assessment and Plan: .1. Viral upper respiratory tract infection  con't coricidin hbp Use the flonase and astelin you have at home  Call with and worsening symptoms    Follow Up Instructions:    I discussed the assessment and treatment plan with the patient. The patient was provided an opportunity to ask questions and all were answered. The patient agreed with the plan and demonstrated an understanding of the instructions.   The patient was advised to call back or seek an in-person evaluation if the symptoms worsen or if the condition fails to improve as anticipated.  I provided 15 minutes of non-face-to-face time during this encounter.   Ann Held, DO

## 2019-08-24 NOTE — Telephone Encounter (Signed)
Copied from [place] [phone]. Topic: General - Other >> Aug 23, 2019  2:03 PM Oneta Rack wrote: Mrs. Governor Specking RN from [facility] # [phone]    Mild head ache, runny nose patient has been taking coricidin hbp flu and allergy medicine 2x daily and symtopms have not improved. Nurse would like a follow up call regarding a alternate Rx, please advise

## 2019-08-24 NOTE — Assessment & Plan Note (Signed)
con't coricidin hbp Use the flonase and astelin you have at home  Call with and worsening symptoms

## 2019-08-24 NOTE — Telephone Encounter (Signed)
appt scheduled for today 11am virtual

## 2019-09-20 DIAGNOSIS — M7541 Impingement syndrome of right shoulder: Secondary | ICD-10-CM | POA: Insufficient documentation

## 2019-09-20 DIAGNOSIS — M25511 Pain in right shoulder: Secondary | ICD-10-CM | POA: Insufficient documentation

## 2019-09-30 ENCOUNTER — Other Ambulatory Visit: Payer: Self-pay | Admitting: Family Medicine

## 2019-09-30 DIAGNOSIS — E039 Hypothyroidism, unspecified: Secondary | ICD-10-CM

## 2019-10-10 ENCOUNTER — Telehealth: Payer: Self-pay

## 2019-10-10 NOTE — Telephone Encounter (Signed)
Patient nurse from

## 2019-10-11 ENCOUNTER — Telehealth: Payer: Self-pay | Admitting: Neurology

## 2019-10-11 ENCOUNTER — Ambulatory Visit (INDEPENDENT_AMBULATORY_CARE_PROVIDER_SITE_OTHER): Payer: Medicare HMO | Admitting: Family Medicine

## 2019-10-11 ENCOUNTER — Encounter: Payer: Self-pay | Admitting: Family Medicine

## 2019-10-11 ENCOUNTER — Other Ambulatory Visit: Payer: Self-pay

## 2019-10-11 VITALS — BP 80/49 | HR 71

## 2019-10-11 DIAGNOSIS — I950 Idiopathic hypotension: Secondary | ICD-10-CM

## 2019-10-11 DIAGNOSIS — I10 Essential (primary) hypertension: Secondary | ICD-10-CM | POA: Diagnosis not present

## 2019-10-11 DIAGNOSIS — R55 Syncope and collapse: Secondary | ICD-10-CM

## 2019-10-11 DIAGNOSIS — N390 Urinary tract infection, site not specified: Secondary | ICD-10-CM | POA: Diagnosis not present

## 2019-10-11 DIAGNOSIS — E039 Hypothyroidism, unspecified: Secondary | ICD-10-CM | POA: Diagnosis not present

## 2019-10-11 NOTE — Telephone Encounter (Signed)
called pt husband back got voice mail

## 2019-10-11 NOTE — Telephone Encounter (Signed)
The following message was left with [facility] on 10/11/19 at 12:44 PM.  Patient's spouse called to report they had an issue on Sunday with fainting and low blood pressure. A note is going to be sent over to the office about this from her main doctor.  One of the blood pressure reading was 73/47. Dr. Lawson Radar is her other doctor

## 2019-10-11 NOTE — Telephone Encounter (Signed)
Spoke with pt husband he was worried about pt medications causing pt bp to drop, Dr. Delice Lesch had already talked to PT PCP about medications pt husband was informed Dr Delice Lesch was aware of BP and the medications she prescribed didn't cause low bp, pcp didn't mention to dr Delice Lesch about starting something to increase BP she did inform her that lab work was done,  Pt husband was wanting to take his wife to the ER but worried that if he did he would not be able to be with her, we got off the phone I called the ER spoke with Santiago Glad ER nurse she advised that with PT having Dementia that her husband POA would be able to be with her while in the ER. After getting off the phone with Santiago Glad I called Mr, Floria back and told him I talk to the ER and they told me that he would be able to be with her because of her dementia, he stated that he would call and have 911 take her to the ER so that she could be checked out and thanked me for calling for him, he asked when wife's follow up visit was he was informed it  Is 12/05/19 at 11:30am

## 2019-10-12 DIAGNOSIS — R55 Syncope and collapse: Secondary | ICD-10-CM | POA: Insufficient documentation

## 2019-10-12 DIAGNOSIS — I950 Idiopathic hypotension: Secondary | ICD-10-CM | POA: Insufficient documentation

## 2019-10-12 NOTE — Progress Notes (Signed)
Virtual Visit via Telephone Note  I connected with Michelle [name] on 10/11/2019 at 11:40 AM EST by telephone and verified that I am speaking with the correct person using two identifiers.  Location: Patient: home with husband and [facility] nurse  Provider: office   I discussed the limitations, risks, security and privacy concerns of performing an evaluation and management service by telephone and the availability of in person appointments. I also discussed with the patient that there may be a patient responsible charge related to this service. The patient expressed understanding and agreed to proceed.   History of Present Illness: Pt bp has been running low ans she has been dizzy.  She passed out for a few seconds but her husband did not call 911.  He called the nurse with [facility] who came over and checked bp.  They re asking for medication to inc her bp.  She is not taking any bp meds.   No other new problems.     Observations/Objective: Vitals:   10/11/19 1149 10/11/19 1150  BP: (!) 69/43 (!) 80/49  Pulse: 75 71   Husband is giving hx Pt is reluctant to take her to ER    Assessment and Plan: .1. Idiopathic hypotension Would like to check labs  Still recommend she go  To Er but husband is reluctant  Home health nurse will draw labs and try to get UA Husband will take her to ER if symptoms worsen    2. Syncope unspecified syncope type Did recommend they let neuro know about incident  Hesitant to give meds to inc bp without further work up    Follow Up Instructions:    I discussed the assessment and treatment plan with the patient. The patient was provided an opportunity to ask questions and all were answered. The patient agreed with the plan and demonstrated an understanding of the instructions.   The patient was advised to call back or seek an in-person evaluation if the symptoms worsen or if the condition fails to improve as anticipated.  I provided 25 minutes of non-face-to-face  time during this encounter.   Ann Held, DO

## 2019-10-14 ENCOUNTER — Other Ambulatory Visit: Payer: Self-pay | Admitting: Family Medicine

## 2019-10-14 MED ORDER — CEPHALEXIN 500 MG PO CAPS: 500.0000 mg | ORAL_CAPSULE | Freq: Two times a day (BID) | ORAL | 0 refills | Status: DC

## 2019-10-30 ENCOUNTER — Ambulatory Visit: Payer: Medicare HMO | Attending: Internal Medicine

## 2019-10-30 DIAGNOSIS — Z23 Encounter for immunization: Secondary | ICD-10-CM | POA: Insufficient documentation

## 2019-10-30 NOTE — Progress Notes (Signed)
   Covid-19 Vaccination Clinic  Name:  TOLEEN SPRADLIN    MRN: [id number] DOB: 02/23/1946  10/30/2019  Ms. Stroh was observed post Covid-19 immunization for 15 minutes without incidence. She was provided with Vaccine Information Sheet and instruction to access the V-Safe system.   Ms. Confair was instructed to call 911 with any severe reactions post vaccine: Marland Kitchen Difficulty breathing  . Swelling of your face and throat  . A fast heartbeat  . A bad rash all over your body  . Dizziness and weakness    Immunizations Administered    Name Date Dose VIS Date Route   Pfizer COVID-19 Vaccine 10/30/2019 11:55 AM 0.3 mL 08/19/2019 Intramuscular   Manufacturer: [place]   Lot: [id number]   [place]: [id number]

## 2019-11-01 ENCOUNTER — Telehealth: Payer: Self-pay | Admitting: Family Medicine

## 2019-11-01 NOTE — Telephone Encounter (Signed)
Pt's spouse dropped off document for provider to have on pt's chart. ([employer] [facility] Screening - 4 pages) Document put at front office tray under providers name.

## 2019-11-02 NOTE — Telephone Encounter (Signed)
Documents recieved 

## 2019-11-03 ENCOUNTER — Telehealth: Payer: Self-pay | Admitting: Family Medicine

## 2019-11-03 NOTE — Telephone Encounter (Signed)
Patient is continues have hypotension, wanted to find what testing Dr.Lowne is recommending for patient ? Marland Kitchen

## 2019-11-03 NOTE — Telephone Encounter (Signed)
I answered the rn ---- she really needs ov  We have done all the labs  Prob need repeat ua

## 2019-11-03 NOTE — Telephone Encounter (Signed)
Pt last seen on 10/11/19 for Hypotension. Please advice

## 2019-11-04 NOTE — Telephone Encounter (Signed)
Spoke with [name] from [employer]. She states she went out to see the patient on 11/02/19. She states pt's BPs were sitting 100/50 and standing was 76/48. She states patient didn't seem weak or have a syncope episode. [name] wants me to call pt's husband to see if he was willing to bring pt in for a OV.   Spoke with husband. Pt has appointment on 11/10/19 at 140

## 2019-11-04 NOTE — Telephone Encounter (Signed)
Attempted to call [name] back but got VM

## 2019-11-07 NOTE — Telephone Encounter (Signed)
great

## 2019-11-10 ENCOUNTER — Ambulatory Visit (INDEPENDENT_AMBULATORY_CARE_PROVIDER_SITE_OTHER): Payer: Medicare HMO | Admitting: Family Medicine

## 2019-11-10 ENCOUNTER — Other Ambulatory Visit: Payer: Self-pay

## 2019-11-10 ENCOUNTER — Encounter: Payer: Self-pay | Admitting: Family Medicine

## 2019-11-10 VITALS — BP 110/58 | HR 76 | Temp 97.7°F | Resp 18 | Ht 63.0 in | Wt 134.0 lb

## 2019-11-10 DIAGNOSIS — G301 Alzheimer's disease with late onset: Secondary | ICD-10-CM | POA: Insufficient documentation

## 2019-11-10 DIAGNOSIS — E039 Hypothyroidism, unspecified: Secondary | ICD-10-CM

## 2019-11-10 DIAGNOSIS — G300 Alzheimer's disease with early onset: Secondary | ICD-10-CM | POA: Diagnosis not present

## 2019-11-10 DIAGNOSIS — R69 Illness, unspecified: Secondary | ICD-10-CM | POA: Diagnosis not present

## 2019-11-10 DIAGNOSIS — F0280 Dementia in other diseases classified elsewhere without behavioral disturbance: Secondary | ICD-10-CM

## 2019-11-10 DIAGNOSIS — E785 Hyperlipidemia, unspecified: Secondary | ICD-10-CM

## 2019-11-10 DIAGNOSIS — I950 Idiopathic hypotension: Secondary | ICD-10-CM

## 2019-11-10 DIAGNOSIS — F0281 Dementia in other diseases classified elsewhere with behavioral disturbance: Secondary | ICD-10-CM | POA: Insufficient documentation

## 2019-11-10 DIAGNOSIS — N390 Urinary tract infection, site not specified: Secondary | ICD-10-CM

## 2019-11-10 DIAGNOSIS — F02818 Dementia in other diseases classified elsewhere, unspecified severity, with other behavioral disturbance: Secondary | ICD-10-CM | POA: Insufficient documentation

## 2019-11-10 LAB — TSH: TSH: 2.38 u[IU]/mL (ref 0.35–4.50)

## 2019-11-10 NOTE — Assessment & Plan Note (Signed)
Check labs 

## 2019-11-10 NOTE — Assessment & Plan Note (Signed)
con't with inc fluid intake Husband wants to hold off on meds for now since her bp seems to be going up

## 2019-11-10 NOTE — Progress Notes (Signed)
Patient ID: Michelle Michelle [name], female    DOB: November 29, 1945  Age: 73 y.o. MRN: [id number]    Subjective:  Subjective  HPI Michelle Michelle [name] presents for f/u uti and low bp but her bp have been better.  She is drinking propel and less soda  They have brought in home bp readings--- scanned into epic  Her husband is present today and providing history. Review of Systems  Constitutional: Negative for appetite change, diaphoresis, fatigue and unexpected weight change.  Eyes: Negative for pain, redness and visual disturbance.  Respiratory: Negative for cough, chest tightness, shortness of breath and wheezing.   Cardiovascular: Negative for chest pain, palpitations and leg swelling.  Endocrine: Negative for cold intolerance, heat intolerance, polydipsia, polyphagia and polyuria.  Genitourinary: Negative for difficulty urinating, dysuria and frequency.  Neurological: Negative for dizziness, light-headedness, numbness and headaches.  Psychiatric/Behavioral: Positive for confusion and decreased concentration. Negative for agitation and behavioral problems.    History Past Medical History:  Diagnosis Date  . Adenomatous colon polyp   . Allergy   . GERD (gastroesophageal reflux disease)   . Glaucoma   . Hx of hearing loss    bilateral hearing aids  . Hyperlipidemia   . Hypertension   . IBS (irritable bowel syndrome)   . Low back pain   . Thyroid disease     She has a past surgical history that includes Tubal ligation (1977); Cholecystectomy (1998); Abdominal hysterectomy (1998); Gum surgery (1996 / 2013); and Cataract extraction, bilateral (Bilateral, 04/2015).   Her family history includes Breast cancer in her maternal grandmother; Cancer in her father; Cancer (age of onset: 67) in her brother; Emphysema in her mother; Glaucoma in her mother; Hypertension in her father and mother; Macular degeneration in her father; Other in her brother; Prostate cancer in her brother and father.She reports that  she has never smoked. She has never used smokeless tobacco. She reports that she does not drink alcohol or use drugs.  Current Outpatient Medications on File Prior to Visit  Medication Sig Dispense Refill  . ALPHAGAN P 0.1 % SOLN     . AMBULATORY NON FORMULARY MEDICATION IBgard: Takes prn    . aspirin 81 MG tablet Take 81 mg by mouth daily.     Marland Kitchen donepezil (ARICEPT) 10 MG tablet Take 1 tablet daily 90 tablet 3  . gabapentin (NEURONTIN) 300 MG capsule Take 1 cap twice a day 180 capsule 3  . HYDROcodone-acetaminophen (NORCO/VICODIN) 5-325 MG tablet Take 1 tablet by mouth every 6 (six) hours as needed for moderate pain. 30 tablet 0  . levothyroxine (SYNTHROID) 50 MCG tablet TAKE 1 TABLET BY MOUTH EVERY DAY 90 tablet 3  . loratadine (CLARITIN) 10 MG tablet Take 1 tablet (10 mg total) by mouth daily. 90 tablet 1  . memantine (NAMENDA) 10 MG tablet Take 1 tablet (10 mg total) by mouth 2 (two) times daily. 180 tablet 3  . Multiple Vitamin (MULTIVITAMIN) tablet Take 1 tablet by mouth daily.    . pravastatin (PRAVACHOL) 20 MG tablet TAKE 1 TABLET BY MOUTH EVERY DAY 90 tablet 3  . sertraline (ZOLOFT) 100 MG tablet Take 1 tablet (100 mg total) by mouth daily. 90 tablet 3  . cephALEXin (KEFLEX) 500 MG capsule Take 1 capsule (500 mg total) by mouth 2 (two) times daily. (Patient not taking: Reported on 11/10/2019) 20 capsule 0  . fluticasone (FLONASE) 50 MCG/ACT nasal spray Place 2 sprays into both nostrils daily. (Patient not taking: Reported on 08/24/2019) 16 g  5   No current facility-administered medications on file prior to visit.     Objective:  Objective  Physical Exam Vitals and nursing note reviewed.  Constitutional:      Appearance: She is well-developed.  HENT:     Head: Normocephalic and atraumatic.  Eyes:     Conjunctiva/sclera: Conjunctivae normal.  Neck:     Thyroid: No thyromegaly.     Vascular: No carotid bruit or JVD.  Cardiovascular:     Rate and Rhythm: Normal rate and regular  rhythm.     Heart sounds: Normal heart sounds. No murmur.  Pulmonary:     Effort: Pulmonary effort is normal. No respiratory distress.     Breath sounds: Normal breath sounds. No wheezing or rales.  Chest:     Chest wall: No tenderness.  Musculoskeletal:     Cervical back: Normal range of motion and neck supple.  Neurological:     Mental Status: She is alert. She is disoriented.     Comments: Dementia is worsening  Per husband     BP (!) 110/58 (BP Location: Right Arm, Patient Position: Sitting, Cuff Size: Normal)   Pulse 76   Temp 97.7 F (36.5 C) (Temporal)   Resp 18   Ht 5\' 3"  (1.6 m)   Wt 134 lb (60.8 kg)   SpO2 96%   BMI 23.74 kg/m  Wt Readings from Last 3 Encounters:  11/10/19 134 lb (60.8 kg)  06/07/19 139 lb 3.2 oz (63.1 kg)  06/01/19 140 lb (63.5 kg)     Lab Results  Component Value Date   WBC 6.3 05/12/2018   HGB 11.5 (L) 05/12/2018   HCT 34.4 (L) 05/12/2018   PLT 208.0 05/12/2018   GLUCOSE 120 (H) 08/08/2019   CHOL 171 08/08/2019   TRIG 109.0 08/08/2019   HDL 57.90 08/08/2019   LDLCALC 92 08/08/2019   ALT 13 08/08/2019   AST 20 08/08/2019   NA 142 08/08/2019   K 4.0 08/08/2019   CL 105 08/08/2019   CREATININE 0.84 08/08/2019   Michelle [name] 15 08/08/2019   CO2 31 08/08/2019   TSH 2.38 11/10/2019   MICROALBUR 1.0 11/03/2014    MR BRAIN W WO CONTRAST  Result Date: 05/06/2019 CLINICAL DATA:  Dementia. Worsening memory loss and headaches. Meningioma. Creatinine was obtained on site at [facility] at [address] Results: Creatinine 1.0 mg/dL. EXAM: MRI HEAD WITHOUT AND WITH CONTRAST TECHNIQUE: Multiplanar, multiecho pulse sequences of the brain and surrounding structures were obtained without and with intravenous contrast. CONTRAST:  23mL MULTIHANCE GADOBENATE DIMEGLUMINE 529 MG/ML IV SOLN COMPARISON:  05/15/2017 FINDINGS: Brain: There is no evidence of acute infarct, intracranial hemorrhage, midline shift, or extra-axial fluid collection.  Scattered small foci of T2 hyperintensity in the cerebral white matter bilaterally are unchanged and nonspecific but compatible with minimal chronic small vessel ischemic disease. Mild cerebral atrophy has at most minimally progressed. A 1.9 by 1.0 x 2.5 cm enhancing extra-axial mass along the dorsal aspect of the clivus to the right of midline has not significantly changed in size. Associated mass effect on the pons and midbrain is unchanged. There is no associated edema. No new enhancing intracranial lesion is identified. Vascular: Major intracranial vascular flow voids are preserved. Skull and upper cervical spine: Unremarkable bone marrow signal. Sinuses/Orbits: Bilateral cataract extraction. Subcentimeter right maxillary sinus mucous retention cyst. Trace left mastoid fluid. Other: None. IMPRESSION: 1. Unchanged dorsal clival meningioma. 2. No acute intracranial abnormality. 3. Stable to minimal progression of mild  generalized cerebral atrophy. Electronically Signed   By: Logan Bores M.D.   On: 05/06/2019 16:54     Assessment & Plan:  Plan  I am having Michelle Michelle [name] maintain her aspirin, multivitamin, Alphagan P, AMBULATORY NON FORMULARY MEDICATION, fluticasone, HYDROcodone-acetaminophen, pravastatin, donepezil, memantine, gabapentin, sertraline, loratadine, levothyroxine, and cephALEXin.  No orders of the defined types were placed in this encounter.   Problem List Items Addressed This Visit      Unprioritized   Early onset Alzheimer's dementia without behavioral disturbance ([place])    con't meds and f/u neuro       Hyperlipidemia    Encouraged heart healthy diet, increase exercise, avoid trans fats, consider a krill oil cap daily      Hypothyroidism    Check labs       Relevant Orders   TSH (Completed)   Idiopathic hypotension    con't with inc fluid intake Husband wants to hold off on meds for now since her bp seems to be going up        Other Visit Diagnoses    Urinary  tract infection without hematuria, site unspecified    -  Primary   Relevant Orders   POCT urinalysis dipstick   Urine Culture      Follow-up: Return in about 6 months (around 05/12/2020), or if symptoms worsen or fail to improve, for hypertension, hyperlipidemia, annual exam, fasting.  Ann Held, DO

## 2019-11-10 NOTE — Patient Instructions (Signed)
Urinary Tract Infection, Adult A urinary tract infection (UTI) is an infection of any part of the urinary tract. The urinary tract includes:  The kidneys.  The ureters.  The bladder.  The urethra. These organs make, store, and get rid of pee (urine) in the body. What are the causes? This is caused by germs (bacteria) in your genital area. These germs grow and cause swelling (inflammation) of your urinary tract. What increases the risk? You are more likely to develop this condition if:  You have a small, thin tube (catheter) to drain pee.  You cannot control when you pee or poop (incontinence).  You are female, and: ? You use these methods to prevent pregnancy:  A medicine that kills sperm (spermicide).  A device that blocks sperm (diaphragm). ? You have low levels of a female hormone (estrogen). ? You are pregnant.  You have genes that add to your risk.  You are sexually active.  You take antibiotic medicines.  You have trouble peeing because of: ? A prostate that is bigger than normal, if you are female. ? A blockage in the part of your body that drains pee from the bladder (urethra). ? A kidney stone. ? A nerve condition that affects your bladder (neurogenic bladder). ? Not getting enough to drink. ? Not peeing often enough.  You have other conditions, such as: ? Diabetes. ? A weak disease-fighting system (immune system). ? Sickle cell disease. ? Gout. ? Injury of the spine. What are the signs or symptoms? Symptoms of this condition include:  Needing to pee right away (urgently).  Peeing often.  Peeing small amounts often.  Pain or burning when peeing.  Blood in the pee.  Pee that smells bad or not like normal.  Trouble peeing.  Pee that is cloudy.  Fluid coming from the vagina, if you are female.  Pain in the belly or lower back. Other symptoms include:  Throwing up (vomiting).  No urge to eat.  Feeling mixed up (confused).  Being tired  and grouchy (irritable).  A fever.  Watery poop (diarrhea). How is this treated? This condition may be treated with:  Antibiotic medicine.  Other medicines.  Drinking enough water. Follow these instructions at home:  Medicines  Take over-the-counter and prescription medicines only as told by your doctor.  If you were prescribed an antibiotic medicine, take it as told by your doctor. Do not stop taking it even if you start to feel better. General instructions  Make sure you: ? Pee until your bladder is empty. ? Do not hold pee for a long time. ? Empty your bladder after sex. ? Wipe from front to back after pooping if you are a female. Use each tissue one time when you wipe.  Drink enough fluid to keep your pee pale yellow.  Keep all follow-up visits as told by your doctor. This is important. Contact a doctor if:  You do not get better after 1-2 days.  Your symptoms go away and then come back. Get help right away if:  You have very bad back pain.  You have very bad pain in your lower belly.  You have a fever.  You are sick to your stomach (nauseous).  You are throwing up. Summary  A urinary tract infection (UTI) is an infection of any part of the urinary tract.  This condition is caused by germs in your genital area.  There are many risk factors for a UTI. These include having a small, thin   tube to drain pee and not being able to control when you pee or poop.  Treatment includes antibiotic medicines for germs.  Drink enough fluid to keep your pee pale yellow. This information is not intended to replace advice given to you by your health care provider. Make sure you discuss any questions you have with your health care provider. Document Revised: 08/12/2018 Document Reviewed: 03/04/2018 Elsevier Patient Education  2020 [employer] Inc.  

## 2019-11-10 NOTE — Assessment & Plan Note (Signed)
Encouraged heart healthy diet, increase exercise, avoid trans fats, consider a krill oil cap daily 

## 2019-11-10 NOTE — Assessment & Plan Note (Signed)
con't meds and f/u neuro

## 2019-11-11 ENCOUNTER — Other Ambulatory Visit (INDEPENDENT_AMBULATORY_CARE_PROVIDER_SITE_OTHER): Payer: Medicare HMO

## 2019-11-11 DIAGNOSIS — N390 Urinary tract infection, site not specified: Secondary | ICD-10-CM

## 2019-11-11 DIAGNOSIS — R739 Hyperglycemia, unspecified: Secondary | ICD-10-CM | POA: Diagnosis not present

## 2019-11-11 DIAGNOSIS — E785 Hyperlipidemia, unspecified: Secondary | ICD-10-CM

## 2019-11-11 LAB — LIPID PANEL
Cholesterol: 158 mg/dL (ref 0–200)
HDL: 60.1 mg/dL (ref >39.00)
LDL Cholesterol: 75 mg/dL (ref 0–99)
NonHDL: 98.18
Total CHOL/HDL Ratio: 3
Triglycerides: 115 mg/dL (ref 0.0–149.0)
VLDL: 23 mg/dL (ref 0.0–40.0)

## 2019-11-11 LAB — COMPREHENSIVE METABOLIC PANEL
ALT: 14 U/L (ref 0–35)
AST: 20 U/L (ref 0–37)
Albumin: 3.9 g/dL (ref 3.5–5.2)
Alkaline Phosphatase: 59 U/L (ref 39–117)
BUN: 22 mg/dL (ref 6–23)
CO2: 30 mEq/L (ref 19–32)
Calcium: 9.2 mg/dL (ref 8.4–10.5)
Chloride: 107 mEq/L (ref 96–112)
Creatinine, Ser: 1.08 mg/dL (ref 0.40–1.20)
GFR: 49.63 mL/min — ABNORMAL LOW (ref >60.00)
Glucose, Bld: 88 mg/dL (ref 70–99)
Potassium: 4.4 mEq/L (ref 3.5–5.1)
Sodium: 144 mEq/L (ref 135–145)
Total Bilirubin: 0.2 mg/dL (ref 0.2–1.2)
Total Protein: 6.3 g/dL (ref 6.0–8.3)

## 2019-11-11 LAB — POCT URINALYSIS DIP (MANUAL ENTRY)
Bilirubin, UA: NEGATIVE
Blood, UA: NEGATIVE
Glucose, UA: NEGATIVE mg/dL
Nitrite, UA: NEGATIVE
Spec Grav, UA: 1.02 (ref 1.010–1.025)
Urobilinogen, UA: 0.2 E.U./dL
pH, UA: 5.5 (ref 5.0–8.0)

## 2019-11-11 NOTE — Addendum Note (Signed)
Addended by: Caffie Pinto on: 11/11/2019 08:55 AM   Modules accepted: Orders

## 2019-11-14 LAB — URINE CULTURE
MICRO NUMBER:: 10219627
SPECIMEN QUALITY:: ADEQUATE

## 2019-11-15 MED ORDER — NITROFURANTOIN MONOHYD MACRO 100 MG PO CAPS: 100.0000 mg | ORAL_CAPSULE | Freq: Two times a day (BID) | ORAL | 0 refills | Status: DC

## 2019-11-15 NOTE — Addendum Note (Signed)
Addended by: Wynonia Musty A on: 11/15/2019 08:47 AM   Modules accepted: Orders

## 2019-11-23 ENCOUNTER — Ambulatory Visit: Payer: Medicare HMO | Attending: Internal Medicine

## 2019-11-23 DIAGNOSIS — Z23 Encounter for immunization: Secondary | ICD-10-CM

## 2019-11-23 NOTE — Progress Notes (Signed)
   Covid-19 Vaccination Clinic  Name:  Michelle [name]    MRN: [id number] DOB: 02/28/1946  11/23/2019  [name] was observed post Covid-19 immunization for 15 minutes without incident. She was provided with Vaccine Information Sheet and instruction to access the V-Safe system.   [name] was instructed to call 911 with any severe reactions post vaccine: Marland Kitchen Difficulty breathing  . Swelling of face and throat  . A fast heartbeat  . A bad rash all over body  . Dizziness and weakness   Immunizations Administered    Name Date Dose VIS Date Route   Pfizer COVID-19 Vaccine 11/23/2019 10:46 AM 0.3 mL 08/19/2019 Intramuscular   Manufacturer: [place]   Lot: [id number]   [place]: [id number]

## 2019-11-28 ENCOUNTER — Other Ambulatory Visit: Payer: Self-pay | Admitting: Family Medicine

## 2019-11-28 DIAGNOSIS — J302 Other seasonal allergic rhinitis: Secondary | ICD-10-CM

## 2019-12-01 ENCOUNTER — Other Ambulatory Visit: Payer: Self-pay | Admitting: Family Medicine

## 2019-12-01 ENCOUNTER — Telehealth: Payer: Self-pay

## 2019-12-01 DIAGNOSIS — I959 Hypotension, unspecified: Secondary | ICD-10-CM

## 2019-12-01 MED ORDER — MIDODRINE HCL 2.5 MG PO TABS: 2.5000 mg | ORAL_TABLET | Freq: Every day | ORAL | 1 refills | Status: DC

## 2019-12-01 NOTE — Telephone Encounter (Signed)
Please advise 

## 2019-12-01 NOTE — Telephone Encounter (Signed)
Sent in

## 2019-12-01 NOTE — Telephone Encounter (Signed)
[name] notified that medication was sent in.

## 2019-12-01 NOTE — Telephone Encounter (Signed)
Michelle [name] from [place] in to see if Dr. Etter Sjogren could send in a prescription in for (Nidodrine) 2.5 mg Once a day for Orthostatic Hypotension  Please send it to  [id number] - [place], [place] - [address]  [address], [place] [address]  Phone:  [phone] Fax:  [phone]  DEA #:  [id number]  If there is any questions please call Michelle [name] at [phone]

## 2019-12-05 ENCOUNTER — Ambulatory Visit: Payer: Medicare HMO | Admitting: Neurology

## 2019-12-05 ENCOUNTER — Encounter: Payer: Self-pay | Admitting: Neurology

## 2019-12-05 ENCOUNTER — Other Ambulatory Visit: Payer: Self-pay

## 2019-12-05 DIAGNOSIS — R69 Illness, unspecified: Secondary | ICD-10-CM | POA: Diagnosis not present

## 2019-12-05 DIAGNOSIS — G44209 Tension-type headache, unspecified, not intractable: Secondary | ICD-10-CM

## 2019-12-05 DIAGNOSIS — F0280 Dementia in other diseases classified elsewhere without behavioral disturbance: Secondary | ICD-10-CM

## 2019-12-05 DIAGNOSIS — G301 Alzheimer's disease with late onset: Secondary | ICD-10-CM

## 2019-12-05 MED ORDER — GABAPENTIN 300 MG PO CAPS: ORAL_CAPSULE | ORAL | 3 refills | Status: DC

## 2019-12-05 MED ORDER — DONEPEZIL HCL 10 MG PO TABS: ORAL_TABLET | ORAL | 3 refills | Status: DC

## 2019-12-05 MED ORDER — MEMANTINE HCL 10 MG PO TABS: 10.0000 mg | ORAL_TABLET | Freq: Two times a day (BID) | ORAL | 3 refills | Status: DC

## 2019-12-05 MED ORDER — SERTRALINE HCL 100 MG PO TABS: 100.0000 mg | ORAL_TABLET | Freq: Every day | ORAL | 3 refills | Status: DC

## 2019-12-05 NOTE — Patient Instructions (Signed)
Great seeing you! Continue all your medications. Recommend starting to look into day programs to get out more for exercise. Follow-up in 6 months, call for any changes.  FALL PRECAUTIONS: Be cautious when walking. Scan the area for obstacles that may increase the risk of trips and falls. When getting up in the mornings, sit up at the edge of the bed for a few minutes before getting out of bed. Consider elevating the bed at the head end to avoid drop of blood pressure when getting up. Walk always in a well-lit room (use night lights in the walls). Avoid area rugs or power cords from appliances in the middle of the walkways. Use a walker or a cane if necessary and consider physical therapy for balance exercise. Get your eyesight checked regularly.   HOME SAFETY: Consider the safety of the kitchen when operating appliances like stoves, microwave oven, and blender. Consider having supervision and share cooking responsibilities until no longer able to participate in those. Accidents with firearms and other hazards in the house should be identified and addressed as well.   ABILITY TO BE LEFT ALONE: If patient is unable to contact 911 operator, consider using LifeLine, or when the need is there, arrange for someone to stay with patients. Smoking is a fire hazard, consider supervision or cessation. Risk of wandering should be assessed by caregiver and if detected at any point, supervision and safe proof recommendations should be instituted.  RECOMMENDATIONS FOR ALL PATIENTS WITH MEMORY PROBLEMS: 1. Continue to exercise (Recommend 30 minutes of walking everyday, or 3 hours every week) 2. Increase social interactions - continue going to [place] and enjoy social gatherings with friends and family 3. Eat healthy, avoid fried foods and eat more fruits and vegetables 4. Maintain adequate blood pressure, blood sugar, and blood cholesterol level. Reducing the risk of stroke and cardiovascular disease also helps  promoting better memory. 5. Avoid stressful situations. Live a simple life and avoid aggravations. Organize your time and prepare for the next day in anticipation. 6. Sleep well, avoid any interruptions of sleep and avoid any distractions in the bedroom that may interfere with adequate sleep quality 7. Avoid sugar, avoid sweets as there is a strong link between excessive sugar intake, diabetes, and cognitive impairment The Mediterranean diet has been shown to help patients reduce the risk of progressive memory disorders and reduces cardiovascular risk. This includes eating fish, eat fruits and green leafy vegetables, nuts like almonds and hazelnuts, walnuts, and also use olive oil. Avoid fast foods and fried foods as much as possible. Avoid sweets and sugar as sugar use has been linked to worsening of memory function.  There is always a concern of gradual progression of memory problems. If this is the case, then we may need to adjust level of care according to patient needs. Support, both to the patient and caregiver, should then be put into place.

## 2019-12-05 NOTE — Progress Notes (Signed)
NEUROLOGY FOLLOW UP OFFICE NOTE  Michelle [name] UO:[id number]  DOB: 10-14-1945  HISTORY OF PRESENT ILLNESS: I had the pleasure of seeing Michelle [name] in follow-up in the neurology clinic on 12/05/2019. She was last seen over 6 months ago for Alzheimer's dementia and is again accompanied by her husband who helps supplement the history today. She is on Donepezil 10mg  daily and Memantine 10mg  BID without side effects. Since her last visit, she is notable less conversant and interactive today. Her husband reports that she struggles to remember her 2 daughter's names, sometimes does not recognize that they are married. She asked if she could take him to his home one time. She was initially having hypotension and was taken off BP and cholesterol medication. Her husband reports an episode of hypotension and dehydration in January where she slumped down and was unresponsive for a few seconds. They cut down on caffeine intake. He reports3 days ago she was restarted on midodrine. She is sleeping well, her husband notes that her exercise and activity are "zero." She mostly lays down all day with no motivation, he lets her eat in bed. She has a visiting nurse who reminds her she needs to get up but she has not done anything. No paranoia or hallucinations, there are a couple of mornings where she reports bad dreams. No falls. She reports headaches once in a while, however her husband reminds her she had it consistently for 2 weeks, he was given prn medication 2-3 times a day, but this had quieted down the last week or so. She is on gabapentin 300mg  BID. He reports she has frequent UTIs. She is independent with dressing and bathing.   Last MRI brain with and without contrast done 04/2019 for worsening headaches and confusion did not show any acute changes, there was minimal change from last MRI in 2018 with mild to moderate diffuse atrophy, mild chronic microvascular disease. The meningioma along the dorsal aspect of the  clivus to the right of midline was not significantly changed, 1.9 by 1.0 x 2.5 cm, with mass effect on pons and midbrain unchanged.   History on Initial Assessment 04/02/2016: This is a pleasant 74 yo RH woman with a history of hypertension, hyperlipidemia, hypothyroidism, who presented for evaluation of memory loss. Her daughter started noticing memory changes a year ago. The patient states she can tell a difference in her memory, but that her family notices it more and "they may think I'm in denial." Her family has noticed worsening over the past 6 months, she repeats herself several times, sometimes having the same conversation four times within an hour. Her husband reports that she asked him repeatedly last 52th of July weekend what he was wearing to go home. She misplaces things frequently in the house. She left the stove on one time boiling water. She takes longer to get ready to go out. She denies getting lost driving, no missed bills or medications. 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 Her family also expressed concern about personality changes, she used to be laidback but now her patience is not there. She is cussing a lot more easily now. She doesn't want to do things she used to like. She does not like going out, and stayed in the room while at the beach more than with her family. No paranoia. No difficulties with ADLs. There is no family history of dementia, she  denies any history of head injuries, alcohol intake. She has been having dull right frontal headaches for the past 6 months attributed to her sinuses, no associated nausea/vomiting. She denies any dizziness, diplopia, dysarthria/dysphagia, focal numbness/tingling/weakness, bowel/bladder dysfunction. She has occasional neck and left shoulder pain. No falls.   She had an MMSE done with her PCP this month, MMSE 25/30. TSH and B12 unremarkable. She has been unable to do  the MRI brain.  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 Post-contrast MRI was ordered, which again showed the 40mm enhancing dural-based mass posterior to the clivus on the right. There is mass effect on the anterior pons on the right which is mildly compressed. The mass is touching the basilar causing mild displacement to the left. There is no definite invasion of the sella. It may be touching the cisternal segment of the trigeminal nerve.   PAST MEDICAL HISTORY: Past Medical History:  Diagnosis Date  . Adenomatous colon polyp   . Allergy   . GERD (gastroesophageal reflux disease)   . Glaucoma   . Hx of hearing loss    bilateral hearing aids  . Hyperlipidemia   . Hypertension   . IBS (irritable bowel syndrome)   . Low back pain   . Thyroid disease     MEDICATIONS: Current Outpatient Medications on File Prior to Visit  Medication Sig Dispense Refill  . AMBULATORY NON FORMULARY MEDICATION IBgard: Takes prn    . aspirin 81 MG tablet Take 81 mg by mouth daily.     . cephALEXin (KEFLEX) 500 MG capsule Take 1 capsule (500 mg total) by mouth 2 (two) times daily. 20 capsule 0  . donepezil (ARICEPT) 10 MG tablet Take 1 tablet daily 90 tablet 3  . fluticasone (FLONASE) 50 MCG/ACT nasal spray Place 2 sprays into both nostrils daily. 16 g 5  . gabapentin (NEURONTIN) 300 MG capsule Take 1 cap twice a day 180 capsule 3  . HYDROcodone-acetaminophen (NORCO/VICODIN) 5-325 MG tablet Take 1 tablet by mouth every 6 (six) hours as needed for moderate pain. 30 tablet 0  . levothyroxine (SYNTHROID) 50 MCG tablet TAKE 1 TABLET BY MOUTH EVERY DAY 90 tablet 3  . memantine (NAMENDA) 10 MG tablet Take 1 tablet (10 mg total) by mouth 2 (two) times daily. 180 tablet 3  . midodrine (PROAMATINE) 2.5 MG tablet Take 1 tablet (2.5 mg total) by mouth daily. 90 tablet 1  . Multiple  Vitamin (MULTIVITAMIN) tablet Take 1 tablet by mouth daily.    . nitrofurantoin, macrocrystal-monohydrate, (MACROBID) 100 MG capsule Take 1 capsule (100 mg total) by mouth 2 (two) times daily. 14 capsule 0  . pravastatin (PRAVACHOL) 20 MG tablet TAKE 1 TABLET BY MOUTH EVERY DAY 90 tablet 3  . sertraline (ZOLOFT) 100 MG tablet Take 1 tablet (100 mg total) by mouth daily. 90 tablet 3  . ALPHAGAN P 0.1 % SOLN      No current facility-administered medications on file prior to visit.    ALLERGIES: Allergies  Allergen Reactions  . No Known Allergies     FAMILY HISTORY: Family History  Problem Relation Age of Onset  . Macular degeneration Father   . Prostate cancer Father   . Hypertension Father   . Cancer Father        prostate  . Glaucoma Mother   . Emphysema Mother   .  Hypertension Mother   . Other Brother        Brain tumor  . Cancer Brother 59       brain tumor  . Breast cancer Maternal Grandmother   . Prostate cancer Brother     SOCIAL HISTORY: Social History   Socioeconomic History  . Marital status: Married    Spouse name: Juanda Crumble, "Butch"  . Number of children: 2  . Years of education: Not on file  . Highest education level: Not on file  Occupational History  . Occupation: retired    Fish farm manager: UNEMPLOYED  Tobacco Use  . Smoking status: Never Smoker  . Smokeless tobacco: Never Used  Substance and Sexual Activity  . Alcohol use: No  . Drug use: No  . Sexual activity: Yes    Partners: Male    Birth control/protection: Surgical  Other Topics Concern  . Not on file  Social History Narrative   Exercise--no   Lives with husband   One story home   Right handed   Social Determinants of Health   Financial Resource Strain:   . Difficulty of Paying Living Expenses:   Food Insecurity:   . Worried About Charity fundraiser in the Last Year:   . Arboriculturist in the Last Year:   Transportation Needs:   . Film/video editor (Medical):   Marland Kitchen Lack of  Transportation (Non-Medical):   Physical Activity:   . Days of Exercise per Week:   . Minutes of Exercise per Session:   Stress:   . Feeling of Stress :   Social Connections:   . Frequency of Communication with Friends and Family:   . Frequency of Social Gatherings with Friends and Family:   . Attends Religious Services:   . Active Member of Clubs or Organizations:   . Attends Archivist Meetings:   Marland Kitchen Marital Status:   Intimate Partner Violence:   . Fear of Current or Ex-Partner:   . Emotionally Abused:   Marland Kitchen Physically Abused:   . Sexually Abused:     REVIEW OF SYSTEMS: Constitutional: No fevers, chills, or sweats, no generalized fatigue, change in appetite Eyes: No visual changes, double vision, eye pain Ear, nose and throat: No hearing loss, ear pain, nasal congestion, sore throat Cardiovascular: No chest pain, palpitations Respiratory:  No shortness of breath at rest or with exertion, wheezes GastrointestinaI: No nausea, vomiting, diarrhea, abdominal pain, fecal incontinence Genitourinary:  No dysuria, urinary retention or frequency Musculoskeletal:  No neck pain, back pain Integumentary: No rash, pruritus, skin lesions Neurological: as above Psychiatric: No depression, insomnia, anxiety Endocrine: No palpitations, fatigue, diaphoresis, mood swings, change in appetite, change in weight, increased thirst Hematologic/Lymphatic:  No anemia, purpura, petechiae. Allergic/Immunologic: no itchy/runny eyes, nasal congestion, recent allergic reactions, rashes  PHYSICAL EXAM: Vitals:   12/05/19 1132  BP: (!) 100/59  Pulse: 77  SpO2: 99%   General: No acute distress, flat affect Skin/Extremities: No rash, no edema Neurological Exam: alert and oriented to person. No aphasia or dysarthria. Fund of knowledge is reduced. Reduced fluency.  Remote and recent memory impaired. Attention and concentration are reduced. SLUMS score 6/30.  [employer] Mental Exam 12/05/2019   Weekday Correct 0  Current year 0  What state are we in? 0  Amount spent 0  Amount left 0  # of Animals 1  5 objects recall 0  Number series 1  Hour markers 0  Time correct 0  Placed X in triangle correctly 1  Largest Figure 1  Name of female 2  Date back to work 0  Type of work 0  State she lived in 0  Total score [place] Cognitive Assessment  06/03/2019 04/02/2016  Visuospatial/ Executive (0/5) 1 3  Naming (0/3) 2 3  Attention: Read list of digits (0/2) 2 1  Attention: Read list of letters (0/1) 1 1  Attention: Serial 7 subtraction starting at 100 (0/3) 0 3  Language: Repeat phrase (0/2) 0 2  Language : Fluency (0/1) 0 1  Abstraction (0/2) 0 1  Delayed Recall (0/5) 0 1  Orientation (0/6) 3 5  Total 9 21  Adjusted Score (based on education) 10 21   Cranial nerves: Extraocular movements intact with no nystagmus. No facial asymmetry. Motor: 5/5 throughout with no pronator drift. No incoordination on finger to nose testing. Gait: narrow-based and steady, able to tandem walk adequately. Negative Romberg test.   IMPRESSION: This is a pleasant 74 yo RH woman with a history of hypertension, hyperlipidemia, hypothyroidism, with moderate dementia, likely due to Alzheimer's disease. She continues to have progressive decline. SLUMS score today 6/30. Her husband is noticing more apathy and decreased motivation. She is on Donepezil 10mg  daily and Memantine 10mg  BID. She is on Sertraline 100mg  daily, consider increasing dose in the future. She was encouraged to participate in day programs to keep her active in the daytime. Sleep is good. Continue gabapentin 300mg  BID for headache prophylaxis. Continue close supervision. Caregiver support also provided today. She does not drive. Follow-up in 6 months, they know to call for any changes.   Thank you for allowing me to participate in her care.  Please do not hesitate to call for any questions or concerns.   Ellouise Newer, M.D.   CC:  Dr. Cheri Rous

## 2019-12-06 ENCOUNTER — Encounter: Payer: Medicare HMO | Admitting: Family Medicine

## 2019-12-12 ENCOUNTER — Encounter: Payer: Medicare HMO | Admitting: Family Medicine

## 2019-12-23 ENCOUNTER — Other Ambulatory Visit: Payer: Self-pay

## 2019-12-23 DIAGNOSIS — H401132 Primary open-angle glaucoma, bilateral, moderate stage: Secondary | ICD-10-CM | POA: Diagnosis not present

## 2019-12-23 DIAGNOSIS — Z961 Presence of intraocular lens: Secondary | ICD-10-CM | POA: Diagnosis not present

## 2019-12-23 DIAGNOSIS — H10413 Chronic giant papillary conjunctivitis, bilateral: Secondary | ICD-10-CM | POA: Diagnosis not present

## 2019-12-26 ENCOUNTER — Other Ambulatory Visit: Payer: Self-pay

## 2019-12-26 ENCOUNTER — Ambulatory Visit (INDEPENDENT_AMBULATORY_CARE_PROVIDER_SITE_OTHER): Payer: Medicare HMO | Admitting: Family Medicine

## 2019-12-26 ENCOUNTER — Encounter: Payer: Self-pay | Admitting: Family Medicine

## 2019-12-26 VITALS — BP 122/58 | HR 67 | Temp 97.7°F | Resp 18 | Ht 64.0 in | Wt 131.0 lb

## 2019-12-26 DIAGNOSIS — K635 Polyp of colon: Secondary | ICD-10-CM

## 2019-12-26 DIAGNOSIS — R82998 Other abnormal findings in urine: Secondary | ICD-10-CM

## 2019-12-26 DIAGNOSIS — F0280 Dementia in other diseases classified elsewhere without behavioral disturbance: Secondary | ICD-10-CM

## 2019-12-26 DIAGNOSIS — Z1159 Encounter for screening for other viral diseases: Secondary | ICD-10-CM | POA: Diagnosis not present

## 2019-12-26 DIAGNOSIS — G300 Alzheimer's disease with early onset: Secondary | ICD-10-CM | POA: Diagnosis not present

## 2019-12-26 DIAGNOSIS — E785 Hyperlipidemia, unspecified: Secondary | ICD-10-CM

## 2019-12-26 DIAGNOSIS — Z87440 Personal history of urinary (tract) infections: Secondary | ICD-10-CM | POA: Diagnosis not present

## 2019-12-26 DIAGNOSIS — Z0000 Encounter for general adult medical examination without abnormal findings: Secondary | ICD-10-CM

## 2019-12-26 DIAGNOSIS — E039 Hypothyroidism, unspecified: Secondary | ICD-10-CM

## 2019-12-26 DIAGNOSIS — R69 Illness, unspecified: Secondary | ICD-10-CM | POA: Diagnosis not present

## 2019-12-26 LAB — POC URINALSYSI DIPSTICK (AUTOMATED)
Bilirubin, UA: NEGATIVE
Blood, UA: NEGATIVE
Glucose, UA: NEGATIVE
Ketones, UA: NEGATIVE
Nitrite, UA: NEGATIVE
Protein, UA: POSITIVE — AB
Spec Grav, UA: 1.02 (ref 1.010–1.025)
Urobilinogen, UA: 0.2 E.U./dL
pH, UA: 6 (ref 5.0–8.0)

## 2019-12-26 NOTE — Progress Notes (Signed)
D Subjective:     Michelle [name] is a 74 y.o. female and is here for a comprehensive physical exam. The patient reports no new problesms---  pt husband is giving hx  Dementia is progressing.  Pt has good days and bad.  .  Social History   Socioeconomic History  . Marital status: Married    Spouse name: Juanda Crumble, "Butch"  . Number of children: 2  . Years of education: Not on file  . Highest education level: Not on file  Occupational History  . Occupation: retired    Fish farm manager: UNEMPLOYED  Tobacco Use  . Smoking status: Never Smoker  . Smokeless tobacco: Never Used  Substance and Sexual Activity  . Alcohol use: No  . Drug use: No  . Sexual activity: Yes    Partners: Male    Birth control/protection: Surgical  Other Topics Concern  . Not on file  Social History Narrative   Exercise--no   Lives with husband   One story home   Right handed   Social Determinants of Health   Financial Resource Strain:   . Difficulty of Paying Living Expenses:   Food Insecurity:   . Worried About Charity fundraiser in the Last Year:   . Arboriculturist in the Last Year:   Transportation Needs:   . Film/video editor (Medical):   Marland Kitchen Lack of Transportation (Non-Medical):   Physical Activity:   . Days of Exercise per Week:   . Minutes of Exercise per Session:   Stress:   . Feeling of Stress :   Social Connections:   . Frequency of Communication with Friends and Family:   . Frequency of Social Gatherings with Friends and Family:   . Attends Religious Services:   . Active Member of Clubs or Organizations:   . Attends Archivist Meetings:   Marland Kitchen Marital Status:   Intimate Partner Violence:   . Fear of Current or Ex-Partner:   . Emotionally Abused:   Marland Kitchen Physically Abused:   . Sexually Abused:    Health Maintenance  Topic Date Due  . Hepatitis C Screening  Never done  . TETANUS/TDAP  07/03/2015  . COLONOSCOPY  12/11/2019  . MAMMOGRAM  01/26/2020  . INFLUENZA VACCINE   04/08/2020  . DEXA SCAN  Completed  . COVID-19 Vaccine  Completed  . PNA vac Low Risk Adult  Completed    The following portions of the patient's history were reviewed and updated as appropriate:  She  has a past medical history of Adenomatous colon polyp, Allergy, GERD (gastroesophageal reflux disease), Glaucoma, hearing loss, Hyperlipidemia, Hypertension, IBS (irritable bowel syndrome), Low back pain, and Thyroid disease. She does not have any pertinent problems on file. She  has a past surgical history that includes Tubal ligation (1977); Cholecystectomy (1998); Abdominal hysterectomy (1998); Gum surgery (1996 / 2013); and Cataract extraction, bilateral (Bilateral, 04/2015). Her family history includes Breast cancer in her maternal grandmother; Cancer in her father; Cancer (age of onset: 58) in her brother; Emphysema in her mother; Glaucoma in her mother; Hypertension in her father and mother; Macular degeneration in her father; Other in her brother; Prostate cancer in her brother and father. She  reports that she has never smoked. She has never used smokeless tobacco. She reports that she does not drink alcohol or use drugs. She has a current medication list which includes the following prescription(s): alphagan p, AMBULATORY NON FORMULARY MEDICATION, aspirin, donepezil, fluticasone, gabapentin, hydrocodone-acetaminophen, levothyroxine, memantine, midodrine, multivitamin,  nitrofurantoin (macrocrystal-monohydrate), pravastatin, and sertraline. Current Outpatient Medications on File Prior to Visit  Medication Sig Dispense Refill  . ALPHAGAN P 0.1 % SOLN     . AMBULATORY NON FORMULARY MEDICATION IBgard: Takes prn    . aspirin 81 MG tablet Take 81 mg by mouth daily.     Marland Kitchen donepezil (ARICEPT) 10 MG tablet Take 1 tablet daily 90 tablet 3  . fluticasone (FLONASE) 50 MCG/ACT nasal spray Place 2 sprays into both nostrils daily. 16 g 5  . gabapentin (NEURONTIN) 300 MG capsule Take 1 cap twice a day 180  capsule 3  . HYDROcodone-acetaminophen (NORCO/VICODIN) 5-325 MG tablet Take 1 tablet by mouth every 6 (six) hours as needed for moderate pain. 30 tablet 0  . levothyroxine (SYNTHROID) 50 MCG tablet TAKE 1 TABLET BY MOUTH EVERY DAY 90 tablet 3  . memantine (NAMENDA) 10 MG tablet Take 1 tablet (10 mg total) by mouth 2 (two) times daily. 180 tablet 3  . midodrine (PROAMATINE) 2.5 MG tablet Take 1 tablet (2.5 mg total) by mouth daily. 90 tablet 1  . Multiple Vitamin (MULTIVITAMIN) tablet Take 1 tablet by mouth daily.    . nitrofurantoin, macrocrystal-monohydrate, (MACROBID) 100 MG capsule Take 1 capsule (100 mg total) by mouth 2 (two) times daily. 14 capsule 0  . pravastatin (PRAVACHOL) 20 MG tablet TAKE 1 TABLET BY MOUTH EVERY DAY 90 tablet 3  . sertraline (ZOLOFT) 100 MG tablet Take 1 tablet (100 mg total) by mouth daily. 90 tablet 3   No current facility-administered medications on file prior to visit.   She is allergic to no known allergies..  Review of Systems  Review of Systems  Constitutional: Negative for activity change, appetite change and fatigue.  HENT: + hearing ear s, congestion, tinnitus and ear discharge.   Eyes:  (see optho q1y -- + glaucoma worsening Respiratory: Negative for cough, chest tightness and shortness of breath.   Cardiovascular: Negative for chest pain, palpitations and leg swelling.  Gastrointestinal: Negative for abdominal  pain, diarrhea, constipation and abdominal distention.  Genitourinary: Negative for urgency, frequency, decreased urine volume and difficulty urinating.  Musculoskeletal: Negative for back pain, arthralgias and gait problem.  Skin: Negative for color change, pallor and rash.  Neurological: Negative for dizziness, light-headedness, numbness and headaches.  Hematological: Negative for adenopathy. Does not bruise/bleed easily.  Psychiatric/Behavioral: Negative for suicidal ideas, confusion, sleep disturbance, self-injury, dysphoric mood,  decreased concentration and agitation.  Pt is able to read and write and can do all ADLs No risk for falling No abuse/ violence in home     Objective:    BP (!) 122/58 (BP Location: Right Arm, Patient Position: Sitting, Cuff Size: Normal)   Pulse 67   Temp 97.7 F (36.5 C) (Temporal)   Resp 18   Ht 5\' 4"  (1.626 m)   Wt 131 lb (59.4 kg)   SpO2 99%   BMI 22.49 kg/m  General appearance: alert, cooperative, appears stated age and no distress Head: Normocephalic, without obvious abnormality, atraumatic Eyes: negative findings: lids and lashes normal and pupils equal, round, reactive to light and accomodation Ears: normal TM's and external ear canals both ears Neck: no adenopathy, no carotid bruit, no JVD, supple, symmetrical, trachea midline and thyroid not enlarged, symmetric, no tenderness/mass/nodules Back: symmetric, no curvature. ROM normal. No CVA tenderness. Lungs: clear to auscultation bilaterally Breasts: normal appearance, no masses or tenderness Heart: regular rate and rhythm, S1, S2 normal, no murmur, click, rub or gallop Abdomen: soft, non-tender; bowel sounds  normal; no masses,  no organomegaly Pelvic: not indicated; status post hysterectomy, negative ROS Extremities: extremities normal, atraumatic, no cyanosis or edema Pulses: 2+ and symmetric Skin: Skin color, texture, turgor normal. No rashes or lesions Lymph nodes: Cervical, supraclavicular, and axillary nodes normal. Neurologic: Mental status: worsening dementia     Assessment:    Healthy female exam.      Plan:    ghm utd Check labs  See After Visit Summary for Counseling Recommendations    1. Hyperlipidemia, unspecified hyperlipidemia type Tolerating statin, encouraged heart healthy diet, avoid trans fats, minimize simple carbs and saturated fats. Increase exercise as tolerated - Lipid panel - Comprehensive metabolic panel  2. Hypothyroidism, unspecified type Check labs and con't meds - TSH   3.  Need for hepatitis C screening test  - Hepatitis C antibody  4. Polyp of colon, unspecified part of colon, unspecified type  - Ambulatory referral to Gastroenterology  5. Preventative health care See above   6. History of UTI Check urine  - POCT Urinalysis Dipstick (Automated) - Urine Culture  7. Leukocytes in urine   - Urine Culture  8. Early onset Alzheimer's dementia without behavioral disturbance [facility]) Per neuro

## 2019-12-26 NOTE — Patient Instructions (Addendum)
Preventive Care 38 Years and Older, Female Preventive care refers to lifestyle choices and visits with your health care provider that can promote health and wellness. This includes:  A yearly physical exam. This is also called an annual well check.  Regular dental and eye exams.  Immunizations.  Screening for certain conditions.  Healthy lifestyle choices, such as diet and exercise. What can I expect for my preventive care visit? Physical exam Your health care provider will check:  Height and weight. These may be used to calculate body mass index (BMI), which is a measurement that tells if you are at a healthy weight.  Heart rate and blood pressure.  Your skin for abnormal spots. Counseling Your health care provider may ask you questions about:  Alcohol, tobacco, and drug use.  Emotional well-being.  Home and relationship well-being.  Sexual activity.  Eating habits.  History of falls.  Memory and ability to understand (cognition).  Work and work Statistician.  Pregnancy and menstrual history. What immunizations do I need?  Influenza (flu) vaccine  This is recommended every year. Tetanus, diphtheria, and pertussis (Tdap) vaccine  You may need a Td booster every 10 years. Varicella (chickenpox) vaccine  You may need this vaccine if you have not already been vaccinated. Zoster (shingles) vaccine  You may need this after age 33. Pneumococcal conjugate (PCV13) vaccine  One dose is recommended after age 33. Pneumococcal polysaccharide (PPSV23) vaccine  One dose is recommended after age 72. Measles, mumps, and rubella (MMR) vaccine  You may need at least one dose of MMR if you were born in 1957 or later. You may also need a second dose. Meningococcal conjugate (MenACWY) vaccine  You may need this if you have certain conditions. Hepatitis A vaccine  You may need this if you have certain conditions or if you travel or work in places where you may be exposed  to hepatitis A. Hepatitis B vaccine  You may need this if you have certain conditions or if you travel or work in places where you may be exposed to hepatitis B. Haemophilus influenzae type b (Hib) vaccine  You may need this if you have certain conditions. You may receive vaccines as individual doses or as more than one vaccine together in one shot (combination vaccines). Talk with your health care provider about the risks and benefits of combination vaccines. What tests do I need? Blood tests  Lipid and cholesterol levels. These may be checked every 5 years, or more frequently depending on your overall health.  Hepatitis C test.  Hepatitis B test. Screening  Lung cancer screening. You may have this screening every year starting at age 39 if you have a 30-pack-year history of smoking and currently smoke or have quit within the past 15 years.  Colorectal cancer screening. All adults should have this screening starting at age 36 and continuing until age 15. Your health care provider may recommend screening at age 23 if you are at increased risk. You will have tests every 1-10 years, depending on your results and the type of screening test.  Diabetes screening. This is done by checking your blood sugar (glucose) after you have not eaten for a while (fasting). You may have this done every 1-3 years.  Mammogram. This may be done every 1-2 years. Talk with your health care provider about how often you should have regular mammograms.  BRCA-related cancer screening. This may be done if you have a family history of breast, ovarian, tubal, or peritoneal cancers.  Other tests  Sexually transmitted disease (STD) testing.  Bone density scan. This is done to screen for osteoporosis. You may have this done starting at age 96. Follow these instructions at home: Eating and drinking  Eat a diet that includes fresh fruits and vegetables, whole grains, lean protein, and low-fat dairy products. Limit  your intake of foods with high amounts of sugar, saturated fats, and salt.  Take vitamin and mineral supplements as recommended by your health care provider.  Do not drink alcohol if your health care provider tells you not to drink.  If you drink alcohol: ? Limit how much you have to 0-1 drink a day. ? Be aware of how much alcohol is in your drink. In the [place] one drink equals one 12 oz bottle of beer (355 mL), one 5 oz glass of wine (148 mL), or one 1 oz glass of hard liquor (44 mL). Lifestyle  Take daily care of your teeth and gums.  Stay active. Exercise for at least 30 minutes on 5 or more days each week.  Do not use any products that contain nicotine or tobacco, such as cigarettes, e-cigarettes, and chewing tobacco. If you need help quitting, ask your health care provider.  If you are sexually active, practice safe sex. Use a condom or other form of protection in order to prevent STIs (sexually transmitted infections).  Talk with your health care provider about taking a low-dose aspirin or statin. What's next?  Go to your health care provider once a year for a well check visit.  Ask your health care provider how often you should have your eyes and teeth checked.  Stay up to date on all vaccines. This information is not intended to replace advice given to you by your health care provider. Make sure you discuss any questions you have with your health care provider. Document Revised: 08/19/2018 Document Reviewed: 08/19/2018 Elsevier Patient Education  2020 [place] Disease Caregiver Guide  Alzheimer disease is a brain disease that causes memory loss and changes in behavior. People with Alzheimer disease often have problems paying attention, communicating, and doing routine tasks. The disease gets worse over time, and people with the disease eventually need full-time care. Taking care of someone with Alzheimer disease can be challenging and overwhelming. This  guide provides helpful information and tips that can make caring for someone with the disease a little easier. What changes does Alzheimer disease cause? Alzheimer disease causes a person to lose the ability to remember things and make decisions. Memory loss and confusion are usually mild at the start of the disease, but they get more severe over time. Eventually, the person may not recognize friends, family members, or familiar places. Alzheimer disease can also cause hallucinations, changes in behavior, and changes in mood, such as anxiety or anger. The changes can come on suddenly. They may happen in response to something such as:  Pain.  An infection.  Changes in environment, such as changes in temperature or noise.  Overstimulation.  Feeling lost or scared. Tips for managing symptoms  Be calm and patient.  Give short, simple answers to questions. Long answers can overwhelm and confuse the person.  Avoid correcting the person in a negative way.  Try not to take things personally, even if the person forgets your name. Understand that changes are a part of the disease process.  Do not argue or try to convince the person about a specific point. Doing that may make the  person feel more agitated. Tips for reducing frustration  Make appointments and do daily tasks, like bathing and dressing, when the person is at his or her best.  Allow for plenty of time for simple tasks because they may take longer than expected. Take your time when doing these tasks.  Limit the person's choices. Too many choices can be overwhelming and stressful for the person.  Involve the person in what you are doing.  Keep a daily routine.  Avoid crowds and new situations, if possible.  Use simple words, short sentences, and a calm voice. Only give one direction at a time.  Buy clothes and shoes that are easy to put on and take off.  Organize medications in a pillbox for each day of the week.  Keep a  calendar in a central location to remind the person of appointments or other activities.  Ask about respite care resources so that you can have a regular break from the stress of caregiving. Tips for reducing the risk of injury  Keep floors clear of clutter. Remove rugs, magazine racks, and floor lamps.  Keep hallways well-lit, especially at night.  Put a handrail   Put locks on doors. Put the locks in places where the person cannot see or reach them easily. This will help ensure that the person does not wander out of the house and get lost.  Be prepared for emergencies. Keep a list of emergency phone numbers and addresses in a convenient area.  Remove car keys and lock garage doors so that the person does not try to get in the car and drive.  A certain type of bracelet may be worn that tracks a person's location and identifies him or her as having memory problems. This should be worn at all times for safety. Tips for future planning  Discuss financial and legal planning early on in the course of the disease. People with Alzheimer disease will have trouble managing their money as the disease gets worse. Get help from professional advisers regarding financial and legal matters.  Discuss advance directives, safety, and daily care. Take these steps: ? Create a living will and choose a power of attorney. The person with power of attorney will be able to make decisions for the person with Alzheimer disease when he or she is no longer able to. ? Discuss driving safety and when to stop driving. The person's health care provider can help provide assistance with this decision. ? Discuss the person's living situation. If the person lives alone, make sure he or she is safe. People who live at home may need extra help from home health caregivers, and those who live in a nursing home or care center may need more care. Where to find support One way to find support is to join a local support group.  Advantages of being part of a support group include:  Learning strategies to manage stress.  Sharing experiences with others.  Receiving emotional comfort and support.  Learning about caregiving as the disease progresses.  Knowing what community resources are available and making use of them. Where to find more information  Alzheimer[employer] Association: [employer] Contact a health care provider if:  The person has a fever.  The person has a sudden change in behavior that does not improve with calming strategies.  The person is unable to manage in his or her current living situation.  The person threatens himself or herself, you, or anyone else.  You are no longer able to  care for the person. Summary  Alzheimer disease is a brain disease that causes memory loss and changes in behavior.  People with Alzheimer disease often have problems paying attention, communicating, and doing routine tasks. The disease gets worse over time, and people with the disease eventually need full-time care.  Take steps to reduce the person's risk of injury, and plan for future care.  Taking care of someone with Alzheimer disease can be very challenging and overwhelming. One way to find support during this time is to join a local support group. This information is not intended to replace advice given to you by your health care provider. Make sure you discuss any questions you have with your health care provider. Document Revised: 12/14/2018 Document Reviewed: 09/26/2016 Elsevier Patient Education  [place].

## 2019-12-26 NOTE — Assessment & Plan Note (Signed)
ghm utd Check labs  See avs  

## 2019-12-27 LAB — COMPREHENSIVE METABOLIC PANEL
ALT: 13 U/L (ref 0–35)
AST: 20 U/L (ref 0–37)
Albumin: 4.2 g/dL (ref 3.5–5.2)
Alkaline Phosphatase: 52 U/L (ref 39–117)
BUN: 20 mg/dL (ref 6–23)
CO2: 29 mEq/L (ref 19–32)
Calcium: 8.9 mg/dL (ref 8.4–10.5)
Chloride: 105 mEq/L (ref 96–112)
Creatinine, Ser: 0.87 mg/dL (ref 0.40–1.20)
GFR: 63.67 mL/min (ref >60.00)
Glucose, Bld: 89 mg/dL (ref 70–99)
Potassium: 4.5 mEq/L (ref 3.5–5.1)
Sodium: 141 mEq/L (ref 135–145)
Total Bilirubin: 0.3 mg/dL (ref 0.2–1.2)
Total Protein: 6.6 g/dL (ref 6.0–8.3)

## 2019-12-27 LAB — TSH: TSH: 4 u[IU]/mL (ref 0.35–4.50)

## 2019-12-27 LAB — LIPID PANEL
Cholesterol: 178 mg/dL (ref 0–200)
HDL: 66.9 mg/dL (ref >39.00)
LDL Cholesterol: 95 mg/dL (ref 0–99)
NonHDL: 110.9
Total CHOL/HDL Ratio: 3
Triglycerides: 80 mg/dL (ref 0.0–149.0)
VLDL: 16 mg/dL (ref 0.0–40.0)

## 2019-12-27 LAB — HEPATITIS C ANTIBODY
Hepatitis C Ab: NONREACTIVE
SIGNAL TO CUT-OFF: 0.01 (ref <1.00)

## 2019-12-28 ENCOUNTER — Encounter: Payer: Self-pay | Admitting: Internal Medicine

## 2019-12-28 LAB — URINE CULTURE
MICRO NUMBER:: 10379022
SPECIMEN QUALITY:: ADEQUATE

## 2019-12-28 NOTE — Assessment & Plan Note (Signed)
Per neuro con't meds 

## 2019-12-29 ENCOUNTER — Other Ambulatory Visit: Payer: Self-pay | Admitting: Family Medicine

## 2019-12-29 DIAGNOSIS — N390 Urinary tract infection, site not specified: Secondary | ICD-10-CM

## 2019-12-29 MED ORDER — CEPHALEXIN 500 MG PO CAPS: 500.0000 mg | ORAL_CAPSULE | Freq: Two times a day (BID) | ORAL | 0 refills | Status: DC

## 2020-01-13 ENCOUNTER — Telehealth: Payer: Self-pay

## 2020-01-13 NOTE — Telephone Encounter (Signed)
Patient nurse Vicente Males  from [facility] called in to let Dr. Etter Sjogren know that the patient has a possible UTI the nurse Vicente Males stated that she can get a urine sample and send it off to the Lab in [place] and have the results back to [place].  If there are questions about this please call Vicente Males at  [place] to leave a message.

## 2020-01-16 NOTE — Telephone Encounter (Signed)
Yes-- ok to send urine for ua and culture

## 2020-01-16 NOTE — Telephone Encounter (Signed)
FYI. Called left VM to call back with results

## 2020-01-20 ENCOUNTER — Encounter: Payer: Self-pay | Admitting: Family Medicine

## 2020-01-20 DIAGNOSIS — N390 Urinary tract infection, site not specified: Secondary | ICD-10-CM | POA: Diagnosis not present

## 2020-01-27 ENCOUNTER — Ambulatory Visit: Payer: Medicare HMO | Admitting: *Deleted

## 2020-02-03 ENCOUNTER — Ambulatory Visit (AMBULATORY_SURGERY_CENTER): Payer: Self-pay | Admitting: *Deleted

## 2020-02-03 ENCOUNTER — Other Ambulatory Visit: Payer: Self-pay

## 2020-02-03 VITALS — Ht 64.0 in | Wt 126.0 lb

## 2020-02-03 DIAGNOSIS — Z86010 Personal history of colonic polyps: Secondary | ICD-10-CM

## 2020-02-03 MED ORDER — NA SULFATE-K SULFATE-MG SULF 17.5-3.13-1.6 GM/177ML PO SOLN: ORAL | 0 refills | Status: DC

## 2020-02-03 NOTE — Progress Notes (Signed)
Patient is here in-person for PV. Patient's husband/POA here with here in PV to assist, pt has dementia. Patient denies any allergies to eggs or soy. Patient denies any problems with anesthesia/sedation. Patient denies any oxygen use at home. Patient denies taking any diet/weight loss medications or blood thinners. Patient is not being treated for MRSA or C-diff. Patient is aware of our care-partner policy and 0000000 safety protocol. EMMI education assisgned to the patient for the procedure, this was explained and instructions given to patient.   Pt completed covid vaccines x2 on 11/23/19.

## 2020-02-07 ENCOUNTER — Encounter (HOSPITAL_COMMUNITY): Payer: Self-pay | Admitting: Emergency Medicine

## 2020-02-07 ENCOUNTER — Inpatient Hospital Stay (HOSPITAL_COMMUNITY)
Admission: EM | Admit: 2020-02-07 | Discharge: 2020-02-09 | DRG: 312 | Disposition: A | Discharge status: Home Health | Payer: Medicare HMO | Attending: Internal Medicine | Admitting: Internal Medicine

## 2020-02-07 ENCOUNTER — Observation Stay (HOSPITAL_COMMUNITY): Payer: Medicare HMO

## 2020-02-07 ENCOUNTER — Emergency Department (HOSPITAL_COMMUNITY): Payer: Medicare HMO

## 2020-02-07 DIAGNOSIS — F02818 Dementia in other diseases classified elsewhere, unspecified severity, with other behavioral disturbance: Secondary | ICD-10-CM | POA: Diagnosis present

## 2020-02-07 DIAGNOSIS — E785 Hyperlipidemia, unspecified: Secondary | ICD-10-CM | POA: Diagnosis present

## 2020-02-07 DIAGNOSIS — D649 Anemia, unspecified: Secondary | ICD-10-CM

## 2020-02-07 DIAGNOSIS — K219 Gastro-esophageal reflux disease without esophagitis: Secondary | ICD-10-CM | POA: Diagnosis present

## 2020-02-07 DIAGNOSIS — Z90710 Acquired absence of both cervix and uterus: Secondary | ICD-10-CM

## 2020-02-07 DIAGNOSIS — Z803 Family history of malignant neoplasm of breast: Secondary | ICD-10-CM

## 2020-02-07 DIAGNOSIS — N390 Urinary tract infection, site not specified: Secondary | ICD-10-CM | POA: Diagnosis not present

## 2020-02-07 DIAGNOSIS — R531 Weakness: Secondary | ICD-10-CM | POA: Diagnosis not present

## 2020-02-07 DIAGNOSIS — R69 Illness, unspecified: Secondary | ICD-10-CM | POA: Diagnosis not present

## 2020-02-07 DIAGNOSIS — F0280 Dementia in other diseases classified elsewhere without behavioral disturbance: Secondary | ICD-10-CM

## 2020-02-07 DIAGNOSIS — Z79899 Other long term (current) drug therapy: Secondary | ICD-10-CM

## 2020-02-07 DIAGNOSIS — Z825 Family history of asthma and other chronic lower respiratory diseases: Secondary | ICD-10-CM

## 2020-02-07 DIAGNOSIS — F0281 Dementia in other diseases classified elsewhere with behavioral disturbance: Secondary | ICD-10-CM | POA: Diagnosis present

## 2020-02-07 DIAGNOSIS — K589 Irritable bowel syndrome without diarrhea: Secondary | ICD-10-CM | POA: Diagnosis present

## 2020-02-07 DIAGNOSIS — H409 Unspecified glaucoma: Secondary | ICD-10-CM | POA: Diagnosis present

## 2020-02-07 DIAGNOSIS — Z8042 Family history of malignant neoplasm of prostate: Secondary | ICD-10-CM

## 2020-02-07 DIAGNOSIS — G300 Alzheimer's disease with early onset: Secondary | ICD-10-CM

## 2020-02-07 DIAGNOSIS — D329 Benign neoplasm of meninges, unspecified: Secondary | ICD-10-CM | POA: Diagnosis present

## 2020-02-07 DIAGNOSIS — I951 Orthostatic hypotension: Secondary | ICD-10-CM | POA: Diagnosis not present

## 2020-02-07 DIAGNOSIS — Z66 Do not resuscitate: Secondary | ICD-10-CM | POA: Diagnosis present

## 2020-02-07 DIAGNOSIS — Z9049 Acquired absence of other specified parts of digestive tract: Secondary | ICD-10-CM

## 2020-02-07 DIAGNOSIS — Z86010 Personal history of colonic polyps: Secondary | ICD-10-CM

## 2020-02-07 DIAGNOSIS — Z87440 Personal history of urinary (tract) infections: Secondary | ICD-10-CM

## 2020-02-07 DIAGNOSIS — Z20822 Contact with and (suspected) exposure to covid-19: Secondary | ICD-10-CM | POA: Diagnosis present

## 2020-02-07 DIAGNOSIS — E039 Hypothyroidism, unspecified: Secondary | ICD-10-CM | POA: Diagnosis present

## 2020-02-07 DIAGNOSIS — E079 Disorder of thyroid, unspecified: Secondary | ICD-10-CM | POA: Diagnosis present

## 2020-02-07 DIAGNOSIS — I959 Hypotension, unspecified: Secondary | ICD-10-CM | POA: Diagnosis not present

## 2020-02-07 DIAGNOSIS — R519 Headache, unspecified: Secondary | ICD-10-CM | POA: Diagnosis not present

## 2020-02-07 DIAGNOSIS — R55 Syncope and collapse: Secondary | ICD-10-CM | POA: Diagnosis not present

## 2020-02-07 DIAGNOSIS — Z83511 Family history of glaucoma: Secondary | ICD-10-CM

## 2020-02-07 DIAGNOSIS — R4020 Unspecified coma: Secondary | ICD-10-CM | POA: Diagnosis not present

## 2020-02-07 DIAGNOSIS — R29810 Facial weakness: Secondary | ICD-10-CM | POA: Diagnosis not present

## 2020-02-07 DIAGNOSIS — G301 Alzheimer's disease with late onset: Secondary | ICD-10-CM | POA: Diagnosis present

## 2020-02-07 DIAGNOSIS — Z8249 Family history of ischemic heart disease and other diseases of the circulatory system: Secondary | ICD-10-CM

## 2020-02-07 DIAGNOSIS — R0902 Hypoxemia: Secondary | ICD-10-CM | POA: Diagnosis not present

## 2020-02-07 DIAGNOSIS — Z79890 Hormone replacement therapy (postmenopausal): Secondary | ICD-10-CM

## 2020-02-07 DIAGNOSIS — I10 Essential (primary) hypertension: Secondary | ICD-10-CM | POA: Diagnosis present

## 2020-02-07 DIAGNOSIS — Z03818 Encounter for observation for suspected exposure to other biological agents ruled out: Secondary | ICD-10-CM | POA: Diagnosis not present

## 2020-02-07 LAB — URINALYSIS, ROUTINE W REFLEX MICROSCOPIC
Bacteria, UA: NONE SEEN
Bilirubin Urine: NEGATIVE
Glucose, UA: NEGATIVE mg/dL
Hgb urine dipstick: NEGATIVE
Ketones, ur: NEGATIVE mg/dL
Nitrite: NEGATIVE
Protein, ur: NEGATIVE mg/dL
Specific Gravity, Urine: 1.004 — ABNORMAL LOW (ref 1.005–1.030)
pH: 8 (ref 5.0–8.0)

## 2020-02-07 LAB — CBC WITH DIFFERENTIAL/PLATELET
Abs Immature Granulocytes: 0.01 10*3/uL (ref 0.00–0.07)
Basophils Absolute: 0 10*3/uL (ref 0.0–0.1)
Basophils Relative: 1 %
Eosinophils Absolute: 0.1 10*3/uL (ref 0.0–0.5)
Eosinophils Relative: 2 %
HCT: 31.3 % — ABNORMAL LOW (ref 36.0–46.0)
Hemoglobin: 10.2 g/dL — ABNORMAL LOW (ref 12.0–15.0)
Immature Granulocytes: 0 %
Lymphocytes Relative: 25 %
Lymphs Abs: 1 10*3/uL (ref 0.7–4.0)
MCH: 29.7 pg (ref 26.0–34.0)
MCHC: 32.6 g/dL (ref 30.0–36.0)
MCV: 91 fL (ref 80.0–100.0)
Monocytes Absolute: 0.4 10*3/uL (ref 0.1–1.0)
Monocytes Relative: 10 %
Neutro Abs: 2.6 10*3/uL (ref 1.7–7.7)
Neutrophils Relative %: 62 %
Platelets: 192 10*3/uL (ref 150–400)
RBC: 3.44 MIL/uL — ABNORMAL LOW (ref 3.87–5.11)
RDW: 11.9 % (ref 11.5–15.5)
WBC: 4.1 10*3/uL (ref 4.0–10.5)
nRBC: 0 % (ref 0.0–0.2)

## 2020-02-07 LAB — COMPREHENSIVE METABOLIC PANEL
ALT: 13 U/L (ref 0–44)
AST: 19 U/L (ref 15–41)
Albumin: 3.2 g/dL — ABNORMAL LOW (ref 3.5–5.0)
Alkaline Phosphatase: 38 U/L (ref 38–126)
Anion gap: 7 (ref 5–15)
BUN: 11 mg/dL (ref 8–23)
CO2: 28 mmol/L (ref 22–32)
Calcium: 8.4 mg/dL — ABNORMAL LOW (ref 8.9–10.3)
Chloride: 106 mmol/L (ref 98–111)
Creatinine, Ser: 0.75 mg/dL (ref 0.44–1.00)
GFR calc Af Amer: >60 mL/min (ref >60)
GFR calc non Af Amer: >60 mL/min (ref >60)
Glucose, Bld: 94 mg/dL (ref 70–99)
Potassium: 3.7 mmol/L (ref 3.5–5.1)
Sodium: 141 mmol/L (ref 135–145)
Total Bilirubin: 0.6 mg/dL (ref 0.3–1.2)
Total Protein: 5.4 g/dL — ABNORMAL LOW (ref 6.5–8.1)

## 2020-02-07 LAB — POC OCCULT BLOOD, ED: Fecal Occult Bld: NEGATIVE

## 2020-02-07 LAB — GLUCOSE, CAPILLARY: Glucose-Capillary: 104 mg/dL — ABNORMAL HIGH (ref 70–99)

## 2020-02-07 LAB — SARS CORONAVIRUS 2 BY RT PCR (HOSPITAL ORDER, PERFORMED IN ~~LOC~~ HOSPITAL LAB): SARS Coronavirus 2: NEGATIVE

## 2020-02-07 LAB — CBG MONITORING, ED: Glucose-Capillary: 92 mg/dL (ref 70–99)

## 2020-02-07 MED ORDER — ACETAMINOPHEN 650 MG RE SUPP: 650.0000 mg | Freq: Four times a day (QID) | RECTAL | Status: DC | PRN

## 2020-02-07 MED ORDER — ONDANSETRON HCL 4 MG PO TABS: 4.0000 mg | ORAL_TABLET | Freq: Four times a day (QID) | ORAL | Status: DC | PRN

## 2020-02-07 MED ORDER — SODIUM CHLORIDE 0.9 % IV BOLUS
1000.0000 mL | Freq: Once | INTRAVENOUS | Status: AC
  Administered 2020-02-07: 1000 mL via INTRAVENOUS

## 2020-02-07 MED ORDER — SODIUM CHLORIDE 0.9% FLUSH
3.0000 mL | Freq: Two times a day (BID) | INTRAVENOUS | Status: DC
  Administered 2020-02-07 – 2020-02-09 (×3): 3 mL via INTRAVENOUS

## 2020-02-07 MED ORDER — SODIUM CHLORIDE 0.9 % IV BOLUS
500.0000 mL | Freq: Once | INTRAVENOUS | Status: AC
  Administered 2020-02-07: 500 mL via INTRAVENOUS

## 2020-02-07 MED ORDER — LEVOTHYROXINE SODIUM 50 MCG PO TABS
50.0000 ug | ORAL_TABLET | Freq: Every day | ORAL | Status: DC
  Administered 2020-02-08: 50 ug via ORAL
  Filled 2020-02-07 (×2): qty 1

## 2020-02-07 MED ORDER — MEMANTINE HCL 10 MG PO TABS
10.0000 mg | ORAL_TABLET | Freq: Two times a day (BID) | ORAL | Status: DC
  Administered 2020-02-07 – 2020-02-09 (×4): 10 mg via ORAL
  Filled 2020-02-07 (×5): qty 1

## 2020-02-07 MED ORDER — ACETAMINOPHEN 325 MG PO TABS
650.0000 mg | ORAL_TABLET | Freq: Once | ORAL | Status: AC
  Administered 2020-02-07: 650 mg via ORAL
  Filled 2020-02-07: qty 2

## 2020-02-07 MED ORDER — LORATADINE 10 MG PO TABS
10.0000 mg | ORAL_TABLET | Freq: Every day | ORAL | Status: DC
  Administered 2020-02-07 – 2020-02-09 (×3): 10 mg via ORAL
  Filled 2020-02-07 (×3): qty 1

## 2020-02-07 MED ORDER — BRIMONIDINE TARTRATE 0.15 % OP SOLN
1.0000 [drp] | Freq: Three times a day (TID) | OPHTHALMIC | Status: DC
  Administered 2020-02-07 – 2020-02-09 (×5): 1 [drp] via OPHTHALMIC
  Filled 2020-02-07: qty 5

## 2020-02-07 MED ORDER — SODIUM CHLORIDE 0.9 % IV SOLN: INTRAVENOUS | Status: DC

## 2020-02-07 MED ORDER — SERTRALINE HCL 100 MG PO TABS
100.0000 mg | ORAL_TABLET | Freq: Every day | ORAL | Status: DC
  Administered 2020-02-08 – 2020-02-09 (×2): 100 mg via ORAL
  Filled 2020-02-07 (×2): qty 1

## 2020-02-07 MED ORDER — ONDANSETRON HCL 4 MG/2ML IJ SOLN: 4.0000 mg | Freq: Four times a day (QID) | INTRAMUSCULAR | Status: DC | PRN

## 2020-02-07 MED ORDER — ENOXAPARIN SODIUM 40 MG/0.4ML ~~LOC~~ SOLN
40.0000 mg | SUBCUTANEOUS | Status: DC
  Administered 2020-02-07 – 2020-02-08 (×2): 40 mg via SUBCUTANEOUS
  Filled 2020-02-07 (×2): qty 0.4

## 2020-02-07 MED ORDER — MIDODRINE HCL 5 MG PO TABS: 2.5000 mg | ORAL_TABLET | Freq: Every day | ORAL | Status: DC

## 2020-02-07 MED ORDER — ASPIRIN 81 MG PO CHEW
81.0000 mg | CHEWABLE_TABLET | Freq: Every day | ORAL | Status: DC
  Administered 2020-02-07 – 2020-02-09 (×3): 81 mg via ORAL
  Filled 2020-02-07 (×3): qty 1

## 2020-02-07 MED ORDER — GABAPENTIN 300 MG PO CAPS
300.0000 mg | ORAL_CAPSULE | Freq: Two times a day (BID) | ORAL | Status: DC
  Administered 2020-02-07 – 2020-02-09 (×4): 300 mg via ORAL
  Filled 2020-02-07 (×4): qty 1

## 2020-02-07 MED ORDER — PRAVASTATIN SODIUM 10 MG PO TABS
20.0000 mg | ORAL_TABLET | Freq: Every day | ORAL | Status: DC
  Administered 2020-02-07 – 2020-02-09 (×3): 20 mg via ORAL
  Filled 2020-02-07 (×3): qty 2

## 2020-02-07 MED ORDER — DONEPEZIL HCL 10 MG PO TABS
10.0000 mg | ORAL_TABLET | Freq: Every day | ORAL | Status: DC
  Administered 2020-02-08: 10 mg via ORAL
  Filled 2020-02-07 (×2): qty 1

## 2020-02-07 MED ORDER — SODIUM CHLORIDE 0.9 % IV SOLN
1.0000 g | INTRAVENOUS | Status: DC
  Administered 2020-02-07 – 2020-02-08 (×2): 1 g via INTRAVENOUS
  Filled 2020-02-07: qty 10
  Filled 2020-02-07 (×2): qty 1

## 2020-02-07 MED ORDER — ACETAMINOPHEN 325 MG PO TABS
650.0000 mg | ORAL_TABLET | Freq: Four times a day (QID) | ORAL | Status: DC | PRN
  Administered 2020-02-08: 650 mg via ORAL
  Filled 2020-02-07 (×2): qty 2

## 2020-02-07 NOTE — Telephone Encounter (Signed)
Caller: [name] (care connection) Call back phone: [phone]  Calling to see if you have received results from the urine sample collected on 01/20/20 it was dropped at [facility].   Please advise.

## 2020-02-07 NOTE — ED Provider Notes (Signed)
[facility] Provider Note   CSN: [id number] Arrival date & time: 02/07/20  1205     History Chief Complaint  Patient presents with  . Loss of Consciousness    Michelle [name] is a 74 y.o. female with a history of dementia, GERD, hypertension, hyperlipidemia, IBS, thyroid disease, and prior syncope who presents to the emergency department via EMS for recurrent syncope today.  Per patient's husband the patient was on the commode when he heard a loud noise. He found her on the floor and she had told him that she felt weak and was lightheaded.  He was able to help her back to bed where he checked her blood pressure and noted it to be 70s over 40s.  She states she still needs to urinate therefore he tried to help her back to the bathroom with her walker, she continued to feel lightheaded and had another near syncopal episode.  He ultimately was able to pick her up and carry her to the bathroom where she felt like she was going to pass out again therefore he ultimately decided to call 911.  Patient does not recall the specifics of these events, she has a history of dementia and is at her mental status baseline per her husband.  States that she has a mild headache but does not think that she hit her head.  She denies any associated chest pain or trouble breathing.  Level 5 caveat applies secondary to dementia.  Per patient's husband she has not had any recent medication changes, she does take Midodrine.  Patient's husband also mentions that she has had passing out episodes previously, one time it was associated with a UTI, she also frequently forgets to eat and drink with her dementia.  Per EMS patient had positive orthostatic vitals, her blood pressure sitting was [id number] systolic and dropped to 70 systolic with standing.  HPI     Past Medical History:  Diagnosis Date  . Adenomatous colon polyp   . Allergy   . Dementia ([place])   . GERD (gastroesophageal reflux  disease)   . Glaucoma   . Hx of hearing loss    bilateral hearing aids  . Hyperlipidemia   . Hypertension   . IBS (irritable bowel syndrome)   . Low back pain   . Thyroid disease     Patient Active Problem List   Diagnosis Date Noted  . Early onset Alzheimer's dementia without behavioral disturbance ([place]) 11/10/2019  . Idiopathic hypotension 10/12/2019  . Syncope 10/12/2019  . Pain in joint of right shoulder 09/20/2019  . Impingement syndrome of right shoulder region 09/20/2019  . Viral upper respiratory tract infection 08/24/2019  . Pain of joint of left ankle and foot 03/18/2019  . Peroneal mononeuropathy, left 12/15/2016  . Preventative health care 10/31/2016  . Left foot drop 10/14/2016  . Weight loss 10/14/2016  . Tension-type headache, not intractable 09/17/2016  . Meningioma ([place]) 06/05/2016  . Mild cognitive impairment 04/02/2016  . Depression 04/02/2016  . Memory loss 03/21/2016  . Asymptomatic postmenopausal status 07/26/2009  . INTESTINAL GAS 09/18/2008  . ONYCHOMYCOSIS, TOENAILS 08/14/2008  . GLAUCOMA 07/24/2008  . Hypertensive disorder 07/24/2008  . MYALGIA 02/29/2008  . COUGH 07/26/2007  . ADENOMATOUS COLONIC POLYP 05/20/2007  . DIVERTICULOSIS, COLON 05/20/2007  . Hypothyroidism 04/07/2007  . Hypercholesterolemia 04/07/2007  . GERD 04/07/2007  . IBS 04/07/2007    Past Surgical History:  Procedure Laterality Date  . ABDOMINAL HYSTERECTOMY  1998  .  CATARACT EXTRACTION, BILATERAL Bilateral 04/2015  . CHOLECYSTECTOMY  1998  . COLONOSCOPY  12/10/2016  . [place] / 2013   skin graft on gums  . TUBAL LIGATION  1977     OB History   No obstetric history on file.     Family History  Problem Relation Age of Onset  . Macular degeneration Father   . Prostate cancer Father   . Hypertension Father   . Cancer Father        prostate  . Glaucoma Mother   . Emphysema Mother   . Hypertension Mother   . Other Brother        Brain tumor  .  Cancer Brother 26       brain tumor  . Breast cancer Maternal Grandmother   . Prostate cancer Brother   . Colon cancer Neg Hx   . Colon polyps Neg Hx   . Esophageal cancer Neg Hx   . Rectal cancer Neg Hx   . Stomach cancer Neg Hx     Social History   Tobacco Use  . Smoking status: Never Smoker  . Smokeless tobacco: Never Used  Substance Use Topics  . Alcohol use: No  . Drug use: No    Home Medications Prior to Admission medications   Medication Sig Start Date End Date Taking? Authorizing Provider  ALPHAGAN P 0.1 % SOLN  02/27/14   [provider]  AMBULATORY NON FORMULARY MEDICATION IBgard: Takes prn    [provider]  aspirin 81 MG tablet Take 81 mg by mouth daily.     [provider]  CALCIUM PO Take by mouth.    [provider]  donepezil (ARICEPT) 10 MG tablet Take 1 tablet daily 12/05/19   Cameron Sprang, MD  fluticasone [facility]) 50 MCG/ACT nasal spray Place 2 sprays into both nostrils daily. 07/10/17   Collene Gobble, MD  gabapentin (NEURONTIN) 300 MG capsule Take 1 cap twice a day 12/05/19   Cameron Sprang, MD  Lactobacillus-Inulin (CULTURELLE DIGESTIVE DAILY PO) Take by mouth.    [provider]  levothyroxine (SYNTHROID) 50 MCG tablet TAKE 1 TABLET BY MOUTH EVERY DAY 09/30/19   Carollee Herter, Alferd Apa, DO  loratadine (CLARITIN) 10 MG tablet Take 10 mg by mouth daily.    [provider]  memantine (NAMENDA) 10 MG tablet Take 1 tablet (10 mg total) by mouth 2 (two) times daily. 12/05/19   Cameron Sprang, MD  midodrine (PROAMATINE) 2.5 MG tablet Take 1 tablet (2.5 mg total) by mouth daily. 12/01/19   Ann Held, DO  Multiple Vitamin (MULTIVITAMIN) tablet Take 1 tablet by mouth daily.    [provider]  Na Sulfate-K Sulfate-Mg Sulf 17.5-3.13-1.6 GM/177ML SOLN Suprep (no substitutions)-TAKE AS DIRECTED. 02/03/20   Irene Shipper, MD  pravastatin (PRAVACHOL) 20 MG tablet TAKE 1 TABLET BY MOUTH EVERY DAY  05/27/19   Carollee Herter, Alferd Apa, DO  sertraline (ZOLOFT) 100 MG tablet Take 1 tablet (100 mg total) by mouth daily. 12/05/19   Cameron Sprang, MD  VITAMIN D PO Take by mouth.    [provider]    Allergies    No known allergies  Review of Systems   Review of Systems  Unable to perform ROS: Dementia    Physical Exam Updated Vital Signs BP (!) 144/60 (BP Location: Right Arm)   Pulse 61   Temp 97.8 F (36.6 C) (Oral)   Resp 20  SpO2 100%   Physical Exam Vitals and nursing note reviewed.  Constitutional:      General: She is not in acute distress.    Appearance: Normal appearance. She is not ill-appearing or toxic-appearing.  HENT:     Head: Normocephalic and atraumatic.     Comments: NO raccoon eyes or battle sign.     Ears:     Comments: No hemotympanum.    Mouth/Throat:     Mouth: Mucous membranes are dry.     Comments: Uvula midline. Eyes:     Extraocular Movements: Extraocular movements intact.     Pupils: Pupils are equal, round, and reactive to light.  Cardiovascular:     Rate and Rhythm: Normal rate and regular rhythm.  Pulmonary:     Effort: Pulmonary effort is normal. No respiratory distress.     Breath sounds: Normal breath sounds. No wheezing, rhonchi or rales.  Chest:     Chest wall: No tenderness.  Abdominal:     General: There is no distension.     Palpations: Abdomen is soft.     Tenderness: There is no abdominal tenderness. There is no guarding or rebound.  Musculoskeletal:     Cervical back: Normal range of motion and neck supple. No rigidity or tenderness.     Comments: Back: No midline spinal tenderness Upper/lower extremities: Intact active range of motion without focal bony tenderness.  Lymphadenopathy:     Cervical: No cervical adenopathy.  Skin:    General: Skin is warm and dry.  Neurological:     Mental Status: She is alert.     Comments: Alert.  Clear speech.  Some confusion noted, patient states that she is at her mental  status baseline.  She has symmetric strength and sensation to bilateral upper and lower extremities.    ED Results / Procedures / Treatments   Labs (all labs ordered are listed, but only abnormal results are displayed) Labs Reviewed  CBC WITH DIFFERENTIAL/PLATELET - Abnormal; Notable for the following components:      Result Value   RBC 3.44 (*)    Hemoglobin 10.2 (*)    HCT 31.3 (*)    All other components within normal limits  COMPREHENSIVE METABOLIC PANEL - Abnormal; Notable for the following components:   Calcium 8.4 (*)    Total Protein 5.4 (*)    Albumin 3.2 (*)    All other components within normal limits  URINALYSIS, ROUTINE W REFLEX MICROSCOPIC - Abnormal; Notable for the following components:   Color, Urine COLORLESS (*)    Specific Gravity, Urine 1.004 (*)    Leukocytes,Ua MODERATE (*)    All other components within normal limits  URINE CULTURE  CBG MONITORING, ED  POC OCCULT BLOOD, ED    EKG EKG Interpretation  Date/Time:  Tuesday February 07 2020 12:35:40 EDT Ventricular Rate:  60 PR Interval:    QRS Duration: 104 QT Interval:  433 QTC Calculation: 433 R Axis:   54 Text Interpretation: Sinus rhythm Low voltage, precordial leads Confirmed by Quintella Reichert [phone]) on 02/07/2020 3:10:01 PM   Radiology No results found.  Procedures Procedures (including critical care time)  Medications Ordered in ED Medications - No data to display  ED Course  I have reviewed the triage vital signs and the nursing notes.  Pertinent labs & imaging results that were available during my care of the patient were reviewed by me and considered in my medical decision making (see chart for details).    MDM Rules/Calculators/A&P  Patient presents to the ED for evaluation of syncope. Nontoxic, vitals at rest without significant abnormality.  She is complaining of a mild headache, no signs of head trauma, no focal neurologic deficits.  [place] membranes are  dry.  Additional history obtained:  Additional history obtained from patient's husband at bedside. Previous records obtained and reviewed.  EKG: Sinus rhythm, no STEMI, no significant arrhythmia. Lab Tests:  I Ordered, reviewed, and interpreted labs, which included:  CBG: Within normal limits CBC: Mildly worsening anemia, fecal occult checked with chaperone at bedside, DRE with soft brown stool, no melena, no hematochezia, fecal occult negative. BMP: Mild hypocalcemia and hypoalbuminemia.  No significant electrolyte derangement.  Renal function preserved. Urinalysis: Moderate leukocytes, not overly concerning for infection at this time, will send for culture.  ED Course:   Orthostatic VS for the past 24 hrs:  BP- Lying Pulse- Lying BP- Sitting Pulse- Sitting BP- Standing at 0 minutes Pulse- Standing at 0 minutes  02/07/20 1248 121/60 60 138/70 62 99/57 65    Orthostatic vitals are positive, patient has dry mucous membranes, she reportedly forgets to eat and drink sometimes for her husband, will give fluids and reassess.  Work-up overall reassuring.  Mildly worsening anemia, however fecal occult negative and no melena or hematochezia noted on exam.  She has no significant electrolyte derangement.  Her cardiac monitor has been reviewed and is fairly unremarkable.  She continues to complain of a mild headache, her husband is concerned about this, will obtain head CT without contrast.  Plan for ambulation following fluids to ensure no recurrence of lightheadedness or syncope.  15:30: Patient care signed out to Suella Broad, PA-C at change of shift pending CT head and ambulation following fluids.  Patient and his wife have been updated on results thus far and are in agreement with plan of care.  Portions of this note were generated with [employer]. Dictation errors may occur despite best attempts at proofreading.  Final Clinical Impression(s) / ED Diagnoses Final diagnoses:   Syncope, unspecified syncope type  Orthostatic hypotension  Anemia, unspecified type    Rx / DC Orders ED Discharge Orders    None       Amaryllis Dyke, PA-C 02/07/20 1604    Maudie Flakes, MD 02/13/20 [phone]

## 2020-02-07 NOTE — H&P (Addendum)
[facility] History and Physical  Michelle [name] [id number] DOB: 1946-05-08 DOA: 02/07/2020   PCP: Ann Held, DO  Specialists: None  Chief Complaint: Fall in the restroom  HPI: Michelle [name] is a 74 y.o. female with a past medical history of Alzheimer's dementia, hypothyroidism, hyperlipidemia who lives with her husband.  She was in her usual state of health earlier today when she was in the restroom.  And then she fell down onto the floor.  She does not remember the circumstances of her fall.  Due to her dementia patient is a poor historian.  Patient's husband came into the bathroom and he found her on the floor of the bathroom.  Apparently she told him that she felt weak and was lightheaded.  It remains unclear if she truly lost consciousness or not.  Patient's husband has a blood pressure machine at home and found her BP to be in 70's over 40's. Patient mentions that over the last 2 days she has had some burning sensation with urination.  Patient's husband informs me that she has had to 3 bouts of UTI in the last few months.  Patient denies any chest pain or shortness of breath.  No recent travel.  Denies any seizure type activity.  No fever or chills recently.  No nausea vomiting however she has had more loose stools than normal.  Apparently has a history of IBS as well.  According to the husband patient had a syncopal episode back in January but at that time she was not brought into the hospital.  She was hydrated at home and she did not have any episodes till now.  No recent changes to her medications.  She takes midodrine for low blood pressure issues.  In the emergency department patient was hydrated.  She was found to be orthostatic.  She felt weak when she was mobilized.  She will need to be brought in for further work-up and for observation.  Home Medications: Prior to Admission medications   Medication Sig Start Date End Date Taking? Authorizing Provider    ALPHAGAN P 0.1 % SOLN Place 1 drop into both eyes in the morning and at bedtime.  02/27/14  Yes [provider]  AMBULATORY NON FORMULARY MEDICATION Take 1 tablet by mouth daily. IBgard: Takes prn    Yes [provider]  aspirin 81 MG tablet Take 81 mg by mouth daily.    Yes [provider]  CALCIUM PO Take 1 tablet by mouth every evening.    Yes [provider]  Cranberry 1000 MG CAPS Take 1,000 mg by mouth daily.   Yes [provider]  donepezil (ARICEPT) 10 MG tablet Take 1 tablet daily Patient taking differently: Take 10 mg by mouth at bedtime.  12/05/19  Yes Cameron Sprang, MD  fluticasone [facility]) 50 MCG/ACT nasal spray Place 2 sprays into both nostrils daily. Patient taking differently: Place 2 sprays into both nostrils daily as needed for allergies.  07/10/17  Yes Collene Gobble, MD  gabapentin (NEURONTIN) 300 MG capsule Take 1 cap twice a day Patient taking differently: Take 300 mg by mouth 2 (two) times daily. Take 1 cap twice a day 12/05/19  Yes Cameron Sprang, MD  Lactobacillus-Inulin (CULTURELLE DIGESTIVE DAILY PO) Take 1 tablet by mouth daily.    Yes [provider]  levothyroxine (SYNTHROID) 50 MCG tablet TAKE 1 TABLET BY MOUTH EVERY DAY Patient taking differently: Take 50 mcg by mouth daily before breakfast.  09/30/19  Yes Roma Schanz R, DO  loratadine (CLARITIN) 10 MG tablet Take 10 mg by mouth daily.   Yes [provider]  memantine (NAMENDA) 10 MG tablet Take 1 tablet (10 mg total) by mouth 2 (two) times daily. 12/05/19  Yes Cameron Sprang, MD  midodrine (PROAMATINE) 2.5 MG tablet Take 1 tablet (2.5 mg total) by mouth daily. 12/01/19  Yes Ann Held, DO  Multiple Vitamin (MULTIVITAMIN) tablet Take 1 tablet by mouth daily.   Yes [provider]  pravastatin (PRAVACHOL) 20 MG tablet TAKE 1 TABLET BY MOUTH EVERY DAY Patient taking differently: Take 20 mg by mouth daily.  05/27/19  Yes Roma Schanz R, DO  sertraline (ZOLOFT) 100 MG tablet Take 1 tablet (100 mg total) by mouth daily. 12/05/19  Yes Cameron Sprang, MD  VITAMIN D PO Take 1,000 Units by mouth daily.    Yes [provider]  Na Sulfate-K Sulfate-Mg Sulf 17.5-3.13-1.6 GM/177ML SOLN Suprep (no substitutions)-TAKE AS DIRECTED. 02/03/20   Irene Shipper, MD    Allergies: No Known Allergies  Past Medical History: Past Medical History:  Diagnosis Date  . Adenomatous colon polyp   . Allergy   . Dementia ([place])   . GERD (gastroesophageal reflux disease)   . Glaucoma   . Hx of hearing loss    bilateral hearing aids  . Hyperlipidemia   . Hypertension   . IBS (irritable bowel syndrome)   . Low back pain   . Thyroid disease     Past Surgical History:  Procedure Laterality Date  . ABDOMINAL HYSTERECTOMY  1998  . CATARACT EXTRACTION, BILATERAL Bilateral 04/2015  . CHOLECYSTECTOMY  1998  . COLONOSCOPY  12/10/2016  . [place] / 2013   skin graft on gums  . TUBAL LIGATION  1977    Social History: Lives with her husband.  She is a DNR.  No history of smoking alcohol use or illicit drug use.  Family History:  Family History  Problem Relation Age of Onset  . Macular degeneration Father   . Prostate cancer Father   . Hypertension Father   . Cancer Father        prostate  . Glaucoma Mother   . Emphysema Mother   . Hypertension Mother   . Other Brother        Brain tumor  . Cancer Brother 2       brain tumor  . Breast cancer Maternal Grandmother   . Prostate cancer Brother   . Colon cancer Neg Hx   . Colon polyps Neg Hx   . Esophageal cancer Neg Hx   . Rectal cancer Neg Hx   . Stomach cancer Neg Hx      Review of Systems - History obtained from the patient General ROS: positive for  - fatigue Psychological ROS: negative Ophthalmic ROS: negative ENT ROS: negative Allergy and Immunology ROS: negative Hematological and Lymphatic ROS: negative Endocrine ROS: negative Respiratory  ROS: no cough, shortness of breath, or wheezing Cardiovascular ROS: no chest pain or dyspnea on exertion Gastrointestinal ROS: no abdominal pain, change in bowel habits, or black or bloody stools Genito-Urinary ROS: As in HPI Musculoskeletal ROS: negative Neurological ROS: no TIA or stroke symptoms Dermatological ROS: negative  Physical Examination  Vitals:   02/07/20 1700 02/07/20 1715 02/07/20 1745 02/07/20 1815  BP: 117/71 139/63 (!) 147/58 (!) 151/67  Pulse: (!) 59 (!) 59 (!) 58 61  Resp: 14 13  16 13  Temp:      TempSrc:      SpO2: 100% 100% 100% 100%    BP (!) 151/67   Pulse 61   Temp 97.8 F (36.6 C) (Oral)   Resp 13   SpO2 100%   General appearance: alert, cooperative, appears stated age, distracted and no distress Head: Normocephalic, without obvious abnormality, atraumatic Eyes: conjunctivae/corneas clear. PERRL, EOM's intact. Throat: Dry mucous membranes Neck: no adenopathy, no carotid bruit, no JVD, supple, symmetrical, trachea midline and thyroid not enlarged, symmetric, no tenderness/mass/nodules Resp: clear to auscultation bilaterally Cardio: regular rate and rhythm, S1, S2 normal, no murmur, click, rub or gallop GI: soft, non-tender; bowel sounds normal; no masses,  no organomegaly Extremities: extremities normal, atraumatic, no cyanosis or edema.  Able to lift both her legs off the bed.  No other restriction in range of motion noted in any of the joints. Pulses: 2+ and symmetric Skin: Skin color, texture, turgor normal. No rashes or lesions Lymph nodes: Cervical, supraclavicular, and axillary nodes normal. Neurologic: She is alert.  Oriented to person.  Cranial nerves II to XII intact.  Motor strength equal bilateral upper and lower extremities.   Labs on Admission: I have personally reviewed following labs and imaging studies  CBC: Recent Labs  Lab 02/07/20 1232  WBC 4.1  NEUTROABS 2.6  HGB 10.2*  HCT 31.3*  MCV 91.0  PLT [id number]   Basic Metabolic  Panel: Recent Labs  Lab 02/07/20 1232  NA 141  K 3.7  CL 106  CO2 28  GLUCOSE 94  BUN 11  CREATININE 0.75  CALCIUM 8.4*   GFR: Estimated Creatinine Clearance: 54.1 mL/min (by C-G formula based on SCr of 0.75 mg/dL). Liver Function Tests: Recent Labs  Lab 02/07/20 1232  AST 19  ALT 13  ALKPHOS 38  BILITOT 0.6  PROT 5.4*  ALBUMIN 3.2*   CBG: Recent Labs  Lab 02/07/20 1212  GLUCAP 92     Radiological Exams on Admission: CT Head Wo Contrast  Result Date: 02/07/2020 CLINICAL DATA:  Headache. Near and syncopal episodes. Orthostatic hypotension. EXAM: CT HEAD WITHOUT CONTRAST TECHNIQUE: Contiguous axial images were obtained from the base of the skull through the vertex without intravenous contrast. COMPARISON:  05/09/2017 head CT. FINDINGS: Brain: Dorsal right clival 2.0 cm extra-axial mass, characterized as meningioma on multiple prior brain MRI studies, not appreciably changed. No evidence of parenchymal hemorrhage or extra-axial fluid collection. No additional mass lesion, mass effect, or midline shift. No CT evidence of acute infarction. Nonspecific mild subcortical and periventricular white matter hypodensity, most in keeping with chronic small vessel ischemic change. Cerebral volume is age appropriate. No ventriculomegaly. Vascular: No acute abnormality. Skull: No evidence of calvarial fracture. Sinuses/Orbits: The visualized paranasal sinuses are essentially clear. No fluid levels. Stable small mucous retention cyst versus polyp in the medial right maxillary sinus. Other:  The mastoid air cells are unopacified. IMPRESSION: 1. No evidence of acute intracranial abnormality. No evidence of calvarial fracture. 2. 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 3. Mild chronic small vessel ischemic changes in the cerebral white matter. Electronically Signed   By: Ilona Sorrel M.D.   On: 02/07/2020 16:56    My interpretation of Electrocardiogram:  Sinus rhythm in the 60s.  Normal axis.  Intervals are normal.  No concerning ST or T wave changes.   Problem List  Principal Problem:   Near syncope Active Problems:   Hypothyroidism   Unspecified glaucoma  Early onset Alzheimer's dementia without behavioral disturbance ([place])   Acute lower UTI   Assessment: This is a 74 year old Caucasian female with past medical history as stated earlier who comes in after sustaining a fall in the bathroom.  No obvious injuries are noted.  It is unclear if she lost consciousness or not.  We will treat this as a syncopal episode.   Her event could have been due to UTI.  Husband also reported some loose stools over the weekend which could have also contributed.  Plan:  1. Near syncope/orthostatic hypotension: Telemetry.  She has been given fluid boluses in the ED.  We will gently hydrate her for 12 hours.  Recheck orthostatic vital signs tomorrow morning.  Echocardiogram.  Chest x-ray.  Treat for UTI for now.  CT head does show a meningioma which is previously known.  No seizure activity has been mentioned.  EEG due to meningioma and dementia.  PT and OT evaluation.  2.  Acute lower UTI: She has had some dysuria.  Her UA is noted to be mildly abnormal.  She apparently has had multiple UTIs.  Last urine culture from April shows E. coli.  Sensitive to ceftriaxone. Based on culture report from April we will place on ceftriaxone for now.  Urine culture has been ordered.  If work-up is completed and she is able to go home tomorrow she can be switched over to oral antibiotics.  3.  History of Alzheimer's dementia: Noted to be on Aricept and Namenda.  She is noted to be mildly bradycardic.  So we will hold the Aricept for now.    4.  History of hypothyroidism: Continue with levothyroxine.  5.  Normocytic anemia: Previous hemoglobin from September 2019 was 11.5.  Stool for occult blood is negative.  Will recommend outpatient work-up for anemia.  Asymptomatic  screening COVID-19 test has been ordered by the ED provider and is pending.  DVT Prophylaxis: Lovenox Code Status: DNR.  This was discussed with patient's husband Family Communication: Discussed with patient husband Consults called: None  Status is: Observation  The patient remains OBS appropriate and will d/c before 2 midnights.  Dispo: The patient is from: Home              Anticipated d/c is to: Home              Anticipated d/c date is: 1 day              Patient currently is not medically stable to d/c.    Severity of Illness: The appropriate patient status for this patient is OBSERVATION. Observation status is judged to be reasonable and necessary in order to provide the required intensity of service to ensure the patient's safety. The patient's presenting symptoms, physical exam findings, and initial radiographic and laboratory data in the context of their medical condition is felt to place them at decreased risk for further clinical deterioration. Furthermore, it is anticipated that the patient will be medically stable for discharge from the hospital within 2 midnights of admission. The following factors support the patient status of observation.   " The patient's presenting symptoms include dizziness, weakness. " The physical exam findings include dehydration. " The initial radiographic and laboratory data are concerning for UTI.    Further management decisions will depend on results of further testing and patient's response to treatment.   Geovonni Meyerhoff [employer]  [facility] operational officer on [email or web address]  02/07/2020, 6:27 PM

## 2020-02-07 NOTE — ED Triage Notes (Addendum)
Pt arrives to ED via [facility] from home where she lives. ems reports feeling weak all day and having multiple near syncopal episodes and one full syncope in restroom. Noted to have orthostatic hypotension with ems. Pt was given 750CC NS. Pt had sitting bp of 140s and standing in 70's. Pt is alert and oriented to person and place confused of time/situation and what bought her here today.   Hx of dementia

## 2020-02-07 NOTE — Discharge Instructions (Addendum)
Instruct patients to: Be careful on awakening  Elevate the head of the bed (reducing nocturia)  Drink two cups of cold water 30 minutes before arising  Shift from supine to an erect position in gradual stages.  Your physician has determined that you have orthostatic hypotension. This means that your blood pressure drops when you stand up, making you feel dizzy or perhaps even pass out. The following may help. A: Abdominal compression Wear an abdominal binder when out of bed. B: Bolus of water On bad days, drink two 8-ounce glasses of cold water prior to prolonged standing. B (continued): Bed up Sleep with the head of the bed elevated 4 inches. C: Countermaneuvers Contract the muscles below your waist for about half a minute at a time to raise your blood pressure during prolonged standing or when you become symptomatic. D: Drugs Drugs such as midodrine (ProAmatine), pyridostigmine (Mestinon), and fludrocortisone (Florinef) can be used to raise your blood pressure. Recognize that some drugs you take can lower blood pressure. E: Education Recognize symptoms that indicate your standing blood pressure is falling. Recognize the conditions that lower blood pressure, such as a heavy meal, positional changes, heat, exercise, or a hot bath. Learn the things you can do to raise your blood pressure. E (continued): Exercise Avoid inactivity and consider a gentle exercise program. F: Fluids and salt You need plenty of salt and fluids  Orthostatic Hypotension Blood pressure is a measurement of how strongly, or weakly, your blood is pressing against the walls of your arteries. Orthostatic hypotension is a sudden drop in blood pressure that happens when you quickly change positions, such as when you get up from sitting or lying down. Arteries are blood vessels that carry blood from your heart throughout your body. When blood pressure is too low, you may not get enough blood to your brain or to the  rest of your organs. This can cause weakness, light-headedness, rapid heartbeat, and fainting. This can last for just a few seconds or for up to a few minutes. Orthostatic hypotension is usually not a serious problem. However, if it happens frequently or gets worse, it may be a sign of something more serious. What are the causes? This condition may be caused by:  Sudden changes in posture, such as standing up quickly after you have been sitting or lying down.  Blood loss.  Loss of body fluids (dehydration).  Heart problems.  Hormone (endocrine) problems.  Pregnancy.  Severe infection.  Lack of certain nutrients.  Severe allergic reactions (anaphylaxis).  Certain medicines, such as blood pressure medicine or medicines that make the body lose excess fluids (diuretics). Sometimes, this condition can be caused by not taking medicine as directed, such as taking too much of a certain medicine. What increases the risk? The following factors may make you more likely to develop this condition:  Age. Risk increases as you get older.  Conditions that affect the heart or the central nervous system.  Taking certain medicines, such as blood pressure medicine or diuretics.  Being pregnant. What are the signs or symptoms? Symptoms of this condition may include:  Weakness.  Light-headedness.  Dizziness.  Blurred vision.  Fatigue.  Rapid heartbeat.  Fainting, in severe cases. How is this diagnosed? This condition is diagnosed based on:  Your medical history.  Your symptoms.  Your blood pressure measurement. Your health care provider will check your blood pressure when you are: ? Lying down. ? Sitting. ? Standing. A blood pressure reading is recorded as  two numbers, such as "120 over 80" (or 120/80). The first ("top") number is called the systolic pressure. It is a measure of the pressure in your arteries as your heart beats. The second ("bottom") number is called the  diastolic pressure. It is a measure of the pressure in your arteries when your heart relaxes between beats. Blood pressure is measured in a unit called mm Hg. Healthy blood pressure for most adults is 120/80. If your blood pressure is below 90/60, you may be diagnosed with hypotension. Other information or tests that may be used to diagnose orthostatic hypotension include:  Your other vital signs, such as your heart rate and temperature.  Blood tests.  Tilt table test. For this test, you will be safely secured to a table that moves you from a lying position to an upright position. Your heart rhythm and blood pressure will be monitored during the test. How is this treated? This condition may be treated by:  Changing your diet. This may involve eating more salt (sodium) or drinking more water.  Taking medicines to raise your blood pressure.  Changing the dosage of certain medicines you are taking that might be lowering your blood pressure.  Wearing compression stockings. These stockings help to prevent blood clots and reduce swelling in your legs. In some cases, you may need to go to the hospital for:  Fluid replacement. This means you will receive fluids through an IV.  Blood replacement. This means you will receive donated blood through an IV (transfusion).  Treating an infection or heart problems, if this applies.  Monitoring. You may need to be monitored while medicines that you are taking wear off. Follow these instructions at home: Eating and drinking   Drink enough fluid to keep your urine pale yellow.  Eat a healthy diet, and follow instructions from your health care provider about eating or drinking restrictions. A healthy diet includes: ? Fresh fruits and vegetables. ? Whole grains. ? Lean meats. ? Low-fat dairy products.  Eat extra salt only as directed. Do not add extra salt to your diet unless your health care provider told you to do that.  Eat frequent, small  meals.  Avoid standing up suddenly after eating. Medicines  Take over-the-counter and prescription medicines only as told by your health care provider. ? Follow instructions from your health care provider about changing the dosage of your current medicines, if this applies. ? Do not stop or adjust any of your medicines on your own. General instructions   Wear compression stockings as told by your health care provider.  Get up slowly from lying down or sitting positions. This gives your blood pressure a chance to adjust.  Avoid hot showers and excessive heat as directed by your health care provider.  Return to your normal activities as told by your health care provider. Ask your health care provider what activities are safe for you.  Do not use any products that contain nicotine or tobacco, such as cigarettes, e-cigarettes, and chewing tobacco. If you need help quitting, ask your health care provider.  Keep all follow-up visits as told by your health care provider. This is important. Contact a health care provider if you:  Vomit.  Have diarrhea.  Have a fever for more than 2-3 days.  Feel more thirsty than usual.  Feel weak and tired. Get help right away if you:  Have chest pain.  Have a fast or irregular heartbeat.  Develop numbness in any part of your body.  Cannot move your arms or your legs.  Have trouble speaking.  Become sweaty or feel light-headed.  Faint.  Feel short of breath.  Have trouble staying awake.  Feel confused. Summary  Orthostatic hypotension is a sudden drop in blood pressure that happens when you quickly change positions.  Orthostatic hypotension is usually not a serious problem.  It is diagnosed by having your blood pressure taken lying down, sitting, and then standing.  It may be treated by changing your diet or adjusting your medicines. This information is not intended to replace advice given to you by your health care provider.  Make sure you discuss any questions you have with your health care provider. Document Revised: 02/18/2018 Document Reviewed: 02/18/2018 Elsevier Patient Education  [place].

## 2020-02-07 NOTE — ED Notes (Signed)
ED Provider at bedside. 

## 2020-02-07 NOTE — ED Provider Notes (Signed)
74yo female with orthostatic hypotension. Waiting for head CT, can ambulate after fluids and be dc. Mild headache, similar to prior, no known head injury, no c-spine injury. History of brain mass (meningioma), not undergoing treatment.  Physical Exam  BP (!) 151/67   Pulse 61   Temp 97.8 F (36.6 C) (Oral)   Resp 13   SpO2 100%   Physical Exam  ED Course/Procedures     Procedures  MDM  CT head unchanged from prior imaging. On recheck, patient remains symptomatic and orthostatic with BP drop of 20 points and feeling dizzy/weak. Patient's husband does not feel comfortable taking patient home due to fall risk. Case discussed with Dr. [place] Pink with hospitalist service who will consult for admission.        Tacy Learn, PA-C 02/07/20 1826    Quintella Reichert, MD 02/08/20 1118

## 2020-02-07 NOTE — ED Notes (Signed)
Attempted to call report to [facility], they would not receive report.

## 2020-02-08 ENCOUNTER — Observation Stay (HOSPITAL_COMMUNITY): Payer: Medicare HMO

## 2020-02-08 ENCOUNTER — Encounter (HOSPITAL_COMMUNITY): Payer: Self-pay | Admitting: Internal Medicine

## 2020-02-08 ENCOUNTER — Other Ambulatory Visit: Payer: Self-pay

## 2020-02-08 DIAGNOSIS — D649 Anemia, unspecified: Secondary | ICD-10-CM | POA: Diagnosis present

## 2020-02-08 DIAGNOSIS — Z8249 Family history of ischemic heart disease and other diseases of the circulatory system: Secondary | ICD-10-CM | POA: Diagnosis not present

## 2020-02-08 DIAGNOSIS — R55 Syncope and collapse: Secondary | ICD-10-CM | POA: Diagnosis not present

## 2020-02-08 DIAGNOSIS — Z825 Family history of asthma and other chronic lower respiratory diseases: Secondary | ICD-10-CM | POA: Diagnosis not present

## 2020-02-08 DIAGNOSIS — Z8042 Family history of malignant neoplasm of prostate: Secondary | ICD-10-CM | POA: Diagnosis not present

## 2020-02-08 DIAGNOSIS — I951 Orthostatic hypotension: Secondary | ICD-10-CM | POA: Diagnosis not present

## 2020-02-08 DIAGNOSIS — Z803 Family history of malignant neoplasm of breast: Secondary | ICD-10-CM | POA: Diagnosis not present

## 2020-02-08 DIAGNOSIS — I10 Essential (primary) hypertension: Secondary | ICD-10-CM | POA: Diagnosis not present

## 2020-02-08 DIAGNOSIS — D329 Benign neoplasm of meninges, unspecified: Secondary | ICD-10-CM | POA: Diagnosis present

## 2020-02-08 DIAGNOSIS — G300 Alzheimer's disease with early onset: Secondary | ICD-10-CM | POA: Diagnosis not present

## 2020-02-08 DIAGNOSIS — Z87440 Personal history of urinary (tract) infections: Secondary | ICD-10-CM | POA: Diagnosis not present

## 2020-02-08 DIAGNOSIS — Z66 Do not resuscitate: Secondary | ICD-10-CM | POA: Diagnosis not present

## 2020-02-08 DIAGNOSIS — E079 Disorder of thyroid, unspecified: Secondary | ICD-10-CM | POA: Diagnosis not present

## 2020-02-08 DIAGNOSIS — I361 Nonrheumatic tricuspid (valve) insufficiency: Secondary | ICD-10-CM

## 2020-02-08 DIAGNOSIS — Z9049 Acquired absence of other specified parts of digestive tract: Secondary | ICD-10-CM | POA: Diagnosis not present

## 2020-02-08 DIAGNOSIS — Z20822 Contact with and (suspected) exposure to covid-19: Secondary | ICD-10-CM | POA: Diagnosis not present

## 2020-02-08 DIAGNOSIS — Z90710 Acquired absence of both cervix and uterus: Secondary | ICD-10-CM | POA: Diagnosis not present

## 2020-02-08 DIAGNOSIS — K219 Gastro-esophageal reflux disease without esophagitis: Secondary | ICD-10-CM | POA: Diagnosis not present

## 2020-02-08 DIAGNOSIS — K589 Irritable bowel syndrome without diarrhea: Secondary | ICD-10-CM | POA: Diagnosis not present

## 2020-02-08 DIAGNOSIS — N390 Urinary tract infection, site not specified: Secondary | ICD-10-CM | POA: Diagnosis not present

## 2020-02-08 DIAGNOSIS — E785 Hyperlipidemia, unspecified: Secondary | ICD-10-CM | POA: Diagnosis not present

## 2020-02-08 DIAGNOSIS — Z83511 Family history of glaucoma: Secondary | ICD-10-CM | POA: Diagnosis not present

## 2020-02-08 DIAGNOSIS — E039 Hypothyroidism, unspecified: Secondary | ICD-10-CM | POA: Diagnosis present

## 2020-02-08 DIAGNOSIS — F0280 Dementia in other diseases classified elsewhere without behavioral disturbance: Secondary | ICD-10-CM | POA: Diagnosis present

## 2020-02-08 DIAGNOSIS — Z86010 Personal history of colonic polyps: Secondary | ICD-10-CM | POA: Diagnosis not present

## 2020-02-08 DIAGNOSIS — H409 Unspecified glaucoma: Secondary | ICD-10-CM | POA: Diagnosis present

## 2020-02-08 LAB — COMPREHENSIVE METABOLIC PANEL
ALT: 14 U/L (ref 0–44)
AST: 21 U/L (ref 15–41)
Albumin: 3.5 g/dL (ref 3.5–5.0)
Alkaline Phosphatase: 42 U/L (ref 38–126)
Anion gap: 10 (ref 5–15)
BUN: 10 mg/dL (ref 8–23)
CO2: 27 mmol/L (ref 22–32)
Calcium: 8.6 mg/dL — ABNORMAL LOW (ref 8.9–10.3)
Chloride: 104 mmol/L (ref 98–111)
Creatinine, Ser: 0.79 mg/dL (ref 0.44–1.00)
GFR calc Af Amer: >60 mL/min (ref >60)
GFR calc non Af Amer: >60 mL/min (ref >60)
Glucose, Bld: 94 mg/dL (ref 70–99)
Potassium: 3.5 mmol/L (ref 3.5–5.1)
Sodium: 141 mmol/L (ref 135–145)
Total Bilirubin: 0.8 mg/dL (ref 0.3–1.2)
Total Protein: 6 g/dL — ABNORMAL LOW (ref 6.5–8.1)

## 2020-02-08 LAB — FOLATE: Folate: 17.6 ng/mL (ref >5.9)

## 2020-02-08 LAB — ECHOCARDIOGRAM COMPLETE: Weight: 1883.61 oz

## 2020-02-08 LAB — VITAMIN B12: Vitamin B-12: 254 pg/mL (ref 180–914)

## 2020-02-08 LAB — CBC
HCT: 33.4 % — ABNORMAL LOW (ref 36.0–46.0)
Hemoglobin: 10.8 g/dL — ABNORMAL LOW (ref 12.0–15.0)
MCH: 29.6 pg (ref 26.0–34.0)
MCHC: 32.3 g/dL (ref 30.0–36.0)
MCV: 91.5 fL (ref 80.0–100.0)
Platelets: 198 10*3/uL (ref 150–400)
RBC: 3.65 MIL/uL — ABNORMAL LOW (ref 3.87–5.11)
RDW: 11.9 % (ref 11.5–15.5)
WBC: 4.2 10*3/uL (ref 4.0–10.5)
nRBC: 0 % (ref 0.0–0.2)

## 2020-02-08 LAB — RETICULOCYTES
Immature Retic Fract: 6.2 % (ref 2.3–15.9)
RBC.: 3.62 MIL/uL — ABNORMAL LOW (ref 3.87–5.11)
Retic Count, Absolute: 31.1 10*3/uL (ref 19.0–186.0)
Retic Ct Pct: 0.9 % (ref 0.4–3.1)

## 2020-02-08 LAB — IRON AND TIBC
Iron: 70 ug/dL (ref 28–170)
Saturation Ratios: 20 % (ref 10.4–31.8)
TIBC: 343 ug/dL (ref 250–450)
UIBC: 273 ug/dL

## 2020-02-08 LAB — TSH: TSH: 3.332 u[IU]/mL (ref 0.350–4.500)

## 2020-02-08 LAB — GLUCOSE, CAPILLARY: Glucose-Capillary: 74 mg/dL (ref 70–99)

## 2020-02-08 LAB — FERRITIN: Ferritin: 62 ng/mL (ref 11–307)

## 2020-02-08 MED ORDER — SODIUM CHLORIDE 0.9 % IV SOLN: INTRAVENOUS | Status: DC

## 2020-02-08 MED ORDER — MIDODRINE HCL 5 MG PO TABS
2.5000 mg | ORAL_TABLET | Freq: Two times a day (BID) | ORAL | Status: DC
  Administered 2020-02-08 – 2020-02-09 (×3): 2.5 mg via ORAL
  Filled 2020-02-08 (×4): qty 1

## 2020-02-08 MED ORDER — TRAZODONE HCL 50 MG PO TABS
50.0000 mg | ORAL_TABLET | Freq: Every evening | ORAL | Status: AC | PRN
  Administered 2020-02-08: 50 mg via ORAL
  Filled 2020-02-08: qty 1

## 2020-02-08 NOTE — TOC Initial Note (Addendum)
Transition of Care [facility]) - Initial/Assessment Note    Patient Details  Name: Michelle [name] MRN: [id number] Date of Birth: Nov 21, 1945  Transition of Care [facility]) CM/SW Contact:    Marilu Favre, RN Phone Number: 02/08/2020, 4:02 PM  Clinical Narrative:                 Spoke to patient and husband at bedside. Provided medicare.gov list of home health agencies.   Husband has had [facility] 4 times and would like to use them. Spoke to [place] with [facility], unfortunately , [facility] is not in network with patient's insurance.   Patient and husband aware. He would like [facility]. Spoke with Butch Penny with [facility] unfortunately they cannot accept due to staffing.  Husband aware next two choices are [facility] and than [facility] [place] spoke to Marvell Fuller can accept referral for home health PT, however start of care date is 02/15/20. Husband Juanda Crumble is aware and wants [facility] . Referral faxed to [facility] at [phone].  Expected Discharge Plan: [place] Barriers to Discharge: Continued Medical Work up   Patient Goals and CMS Choice Patient states their goals for this hospitalization and ongoing recovery are:: to return to home CMS Medicare.gov Compare Post Acute Care list provided to:: Patient Choice offered to / list presented to : Patient, Spouse  Expected Discharge Plan and Services Expected Discharge Plan: [place]   Discharge Planning Services: CM Consult Post Acute Care Choice: [place] arrangements for the past 2 months: Single Family Home                 DME Arranged: N/A         HH Arranged: PT          Prior Living Arrangements/Services Living arrangements for the past 2 months: Single Family Home Lives with:: Spouse Patient language and need for interpreter reviewed:: Yes        Need for Family Participation in Patient Care: Yes (Comment) Care giver support system in  place?: Yes (comment) Current home services: DME Criminal Activity/Legal Involvement Pertinent to Current Situation/Hospitalization: No - Comment as needed  Activities of Daily Living Home Assistive Devices/Equipment: None ADL Screening (condition at time of admission) Patient's cognitive ability adequate to safely complete daily activities?: No Is the patient deaf or have difficulty hearing?: No Does the patient have difficulty seeing, even when wearing glasses/contacts?: No Does the patient have difficulty concentrating, remembering, or making decisions?: Yes Patient able to express need for assistance with ADLs?: (hx dimentia) Does the patient have difficulty dressing or bathing?: Yes Independently performs ADLs?: Yes (appropriate for developmental age) Does the patient have difficulty walking or climbing stairs?: No Weakness of Legs: Both Weakness of Arms/Hands: None  Permission Sought/Granted   Permission granted to share information with : Yes, Verbal Permission Granted  Share Information with NAME: Mariaclara Raudabaugh husband           Emotional Assessment Appearance:: Appears stated age Attitude/Demeanor/Rapport: Engaged Affect (typically observed): Accepting   Alcohol / Substance Use: Not Applicable    Admission diagnosis:  Orthostatic hypotension [I95.1] Near syncope [R55] Syncope, unspecified syncope type [R55] Anemia, unspecified type [D64.9] Patient Active Problem List   Diagnosis Date Noted  . Orthostatic hypotension 02/08/2020  . Near syncope 02/07/2020  . Acute lower UTI 02/07/2020  . Early onset Alzheimer's dementia without behavioral disturbance ([place]) 11/10/2019  .  Idiopathic hypotension 10/12/2019  . Syncope 10/12/2019  . Pain in joint of right shoulder 09/20/2019  . Impingement syndrome of right shoulder region 09/20/2019  . Viral upper respiratory tract infection 08/24/2019  . Pain of joint of left ankle and foot 03/18/2019  . Peroneal  mononeuropathy, left 12/15/2016  . Preventative health care 10/31/2016  . Left foot drop 10/14/2016  . Weight loss 10/14/2016  . Tension-type headache, not intractable 09/17/2016  . Meningioma ([place]) 06/05/2016  . Mild cognitive impairment 04/02/2016  . Depression 04/02/2016  . Memory loss 03/21/2016  . Asymptomatic postmenopausal status 07/26/2009  . INTESTINAL GAS 09/18/2008  . ONYCHOMYCOSIS, TOENAILS 08/14/2008  . Unspecified glaucoma 07/24/2008  . Hypertensive disorder 07/24/2008  . MYALGIA 02/29/2008  . COUGH 07/26/2007  . ADENOMATOUS COLONIC POLYP 05/20/2007  . DIVERTICULOSIS, COLON 05/20/2007  . Hypothyroidism 04/07/2007  . Hypercholesterolemia 04/07/2007  . GERD 04/07/2007  . IBS 04/07/2007   PCP:  Ann Held, DO Pharmacy:   [id number] - [place], [place] - [address] [facility] [place] [address] Phone: [phone] Fax: [phone]     Social Determinants of Health (SDOH) Interventions    Readmission Risk Interventions No flowsheet data found.

## 2020-02-08 NOTE — Progress Notes (Addendum)
Patient getting OOB continuously since her husband and daughter left. Bed alarm activated with diversion activitties-ineffective, RN page on call provider for prn med to aid with sleep. Trazodone admin-ineffective. RN responded to bed alarm catching pt wobbly at the edge of the bed with TELE monitor removed. RN  able to catch her.RN and charge RN assist pt back to bed. ON call provider order body belt restraint. RN also called and informed pt's spouse of events. Patient denied pain or discomfort.   POC: Frequent rounds  Purwick in place for output

## 2020-02-08 NOTE — Progress Notes (Signed)
Arrived to room for EEG. Echo is in the room will try back later

## 2020-02-08 NOTE — Progress Notes (Signed)
  Echocardiogram 2D Echocardiogram has been performed.  Jennette Dubin 02/08/2020, 9:37 AM

## 2020-02-08 NOTE — Evaluation (Signed)
Occupational Therapy Evaluation Patient Details Name: Michelle [name] MRN: [id number] DOB: [birth year] Today's Date: 02/08/2020    History of Present Illness Pt is a 74 yo female s/p fall, orthostatic hypotension and UTI. Pt PMHx: Alzheimer's x5 years and HTN.   Clinical Impression   Pt PTA: Pt living with spouse and nearly independent to [place] level for ADL and mobility. Pt had been using walk-in shower with grab bar instead of tub shower with his assist. Pt and spouse share indoor chore duties. Currently, pt supervisionA level for ADL tasks and mobility. Pt with no LOB episodes.  No c/o or s/o dizziness/lightheadedness. Pt appears at her functional baseline with dementia - follows commands and oriented to birthday with assist. Pt does not require continued OT skilled services. OT signing off. + orthostatics.  O2 >90% and HR ~90s; BP in supine: 119/58; sitting; 115/85; standing 92/51 after 3 mins; sitting after exertion 122/58.      Follow Up Recommendations  No OT follow up;Supervision/Assistance - 24 hour    Equipment Recommendations  None recommended by OT    Recommendations for Other Services       Precautions / Restrictions Precautions Precautions: Fall;Other (comment) Precaution Comments: orthostatic Restrictions Weight Bearing Restrictions: No      Mobility Bed Mobility Overal bed mobility: Needs Assistance Bed Mobility: Supine to Sit     Supine to sit: Min guard     General bed mobility comments: cues to scoot to EOB and to conclude session; pt cues to scoot to [facility].  Transfers Overall transfer level: Needs assistance Equipment used: 1 person hand held assist Transfers: Sit to/from Stand Sit to Stand: Min guard         General transfer comment: minguardA for initial standing balance    Balance Overall balance assessment: No apparent balance deficits (not formally assessed)                                         ADL either  performed or assessed with clinical judgement   ADL Overall ADL's : At baseline                                       General ADL Comments: Pt performing transferring to commode, performing toilet hygiene/pericare, performing grooming at sink in standing and no physical assist required. Pt's BP +orthostatic.     Vision Baseline Vision/History: No visual deficits Patient Visual Report: No change from baseline Vision Assessment?: No apparent visual deficits     Perception     Praxis      Pertinent Vitals/Pain Pain Assessment: 0-10 Pain Score: 0-No pain Pain Intervention(s): Monitored during session     Hand Dominance Right   Extremity/Trunk Assessment Upper Extremity Assessment Upper Extremity Assessment: Generalized weakness   Lower Extremity Assessment Lower Extremity Assessment: Generalized weakness   Cervical / Trunk Assessment Cervical / Trunk Assessment: Normal   Communication Communication Communication: No difficulties   Cognition Arousal/Alertness: Awake/alert Behavior During Therapy: WFL for tasks assessed/performed Overall Cognitive Status: History of cognitive impairments - at baseline                                 General Comments: dementia at baseline; spouse able to answer PLOF  General Comments  O2 >90% and HR ~90s; BP in supine: 119/58; sitting; 115/85; standing 92/51 after 3 mins; sitting after exertion 122/58.    Exercises     Shoulder Instructions      Home Living Family/patient expects to be discharged to:: Private residence Living Arrangements: Spouse/significant other Available Help at Discharge: Family;Available 24 hours/day Type of Home: House Home Access: Stairs to enter [employer] of Steps: 6 Entrance Stairs-Rails: Can reach both Home Layout: One level     Bathroom Shower/Tub: Tub/shower unit;Walk-in shower   Bathroom Toilet: Handicapped height     Home Equipment: Environmental consultant - 2  wheels;Cane - single point   Additional Comments: Pt's spouse has RW and SPC from previous total joint surgeries. Pt      Prior Functioning/Environment Level of Independence: Needs assistance  Gait / Transfers Assistance Needed: [place] for gait ADL's / Homemaking Assistance Needed: pt's spouse reports that pt can perform own ADL and mobility with supervisionA. Pt had been using walk-in shower with grab bar instead of tub shower with  his assist. Pt and spouse share indoor chore duties.            OT Problem List: Decreased activity tolerance      OT Treatment/Interventions:      OT Goals(Current goals can be found in the care plan section) Acute Rehab OT Goals OT Goal Formulation: All assessment and education complete, DC therapy  OT Frequency:     Barriers to D/C:            Co-evaluation              AM-PAC OT "6 Clicks" Daily Activity     Outcome Measure Help from another person eating meals?: None Help from another person taking care of personal grooming?: A Little Help from another person toileting, which includes using toliet, bedpan, or urinal?: A Little Help from another person bathing (including washing, rinsing, drying)?: A Little Help from another person to put on and taking off regular upper body clothing?: None Help from another person to put on and taking off regular lower body clothing?: A Little 6 Click Score: 20   End of Session Equipment Utilized During Treatment: Gait belt Nurse Communication: Mobility status  Activity Tolerance: Patient tolerated treatment well Patient left: in bed;with call bell/phone within reach;with bed alarm set;with family/visitor present  OT Visit Diagnosis: Unsteadiness on feet (R26.81)                Time: [id number] OT Time Calculation (min): 43 min Charges:  OT General Charges $OT Visit: 1 Visit OT Evaluation $OT Eval Moderate Complexity: 1 Mod OT Treatments $Self Care/Home Management : 8-22  mins $Therapeutic Activity: 8-22 mins    Jefferey Pica, OTR/L Acute Rehabilitation Services Pager: [phone] Office: [phone]   Jayjay Littles C 02/08/2020, 11:19 AM

## 2020-02-08 NOTE — Progress Notes (Signed)
Progress Note    Michelle [name]  [id number] DOB: 06-26-1946  DOA: 02/07/2020 PCP: Ann Held, DO    Brief Narrative:     Medical records reviewed and are as summarized below:  Michelle [name] is an 74 y.o. female with a past medical history of Alzheimer's dementia, hypothyroidism, hyperlipidemia recent urinary tract infection admitted June 1 after a fall at home.  Work-up reveals orthostatic hypotension, urinary tract infection.  Not completely clear if loss of consciousness occurred so syncope work-up initiated.  Assessment/Plan:   Principal Problem:   Near syncope Active Problems:   Orthostatic hypotension   Hypothyroidism   Acute lower UTI   Unspecified glaucoma   Early onset Alzheimer's dementia without behavioral disturbance ([place])   #1.  Near syncope/orthostatic hypotension.  Patient received gentle IV fluids and remains orthostatic this morning.  Chest x-ray without infiltrate.  CT of the head no acute abnormalities but meningioma noted on previous imaging.  EEG within the limits of normal.  No metabolic derangements.  Urinalysis is concerning for UTI.  Home medications include midodrine 2.5 mg daily.  Husband states he does not think she lost consciousness.  He reports she has been eating and drinking her normal amount. -Follow echo -Continue IV fluids at a slightly higher rate -Increase midodrine to twice daily -Courage p.o. intake -Physical therapy evaluation -Occupational Therapy evaluation -Recheck orthostatics in the morning  #2.  Urinary tract infection.  Patient complained of dysuria on admission but denies this now.  UA is concerning for urinary tract infection.  Husband reports multiple urinary tract infections.  Chart review indicates last urine culture from April of this year shows E. coli that is sensitive to ceftriaxone.  This was initiated on admission. -Continue Rocephin for now -Follow urine culture -Monitor urine output -IV fluids as  noted above  #3.  History of Alzheimer's dementia.  Husband reports she is at her baseline.  Home medications include Aricept and Namenda.  Noted to be mildly bradycardic on admission with a heart rate range of 58-64.  Of note TSH was 4.0 in April of this year. -Holding Aricept due to bradycardia -Recheck TSH -Continue Namenda -Monitor  #4.  Hypothyroidism.  TSH 4.0 in April of this year.  Home medications include Synthroid -Obtain TSH -Continue Synthroid  #5.  Normocytic anemia.  History of same.  Hemoglobin 10.6.  FOBT negative. -Consider outpatient work-up for anemia   Family Communication/Anticipated D/C date and plan/Code Status   DVT prophylaxis: lovenox ordered. Code Status: Full Code.  Family Communication: husband at bedside Disposition Plan: Status is: Observation  The patient remains OBS appropriate and will d/c before 2 midnights.  Dispo: The patient is from: Home              Anticipated d/c is to: Home              Anticipated d/c date is: 1 day              Patient currently is not medically stable to d/c.         Medical Consultants:    None.   Anti-Infectives:    Rocephin 6/1>>  Subjective:   Sitting up in bed eating breakfast.  Denies pain or discomfort.  Objective:    Vitals:   02/07/20 2347 02/08/20 0445 02/08/20 0500 02/08/20 0832  BP: (!) 117/54 (!) 120/58  122/61  Pulse: 61 72  66  Resp: 17 16  16   Temp: 98 F (  36.7 C) 98.3 F (36.8 C)  (!) 97.5 F (36.4 C)  TempSrc: Oral Oral    SpO2:  99%  100%  Weight:   53.4 kg     Intake/Output Summary (Last 24 hours) at 02/08/2020 1148 Last data filed at 02/08/2020 1100 Gross per 24 hour  Intake 1949.87 ml  Output 1250 ml  Net 699.87 ml   Filed Weights   02/08/20 0500  Weight: 53.4 kg    Exam: General: Sitting up eating.  Alert interactive smiling no acute distress CV: Regular rate and rhythm no murmur gallop or rub no lower extremity edema Respiratory: No increased work of  breathing breath sounds are clear bilaterally I hear no wheeze no crackles Abdomen: Nondistended soft positive bowel sounds throughout nontender to palpation no guarding or rebounding Musculoskeletal: Joints without swelling/erythema full range of motion Neuro: Oriented to self only.  Responds appropriately to questions and commands.  Data Reviewed:   I have personally reviewed following labs and imaging studies:  Labs: Labs show the following:   Basic Metabolic Panel: Recent Labs  Lab 02/07/20 1232 02/08/20 0740  NA 141 141  K 3.7 3.5  CL 106 104  CO2 28 27  GLUCOSE 94 94  BUN 11 10  CREATININE 0.75 0.79  CALCIUM 8.4* 8.6*   GFR Estimated Creatinine Clearance: 52.8 mL/min (by C-G formula based on SCr of 0.79 mg/dL). Liver Function Tests: Recent Labs  Lab 02/07/20 1232 02/08/20 0740  AST 19 21  ALT 13 14  ALKPHOS 38 42  BILITOT 0.6 0.8  PROT 5.4* 6.0*  ALBUMIN 3.2* 3.5   No results for input(s): LIPASE, AMYLASE in the last 168 hours. No results for input(s): AMMONIA in the last 168 hours. Coagulation profile No results for input(s): INR, PROTIME in the last 168 hours.  CBC: Recent Labs  Lab 02/07/20 1232 02/08/20 0740  WBC 4.1 4.2  NEUTROABS 2.6  --   HGB 10.2* 10.8*  HCT 31.3* 33.4*  MCV 91.0 91.5  PLT 192 198   Cardiac Enzymes: No results for input(s): CKTOTAL, CKMB, CKMBINDEX, TROPONINI in the last 168 hours. BNP (last 3 results) No results for input(s): PROBNP in the last 8760 hours. CBG: Recent Labs  Lab 02/07/20 1212 02/07/20 2348 02/08/20 0500  GLUCAP 92 104* 74   D-Dimer: No results for input(s): DDIMER in the last 72 hours. Hgb A1c: No results for input(s): HGBA1C in the last 72 hours. Lipid Profile: No results for input(s): CHOL, HDL, LDLCALC, TRIG, CHOLHDL, LDLDIRECT in the last 72 hours. Thyroid function studies: No results for input(s): TSH, T4TOTAL, T3FREE, THYROIDAB in the last 72 hours.  Invalid input(s): FREET3 Anemia  work up: No results for input(s): VITAMINB12, FOLATE, FERRITIN, TIBC, IRON, RETICCTPCT in the last 72 hours. Sepsis Labs: Recent Labs  Lab 02/07/20 1232 02/08/20 0740  WBC 4.1 4.2    Microbiology Recent Results (from the past 240 hour(s))  SARS Coronavirus 2 by RT PCR (hospital order, performed in [facility] hospital lab) Nasopharyngeal Nasopharyngeal Swab     Status: None   Collection Time: 02/07/20  6:27 PM   Specimen: Nasopharyngeal Swab  Result Value Ref Range Status   SARS Coronavirus 2 NEGATIVE NEGATIVE Final    Comment: (NOTE) SARS-CoV-2 target nucleic acids are NOT DETECTED. The SARS-CoV-2 RNA is generally detectable in upper and lower respiratory specimens during the acute phase of infection. The lowest concentration of SARS-CoV-2 viral copies this assay can detect is 250 copies / mL. A negative result does  not preclude SARS-CoV-2 infection and should not be used as the sole basis for treatment or other patient management decisions.  A negative result may occur with improper specimen collection / handling, submission of specimen other than nasopharyngeal swab, presence of viral mutation(s) within the areas targeted by this assay, and inadequate number of viral copies (<250 copies / mL). A negative result must be combined with clinical observations, patient history, and epidemiological information. Fact Sheet for Patients:   StrictlyIdeas.no Fact Sheet for Healthcare Providers: BankingDealers.co.za This test is not yet approved or cleared  by the [place] FDA and has been authorized for detection and/or diagnosis of SARS-CoV-2 by FDA under an Emergency Use Authorization (EUA).  This EUA will remain in effect (meaning this test can be used) for the duration of the COVID-19 declaration under Section 564(b)(1) of the Act, 21 U.S.C. section 360bbb-3(b)(1), unless the authorization is terminated or revoked  sooner. Performed at [facility], [place] [address], [place], [place] [address]     Procedures and diagnostic studies:  CT Head Wo Contrast  Result Date: 02/07/2020 CLINICAL DATA:  Headache. Near and syncopal episodes. Orthostatic hypotension. EXAM: CT HEAD WITHOUT CONTRAST TECHNIQUE: Contiguous axial images were obtained from the base of the skull through the vertex without intravenous contrast. COMPARISON:  05/09/2017 head CT. FINDINGS: Brain: Dorsal right clival 2.0 cm extra-axial mass, characterized as meningioma on multiple prior brain MRI studies, not appreciably changed. No evidence of parenchymal hemorrhage or extra-axial fluid collection. No additional mass lesion, mass effect, or midline shift. No CT evidence of acute infarction. Nonspecific mild subcortical and periventricular white matter hypodensity, most in keeping with chronic small vessel ischemic change. Cerebral volume is age appropriate. No ventriculomegaly. Vascular: No acute abnormality. Skull: No evidence of calvarial fracture. Sinuses/Orbits: The visualized paranasal sinuses are essentially clear. No fluid levels. Stable small mucous retention cyst versus polyp in the medial right maxillary sinus. Other:  The mastoid air cells are unopacified. IMPRESSION: 1. No evidence of acute intracranial abnormality. No evidence of calvarial fracture. 2. 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 3. Mild chronic small vessel ischemic changes in the cerebral white matter. Electronically Signed   By: Ilona Sorrel M.D.   On: 02/07/2020 16:56   Portable Chest 1 View  Result Date: 02/07/2020 CLINICAL DATA:  Syncope and weakness EXAM: PORTABLE CHEST 1 VIEW COMPARISON:  10/02/2016 FINDINGS: The heart size and mediastinal contours are within normal limits. Both lungs are clear. The visualized skeletal structures are unremarkable. IMPRESSION: No active disease. Electronically Signed   By: Ulyses Jarred M.D.    On: 02/07/2020 21:38   EEG adult  Result Date: 02/08/2020 Lora Havens, MD     02/08/2020 11:11 AM Patient Name: NAYLENE GARMENDIA MRN: [id number] Epilepsy Attending: Lora Havens Referring Physician/Provider: Dr. Bonnielee Haff Date: 02/08/2020 Duration: 25.13 minutes Patient history: 74 year old female with near syncope.  EEG evaluate for seizures. Level of alertness: Awake, drowsy, sleep, comatose, lethargic AEDs during EEG study: Gabapentin Technical aspects: This EEG study was done with scalp electrodes positioned according to the 10-20 International system of electrode placement. Electrical activity was acquired at a sampling rate of 500Hz  and reviewed with a high frequency filter of 70Hz  and a low frequency filter of 1Hz . EEG data were recorded continuously and digitally stored. Description: The posterior dominant rhythm consists of 8-9 Hz activity of moderate voltage (25-35 uV) seen predominantly in posterior head regions, symmetric and reactive to eye opening  and eye closing. Hyperventilation and photic stimulation were not performed.   IMPRESSION: This study is within normal limits. No seizures or epileptiform discharges were seen throughout the recording. Priyanka Barbra Sarks    Medications:   . aspirin  81 mg Oral Daily  . brimonidine  1 drop Both Eyes TID  . donepezil  10 mg Oral QHS  . enoxaparin (LOVENOX) injection  40 mg Subcutaneous Q24H  . gabapentin  300 mg Oral BID  . levothyroxine  50 mcg Oral QAC breakfast  . loratadine  10 mg Oral Daily  . memantine  10 mg Oral BID  . midodrine  2.5 mg Oral BID WC  . pravastatin  20 mg Oral Daily  . sertraline  100 mg Oral Daily  . sodium chloride flush  3 mL Intravenous Q12H   Continuous Infusions: . sodium chloride 75 mL/hr at 02/08/20 1018  . cefTRIAXone (ROCEPHIN)  IV 1 g (02/07/20 2253)     LOS: 0 days   Radene Gunning NP [facility]   How to contact the [facility] Attending or Consulting provider [place] or covering provider  during after hours [place], for this patient?  1. Check the care team in [facility] and look for a) attending/consulting TR[facility] provider listed and b) the [facility] team listed 2. Log into [email or web address] and use [place]'s universal password to access. If you do not have the password, please contact the hospital operator. 3. Locate the [facility] provider you are looking for under [facility] and page to a number that you can be directly reached. 4. If you still have difficulty reaching the provider, please page the [facility] (Director on Call) for the Hospitalists listed on amion for assistance.  02/08/2020, 11:48 AM

## 2020-02-08 NOTE — Progress Notes (Signed)
EEG complete - results pending 

## 2020-02-08 NOTE — Evaluation (Signed)
Physical Therapy Evaluation Patient Details Name: Michelle [name] MRN: [id number] DOB: 08-Mar-1946 Today's Date: 02/08/2020   History of Present Illness  Pt is a 74 yo female admitted after sustaining a fall at home, orthostatic hypotension and UTI. Pt PMHx: Alzheimer's x5 years and HTN.    Clinical Impression  Pt presented supine in bed with HOB elevated, awake and willing to participate in therapy session. Pt's spouse present throughout session as well. Prior to admission, pt reported that she ambulated with use of a RW PRN and was independent with ADLs. Pt lives with her husband in a one level home with several steps to enter. At the time of evaluation, pt overall at a supervision to min guard level with all functional mobility including ambulation within her room, to and from the bathroom. Based on pt's level of support at home and current mobility status, would recommend f/u services via [employer]. Pt would continue to benefit from skilled physical therapy services at this time while admitted and after d/c to address the below listed limitations in order to improve overall safety and independence with functional mobility.     Follow Up Recommendations Home health PT;Supervision/Assistance - 24 hour    Equipment Recommendations  None recommended by PT    Recommendations for Other Services       Precautions / Restrictions Precautions Precautions: Fall Restrictions Weight Bearing Restrictions: No      Mobility  Bed Mobility Overal bed mobility: Needs Assistance Bed Mobility: Supine to Sit     Supine to sit: Supervision     General bed mobility comments: for safety; HOB elevated  Transfers Overall transfer level: Needs assistance Equipment used: None Transfers: Sit to/from Stand Sit to Stand: Min guard         General transfer comment: guarding for safety with transitions  Ambulation/Gait Ambulation/Gait assistance: Min guard Gait Distance (Feet): 30 Feet Assistive  device: None Gait Pattern/deviations: Step-through pattern;Decreased stride length Gait velocity: decreased   General Gait Details: pt with mild instability with slow, cautious gait in room; pt requiring frequent multimodal cueing to maintain attention to task  Stairs            Wheelchair Mobility    Modified Rankin (Stroke Patients Only)       Balance Overall balance assessment: Needs assistance Sitting-balance support: Feet supported Sitting balance-Leahy Scale: Good     Standing balance support: During functional activity Standing balance-Leahy Scale: Fair                               Pertinent Vitals/Pain Pain Assessment: No/denies pain    Home Living Family/patient expects to be discharged to:: Private residence Living Arrangements: Spouse/significant other Available Help at Discharge: Family;Available 24 hours/day Type of Home: House Home Access: Stairs to enter Entrance Stairs-Rails: Can reach both Entrance Stairs-Number of Steps: 6 Home Layout: One level Home Equipment: Walker - 2 wheels;Cane - single point Additional Comments: Pt's spouse has RW and SPC from previous total joint surgeries. Pt    Prior Function Level of Independence: Independent with assistive device(s)   Gait / Transfers Assistance Needed: uses a RW PRN; independent with ADLs           Hand Dominance   Dominant Hand: Right    Extremity/Trunk Assessment   Upper Extremity Assessment Upper Extremity Assessment: Generalized weakness    Lower Extremity Assessment Lower Extremity Assessment: Generalized weakness    Cervical / Trunk Assessment  Cervical / Trunk Assessment: Normal  Communication   Communication: HOH  Cognition Arousal/Alertness: Awake/alert Behavior During Therapy: WFL for tasks assessed/performed Overall Cognitive Status: History of cognitive impairments - at baseline Area of Impairment: Memory;Safety/judgement;Problem solving                      Memory: Decreased short-term memory   Safety/Judgement: Decreased awareness of deficits;Decreased awareness of safety   Problem Solving: Slow processing;Decreased initiation;Difficulty sequencing;Requires verbal cues        General Comments      Exercises     Assessment/Plan    PT Assessment Patient needs continued PT services  PT Problem List Decreased balance;Decreased mobility;Decreased coordination;Decreased cognition;Decreased knowledge of use of DME;Decreased safety awareness;Decreased knowledge of precautions       PT Treatment Interventions DME instruction;Gait training;Stair training;Functional mobility training;Therapeutic activities;Therapeutic exercise;Cognitive remediation;Balance training;Neuromuscular re-education;Patient/family education    PT Goals (Current goals can be found in the Care Plan section)  Acute Rehab PT Goals Patient Stated Goal: "go home" PT Goal Formulation: With patient/family Time For Goal Achievement: 02/22/20 Potential to Achieve Goals: Good    Frequency Min 3X/week   Barriers to discharge        Co-evaluation               AM-PAC PT "6 Clicks" Mobility  Outcome Measure Help needed turning from your back to your side while in a flat bed without using bedrails?: None Help needed moving from lying on your back to sitting on the side of a flat bed without using bedrails?: None Help needed moving to and from a bed to a chair (including a wheelchair)?: None Help needed standing up from a chair using your arms (e.g., wheelchair or bedside chair)?: None Help needed to walk in hospital room?: A Little Help needed climbing 3-5 steps with a railing? : A Little 6 Click Score: 22    End of Session Equipment Utilized During Treatment: Gait belt Activity Tolerance: Patient tolerated treatment well Patient left: in bed;with call bell/phone within reach;with family/visitor present;Other (comment)(seated upright at EOB) Nurse  Communication: Mobility status PT Visit Diagnosis: Other abnormalities of gait and mobility (R26.89)    Time: [id number] PT Time Calculation (min) (ACUTE ONLY): 29 min   Charges:   PT Evaluation $PT Eval Moderate Complexity: 1 Mod PT Treatments $Gait Training: 8-22 mins        Anastasio Champion, DPT  Acute Rehabilitation Services Pager [phone] Office [place] 02/08/2020, 3:01 PM

## 2020-02-08 NOTE — Procedures (Signed)
Patient Name: KALLEY THATCH  MRN: [id number]  Epilepsy Attending: Lora Havens  Referring Physician/Provider: Dr. Bonnielee Haff Date: 02/08/2020 Duration: 25.13 minutes  Patient history: 74 year old female with near syncope.  EEG evaluate for seizures.  Level of alertness: Awake, drowsy, sleep, comatose, lethargic  AEDs during EEG study: Gabapentin  Technical aspects: This EEG study was done with scalp electrodes positioned according to the 10-20 International system of electrode placement. Electrical activity was acquired at a sampling rate of 500Hz  and reviewed with a high frequency filter of 70Hz  and a low frequency filter of 1Hz . EEG data were recorded continuously and digitally stored.   Description: The posterior dominant rhythm consists of 8-9 Hz activity of moderate voltage (25-35 uV) seen predominantly in posterior head regions, symmetric and reactive to eye opening and eye closing. Hyperventilation and photic stimulation were not performed.     IMPRESSION: This study is within normal limits. No seizures or epileptiform discharges were seen throughout the recording.  Aundreya Souffrant Barbra Sarks

## 2020-02-09 ENCOUNTER — Telehealth: Payer: Self-pay | Admitting: Internal Medicine

## 2020-02-09 ENCOUNTER — Encounter: Payer: Self-pay | Admitting: Internal Medicine

## 2020-02-09 LAB — BASIC METABOLIC PANEL
Anion gap: 7 (ref 5–15)
BUN: 9 mg/dL (ref 8–23)
CO2: 26 mmol/L (ref 22–32)
Calcium: 8.4 mg/dL — ABNORMAL LOW (ref 8.9–10.3)
Chloride: 108 mmol/L (ref 98–111)
Creatinine, Ser: 0.89 mg/dL (ref 0.44–1.00)
GFR calc Af Amer: >60 mL/min (ref >60)
GFR calc non Af Amer: >60 mL/min (ref >60)
Glucose, Bld: 87 mg/dL (ref 70–99)
Potassium: 3 mmol/L — ABNORMAL LOW (ref 3.5–5.1)
Sodium: 141 mmol/L (ref 135–145)

## 2020-02-09 LAB — CBC
HCT: 31.6 % — ABNORMAL LOW (ref 36.0–46.0)
Hemoglobin: 10.3 g/dL — ABNORMAL LOW (ref 12.0–15.0)
MCH: 28.9 pg (ref 26.0–34.0)
MCHC: 32.6 g/dL (ref 30.0–36.0)
MCV: 88.8 fL (ref 80.0–100.0)
Platelets: 184 10*3/uL (ref 150–400)
RBC: 3.56 MIL/uL — ABNORMAL LOW (ref 3.87–5.11)
RDW: 11.8 % (ref 11.5–15.5)
WBC: 5.8 10*3/uL (ref 4.0–10.5)
nRBC: 0 % (ref 0.0–0.2)

## 2020-02-09 MED ORDER — MIDODRINE HCL 2.5 MG PO TABS: 2.5000 mg | ORAL_TABLET | Freq: Two times a day (BID) | ORAL | 1 refills | Status: DC

## 2020-02-09 MED ORDER — HALOPERIDOL LACTATE 5 MG/ML IJ SOLN
5.0000 mg | Freq: Once | INTRAMUSCULAR | Status: AC
  Administered 2020-02-09: 5 mg via INTRAVENOUS
  Filled 2020-02-09: qty 1

## 2020-02-09 MED ORDER — POTASSIUM CHLORIDE CRYS ER 20 MEQ PO TBCR
40.0000 meq | EXTENDED_RELEASE_TABLET | ORAL | Status: AC
  Administered 2020-02-09 (×2): 40 meq via ORAL
  Filled 2020-02-09 (×2): qty 2

## 2020-02-09 MED FILL — MIDODRINE HCL 2.5 MG TABLET: 2.5 | 30 days supply | Qty: 60 | Fill #0

## 2020-02-09 NOTE — Telephone Encounter (Signed)
Colon has been cancelled. Pts husband stated that the hospitalist suggested checking with Dr. Henrene Pastor to see if pt really needs to reschedule the colon due to her declining health including dementia. Encouraged pts husband to check with Dr. Etter Sjogren also regarding this issue and this message would be sent to Dr. Henrene Pastor also. Dr. Henrene Pastor notified.

## 2020-02-09 NOTE — Care Management (Signed)
Faxed discharge summary to [facility]

## 2020-02-09 NOTE — Progress Notes (Addendum)
Staffs responded to alarm finding pt haffway out of bed, somehow slid out of the restraints, completely naked. All previous interventions ineffective. On call provider paged. Order received.

## 2020-02-09 NOTE — Discharge Summary (Addendum)
Physician Discharge Summary  Michelle [name] [id number] DOB: May 02, 1946 DOA: 02/07/2020  PCP: Ann Held, DO  Admit date: 02/07/2020 Discharge date: 02/09/2020  Admitted From: home Discharge disposition: home   Recommendations for Outpatient Follow-Up:   Follow-up with primary care provider 1 to 2 weeks for evaluation of orthostatic hypotension.  Recommend basic metabolic panel to track potassium level.   Home health PT.    Discharge Diagnosis:   Principal Problem:   Near syncope Active Problems:   Orthostatic hypotension   Hypothyroidism   Acute lower UTI   Unspecified glaucoma   Early onset Alzheimer's dementia without behavioral disturbance ([place])    Discharge Condition: Improved.  Diet recommendation: regular  Wound care: None.  Code status: DNR   History of Present Illness:   Michelle [name] 74 year old female with past medical history significant for Alzheimer's insulin, hyperlipidemia came first after a fall.  Husband reports he did not see the fall but "heard it".  He does not believe she lost consciousness.  Husband reports patient did complain of feeling weak and lightheaded.  Reportedly systolic blood pressure in the 70s that time.  Patient also complained of dysuria as who confirms patient with recurrent urinary tract infections.  She denied chest pain palpitations shortness of breath.  No fever chills.  In the emergency department she was provided with IV fluids and still found to be orthostatic.  Admitted for syncope work-up   Hospital Course by Problem:   #1.  Near syncope/orthostatic symptoms.  Echo revealed an EF of 55% no wall motion maladies grade 1 diastolic dysfunction.  EKG without acute changes. Some concern for UTI and she received rocephin for 3 days. She remained afebrile and non-toxic appearing. She received fluids for 2 days.  Midodrine was increased from daily to twice daily and she was provided with TED hose. Husband educated  regarding keeping [place] up and changing position slowly as well as importance of wearing TED hose.  At discharge for orthostasis improved and she was asymptomatic.  #2.  Urinary tract infection.  Patient complained of dysuria on admission. UA is concerning for urinary tract infection.  Husband reported multiple urinary tract infections.  Chart review indicates last urine culture from April of this year shows E. coli that is sensitive to ceftriaxone. She received  Rocephin for 3 days.    #3.  History of Alzheimer's dementia.  Husband reported she is at her baseline but at night she became quite confused. Likely sundowners.  Home medications include Aricept and Namenda.    #4.  Hypothyroidism.  TSH 3.3. Continue Synthroid   #5.  Normocytic anemia.  History of same.  Hemoglobin 10.6.  FOBT negative. B12 and folate and iron within limits of normal. OP follow up.    Medical Consultants:      Discharge Exam:   Vitals:   02/08/20 2348 02/09/20 0417  BP: (!) 156/70 129/67  Pulse: 63   Resp: 18 18  Temp: 98.2 F (36.8 C) 98 F (36.7 C)  SpO2: 98% 99%   Vitals:   02/08/20 1554 02/08/20 2005 02/08/20 2348 02/09/20 0417  BP: 140/67 123/69 (!) 156/70 129/67  Pulse: 62 (!) 58 63   Resp: 16 16 18 18   Temp: 98.5 F (36.9 C) 98.3 F (36.8 C) 98.2 F (36.8 C) 98 F (36.7 C)  TempSrc: Oral Oral Oral Oral  SpO2: 99% 97% 98% 99%  Weight:        General exam: Appears  calm and comfortable drinking coffee. No acute distress Respiratory system: Clear to auscultation. Respiratory effort normal. Cardiovascular system: S1 & S2 heard, RRR. No JVD,  rubs, gallops or clicks. No murmurs. Gastrointestinal system: Abdomen is nondistended, soft and nontender. No organomegaly or masses felt. Normal bowel sounds heard. Central nervous system: Alert and oriented. No focal neurological deficits. Extremities: No clubbing,  or cyanosis. No edema. Skin: No rashes, lesions or ulcers. Psychiatry: Judgement and  insight appear normal. Mood & affect appropriate.    The results of significant diagnostics from this hospitalization (including imaging, microbiology, ancillary and laboratory) are listed below for reference.     Procedures and Diagnostic Studies:   CT Head Wo Contrast  Result Date: 02/07/2020 CLINICAL DATA:  Headache. Near and syncopal episodes. Orthostatic hypotension. EXAM: CT HEAD WITHOUT CONTRAST TECHNIQUE: Contiguous axial images were obtained from the base of the skull through the vertex without intravenous contrast. COMPARISON:  05/09/2017 head CT. FINDINGS: Brain: Dorsal right clival 2.0 cm extra-axial mass, characterized as meningioma on multiple prior brain MRI studies, not appreciably changed. No evidence of parenchymal hemorrhage or extra-axial fluid collection. No additional mass lesion, mass effect, or midline shift. No CT evidence of acute infarction. Nonspecific mild subcortical and periventricular white matter hypodensity, most in keeping with chronic small vessel ischemic change. Cerebral volume is age appropriate. No ventriculomegaly. Vascular: No acute abnormality. Skull: No evidence of calvarial fracture. Sinuses/Orbits: The visualized paranasal sinuses are essentially clear. No fluid levels. Stable small mucous retention cyst versus polyp in the medial right maxillary sinus. Other:  The mastoid air cells are unopacified. IMPRESSION: 1. No evidence of acute intracranial abnormality. No evidence of calvarial fracture. 2. 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 3. Mild chronic small vessel ischemic changes in the cerebral white matter. Electronically Signed   By: Ilona Sorrel M.D.   On: 02/07/2020 16:56   Portable Chest 1 View  Result Date: 02/07/2020 CLINICAL DATA:  Syncope and weakness EXAM: PORTABLE CHEST 1 VIEW COMPARISON:  10/02/2016 FINDINGS: The heart size and mediastinal contours are within normal limits. Both lungs are clear. The  visualized skeletal structures are unremarkable. IMPRESSION: No active disease. Electronically Signed   By: Ulyses Jarred M.D.   On: 02/07/2020 21:38   EEG adult  Result Date: 02/08/2020 Lora Havens, MD     02/08/2020 11:11 AM Patient Name: Michelle [name] MRN: [id number] Epilepsy Attending: Lora Havens Referring Physician/Provider: Dr. Bonnielee Haff Date: 02/08/2020 Duration: 25.13 minutes Patient history: 74 year old female with near syncope.  EEG evaluate for seizures. Level of alertness: Awake, drowsy, sleep, comatose, lethargic AEDs during EEG study: Gabapentin Technical aspects: This EEG study was done with scalp electrodes positioned according to the 10-20 International system of electrode placement. Electrical activity was acquired at a sampling rate of 500Hz  and reviewed with a high frequency filter of 70Hz  and a low frequency filter of 1Hz . EEG data were recorded continuously and digitally stored. Description: The posterior dominant rhythm consists of 8-9 Hz activity of moderate voltage (25-35 uV) seen predominantly in posterior head regions, symmetric and reactive to eye opening and eye closing. Hyperventilation and photic stimulation were not performed.   IMPRESSION: This study is within normal limits. No seizures or epileptiform discharges were seen throughout the recording. Lora Havens   ECHOCARDIOGRAM COMPLETE  Result Date: 02/08/2020    ECHOCARDIOGRAM REPORT   Patient Name:   Michelle [name] Date of Exam: 02/08/2020 Medical Rec #:  [id number]  Height:       64.0 in Accession #:    [id number]      Weight:       117.7 lb Date of Birth:  08/28/1946       BSA:          1.562 m Patient Age:    55 years        BP:           122/61 mmHg Patient Gender: F               HR:           61 bpm. Exam Location:  Inpatient Procedure: 2D Echo Indications:    Syncope R55  History:        Patient has no prior history of Echocardiogram examinations.                 Risk Factors:Hypertension and  Dyslipidemia.  Sonographer:    Mikki Santee RDCS (AE) Referring Phys: [place]  1. Left ventricular ejection fraction, by estimation, is 55%. The left ventricle has normal function. The left ventricle has no regional wall motion abnormalities. Left ventricular diastolic parameters are consistent with Grade I diastolic dysfunction (impaired relaxation).  2. Right ventricular systolic function is normal. The right ventricular size is normal. There is normal pulmonary artery systolic pressure. The estimated right ventricular systolic pressure is 99991111 mmHg.  3. Left atrial size was mildly dilated.  4. The mitral valve is normal in structure. No evidence of mitral valve regurgitation. No evidence of mitral stenosis.  5. The aortic valve is tricuspid. Aortic valve regurgitation is not visualized. No aortic stenosis is present.  6. The inferior vena cava is dilated in size with <50% respiratory variability, suggesting right atrial pressure of 15 mmHg. FINDINGS  Left Ventricle: Left ventricular ejection fraction, by estimation, is 55%. The left ventricle has normal function. The left ventricle has no regional wall motion abnormalities. The left ventricular internal cavity size was normal in size. There is no left ventricular hypertrophy. Left ventricular diastolic parameters are consistent with Grade I diastolic dysfunction (impaired relaxation). Right Ventricle: The right ventricular size is normal. No increase in right ventricular wall thickness. Right ventricular systolic function is normal. There is normal pulmonary artery systolic pressure. The tricuspid regurgitant velocity is 2.26 m/s, and  with an assumed right atrial pressure of 15 mmHg, the estimated right ventricular systolic pressure is 99991111 mmHg. Left Atrium: Left atrial size was mildly dilated. Right Atrium: Right atrial size was normal in size. Pericardium: Trivial pericardial effusion is present. Mitral Valve: The mitral valve is  normal in structure. Mild mitral annular calcification. No evidence of mitral valve regurgitation. No evidence of mitral valve stenosis. Tricuspid Valve: The tricuspid valve is normal in structure. Tricuspid valve regurgitation is mild. Aortic Valve: The aortic valve is tricuspid. Aortic valve regurgitation is not visualized. No aortic stenosis is present. Pulmonic Valve: The pulmonic valve was normal in structure. Pulmonic valve regurgitation is not visualized. Aorta: The aortic root is normal in size and structure. Venous: The inferior vena cava is dilated in size with less than 50% respiratory variability, suggesting right atrial pressure of 15 mmHg. IAS/Shunts: No atrial level shunt detected by color flow Doppler.  LEFT VENTRICLE PLAX 2D LVIDd:         4.20 cm  Diastology LVIDs:         2.90 cm  LV e' lateral:   6.65 cm/s LV PW:  1.00 cm  LV E/e' lateral: 9.1 LV IVS:        0.80 cm  LV e' medial:    5.19 cm/s LVOT diam:     2.00 cm  LV E/e' medial:  11.6 LV SV:         44 LV SV Index:   28 LVOT Area:     3.14 cm  RIGHT VENTRICLE RV S prime:     11.10 cm/s TAPSE (M-mode): 1.4 cm LEFT ATRIUM             Index       RIGHT ATRIUM           Index LA diam:        2.90 cm 1.86 cm/m  RA Area:     13.20 cm LA Vol (A2C):   61.7 ml 39.50 ml/m RA Volume:   27.50 ml  17.61 ml/m LA Vol (A4C):   29.6 ml 18.95 ml/m LA Biplane Vol: 46.9 ml 30.03 ml/m  AORTIC VALVE LVOT Vmax:   68.40 cm/s LVOT Vmean:  49.000 cm/s LVOT VTI:    0.141 m  AORTA Ao Root diam: 2.90 cm MITRAL VALVE               TRICUSPID VALVE MV Area (PHT): 2.83 cm    TR Peak grad:   20.4 mmHg MV Decel Time: 268 msec    TR Vmax:        226.00 cm/s MV E velocity: 60.40 cm/s MV A velocity: 80.10 cm/s  SHUNTS MV E/A ratio:  0.75        Systemic VTI:  0.14 m                            Systemic Diam: 2.00 cm Loralie Champagne MD Electronically signed by Loralie Champagne MD Signature Date/Time: 02/08/2020/4:38:21 PM    Final      Labs:   Basic Metabolic  Panel: Recent Labs  Lab 02/07/20 1232 02/07/20 1232 02/08/20 0740 02/09/20 0430  NA 141  --  141 141  K 3.7   < > 3.5 3.0*  CL 106  --  104 108  CO2 28  --  27 26  GLUCOSE 94  --  94 87  BUN 11  --  10 9  CREATININE 0.75  --  0.79 0.89  CALCIUM 8.4*  --  8.6* 8.4*   < > = values in this interval not displayed.   GFR Estimated Creatinine Clearance: 47.5 mL/min (by C-G formula based on SCr of 0.89 mg/dL). Liver Function Tests: Recent Labs  Lab 02/07/20 1232 02/08/20 0740  AST 19 21  ALT 13 14  ALKPHOS 38 42  BILITOT 0.6 0.8  PROT 5.4* 6.0*  ALBUMIN 3.2* 3.5   No results for input(s): LIPASE, AMYLASE in the last 168 hours. No results for input(s): AMMONIA in the last 168 hours. Coagulation profile No results for input(s): INR, PROTIME in the last 168 hours.  CBC: Recent Labs  Lab 02/07/20 1232 02/08/20 0740 02/09/20 0430  WBC 4.1 4.2 5.8  NEUTROABS 2.6  --   --   HGB 10.2* 10.8* 10.3*  HCT 31.3* 33.4* 31.6*  MCV 91.0 91.5 88.8  PLT 192 198 184   Cardiac Enzymes: No results for input(s): CKTOTAL, CKMB, CKMBINDEX, TROPONINI in the last 168 hours. BNP: Invalid input(s): POCBNP CBG: Recent Labs  Lab 02/07/20 1212 02/07/20 2348 02/08/20 0500  GLUCAP 92 104* 74  D-Dimer No results for input(s): DDIMER in the last 72 hours. Hgb A1c No results for input(s): HGBA1C in the last 72 hours. Lipid Profile No results for input(s): CHOL, HDL, LDLCALC, TRIG, CHOLHDL, LDLDIRECT in the last 72 hours. Thyroid function studies Recent Labs    02/08/20 1422  TSH 3.332   Anemia work up Recent Labs    02/08/20 1420 02/08/20 1422  VITAMINB12  --  254  FOLATE  --  17.6  FERRITIN  --  62  TIBC  --  343  IRON  --  70  RETICCTPCT 0.9  --    Microbiology Recent Results (from the past 240 hour(s))  SARS Coronavirus 2 by RT PCR (hospital order, performed in [facility] hospital lab) Nasopharyngeal Nasopharyngeal Swab     Status: None   Collection Time: 02/07/20   6:27 PM   Specimen: Nasopharyngeal Swab  Result Value Ref Range Status   SARS Coronavirus 2 NEGATIVE NEGATIVE Final    Comment: (NOTE) SARS-CoV-2 target nucleic acids are NOT DETECTED. The SARS-CoV-2 RNA is generally detectable in upper and lower respiratory specimens during the acute phase of infection. The lowest concentration of SARS-CoV-2 viral copies this assay can detect is 250 copies / mL. A negative result does not preclude SARS-CoV-2 infection and should not be used as the sole basis for treatment or other patient management decisions.  A negative result may occur with improper specimen collection / handling, submission of specimen other than nasopharyngeal swab, presence of viral mutation(s) within the areas targeted by this assay, and inadequate number of viral copies (<250 copies / mL). A negative result must be combined with clinical observations, patient history, and epidemiological information. Fact Sheet for Patients:   StrictlyIdeas.no Fact Sheet for Healthcare Providers: BankingDealers.co.za This test is not yet approved or cleared  by the [place] FDA and has been authorized for detection and/or diagnosis of SARS-CoV-2 by FDA under an Emergency Use Authorization (EUA).  This EUA will remain in effect (meaning this test can be used) for the duration of the COVID-19 declaration under Section 564(b)(1) of the Act, 21 U.S.C. section 360bbb-3(b)(1), unless the authorization is terminated or revoked sooner. Performed at [facility], [place] [address] [place], [place] [address]      Discharge Instructions:   Discharge Instructions     Call MD for:  difficulty breathing, headache or visual disturbances   Complete by: As directed    Call MD for:  persistant dizziness or light-headedness   Complete by: As directed    Call MD for:  temperature >100.4   Complete by: As directed    Diet - low sodium heart  healthy   Complete by: As directed    Discharge instructions   Complete by: As directed    Take medications as directed Wear TED hose HOB up  Change positions slowly Follow up with PCP 1-2 weeks for evaluation of orthostatic hypotension Recommend BMET to track potassium level   Increase activity slowly   Complete by: As directed       Allergies as of 02/09/2020   No Known Allergies      Medication List     TAKE these medications    Alphagan P 0.1 % Soln Generic drug: brimonidine Place 1 drop into both eyes in the morning and at bedtime.   AMBULATORY NON FORMULARY MEDICATION Take 1 tablet by mouth daily. IBgard: Takes prn   aspirin 81 MG tablet Take 81 mg by mouth daily.  CALCIUM PO Take 1 tablet by mouth every evening.   Cranberry 1000 MG Caps Take 1,000 mg by mouth daily.   CULTURELLE DIGESTIVE DAILY PO Take 1 tablet by mouth daily.   donepezil 10 MG tablet Commonly known as: ARICEPT Take 1 tablet daily What changed:  how much to take how to take this when to take this additional instructions   fluticasone 50 MCG/ACT nasal spray Commonly known as: FLONASE Place 2 sprays into both nostrils daily. What changed:  when to take this reasons to take this   gabapentin 300 MG capsule Commonly known as: NEURONTIN Take 1 cap twice a day What changed:  how much to take how to take this when to take this   levothyroxine 50 MCG tablet Commonly known as: SYNTHROID TAKE 1 TABLET BY MOUTH EVERY DAY What changed: when to take this   loratadine 10 MG tablet Commonly known as: CLARITIN Take 10 mg by mouth daily.   memantine 10 MG tablet Commonly known as: Namenda Take 1 tablet (10 mg total) by mouth 2 (two) times daily.   midodrine 2.5 MG tablet Commonly known as: PROAMATINE Take 1 tablet (2.5 mg total) by mouth 2 (two) times daily with a meal. What changed: when to take this   multivitamin tablet Take 1 tablet by mouth daily.   Na Sulfate-K  Sulfate-Mg Sulf 17.5-3.13-1.6 GM/177ML Soln Suprep (no substitutions)-TAKE AS DIRECTED.   pravastatin 20 MG tablet Commonly known as: PRAVACHOL TAKE 1 TABLET BY MOUTH EVERY DAY   sertraline 100 MG tablet Commonly known as: Zoloft Take 1 tablet (100 mg total) by mouth daily.   VITAMIN D PO Take 1,000 Units by mouth daily.       Follow-up Information     [facility].   Specialty: Emergency Medicine Why: As needed, If symptoms worsen Contact information: [address] [id number] [place], [place], DO In 3 days.   Specialty: Family Medicine Contact information: [address] [place] [address] [phone]             Time coordinating discharge: 40 minutes  Signed:  Radene Gunning NP  [facility] 02/09/2020, 10:47 AM

## 2020-02-09 NOTE — Progress Notes (Signed)
Physical Therapy Treatment Patient Details Name: Michelle [name] MRN: [id number] DOB: 1945-10-02 Today's Date: 02/09/2020    History of Present Illness Pt is a 74 yo female admitted after sustaining a fall at home, orthostatic hypotension and UTI. Pt PMHx: Alzheimer's x5 years and HTN.    PT Comments    Pt much more confused and with a flat affect today as compared to yesterday's playful banter. Husband was present and emotional regarding her "not being herself". Reassured him that this was common for pts who are hospitalized and have a hx of dementia. Pt's husband reporting that plan is to potentially d/c home tomorrow. She was able to express that she needed to urinate upon arrival and assisted pt to bathroom and back. She was asymptomatic throughout but mildly unsteady with ambulation in room, similar to previous session. Pt would continue to benefit from skilled physical therapy services at this time while admitted and after d/c to address the below listed limitations in order to improve overall safety and independence with functional mobility.   Follow Up Recommendations  Home health PT;Supervision/Assistance - 24 hour     Equipment Recommendations  None recommended by PT    Recommendations for Other Services       Precautions / Restrictions Precautions Precautions: Fall Restrictions Weight Bearing Restrictions: No    Mobility  Bed Mobility Overal bed mobility: Needs Assistance Bed Mobility: Supine to Sit     Supine to sit: Min assist     General bed mobility comments: assistance for trunk elevation  Transfers Overall transfer level: Needs assistance Equipment used: None Transfers: Sit to/from Stand Sit to Stand: Min guard         General transfer comment: guarding for safety with transitions  Ambulation/Gait Ambulation/Gait assistance: Min guard Gait Distance (Feet): 20 Feet Assistive device: None Gait Pattern/deviations: Step-through pattern;Decreased  stride length Gait velocity: decreased   General Gait Details: pt with mild instability with slow, cautious gait in room; pt requiring frequent multimodal cueing to maintain attention to task   Stairs             Wheelchair Mobility    Modified Rankin (Stroke Patients Only)       Balance Overall balance assessment: Needs assistance Sitting-balance support: Feet supported Sitting balance-Leahy Scale: Good     Standing balance support: During functional activity Standing balance-Leahy Scale: Fair                              Cognition Arousal/Alertness: Awake/alert Behavior During Therapy: WFL for tasks assessed/performed Overall Cognitive Status: Impaired/Different from baseline Area of Impairment: Memory;Safety/judgement;Problem solving                     Memory: Decreased short-term memory   Safety/Judgement: Decreased awareness of deficits;Decreased awareness of safety   Problem Solving: Slow processing;Decreased initiation;Difficulty sequencing;Requires verbal cues        Exercises      General Comments        Pertinent Vitals/Pain Pain Assessment: No/denies pain    Home Living                      Prior Function            PT Goals (current goals can now be found in the care plan section) Acute Rehab PT Goals PT Goal Formulation: With patient/family Time For Goal Achievement: 02/22/20 Potential to Achieve Goals: Good Progress  towards PT goals: Progressing toward goals    Frequency    Min 3X/week      PT Plan Current plan remains appropriate    Co-evaluation              AM-PAC PT "6 Clicks" Mobility   Outcome Measure  Help needed turning from your back to your side while in a flat bed without using bedrails?: None Help needed moving from lying on your back to sitting on the side of a flat bed without using bedrails?: A Little Help needed moving to and from a bed to a chair (including a  wheelchair)?: A Little Help needed standing up from a chair using your arms (e.g., wheelchair or bedside chair)?: None Help needed to walk in hospital room?: A Little Help needed climbing 3-5 steps with a railing? : A Little 6 Click Score: 20    End of Session   Activity Tolerance: Patient tolerated treatment well Patient left: in chair;with call bell/phone within reach;with family/visitor present;with restraints reapplied;Other (comment)(RN present as well) Nurse Communication: Mobility status PT Visit Diagnosis: Other abnormalities of gait and mobility (R26.89)     Time: [id number] PT Time Calculation (min) (ACUTE ONLY): 21 min  Charges:  $Therapeutic Activity: 8-22 mins                     Anastasio Champion, DPT  Acute Rehabilitation Services Pager [phone] Office [place] 02/09/2020, 10:28 AM

## 2020-02-09 NOTE — Progress Notes (Signed)
For safety reason, patient sat with staffs at the nursing's station for approx 30 minutes. Haldol given and intervention appears effective. TELE reapplied. Patient in bed resting.

## 2020-02-09 NOTE — Progress Notes (Signed)
Patient continue to removes TELE and noted combative when staffs replace TELE box, TELE monitor on standby at this time. On call provider notified.

## 2020-02-10 NOTE — Telephone Encounter (Signed)
Noted  

## 2020-02-10 NOTE — Telephone Encounter (Signed)
Only small polyps on last colonoscopy.  If her overall health is significantly declining, then surveillance colonoscopy is unnecessary

## 2020-02-16 ENCOUNTER — Other Ambulatory Visit: Payer: Self-pay

## 2020-02-16 ENCOUNTER — Ambulatory Visit (INDEPENDENT_AMBULATORY_CARE_PROVIDER_SITE_OTHER): Payer: Medicare HMO | Admitting: Family Medicine

## 2020-02-16 ENCOUNTER — Encounter: Payer: Self-pay | Admitting: Family Medicine

## 2020-02-16 VITALS — BP 98/50 | HR 68 | Temp 97.2°F | Resp 18 | Ht 64.0 in | Wt 126.8 lb

## 2020-02-16 DIAGNOSIS — I950 Idiopathic hypotension: Secondary | ICD-10-CM

## 2020-02-16 DIAGNOSIS — Z87440 Personal history of urinary (tract) infections: Secondary | ICD-10-CM | POA: Diagnosis not present

## 2020-02-16 MED ORDER — MIDODRINE HCL 2.5 MG PO TABS: 2.5000 mg | ORAL_TABLET | Freq: Three times a day (TID) | ORAL | 1 refills | Status: DC

## 2020-02-16 NOTE — Progress Notes (Signed)
Patient ID: Michelle [name], female    DOB: [birth year]  Age: 74 y.o. MRN: [id number]    Subjective:  Subjective  HPI Michelle [name] presents for hosp f/u for syncope.   Her bp was very low--- er incr her midodrine to bid   bp is still low but she is feeling better.   We also need to recheck her urine --- hx uti     No other complaints Her husband is with her   Review of Systems  Constitutional: Negative for appetite change, diaphoresis, fatigue and unexpected weight change.  Eyes: Negative for pain, redness and visual disturbance.  Respiratory: Negative for cough, chest tightness, shortness of breath and wheezing.   Cardiovascular: Negative for chest pain, palpitations and leg swelling.  Endocrine: Negative for cold intolerance, heat intolerance, polydipsia, polyphagia and polyuria.  Genitourinary: Negative for difficulty urinating, dysuria and frequency.  Neurological: Negative for dizziness, light-headedness, numbness and headaches.  Psychiatric/Behavioral: Positive for confusion and decreased concentration.    History Past Medical History:  Diagnosis Date  . Adenomatous colon polyp   . Allergy   . Dementia ([place])   . GERD (gastroesophageal reflux disease)   . Glaucoma   . Hx of hearing loss    bilateral hearing aids  . Hyperlipidemia   . Hypertension   . IBS (irritable bowel syndrome)   . Low back pain   . Thyroid disease     She has a past surgical history that includes Tubal ligation (1977); Cholecystectomy (1998); Abdominal hysterectomy (1998); Gum surgery (1996 / 2013); Cataract extraction, bilateral (Bilateral, 04/2015); and Colonoscopy (12/10/2016).   Her family history includes Breast cancer in her maternal grandmother; Cancer in her father; Cancer (age of onset: 28) in her brother; Emphysema in her mother; Glaucoma in her mother; Hypertension in her father and mother; Macular degeneration in her father; Other in her brother; Prostate cancer in her brother and  father.She reports that she has never smoked. She has never used smokeless tobacco. She reports that she does not drink alcohol and does not use drugs.  Current Outpatient Medications on File Prior to Visit  Medication Sig Dispense Refill  . ALPHAGAN P 0.1 % SOLN Place 1 drop into both eyes in the morning and at bedtime.     . AMBULATORY NON FORMULARY MEDICATION Take 1 tablet by mouth daily. IBgard: Takes prn     . aspirin 81 MG tablet Take 81 mg by mouth daily.     Marland Kitchen CALCIUM PO Take 1 tablet by mouth every evening.     . Cranberry 1000 MG CAPS Take 1,000 mg by mouth daily.    Marland Kitchen donepezil (ARICEPT) 10 MG tablet Take 1 tablet daily (Patient taking differently: Take 10 mg by mouth at bedtime. ) 90 tablet 3  . fluticasone (FLONASE) 50 MCG/ACT nasal spray Place 2 sprays into both nostrils daily. (Patient taking differently: Place 2 sprays into both nostrils daily as needed for allergies. ) 16 g 5  . gabapentin (NEURONTIN) 300 MG capsule Take 1 cap twice a day (Patient taking differently: Take 300 mg by mouth 2 (two) times daily. Take 1 cap twice a day) 180 capsule 3  . Lactobacillus-Inulin (CULTURELLE DIGESTIVE DAILY PO) Take 1 tablet by mouth daily.     Marland Kitchen levothyroxine (SYNTHROID) 50 MCG tablet TAKE 1 TABLET BY MOUTH EVERY DAY (Patient taking differently: Take 50 mcg by mouth daily before breakfast. ) 90 tablet 3  . loratadine (CLARITIN) 10 MG tablet Take 10 mg by  mouth daily.    . memantine (NAMENDA) 10 MG tablet Take 1 tablet (10 mg total) by mouth 2 (two) times daily. 180 tablet 3  . Multiple Vitamin (MULTIVITAMIN) tablet Take 1 tablet by mouth daily.    . Na Sulfate-K Sulfate-Mg Sulf 17.5-3.13-1.6 GM/177ML SOLN Suprep (no substitutions)-TAKE AS DIRECTED. 354 mL 0  . pravastatin (PRAVACHOL) 20 MG tablet TAKE 1 TABLET BY MOUTH EVERY DAY (Patient taking differently: Take 20 mg by mouth daily. ) 90 tablet 3  . sertraline (ZOLOFT) 100 MG tablet Take 1 tablet (100 mg total) by mouth daily. 90 tablet 3    . VITAMIN D PO Take 1,000 Units by mouth daily.      No current facility-administered medications on file prior to visit.     Objective:  Objective  Physical Exam Vitals and nursing note reviewed.  Constitutional:      Appearance: She is well-developed.  HENT:     Head: Normocephalic and atraumatic.  Eyes:     Conjunctiva/sclera: Conjunctivae normal.  Neck:     Thyroid: No thyromegaly.     Vascular: No carotid bruit or JVD.  Cardiovascular:     Rate and Rhythm: Normal rate and regular rhythm.     Heart sounds: Normal heart sounds. No murmur heard.   Pulmonary:     Effort: Pulmonary effort is normal. No respiratory distress.     Breath sounds: Normal breath sounds. No wheezing or rales.  Chest:     Chest wall: No tenderness.  Musculoskeletal:     Cervical back: Normal range of motion and neck supple.  Neurological:     Mental Status: She is alert. She is disoriented.     Motor: No weakness.     Gait: Gait normal.     Deep Tendon Reflexes: Reflexes normal.    BP (!) 98/50 (BP Location: Right Arm, Patient Position: Sitting, Cuff Size: Normal)   Pulse 68   Temp (!) 97.2 F (36.2 C) (Temporal)   Resp 18   Ht 5\' 4"  (1.626 m)   Wt 126 lb 12.8 oz (57.5 kg)   SpO2 96%   BMI 21.77 kg/m  Wt Readings from Last 3 Encounters:  02/16/20 126 lb 12.8 oz (57.5 kg)  02/08/20 117 lb 11.6 oz (53.4 kg)  02/03/20 126 lb (57.2 kg)     Lab Results  Component Value Date   WBC 5.8 02/09/2020   HGB 10.3 (L) 02/09/2020   HCT 31.6 (L) 02/09/2020   PLT 184 02/09/2020   GLUCOSE 87 02/09/2020   CHOL 178 12/26/2019   TRIG 80.0 12/26/2019   HDL 66.90 12/26/2019   LDLCALC 95 12/26/2019   ALT 14 02/08/2020   AST 21 02/08/2020   NA 141 02/09/2020   K 3.0 (L) 02/09/2020   CL 108 02/09/2020   CREATININE 0.89 02/09/2020   [name] 9 02/09/2020   CO2 26 02/09/2020   TSH 3.332 02/08/2020   MICROALBUR 1.0 11/03/2014    CT Head Wo Contrast  Result Date: 02/07/2020 CLINICAL DATA:   Headache. Near and syncopal episodes. Orthostatic hypotension. EXAM: CT HEAD WITHOUT CONTRAST TECHNIQUE: Contiguous axial images were obtained from the base of the skull through the vertex without intravenous contrast. COMPARISON:  05/09/2017 head CT. FINDINGS: Brain: Dorsal right clival 2.0 cm extra-axial mass, characterized as meningioma on multiple prior brain MRI studies, not appreciably changed. No evidence of parenchymal hemorrhage or extra-axial fluid collection. No additional mass lesion, mass effect, or midline shift. No CT evidence of acute infarction.  Nonspecific mild subcortical and periventricular white matter hypodensity, most in keeping with chronic small vessel ischemic change. Cerebral volume is age appropriate. No ventriculomegaly. Vascular: No acute abnormality. Skull: No evidence of calvarial fracture. Sinuses/Orbits: The visualized paranasal sinuses are essentially clear. No fluid levels. Stable small mucous retention cyst versus polyp in the medial right maxillary sinus. Other:  The mastoid air cells are unopacified. IMPRESSION: 1. No evidence of acute intracranial abnormality. No evidence of calvarial fracture. 2. 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 3. Mild chronic small vessel ischemic changes in the cerebral white matter. Electronically Signed   By: Ilona Sorrel M.D.   On: 02/07/2020 16:56   Portable Chest 1 View  Result Date: 02/07/2020 CLINICAL DATA:  Syncope and weakness EXAM: PORTABLE CHEST 1 VIEW COMPARISON:  10/02/2016 FINDINGS: The heart size and mediastinal contours are within normal limits. Both lungs are clear. The visualized skeletal structures are unremarkable. IMPRESSION: No active disease. Electronically Signed   By: Ulyses Jarred M.D.   On: 02/07/2020 21:38   EEG adult  Result Date: 02/08/2020 Lora Havens, MD     02/08/2020 11:11 AM Patient Name: Michelle [name] MRN: [id number] Epilepsy Attending: Lora Havens  Referring Physician/Provider: Dr. Bonnielee Haff Date: 02/08/2020 Duration: 25.13 minutes Patient history: 74 year old female with near syncope.  EEG evaluate for seizures. Level of alertness: Awake, drowsy, sleep, comatose, lethargic AEDs during EEG study: Gabapentin Technical aspects: This EEG study was done with scalp electrodes positioned according to the 10-20 International system of electrode placement. Electrical activity was acquired at a sampling rate of 500Hz  and reviewed with a high frequency filter of 70Hz  and a low frequency filter of 1Hz . EEG data were recorded continuously and digitally stored. Description: The posterior dominant rhythm consists of 8-9 Hz activity of moderate voltage (25-35 uV) seen predominantly in posterior head regions, symmetric and reactive to eye opening and eye closing. Hyperventilation and photic stimulation were not performed.   IMPRESSION: This study is within normal limits. No seizures or epileptiform discharges were seen throughout the recording. Lora Havens   ECHOCARDIOGRAM COMPLETE  Result Date: 02/08/2020    ECHOCARDIOGRAM REPORT   Patient Name:   Michelle [name] Date of Exam: 02/08/2020 Medical Rec #:  [id number]       Height:       64.0 in Accession #:    [id number]      Weight:       117.7 lb Date of Birth:  Jan 06, 1946       BSA:          1.562 m Patient Age:    12 years        BP:           122/61 mmHg Patient Gender: F               HR:           61 bpm. Exam Location:  Inpatient Procedure: 2D Echo Indications:    Syncope R55  History:        Patient has no prior history of Echocardiogram examinations.                 Risk Factors:Hypertension and Dyslipidemia.  Sonographer:    Mikki Santee RDCS (AE) Referring Phys: [place]  1. Left ventricular ejection fraction, by estimation, is 55%. The left ventricle has normal function. The left ventricle has no regional wall motion abnormalities. Left ventricular diastolic  parameters are  consistent with Grade I diastolic dysfunction (impaired relaxation).  2. Right ventricular systolic function is normal. The right ventricular size is normal. There is normal pulmonary artery systolic pressure. The estimated right ventricular systolic pressure is 29.5 mmHg.  3. Left atrial size was mildly dilated.  4. The mitral valve is normal in structure. No evidence of mitral valve regurgitation. No evidence of mitral stenosis.  5. The aortic valve is tricuspid. Aortic valve regurgitation is not visualized. No aortic stenosis is present.  6. The inferior vena cava is dilated in size with <50% respiratory variability, suggesting right atrial pressure of 15 mmHg. FINDINGS  Left Ventricle: Left ventricular ejection fraction, by estimation, is 55%. The left ventricle has normal function. The left ventricle has no regional wall motion abnormalities. The left ventricular internal cavity size was normal in size. There is no left ventricular hypertrophy. Left ventricular diastolic parameters are consistent with Grade I diastolic dysfunction (impaired relaxation). Right Ventricle: The right ventricular size is normal. No increase in right ventricular wall thickness. Right ventricular systolic function is normal. There is normal pulmonary artery systolic pressure. The tricuspid regurgitant velocity is 2.26 m/s, and  with an assumed right atrial pressure of 15 mmHg, the estimated right ventricular systolic pressure is 18.8 mmHg. Left Atrium: Left atrial size was mildly dilated. Right Atrium: Right atrial size was normal in size. Pericardium: Trivial pericardial effusion is present. Mitral Valve: The mitral valve is normal in structure. Mild mitral annular calcification. No evidence of mitral valve regurgitation. No evidence of mitral valve stenosis. Tricuspid Valve: The tricuspid valve is normal in structure. Tricuspid valve regurgitation is mild. Aortic Valve: The aortic valve is tricuspid. Aortic valve regurgitation is  not visualized. No aortic stenosis is present. Pulmonic Valve: The pulmonic valve was normal in structure. Pulmonic valve regurgitation is not visualized. Aorta: The aortic root is normal in size and structure. Venous: The inferior vena cava is dilated in size with less than 50% respiratory variability, suggesting right atrial pressure of 15 mmHg. IAS/Shunts: No atrial level shunt detected by color flow Doppler.  LEFT VENTRICLE PLAX 2D LVIDd:         4.20 cm  Diastology LVIDs:         2.90 cm  LV e' lateral:   6.65 cm/s LV PW:         1.00 cm  LV E/e' lateral: 9.1 LV IVS:        0.80 cm  LV e' medial:    5.19 cm/s LVOT diam:     2.00 cm  LV E/e' medial:  11.6 LV SV:         44 LV SV Index:   28 LVOT Area:     3.14 cm  RIGHT VENTRICLE RV S prime:     11.10 cm/s TAPSE (M-mode): 1.4 cm LEFT ATRIUM             Index       RIGHT ATRIUM           Index LA diam:        2.90 cm 1.86 cm/m  RA Area:     13.20 cm LA Vol (A2C):   61.7 ml 39.50 ml/m RA Volume:   27.50 ml  17.61 ml/m LA Vol (A4C):   29.6 ml 18.95 ml/m LA Biplane Vol: 46.9 ml 30.03 ml/m  AORTIC VALVE LVOT Vmax:   68.40 cm/s LVOT Vmean:  49.000 cm/s LVOT VTI:    0.141 m  AORTA Ao  Root diam: 2.90 cm MITRAL VALVE               TRICUSPID VALVE MV Area (PHT): 2.83 cm    TR Peak grad:   20.4 mmHg MV Decel Time: 268 msec    TR Vmax:        226.00 cm/s MV E velocity: 60.40 cm/s MV A velocity: 80.10 cm/s  SHUNTS MV E/A ratio:  0.75        Systemic VTI:  0.14 m                            Systemic Diam: 2.00 cm Loralie Champagne MD Electronically signed by Loralie Champagne MD Signature Date/Time: 02/08/2020/4:38:21 PM    Final      Assessment & Plan:  Plan  I have changed Lester midodrine. I am also having her maintain her aspirin, multivitamin, Alphagan P, AMBULATORY NON FORMULARY MEDICATION, fluticasone, pravastatin, levothyroxine, sertraline, donepezil, gabapentin, memantine, Na Sulfate-K Sulfate-Mg Sulf, VITAMIN D PO, Lactobacillus-Inulin (CULTURELLE  DIGESTIVE DAILY PO), CALCIUM PO, loratadine, and Cranberry.  Meds ordered this encounter  Medications  . midodrine (PROAMATINE) 2.5 MG tablet    Sig: Take 1 tablet (2.5 mg total) by mouth 3 (three) times daily with meals.    Dispense:  90 tablet    Refill:  1    Problem List Items Addressed This Visit      Unprioritized   Idiopathic hypotension - Primary    Inc midodrine to tid        Relevant Medications   midodrine (PROAMATINE) 2.5 MG tablet   Other Relevant Orders   Basic metabolic panel    Other Visit Diagnoses    History of UTI       Relevant Orders   POCT Urinalysis Dipstick (Automated)   Basic metabolic panel    pt was unable to leave urine while in office  Her husband was given a specimen cup and will drop off a urine tomorrow   Follow-up: Return in about 3 months (around 05/18/2020), or if symptoms worsen or fail to improve, for hypertension, hyperlipidemia.  Ann Held, DO

## 2020-02-16 NOTE — Patient Instructions (Signed)

## 2020-02-16 NOTE — Assessment & Plan Note (Signed)
Inc midodrine to tid

## 2020-02-17 ENCOUNTER — Other Ambulatory Visit (INDEPENDENT_AMBULATORY_CARE_PROVIDER_SITE_OTHER): Payer: Medicare HMO

## 2020-02-17 DIAGNOSIS — I951 Orthostatic hypotension: Secondary | ICD-10-CM | POA: Diagnosis not present

## 2020-02-17 DIAGNOSIS — E785 Hyperlipidemia, unspecified: Secondary | ICD-10-CM | POA: Diagnosis not present

## 2020-02-17 DIAGNOSIS — Z87440 Personal history of urinary (tract) infections: Secondary | ICD-10-CM

## 2020-02-17 DIAGNOSIS — N390 Urinary tract infection, site not specified: Secondary | ICD-10-CM | POA: Diagnosis not present

## 2020-02-17 DIAGNOSIS — H409 Unspecified glaucoma: Secondary | ICD-10-CM | POA: Diagnosis not present

## 2020-02-17 DIAGNOSIS — G300 Alzheimer's disease with early onset: Secondary | ICD-10-CM | POA: Diagnosis not present

## 2020-02-17 DIAGNOSIS — E039 Hypothyroidism, unspecified: Secondary | ICD-10-CM | POA: Diagnosis not present

## 2020-02-17 DIAGNOSIS — R69 Illness, unspecified: Secondary | ICD-10-CM | POA: Diagnosis not present

## 2020-02-17 DIAGNOSIS — R55 Syncope and collapse: Secondary | ICD-10-CM | POA: Diagnosis not present

## 2020-02-17 DIAGNOSIS — D649 Anemia, unspecified: Secondary | ICD-10-CM | POA: Diagnosis not present

## 2020-02-17 LAB — POC URINALSYSI DIPSTICK (AUTOMATED)
Bilirubin, UA: NEGATIVE
Blood, UA: NEGATIVE
Glucose, UA: NEGATIVE
Ketones, UA: NEGATIVE
Leukocytes, UA: NEGATIVE
Nitrite, UA: NEGATIVE
Protein, UA: NEGATIVE
Spec Grav, UA: 1.015 (ref 1.010–1.025)
Urobilinogen, UA: 0.2 E.U./dL
pH, UA: 7 (ref 5.0–8.0)

## 2020-02-17 LAB — BASIC METABOLIC PANEL
BUN: 20 mg/dL (ref 6–23)
CO2: 33 mEq/L — ABNORMAL HIGH (ref 19–32)
Calcium: 9.2 mg/dL (ref 8.4–10.5)
Chloride: 104 mEq/L (ref 96–112)
Creatinine, Ser: 1.06 mg/dL (ref 0.40–1.20)
GFR: 50.67 mL/min — ABNORMAL LOW (ref >60.00)
Glucose, Bld: 119 mg/dL — ABNORMAL HIGH (ref 70–99)
Potassium: 4.9 mEq/L (ref 3.5–5.1)
Sodium: 141 mEq/L (ref 135–145)

## 2020-02-17 NOTE — Progress Notes (Signed)
No charge. Pt unable to void at visit yesterday and returned sample today.

## 2020-02-20 ENCOUNTER — Telehealth: Payer: Self-pay | Admitting: Family Medicine

## 2020-02-20 NOTE — Telephone Encounter (Signed)
Caller:Katherine ([place]) Call back phone number: [phone]  Patient was refer from [facility] for PT.   PT Frequency:   2 x time a week for 2 weeks 1 time a week for 2 weeks  Starting this week  Verbal order need it.

## 2020-02-20 NOTE — Telephone Encounter (Signed)
Verbal given 

## 2020-02-20 NOTE — Telephone Encounter (Signed)
Always ok to give verbal for this --- and just send me note

## 2020-02-20 NOTE — Telephone Encounter (Signed)
Please advise 

## 2020-02-22 ENCOUNTER — Other Ambulatory Visit: Payer: Medicare HMO

## 2020-02-22 ENCOUNTER — Encounter: Payer: Medicare HMO | Admitting: Internal Medicine

## 2020-02-22 DIAGNOSIS — R69 Illness, unspecified: Secondary | ICD-10-CM | POA: Diagnosis not present

## 2020-02-22 DIAGNOSIS — R55 Syncope and collapse: Secondary | ICD-10-CM | POA: Diagnosis not present

## 2020-02-22 DIAGNOSIS — G300 Alzheimer's disease with early onset: Secondary | ICD-10-CM | POA: Diagnosis not present

## 2020-02-22 DIAGNOSIS — H409 Unspecified glaucoma: Secondary | ICD-10-CM | POA: Diagnosis not present

## 2020-02-22 DIAGNOSIS — E039 Hypothyroidism, unspecified: Secondary | ICD-10-CM | POA: Diagnosis not present

## 2020-02-22 DIAGNOSIS — Z87440 Personal history of urinary (tract) infections: Secondary | ICD-10-CM | POA: Diagnosis not present

## 2020-02-22 DIAGNOSIS — D649 Anemia, unspecified: Secondary | ICD-10-CM | POA: Diagnosis not present

## 2020-02-22 DIAGNOSIS — I951 Orthostatic hypotension: Secondary | ICD-10-CM | POA: Diagnosis not present

## 2020-02-22 DIAGNOSIS — E785 Hyperlipidemia, unspecified: Secondary | ICD-10-CM | POA: Diagnosis not present

## 2020-02-22 DIAGNOSIS — N390 Urinary tract infection, site not specified: Secondary | ICD-10-CM | POA: Diagnosis not present

## 2020-02-24 DIAGNOSIS — E039 Hypothyroidism, unspecified: Secondary | ICD-10-CM | POA: Diagnosis not present

## 2020-02-24 DIAGNOSIS — R69 Illness, unspecified: Secondary | ICD-10-CM | POA: Diagnosis not present

## 2020-02-24 DIAGNOSIS — N390 Urinary tract infection, site not specified: Secondary | ICD-10-CM | POA: Diagnosis not present

## 2020-02-24 DIAGNOSIS — G300 Alzheimer's disease with early onset: Secondary | ICD-10-CM | POA: Diagnosis not present

## 2020-02-24 DIAGNOSIS — R55 Syncope and collapse: Secondary | ICD-10-CM | POA: Diagnosis not present

## 2020-02-24 DIAGNOSIS — D649 Anemia, unspecified: Secondary | ICD-10-CM | POA: Diagnosis not present

## 2020-02-24 DIAGNOSIS — I951 Orthostatic hypotension: Secondary | ICD-10-CM | POA: Diagnosis not present

## 2020-02-24 DIAGNOSIS — Z87440 Personal history of urinary (tract) infections: Secondary | ICD-10-CM | POA: Diagnosis not present

## 2020-02-24 DIAGNOSIS — E785 Hyperlipidemia, unspecified: Secondary | ICD-10-CM | POA: Diagnosis not present

## 2020-02-24 DIAGNOSIS — H409 Unspecified glaucoma: Secondary | ICD-10-CM | POA: Diagnosis not present

## 2020-02-29 DIAGNOSIS — E039 Hypothyroidism, unspecified: Secondary | ICD-10-CM | POA: Diagnosis not present

## 2020-02-29 DIAGNOSIS — R69 Illness, unspecified: Secondary | ICD-10-CM | POA: Diagnosis not present

## 2020-02-29 DIAGNOSIS — H409 Unspecified glaucoma: Secondary | ICD-10-CM | POA: Diagnosis not present

## 2020-02-29 DIAGNOSIS — D649 Anemia, unspecified: Secondary | ICD-10-CM | POA: Diagnosis not present

## 2020-02-29 DIAGNOSIS — Z87440 Personal history of urinary (tract) infections: Secondary | ICD-10-CM | POA: Diagnosis not present

## 2020-02-29 DIAGNOSIS — I951 Orthostatic hypotension: Secondary | ICD-10-CM | POA: Diagnosis not present

## 2020-02-29 DIAGNOSIS — E785 Hyperlipidemia, unspecified: Secondary | ICD-10-CM | POA: Diagnosis not present

## 2020-02-29 DIAGNOSIS — G300 Alzheimer's disease with early onset: Secondary | ICD-10-CM | POA: Diagnosis not present

## 2020-02-29 DIAGNOSIS — N390 Urinary tract infection, site not specified: Secondary | ICD-10-CM | POA: Diagnosis not present

## 2020-02-29 DIAGNOSIS — R55 Syncope and collapse: Secondary | ICD-10-CM | POA: Diagnosis not present

## 2020-03-01 DIAGNOSIS — H10413 Chronic giant papillary conjunctivitis, bilateral: Secondary | ICD-10-CM | POA: Diagnosis not present

## 2020-03-01 DIAGNOSIS — H401132 Primary open-angle glaucoma, bilateral, moderate stage: Secondary | ICD-10-CM | POA: Diagnosis not present

## 2020-03-01 DIAGNOSIS — Z961 Presence of intraocular lens: Secondary | ICD-10-CM | POA: Diagnosis not present

## 2020-03-02 DIAGNOSIS — H409 Unspecified glaucoma: Secondary | ICD-10-CM | POA: Diagnosis not present

## 2020-03-02 DIAGNOSIS — N390 Urinary tract infection, site not specified: Secondary | ICD-10-CM | POA: Diagnosis not present

## 2020-03-02 DIAGNOSIS — R69 Illness, unspecified: Secondary | ICD-10-CM | POA: Diagnosis not present

## 2020-03-02 DIAGNOSIS — E785 Hyperlipidemia, unspecified: Secondary | ICD-10-CM | POA: Diagnosis not present

## 2020-03-02 DIAGNOSIS — R55 Syncope and collapse: Secondary | ICD-10-CM | POA: Diagnosis not present

## 2020-03-02 DIAGNOSIS — E039 Hypothyroidism, unspecified: Secondary | ICD-10-CM | POA: Diagnosis not present

## 2020-03-02 DIAGNOSIS — Z87440 Personal history of urinary (tract) infections: Secondary | ICD-10-CM | POA: Diagnosis not present

## 2020-03-02 DIAGNOSIS — I951 Orthostatic hypotension: Secondary | ICD-10-CM | POA: Diagnosis not present

## 2020-03-02 DIAGNOSIS — G300 Alzheimer's disease with early onset: Secondary | ICD-10-CM | POA: Diagnosis not present

## 2020-03-02 DIAGNOSIS — D649 Anemia, unspecified: Secondary | ICD-10-CM | POA: Diagnosis not present

## 2020-03-23 ENCOUNTER — Other Ambulatory Visit: Payer: Self-pay | Admitting: Family Medicine

## 2020-03-23 DIAGNOSIS — I950 Idiopathic hypotension: Secondary | ICD-10-CM

## 2020-04-06 ENCOUNTER — Telehealth: Payer: Self-pay | Admitting: Family Medicine

## 2020-04-06 NOTE — Telephone Encounter (Signed)
Caller: Vicente Males [facility]) Call back # [phone]  Vicente Males states patient blood pressure seating down was 98/56, standing 70/50, 66/46. She was able to get patient to drink some water with some improvement in blood pressure. Vicente Males wants to know if she could take midodrine now instead of this afternoon.

## 2020-04-09 NOTE — Telephone Encounter (Signed)
Yes and if we need to increase med we can

## 2020-04-09 NOTE — Telephone Encounter (Signed)
I called Michelle [name] back regarding blood pressures. I had to leave a VM.

## 2020-04-10 NOTE — Telephone Encounter (Signed)
New message:   Michelle [name] is calling and would like to speak with Michelle [name] in reference to the pt. Please advise.

## 2020-04-10 NOTE — Telephone Encounter (Signed)
Spoke with Michelle [name] and advised below recommendations. Michelle [name] confirmed.

## 2020-04-10 NOTE — Telephone Encounter (Signed)
If she is not having dizziness or balance issues we can leave it alone

## 2020-04-10 NOTE — Telephone Encounter (Signed)
Spoke with Michelle [name] this morning. She states pts husband checked Bps over the weekend and her Bps were 98/67 and 100/70 without dizziness. Do you want to increase meds? Please advise

## 2020-04-11 NOTE — Telephone Encounter (Signed)
Left VM. Advised to call back and to speak with any available CMA since I am not in the office.

## 2020-04-20 ENCOUNTER — Other Ambulatory Visit: Payer: Self-pay

## 2020-04-20 ENCOUNTER — Telehealth: Payer: Self-pay | Admitting: Family Medicine

## 2020-04-20 MED ORDER — MIDODRINE HCL 5 MG PO TABS: 5.0000 mg | ORAL_TABLET | Freq: Three times a day (TID) | ORAL | 2 refills | Status: DC

## 2020-04-20 NOTE — Telephone Encounter (Signed)
Increase midodrine to 5 mg tid #90 2 refills

## 2020-04-20 NOTE — Telephone Encounter (Signed)
Med sent to pharmacy. LVM with Vicente Males regarding change and spoke with pts husband to advise about change in meds. He verbalized understanding.

## 2020-04-20 NOTE — Telephone Encounter (Signed)
Please advise 

## 2020-04-20 NOTE — Telephone Encounter (Signed)
Caller: Vicente Males ([facility]) Call back # [phone]  Vicente Males from [facility] is calling to report the patient became dizzy, lightheaded patient blood pressure seating 82/50 68 heart rate. Gave patient some water took blood pressure again 90/56. Patient husbands states this type of situation happens at least once a week, however, this week it has happened twice.   Please call Vicente Males back with advise

## 2020-04-27 ENCOUNTER — Telehealth: Payer: Self-pay

## 2020-04-27 NOTE — Telephone Encounter (Signed)
The husband of pt is wanting to know if the pt needs her colonoscopy rescheduled.  Please advise.  CB # for Vicente Males is [phone].

## 2020-04-30 NOTE — Telephone Encounter (Signed)
With her blood pressure being low? Please advise

## 2020-04-30 NOTE — Telephone Encounter (Signed)
Is the bp not any better with increasing the medication? I would say she should reschedule but he should call GI

## 2020-05-01 NOTE — Telephone Encounter (Signed)
LVM with Michelle [name]

## 2020-05-02 ENCOUNTER — Telehealth: Payer: Self-pay | Admitting: Internal Medicine

## 2020-05-02 NOTE — Telephone Encounter (Signed)
Per previous note in June Dr. Henrene [name] had stated that there were only small polyps found on previous colon and that if her health were declining surveillance colon would not be necessary.

## 2020-05-02 NOTE — Telephone Encounter (Signed)
Michelle [name] called and said pt's last four BP readings were  105/51  102/55 119/57 124/59  She hasn't been busy or stumbling around like blood pressure was low. She said if you has any question you can give her a call back.

## 2020-05-02 NOTE — Telephone Encounter (Signed)
FYI

## 2020-05-03 NOTE — Telephone Encounter (Signed)
noted 

## 2020-05-03 NOTE — Telephone Encounter (Signed)
As long as she is not dizzy/ falling etc ----  i'll leave the dose the same--- if they feel the dose needs to be increased let us know

## 2020-05-08 ENCOUNTER — Other Ambulatory Visit: Payer: Self-pay | Admitting: Family Medicine

## 2020-05-08 DIAGNOSIS — I950 Idiopathic hypotension: Secondary | ICD-10-CM

## 2020-05-23 ENCOUNTER — Other Ambulatory Visit: Payer: Self-pay | Admitting: Family Medicine

## 2020-05-23 DIAGNOSIS — J302 Other seasonal allergic rhinitis: Secondary | ICD-10-CM

## 2020-05-31 ENCOUNTER — Other Ambulatory Visit: Payer: Self-pay

## 2020-05-31 ENCOUNTER — Ambulatory Visit (INDEPENDENT_AMBULATORY_CARE_PROVIDER_SITE_OTHER): Payer: Medicare HMO

## 2020-05-31 DIAGNOSIS — Z23 Encounter for immunization: Secondary | ICD-10-CM

## 2020-05-31 NOTE — Progress Notes (Signed)
Michelle [name] is a 74 y.o. female presents to the office today for high dose fuinjections,  Flu shot was administered im today. Patient tolerated injection.   [name]

## 2020-06-29 ENCOUNTER — Encounter: Payer: Self-pay | Admitting: Neurology

## 2020-06-29 ENCOUNTER — Ambulatory Visit: Payer: Medicare HMO | Admitting: Neurology

## 2020-06-29 ENCOUNTER — Other Ambulatory Visit: Payer: Self-pay

## 2020-06-29 VITALS — BP 138/71 | HR 75 | Ht 64.0 in | Wt 131.4 lb

## 2020-06-29 DIAGNOSIS — F0280 Dementia in other diseases classified elsewhere without behavioral disturbance: Secondary | ICD-10-CM

## 2020-06-29 DIAGNOSIS — G301 Alzheimer's disease with late onset: Secondary | ICD-10-CM | POA: Diagnosis not present

## 2020-06-29 DIAGNOSIS — G44209 Tension-type headache, unspecified, not intractable: Secondary | ICD-10-CM | POA: Diagnosis not present

## 2020-06-29 DIAGNOSIS — R69 Illness, unspecified: Secondary | ICD-10-CM | POA: Diagnosis not present

## 2020-06-29 MED ORDER — GABAPENTIN 300 MG PO CAPS: ORAL_CAPSULE | ORAL | 3 refills | Status: DC

## 2020-06-29 MED ORDER — MEMANTINE HCL 10 MG PO TABS: 10.0000 mg | ORAL_TABLET | Freq: Two times a day (BID) | ORAL | 3 refills | Status: DC

## 2020-06-29 MED ORDER — DONEPEZIL HCL 10 MG PO TABS: ORAL_TABLET | ORAL | 3 refills | Status: DC

## 2020-06-29 MED ORDER — SERTRALINE HCL 100 MG PO TABS
ORAL_TABLET | ORAL | 3 refills | Status: DC
Start: 2020-06-29 — End: 2021-02-25

## 2020-06-29 NOTE — Progress Notes (Signed)
NEUROLOGY FOLLOW UP OFFICE NOTE  Michelle [name] [id number] 01/05/1946  HISTORY OF PRESENT ILLNESS: I had the pleasure of seeing Michelle [name] in follow-up in the neurology clinic on 06/29/2020.  The patient was last seen 74 months ago for Alzheimer's dementia. She is again accompanied by her husband who helps supplement the history today. Since her last visit, her husband reports continued progression of symptoms on a daily basis, "not drastic." Last week she could not recall their daughters' names and needed prompting. For 3 days last week she did not remember who he was and calls him "Daddy." She misplaces things frequency, she has misplaced a diamond ring and her hearing aid. She gets frustrated easily. She has more difficulty following instructions. Her husband manages medications, finances, meals. She is independent with dressing and bathing. A nurse comes every 1-2 weeks. She is having hallucinations, he is not sure if she is dreaming, he asks who she is talking to and she says "my friends," pointing to the [employer]. There have been 3 instances where she has reported 3 little girls standing in the room. Sleep is good, no wandering behavior. She continues to report headaches, around 2-3 weeks ago she had a headache all week. She is on Gabapentin 300mg  BID. She had a fall in the bathroom in June, legs were noodle-like, she collapsed a few times. She was admitted for 3 days with dehydration, hypotension. Midodrine dose was increased. He tries to get her up out of bed but she likes to stay in bed. They went to the beach for 2 weeks and she enjoyed it. She is on Donepezil 10mg  daily, Memantine 10mg  BID, and Sertraline 100mg  daily without side effects.  Last MRI brain with and without contrast done 04/2019 for worsening headaches and confusion did not show any acute changes, there was minimal change from last MRI in 2018 with mild to moderate diffuse atrophy, mild chronic microvascular disease. The  meningioma along the dorsal aspect of the clivus to the right of midline was not significantly changed, 1.9 by 1.0 x 2.5 cm, with mass effect on pons and midbrain unchanged.   History on Initial Assessment 04/02/2016: This is a pleasant 74 yo RH woman with a history of hypertension, hyperlipidemia, hypothyroidism, who presented for evaluation of memory loss. Her daughter started noticing memory changes a year ago. The patient states she can tell a difference in her memory, but that her family notices it more and "they may think I'm in denial." Her family has noticed worsening over the past 6 months, she repeats herself several times, sometimes having the same conversation four times within an hour. Her husband reports that she asked him repeatedly last 37th of July weekend what he was wearing to go home. She misplaces things frequently in the house. She left the stove on one time boiling water. She takes longer to get ready to go out. She denies getting lost driving, no missed bills or medications. 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 Her family also expressed concern about personality changes, she used to be laidback but now her patience is not there. She is cussing a lot more easily now. She doesn't want to do things she used to like. She does not like going out, and stayed in the room while at the beach more than with her family. No paranoia. No difficulties with ADLs. There is no family history of  dementia, she denies any history of head injuries, alcohol intake. She has been having dull right frontal headaches for the past 6 months attributed to her sinuses, no associated nausea/vomiting. She denies any dizziness, diplopia, dysarthria/dysphagia, focal numbness/tingling/weakness, bowel/bladder dysfunction. She has occasional neck and left shoulder pain. No falls.   She had an MMSE done with her PCP this month, MMSE 25/30. TSH and  B12 unremarkable. She has been unable to do the MRI brain.  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 Post-contrast MRI was ordered, which again showed the 40mm enhancing dural-based mass posterior to the clivus on the right. There is mass effect on the anterior pons on the right which is mildly compressed. The mass is touching the basilar causing mild displacement to the left. There is no definite invasion of the sella. It may be touching the cisternal segment of the trigeminal nerve.   PAST MEDICAL HISTORY: Past Medical History:  Diagnosis Date  . Adenomatous colon polyp   . Allergy   . Dementia ([place])   . GERD (gastroesophageal reflux disease)   . Glaucoma   . Hx of hearing loss    bilateral hearing aids  . Hyperlipidemia   . Hypertension   . IBS (irritable bowel syndrome)   . Low back pain   . Thyroid disease     MEDICATIONS: Current Outpatient Medications on File Prior to Visit  Medication Sig Dispense Refill  . ALPHAGAN P 0.1 % SOLN Place 1 drop into both eyes in the morning and at bedtime.     . AMBULATORY NON FORMULARY MEDICATION Take 1 tablet by mouth daily. IBgard: Takes prn     . aspirin 81 MG tablet Take 81 mg by mouth daily.     Marland Kitchen CALCIUM PO Take 1 tablet by mouth every evening.     . Cranberry 1000 MG CAPS Take 1,000 mg by mouth daily.    Marland Kitchen donepezil (ARICEPT) 10 MG tablet Take 1 tablet daily (Patient taking differently: Take 10 mg by mouth at bedtime. ) 90 tablet 3  . fluticasone (FLONASE) 50 MCG/ACT nasal spray Place 2 sprays into both nostrils daily. (Patient taking differently: Place 2 sprays into both nostrils daily as needed for allergies. ) 16 g 5  . gabapentin (NEURONTIN) 300 MG capsule Take 1 cap twice a day (Patient taking differently: Take 300 mg by mouth 2 (two) times daily. Take 1 cap twice a day) 180 capsule 3  .  Lactobacillus-Inulin (CULTURELLE DIGESTIVE DAILY PO) Take 1 tablet by mouth daily.     Marland Kitchen levothyroxine (SYNTHROID) 50 MCG tablet TAKE 1 TABLET BY MOUTH EVERY DAY (Patient taking differently: Take 50 mcg by mouth daily before breakfast. ) 90 tablet 3  . loratadine (CLARITIN) 10 MG tablet TAKE 1 TABLET BY MOUTH EVERY DAY 90 tablet 1  . memantine (NAMENDA) 10 MG tablet Take 1 tablet (10 mg total) by mouth 2 (two) times daily. 180 tablet 3  . midodrine (PROAMATINE) 5 MG tablet Take 1 tablet (5 mg total) by mouth 3 (three) times daily with meals. 90 tablet 2  . Multiple Vitamin (MULTIVITAMIN) tablet Take 1 tablet by mouth daily.    . Na Sulfate-K Sulfate-Mg Sulf 17.5-3.13-1.6 GM/177ML SOLN Suprep (no substitutions)-TAKE AS DIRECTED. 354 mL 0  . pravastatin (PRAVACHOL) 20 MG tablet TAKE 1 TABLET BY MOUTH EVERY DAY (Patient taking differently: Take 20  mg by mouth daily. ) 90 tablet 3  . sertraline (ZOLOFT) 100 MG tablet Take 1 tablet (100 mg total) by mouth daily. 90 tablet 3  . VITAMIN D PO Take 1,000 Units by mouth daily.      No current facility-administered medications on file prior to visit.    ALLERGIES: No Known Allergies  FAMILY HISTORY: Family History  Problem Relation Age of Onset  . Macular degeneration Father   . Prostate cancer Father   . Hypertension Father   . Cancer Father        prostate  . Glaucoma Mother   . Emphysema Mother   . Hypertension Mother   . Other Brother        Brain tumor  . Cancer Brother 40       brain tumor  . Breast cancer Maternal Grandmother   . Prostate cancer Brother   . Colon cancer Neg Hx   . Colon polyps Neg Hx   . Esophageal cancer Neg Hx   . Rectal cancer Neg Hx   . Stomach cancer Neg Hx     SOCIAL HISTORY: Social History   Socioeconomic History  . Marital status: Married    Spouse name: Michelle [name], "Butch"  . Number of children: 2  . Years of education: Not on file  . Highest education level: Not on file  Occupational History  .  Occupation: retired    Fish farm manager: UNEMPLOYED  Tobacco Use  . Smoking status: Never Smoker  . Smokeless tobacco: Never Used  Vaping Use  . Vaping Use: Never used  Substance and Sexual Activity  . Alcohol use: No  . Drug use: No  . Sexual activity: Yes    Partners: Male    Birth control/protection: Surgical  Other Topics Concern  . Not on file  Social History Narrative   Exercise--no   Lives with husband   One story home   Right handed   Social Determinants of Health   Financial Resource Strain:   . Difficulty of Paying Living Expenses: Not on file  Food Insecurity:   . Worried About Charity fundraiser in the Last Year: Not on file  . Ran Out of Food in the Last Year: Not on file  Transportation Needs:   . Lack of Transportation (Medical): Not on file  . Lack of Transportation (Non-Medical): Not on file  Physical Activity:   . Days of Exercise per Week: Not on file  . Minutes of Exercise per Session: Not on file  Stress:   . Feeling of Stress : Not on file  Social Connections:   . Frequency of Communication with Friends and Family: Not on file  . Frequency of Social Gatherings with Friends and Family: Not on file  . Attends Religious Services: Not on file  . Active Member of Clubs or Organizations: Not on file  . Attends Archivist Meetings: Not on file  . Marital Status: Not on file  Intimate Partner Violence:   . Fear of Current or Ex-Partner: Not on file  . Emotionally Abused: Not on file  . Physically Abused: Not on file  . Sexually Abused: Not on file     PHYSICAL EXAM: Vitals:   06/29/20 1548  BP: 138/71  Pulse: 75  SpO2: 99%   General: No acute distress Head:  Normocephalic/atraumatic Skin/Extremities: No rash, no edema Neurological Exam: alert and oriented to person. No aphasia or dysarthria. Fund of knowledge is reduced.  Recent and remote memory are  impaired, 0/3 delayed recall.  Attention and concentration are reduced. More difficulty  following instructions. Cranial nerves: Pupils equal, round. Extraocular movements intact with no nystagmus. Visual fields full.  No facial asymmetry.  Motor: Bulk and tone normal, muscle strength 5/5 throughout with no pronator drift.   Finger to nose testing intact.  Gait narrow-based and steady, no ataxia.   IMPRESSION: This is a pleasant 75 yo RH woman with a history of hypertension, hyperlipidemia, hypothyroidism, with moderate dementia, likely due to Alzheimer's disease. There is progressive decline with increasing behavioral changes. Increase Sertraline to 150mg  daily. Continue Donepezil 10mg  daily and Memantine 10mg  BID. Continue Gabapentin 300mg  BID for headache prophylaxis. Continue 24/7 supervision. Increased physical activity discussed with patient.Caregiver support provided. Follow-up in 6-8 months, they know to call for any changes.    Thank you for allowing me to participate in her care.  Please do not hesitate to call for any questions or concerns.   Ellouise Newer, M.D.   CC: Dr. Cheri Rous

## 2020-06-29 NOTE — Patient Instructions (Signed)
1. Increase Sertraline 100mg : Take 1 and 1/2 tablet daily  2. Continue all your medications  3. Continue 24/7 supervision  4. Follow-up in 6-8 months, call for any changes

## 2020-07-13 ENCOUNTER — Telehealth: Payer: Self-pay | Admitting: Family Medicine

## 2020-07-13 NOTE — Telephone Encounter (Signed)
Caller name: [facility] Call back number: [phone]  Family reports patient having burning with urination along with frequency. Tammy would like order for ua culture

## 2020-07-13 NOTE — Telephone Encounter (Signed)
agree

## 2020-07-13 NOTE — Telephone Encounter (Signed)
Verbal given 

## 2020-07-16 ENCOUNTER — Encounter: Payer: Self-pay | Admitting: Family Medicine

## 2020-07-16 DIAGNOSIS — N390 Urinary tract infection, site not specified: Secondary | ICD-10-CM | POA: Diagnosis not present

## 2020-07-20 DIAGNOSIS — H401132 Primary open-angle glaucoma, bilateral, moderate stage: Secondary | ICD-10-CM | POA: Diagnosis not present

## 2020-07-20 DIAGNOSIS — Z961 Presence of intraocular lens: Secondary | ICD-10-CM | POA: Diagnosis not present

## 2020-07-20 DIAGNOSIS — H10413 Chronic giant papillary conjunctivitis, bilateral: Secondary | ICD-10-CM | POA: Diagnosis not present

## 2020-07-23 ENCOUNTER — Other Ambulatory Visit: Payer: Self-pay | Admitting: Family Medicine

## 2020-07-23 ENCOUNTER — Encounter: Payer: Self-pay | Admitting: Family Medicine

## 2020-07-23 DIAGNOSIS — N3000 Acute cystitis without hematuria: Secondary | ICD-10-CM

## 2020-07-23 MED ORDER — AMOXICILLIN-POT CLAVULANATE 500-125 MG PO TABS: ORAL_TABLET | ORAL | 0 refills | Status: DC

## 2020-08-10 ENCOUNTER — Telehealth: Payer: Self-pay | Admitting: Family Medicine

## 2020-08-10 ENCOUNTER — Encounter: Payer: Self-pay | Admitting: Family Medicine

## 2020-08-10 ENCOUNTER — Telehealth: Payer: Self-pay | Admitting: Neurology

## 2020-08-10 DIAGNOSIS — N390 Urinary tract infection, site not specified: Secondary | ICD-10-CM | POA: Diagnosis not present

## 2020-08-10 NOTE — Telephone Encounter (Signed)
Called home health nurse Vicente Males back

## 2020-08-10 NOTE — Telephone Encounter (Signed)
Spoke with Vicente Males. Vicente Males states pt is not acting like herself. Pt was laying in bed and was asleep when she arrived. Vicente Males reports not normal for patient. Vicente Males and family report pt has increased confusion and sleeping more. Husband reports dark urine and strong smell. Advised if able to get urine for a UA and culture. If unable to have patient evaluated at [facility] for UTI.

## 2020-08-10 NOTE — Telephone Encounter (Signed)
Follow up on that next week to see exactly what they are asking and then send back to Dr. Amparo Bristol inbox.  Thank you!

## 2020-08-10 NOTE — Telephone Encounter (Signed)
For what reason do they want to increase sertraline?

## 2020-08-10 NOTE — Telephone Encounter (Signed)
Vicente Males [facility]) [phone]  Vicente Males states patient seems more confuse than normal. Patient complaining of burning and pain with urination.Vicente Males states patient was diagnoses with a UTI on 11/11 took a 7 days antibiotic. Vicente Males is wondering if maybe infection has not clear out

## 2020-08-10 NOTE — Telephone Encounter (Signed)
Left VM with Michelle [name]. Advised for another UA/Culture .

## 2020-08-11 NOTE — Telephone Encounter (Signed)
Agree with UA --- or UC Please check on pt Monday

## 2020-08-13 NOTE — Telephone Encounter (Signed)
Left message with Vicente Males home health nurse to call the office back

## 2020-08-16 NOTE — Telephone Encounter (Signed)
I'm sorry.  A decrease in what?  Agitation?  Memory?  If just memory, no change in meds.  If pt  is more agitated then potentially we can increase med.

## 2020-08-16 NOTE — Telephone Encounter (Signed)
[facility] nurse called back left a voice mail on nurse phone stated that pt husband at her last visit last week had seen a decrease in the pt. They did a UA it was clear , Stated that with the increase in sertraline it has not helped asking if it needed to be increase or if medication needed to be changed.

## 2020-08-16 NOTE — Telephone Encounter (Signed)
Michelle [name] with home health, no answer again, left a voice mail for her to call the office back

## 2020-08-17 ENCOUNTER — Telehealth: Payer: Self-pay | Admitting: Family Medicine

## 2020-08-17 NOTE — Telephone Encounter (Signed)
Spoke with Mr Scherman advised for Mrs Worthley to Go back to sertraline 100 mg, 1 tablet daily from 1.5 tablets daily and see if that helps. Mr Boza verbalized understanding. Will call back in 2 weeks with an update

## 2020-08-17 NOTE — Telephone Encounter (Signed)
Go back to sertraline 100 mg, 1 tablet daily from 1.5 tablets daily and see if that helps

## 2020-08-17 NOTE — Telephone Encounter (Signed)
Spoke with Michelle [name] husband Dr Delice Lesch had increased the Zoloft to try to help increase Michelle [name] activity, Michelle [name] husband stated he thinks it has done the opposite he is having a harder time getting her out of the bed after the increase in the Zoloft he is asking if they should cut it back down or try a different medication to help try and increase her activity just a little

## 2020-08-17 NOTE — Telephone Encounter (Signed)
LVM--to call the office back. 

## 2020-08-17 NOTE — Progress Notes (Signed)
  Chronic Care Management   Note  08/17/2020 Name: Michelle [name] MRN: [id number] DOB: Jun 13, 1946  Michelle [name] is a 74 y.o. year old female who is a primary care patient of Ann Held, DO. I reached out to [name] by phone today in response to a referral sent by Michelle [name]'s PCP, Ann Held, DO.   Michelle [name] was given information about Chronic Care Management services today including:  1. CCM service includes personalized support from designated clinical staff supervised by her physician, including individualized plan of care and coordination with other care providers 2. 24/7 contact phone numbers for assistance for urgent and routine care needs. 3. Service will only be billed when office clinical staff spend 20 minutes or more in a month to coordinate care. 4. Only one practitioner may furnish and bill the service in a calendar month. 5. The patient may stop CCM services at any time (effective at the end of the month) by phone call to the office staff.   Patient agreed to services and verbal consent obtained.   Follow up plan:   Michelle [name]

## 2020-08-19 ENCOUNTER — Other Ambulatory Visit: Payer: Self-pay | Admitting: Family Medicine

## 2020-08-21 ENCOUNTER — Telehealth: Payer: Self-pay | Admitting: Family Medicine

## 2020-08-21 MED ORDER — NITROFURANTOIN MACROCRYSTAL 50 MG PO CAPS: 50.0000 mg | ORAL_CAPSULE | Freq: Every day | ORAL | 5 refills | Status: DC

## 2020-08-21 NOTE — Telephone Encounter (Signed)
Macrodantin 50 mg qhs #30  5 refills

## 2020-08-21 NOTE — Telephone Encounter (Signed)
Per Vicente Males with [facility], patient's family would like to start patient on UTI  preventive medication ..d mannose.  and Vicente Males would like an order if Dr Etter Sjogren agrees to start patient on medication   Please Advise  Call Back for Vicente Males -[phone]

## 2020-08-21 NOTE — Telephone Encounter (Signed)
Medication sent. Called Anna back and LVM

## 2020-08-21 NOTE — Telephone Encounter (Signed)
Please advise 

## 2020-09-26 ENCOUNTER — Telehealth: Payer: Self-pay | Admitting: Pharmacist

## 2020-09-26 ENCOUNTER — Other Ambulatory Visit: Payer: Self-pay | Admitting: Family Medicine

## 2020-09-26 DIAGNOSIS — E039 Hypothyroidism, unspecified: Secondary | ICD-10-CM

## 2020-09-26 NOTE — Progress Notes (Signed)
Chronic Care Management Pharmacy Assistant   Name: Michelle [name]  MRN: [id number] DOB: [birth year]  Reason for Encounter: Initial Questions   PCP : Ann Held, DO  Allergies:  No Known Allergies  Medications: Outpatient Encounter Medications as of 09/26/2020  Medication Sig Note  . levothyroxine (SYNTHROID) 50 MCG tablet Take 1 tablet (50 mcg total) by mouth daily before breakfast.   . ALPHAGAN P 0.1 % SOLN Place 1 drop into both eyes in the morning and at bedtime.    . AMBULATORY NON FORMULARY MEDICATION Take 1 tablet by mouth daily. IBgard: Takes prn    . amoxicillin-clavulanate (AUGMENTIN) 500-125 MG tablet 1 po bid x 7 days   . aspirin 81 MG tablet Take 81 mg by mouth daily.    Marland Kitchen CALCIUM PO Take 1 tablet by mouth 2 (two) times daily.    . Cranberry 1000 MG CAPS Take 1,000 mg by mouth daily.   Marland Kitchen donepezil (ARICEPT) 10 MG tablet Take 1 tablet daily   . fluticasone (FLONASE) 50 MCG/ACT nasal spray Place 2 sprays into both nostrils daily. (Patient taking differently: Place 2 sprays into both nostrils daily as needed for allergies. )   . gabapentin (NEURONTIN) 300 MG capsule Take 1 cap twice a day   . Lactobacillus-Inulin (CULTURELLE DIGESTIVE DAILY PO) Take 1 tablet by mouth daily.    Marland Kitchen loratadine (CLARITIN) 10 MG tablet TAKE 1 TABLET BY MOUTH EVERY DAY   . memantine (NAMENDA) 10 MG tablet Take 1 tablet (10 mg total) by mouth 2 (two) times daily.   . midodrine (PROAMATINE) 5 MG tablet TAKE 1 TABLET (5 MG TOTAL) BY MOUTH 3 (THREE) TIMES DAILY WITH MEALS.   . Multiple Vitamin (MULTIVITAMIN) tablet Take 1 tablet by mouth daily.   . Na Sulfate-K Sulfate-Mg Sulf 17.5-3.13-1.6 GM/177ML SOLN Suprep (no substitutions)-TAKE AS DIRECTED. 02/07/2020: For upcoming procedure  . nitrofurantoin (MACRODANTIN) 50 MG capsule Take 1 capsule (50 mg total) by mouth at bedtime.   . pravastatin (PRAVACHOL) 20 MG tablet TAKE 1 TABLET BY MOUTH EVERY DAY (Patient taking differently: Take 20 mg by  mouth daily. )   . sertraline (ZOLOFT) 100 MG tablet Take 1.5 tablets daily   . VITAMIN D PO Take 1,000 Units by mouth daily.     No facility-administered encounter medications on file as of 09/26/2020.    Current Diagnosis: Patient Active Problem List   Diagnosis Date Noted  . Orthostatic hypotension 02/08/2020  . Near syncope 02/07/2020  . Acute lower UTI 02/07/2020  . 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 Idiopathic hypotension 10/12/2019  . Syncope 10/12/2019  . Pain in joint of right shoulder 09/20/2019  . Impingement syndrome of right shoulder region 09/20/2019  . Viral upper respiratory tract infection 08/24/2019  . Pain of joint of left ankle and foot 03/18/2019  . Peroneal mononeuropathy, left 12/15/2016  . Preventative health care 10/31/2016  . Left foot drop 10/14/2016  . Weight loss 10/14/2016  . Tension-type headache, not intractable 09/17/2016  . Meningioma ([place]) 06/05/2016  . Mild cognitive impairment 04/02/2016  . Depression 04/02/2016  . Memory loss 03/21/2016  . Asymptomatic postmenopausal status 07/26/2009  . INTESTINAL GAS 09/18/2008  . ONYCHOMYCOSIS, TOENAILS 08/14/2008  . Unspecified glaucoma 07/24/2008  . Hypertensive disorder 07/24/2008  . MYALGIA 02/29/2008  . COUGH 07/26/2007  . ADENOMATOUS COLONIC POLYP 05/20/2007  . DIVERTICULOSIS, COLON 05/20/2007  . Hypothyroidism 04/07/2007  . Hypercholesterolemia 04/07/2007  . GERD 04/07/2007  .  IBS 04/07/2007    Goals Addressed   None     Have you seen any other providers since your last visit? Yes, saw the neurologist in October   Any changes in your medications or health? Yes, Sertraline was increased from 100 mg to 150 mg daily in October by the neurologist, Dr. Delice Lesch.   Any side effects from any medications? None   Do you have an symptoms or problems not managed by your medications? None   Any concerns about your health right now? None.  Patient's husband is her caretaker due to late onset Alzheimer's.   Has your provider asked that you check blood pressure, blood sugar, or follow special diet at home? No   Do you get any type of exercise on a regular basis? Patient is active but not "exercising"   Can you think of a goal you would like to reach for your health? None at this time   Do you have any problems getting your medications? Not at this time. They use [facility].   Is there anything that you would like to discuss during the appointment? Not at this time.  Reminded the patient to please have medications and supplements at the time of appointment on Monday January 24th at 2:30 pm over the telephone   Follow-Up:  [place], [place] Pharmacist Assistant [phone]

## 2020-09-26 NOTE — Chronic Care Management (AMB) (Signed)
Chronic Care Management Pharmacy  Name: Michelle [name]  MRN: [id number] DOB: 10-Jan-1946  Chief Complaint/ HPI  Michelle [name],  75 y.o. , female presents for their Initial CCM visit with the clinical pharmacist via telephone.  PCP : Ann Held, DO  Their chronic conditions include: Idiopathic Hypotension, GERD, Hypothyroidism, Hypercholesterolemia, Depression, Dementia.  Office Visits: 07/23/2020 Etter Sjogren) - urine culture was + for UTI, treated with ABX  Consult Visit: 06/29/2020 Delice Lesch, Neuro) - husband reports progression of symptoms, reported having hallucinations, sertraline was increased to one and one-half tablets daily to try and increase activity.  Medications: Outpatient Encounter Medications as of 10/01/2020  Medication Sig Note  . ALPHAGAN P 0.1 % SOLN Place 1 drop into both eyes in the morning and at bedtime.    . AMBULATORY NON FORMULARY MEDICATION Take 1 tablet by mouth daily. IBgard: Takes prn   . aspirin 81 MG tablet Take 81 mg by mouth daily.    Marland Kitchen CALCIUM PO Take 1 tablet by mouth 2 (two) times daily.    . Cranberry 1000 MG CAPS Take 1,000 mg by mouth daily.   Marland Kitchen donepezil (ARICEPT) 10 MG tablet Take 1 tablet daily   . fluticasone (FLONASE) 50 MCG/ACT nasal spray Place 2 sprays into both nostrils daily. (Patient taking differently: Place 2 sprays into both nostrils daily as needed for allergies.)   . gabapentin (NEURONTIN) 300 MG capsule Take 1 cap twice a day   . Lactobacillus-Inulin (CULTURELLE DIGESTIVE DAILY PO) Take 1 tablet by mouth daily.    Marland Kitchen levothyroxine (SYNTHROID) 50 MCG tablet Take 1 tablet (50 mcg total) by mouth daily before breakfast.   . loratadine (CLARITIN) 10 MG tablet TAKE 1 TABLET BY MOUTH EVERY DAY   . memantine (NAMENDA) 10 MG tablet Take 1 tablet (10 mg total) by mouth 2 (two) times daily.   . midodrine (PROAMATINE) 5 MG tablet TAKE 1 TABLET (5 MG TOTAL) BY MOUTH 3 (THREE) TIMES DAILY WITH MEALS.   . Multiple Vitamin  (MULTIVITAMIN) tablet Take 1 tablet by mouth daily.   . nitrofurantoin (MACRODANTIN) 50 MG capsule Take 1 capsule (50 mg total) by mouth at bedtime.   . pravastatin (PRAVACHOL) 20 MG tablet TAKE 1 TABLET BY MOUTH EVERY DAY (Patient taking differently: Take 20 mg by mouth daily.)   . sertraline (ZOLOFT) 100 MG tablet Take 1.5 tablets daily (Patient taking differently: Take 100 mg by mouth daily.)   . VITAMIN D PO Take 1,000 Units by mouth daily.    . [DISCONTINUED] amoxicillin-clavulanate (AUGMENTIN) 500-125 MG tablet 1 po bid x 7 days   . [DISCONTINUED] Na Sulfate-K Sulfate-Mg Sulf 17.5-3.13-1.6 GM/177ML SOLN Suprep (no substitutions)-TAKE AS DIRECTED. 02/07/2020: For upcoming procedure   No facility-administered encounter medications on file as of 10/01/2020.     Current Diagnosis/Assessment: Emergency planning/management officer Strain: Low Risk   . Difficulty of Paying Living Expenses: Not hard at all    Goals Addressed            This Visit's Progress   . Pharmacy Care Plan:       CARE PLAN ENTRY (see longitudinal plan of care for additional care plan information)  Current Barriers:  . Chronic Disease Management support, education, and care coordination needs related to  Hypotension, hypercholesterolemia, dementia   Hypotension BP Readings from Last 3 Encounters:  06/29/20 138/71  02/16/20 (!) 98/50  02/09/20 129/67   . Pharmacist Clinical Goal(s): o Over the next 120 days, patient will work with PharmD  and providers to maintain BP goal > 100/60. . Current regimen:  o Midodrine $RemoveBef'5mg'QRjHLMXuOS$  three times per day . Interventions: o Reviewed home blood pressure monitoring o Cautioned when going from sitting to standing . Patient self care activities - Over the next 120 days, patient will: o Check BP twice weekly, document, and provide at future appointments o Ensure daily salt intake < 2300 mg/day  Hypercholesterolemia Lab Results  Component Value Date/Time   LDLCALC 95 12/26/2019 02:42 PM    . Pharmacist Clinical Goal(s): o Over the next 120 days, patient will work with PharmD and providers to maintain LDL goal < 100  . Current regimen:  o Pravastatin $RemoveBefor'20mg'BNTCuVFCYbdX$  . Interventions: o Reviewed most recent lipid panel . Patient self care activities - Over the next 120 days, patient will: o Continue to focus on medication adherence by using a pill box   Dementia . Pharmacist Clinical Goal(s) o Over the next 120 days, patient will work with PharmD and providers to optimize medications and minimize symptoms of dementia . Current regimen:  o Memantine $RemoveBef'10mg'IvxJjyuhGW$  twice daily o Donepezil $RemoveBef'10mg'YfYpMAJLCx$  daily . Interventions: o Reviewed medication regimen and assessed for adverse effects . Patient self care activities - Over the next 120 days, patient will: o Continue to focus on medication adherence by using a pill box    Please see past updates related to this goal by clicking on the "Past Updates" button in the selected goal         Idiopathic Hypotension   BP goal is:  <140/90  Office blood pressures are  BP Readings from Last 3 Encounters:  06/29/20 138/71  02/16/20 (!) 98/50  02/09/20 129/67   Patient checks BP at home 1-2x per week Patient home BP readings are ranging: 130/71, 107/58  Patient has failed these meds in the past: None noted Patient is currently controlled on the following medications:  Marland Kitchen Midodrine $RemoveBeforeDEI'5mg'yORyHkIRsApRZdvh$  tid with meals  We discussed   Patients husband does a good job of monitoring her blood pressure  Reports adherence with medication  Denies any dizziness episodes recently  Cautioned when going from sitting to standing  Plan  Continue current medications     Hypercholesterolemia   LDL goal < 100  Last lipids Lab Results  Component Value Date   CHOL 178 12/26/2019   HDL 66.90 12/26/2019   LDLCALC 95 12/26/2019   TRIG 80.0 12/26/2019   CHOLHDL 3 12/26/2019   Hepatic Function Latest Ref Rng & Units 02/08/2020 02/07/2020 12/26/2019  Total Protein 6.5  - 8.1 g/dL 6.0(L) 5.4(L) 6.6  Albumin 3.5 - 5.0 g/dL 3.5 3.2(L) 4.2  AST 15 - 41 U/L $Remo'21 19 20  'eALtX$ ALT 0 - 44 U/L $Remo'14 13 13  'dWdbs$ Alk Phosphatase 38 - 126 U/L 42 38 52  Total Bilirubin 0.3 - 1.2 mg/dL 0.8 0.6 0.3  Bilirubin, Direct 0.0 - 0.3 mg/dL - - -     The 10-year ASCVD risk score Mikey Bussing DC Jr., et al., 2013) is: 16.2%   Values used to calculate the score:     Age: 69 years     Sex: Female     Is Non-Hispanic African American: No     Diabetic: No     Tobacco smoker: No     Systolic Blood Pressure: 409 mmHg     Is BP treated: No     HDL Cholesterol: 66.9 mg/dL     Total Cholesterol: 178 mg/dL   Patient has failed these meds in past: none  noted Patient is currently controlled on the following medications:  . Pravastatin $RemoveBefor'20mg'hivRJIQltTLv$   We discussed:  LDL goal   Reviewed most recent lipid panel Encouraged continued adherence with medication  Plan  Continue current medications    Hypothyroidism   Lab Results  Component Value Date/Time   TSH 3.332 02/08/2020 02:22 PM   TSH 4.00 12/26/2019 02:42 PM   TSH 2.38 11/10/2019 02:17 PM   FREET4 0.80 10/06/2016 04:10 PM    Patient has failed these meds in past: none noted Patient is currently controlled on the following medications:  . Levothyroxine 82mcg  We discussed:  proper medication timing   Reports adherence, most recent TSH WNL  Plan  Continue current medications  Depression   Patient has failed these meds in past: none noted Patient is currently controlled on the following medications:  . Sertraline $RemoveBefo'100mg'YcGElMOjWYz$    We discussed:  Recently switched back to $Remov'100mg'iGisuA$  from $Rem'150mg'HJvS$  daily.  This has improved patient's symptoms and reports overall mood appears to be better.  Sleep is not an issue as husband states she sleeps "like a rock" Reports overall mood is good  Plan  Continue current medications  Dementia   Patient has failed these meds in past: none noted Patient is currently controlled on the following medications:   Marland Kitchen Memantine $RemoveBeforeDEI'10mg'RGATQundVdJNcUsm$  bid . Donepezil $RemoveBef'10mg'vJjkLFADuX$   We discussed:  No adverse effects reported from medications.  She is adherent with these medications Reports no resistance with taking medications Husband organizes her daily pills into a weekly pill box  Plan  Continue current medications   Vaccines   Reviewed and discussed patient's vaccination history.    Immunization History  Administered Date(s) Administered  . Fluad Quad(high Dose 65+) 06/07/2019, 05/31/2020  . Influenza Split 09/23/2011  . Influenza Whole 07/26/2007, 06/30/2008, 07/26/2009, 09/12/2010  . Influenza, High Dose Seasonal PF 06/15/2013, 10/05/2015, 07/20/2017, 05/21/2018  . Influenza,inj,Quad PF,6+ Mos 06/19/2014, 10/02/2016  . PFIZER(Purple Top)SARS-COV-2 Vaccination 10/30/2019, 11/23/2019  . Pneumococcal Conjugate-13 11/03/2014  . Pneumococcal Polysaccharide-23 09/23/2011  . Td 07/02/2005  . Zoster 11/14/2008    Plan  All vaccinations are up to date. Medication Management   Patient's preferred pharmacy is:  [id number] - [place], [place] - [address] [place] [address] Phone: [phone] Fax: [phone]  [place], [place] - [address] [place] [address] Phone: [phone] Fax: [phone]  Uses pill box? Yes Pt endorses 100% compliance  We discussed: Current pharmacy is preferred with insurance plan and patient is satisfied with pharmacy services  Plan  Continue current medication management strategy   Follow up: 4 month phone visit  Beverly Milch, PharmD Clinical Pharmacist [place] [phone]

## 2020-10-01 ENCOUNTER — Ambulatory Visit: Payer: Medicare Other

## 2020-10-01 DIAGNOSIS — E7800 Pure hypercholesterolemia, unspecified: Secondary | ICD-10-CM

## 2020-10-01 DIAGNOSIS — F0390 Unspecified dementia without behavioral disturbance: Secondary | ICD-10-CM

## 2020-10-01 DIAGNOSIS — I950 Idiopathic hypotension: Secondary | ICD-10-CM

## 2020-10-01 NOTE — Patient Instructions (Addendum)
Visit Information  Goals Addressed            This Visit's Progress   . Pharmacy Care Plan:       CARE PLAN ENTRY (see longitudinal plan of care for additional care plan information)  Current Barriers:  . Chronic Disease Management support, education, and care coordination needs related to  Hypotension, hypercholesterolemia, dementia   Hypotension BP Readings from Last 3 Encounters:  06/29/20 138/71  02/16/20 (!) 98/50  02/09/20 129/67   . Pharmacist Clinical Goal(s): o Over the next 120 days, patient will work with PharmD and providers to maintain BP goal > 100/60. . Current regimen:  o Midodrine 5mg  three times per day . Interventions: o Reviewed home blood pressure monitoring o Cautioned when going from sitting to standing . Patient self care activities - Over the next 120 days, patient will: o Check BP twice weekly, document, and provide at future appointments o Ensure daily salt intake < 2300 mg/day  Hypercholesterolemia Lab Results  Component Value Date/Time   LDLCALC 95 12/26/2019 02:42 PM   . Pharmacist Clinical Goal(s): o Over the next 120 days, patient will work with PharmD and providers to maintain LDL goal < 100  . Current regimen:  o Pravastatin 20mg  . Interventions: o Reviewed most recent lipid panel . Patient self care activities - Over the next 120 days, patient will: o Continue to focus on medication adherence by using a pill box   Dementia . Pharmacist Clinical Goal(s) o Over the next 120 days, patient will work with PharmD and providers to optimize medications and minimize symptoms of dementia . Current regimen:  o Memantine 10mg  twice daily o Donepezil 10mg  daily . Interventions: o Reviewed medication regimen and assessed for adverse effects . Patient self care activities - Over the next 120 days, patient will: o Continue to focus on medication adherence by using a pill box    Please see past updates related to this goal by clicking on  the "Past Updates" button in the selected goal         Michelle [name] was given information about Chronic Care Management services today including:  1. CCM service includes personalized support from designated clinical staff supervised by her physician, including individualized plan of care and coordination with other care providers 2. 24/7 contact phone numbers for assistance for urgent and routine care needs. 3. Standard insurance, coinsurance, copays and deductibles apply for chronic care management only during months in which we provide at least 20 minutes of these services. Most insurances cover these services at 100%, however patients may be responsible for any copay, coinsurance and/or deductible if applicable. This service may help you avoid the need for more expensive face-to-face services. 4. Only one practitioner may furnish and bill the service in a calendar month. 5. The patient may stop CCM services at any time (effective at the end of the month) by phone call to the office staff.  Patient agreed to services and verbal consent obtained.   The patient verbalized understanding of instructions, educational materials, and care plan provided today and agreed to receive a mailed copy of patient instructions, educational materials, and care plan.  Telephone follow up appointment with pharmacy team member scheduled for: 4 months  [name], [facility]  Hypotension As your heart beats, it forces blood through your body. Hypotension, commonly called low blood pressure, is when the force of blood pumping through your arteries is too weak. Arteries are blood vessels that carry blood from  the heart throughout the body. Depending on the cause and severity, hypotension may be harmless (benign) or may cause serious problems (be critical). When blood pressure is too low, you may not get enough blood to your brain or to the rest of your organs. This can cause weakness, light-headedness, rapid  heartbeat, and fainting. What are the causes? This condition may be caused by:  Blood loss.  Loss of body fluids (dehydration).  Heart problems.  Hormone (endocrine) problems.  Pregnancy.  Severe infection.  Lack of certain nutrients.  Severe allergic reactions (anaphylaxis).  Certain medicines, such as blood pressure medicine or medicines that make the body lose excess fluids (diuretics). Sometimes, hypotension may be caused by not taking medicine as directed, such as taking too much of a certain medicine. What increases the risk? The following factors may make you more likely to develop this condition:  Age. Risk increases as you get older.  Conditions that affect the heart or the central nervous system.  Taking certain medicines, such as blood pressure medicine or diuretics.  Being pregnant. What are the signs or symptoms? Common symptoms of this condition include:  Weakness.  Light-headedness.  Dizziness.  Blurred vision.  Fatigue.  Rapid heartbeat.  Fainting, in severe cases. How is this diagnosed? This condition is diagnosed based on:  Your medical history.  Your symptoms.  Your blood pressure measurement. Your health care provider will check your blood pressure when you are: ? Lying down. ? Sitting. ? Standing. A blood pressure reading is recorded as two numbers, such as "120 over 80" (or 120/80). The first ("top") number is called the systolic pressure. It is a measure of the pressure in your arteries as your heart beats. The second ("bottom") number is called the diastolic pressure. It is a measure of the pressure in your arteries when your heart relaxes between beats. Blood pressure is measured in a unit called mm Hg. Healthy blood pressure for most adults is 120/80. If your blood pressure is below 90/60, you may be diagnosed with hypotension. Other information or tests that may be used to diagnose hypotension include:  Your other vital signs,  such as your heart rate and temperature.  Blood tests.  Tilt table test. For this test, you will be safely secured to a table that moves you from a lying position to an upright position. Your heart rhythm and blood pressure will be monitored during the test. How is this treated? Treatment for this condition may include:  Changing your diet. This may involve eating more salt (sodium) or drinking more water.  Taking medicines to raise your blood pressure.  Changing the dosage of certain medicines you are taking that might be lowering your blood pressure.  Wearing compression stockings. These stockings help to prevent blood clots and reduce swelling in your legs. In some cases, you may need to go to the hospital for:  Fluid replacement. This means you will receive fluids through an IV.  Blood replacement. This means you will receive donated blood through an IV (transfusion).  Treating an infection or heart problems, if this applies.  Monitoring. You may need to be monitored while medicines that you are taking wear off. Follow these instructions at home: Eating and drinking  Drink enough fluid to keep your urine pale yellow.  Eat a healthy diet, and follow instructions from your health care provider about eating or drinking restrictions. A healthy diet includes: ? Fresh fruits and vegetables. ? Whole grains. ? Lean meats. ? Low-fat  dairy products.  Eat extra salt only as directed. Do not add extra salt to your diet unless your health care provider told you to do that.  Eat frequent, small meals.  Avoid standing up suddenly after eating.   Medicines  Take over-the-counter and prescription medicines only as told by your health care provider. ? Follow instructions from your health care provider about changing the dosage of your current medicines, if this applies. ? Do not stop or adjust any of your medicines on your own. General instructions  Wear compression stockings as told  by your health care provider.  Get up slowly from lying down or sitting positions. This gives your blood pressure a chance to adjust.  Avoid hot showers and excessive heat as directed by your health care provider.  Return to your normal activities as told by your health care provider. Ask your health care provider what activities are safe for you.  Do not use any products that contain nicotine or tobacco, such as cigarettes, e-cigarettes, and chewing tobacco. If you need help quitting, ask your health care provider.  Keep all follow-up visits as told by your health care provider. This is important.   Contact a health care provider if you:  Vomit.  Have diarrhea.  Have a fever for more than 2-3 days.  Feel more thirsty than usual.  Feel weak and tired. Get help right away if you:  Have chest pain.  Have a fast or irregular heartbeat.  Develop numbness in any part of your body.  Cannot move your arms or your legs.  Have trouble speaking.  Become sweaty or feel light-headed.  Faint.  Feel short of breath.  Have trouble staying awake.  Feel confused. Summary  Hypotension is when the force of blood pumping through your arteries is too weak.  Hypotension may be harmless (benign) or may cause serious problems (be critical).  Treatment for this condition may include changing your diet, changing your medicines, and wearing compression stockings.  In some cases, you may need to go to the hospital for fluid or blood replacement. This information is not intended to replace advice given to you by your health care provider. Make sure you discuss any questions you have with your health care provider. Document Revised: 02/18/2018 Document Reviewed: 02/18/2018 Elsevier Patient Education  [place].

## 2020-10-20 ENCOUNTER — Other Ambulatory Visit: Payer: Self-pay | Admitting: Family Medicine

## 2020-10-20 DIAGNOSIS — E039 Hypothyroidism, unspecified: Secondary | ICD-10-CM

## 2020-11-16 DIAGNOSIS — M7542 Impingement syndrome of left shoulder: Secondary | ICD-10-CM | POA: Diagnosis not present

## 2020-11-16 DIAGNOSIS — M7541 Impingement syndrome of right shoulder: Secondary | ICD-10-CM | POA: Diagnosis not present

## 2020-11-18 ENCOUNTER — Other Ambulatory Visit: Payer: Self-pay | Admitting: Family Medicine

## 2020-11-18 DIAGNOSIS — E039 Hypothyroidism, unspecified: Secondary | ICD-10-CM

## 2020-11-22 DIAGNOSIS — D692 Other nonthrombocytopenic purpura: Secondary | ICD-10-CM | POA: Diagnosis not present

## 2020-11-22 DIAGNOSIS — H10413 Chronic giant papillary conjunctivitis, bilateral: Secondary | ICD-10-CM | POA: Diagnosis not present

## 2020-11-22 DIAGNOSIS — Z961 Presence of intraocular lens: Secondary | ICD-10-CM | POA: Diagnosis not present

## 2020-11-22 DIAGNOSIS — L820 Inflamed seborrheic keratosis: Secondary | ICD-10-CM | POA: Diagnosis not present

## 2020-11-22 DIAGNOSIS — Z85828 Personal history of other malignant neoplasm of skin: Secondary | ICD-10-CM | POA: Diagnosis not present

## 2020-11-22 DIAGNOSIS — H401132 Primary open-angle glaucoma, bilateral, moderate stage: Secondary | ICD-10-CM | POA: Diagnosis not present

## 2020-11-28 ENCOUNTER — Telehealth: Payer: Self-pay | Admitting: Family Medicine

## 2020-11-28 NOTE — Telephone Encounter (Signed)
Caller : Michelle [name]  Call Back @ [phone]  Coralyn Mark states that [facility] has faxed multiple orders to be signed to Dr Etter Sjogren, no response. Coralyn Mark, will refax orders for signature

## 2020-11-29 NOTE — Telephone Encounter (Signed)
Noted. Orders are with [facility]. Waiting for them to be returned.

## 2020-12-11 ENCOUNTER — Other Ambulatory Visit: Payer: Self-pay | Admitting: Family Medicine

## 2020-12-11 ENCOUNTER — Telehealth: Payer: Self-pay

## 2020-12-11 DIAGNOSIS — E039 Hypothyroidism, unspecified: Secondary | ICD-10-CM

## 2020-12-11 NOTE — Telephone Encounter (Signed)
Michelle [name] from [facility] called. She states the pt's husband states has been having some fatigue and her urine is an off color with a smell. I gave a verbal for a urine dip and a culture.

## 2020-12-12 DIAGNOSIS — N390 Urinary tract infection, site not specified: Secondary | ICD-10-CM | POA: Diagnosis not present

## 2020-12-12 NOTE — Telephone Encounter (Signed)
Perfect thank you!

## 2020-12-20 NOTE — Telephone Encounter (Signed)
Caller : [place]  Call Back @ [phone]  Vicente Males or Patient's spouse is requesting a call back in reference to plan for patient moving forward.  Please advise

## 2020-12-20 NOTE — Telephone Encounter (Signed)
Her results were negative If having symptoms we can refer to urology

## 2020-12-20 NOTE — Telephone Encounter (Signed)
You should have her results in your folder. Please advise.

## 2020-12-24 NOTE — Telephone Encounter (Signed)
Message left for Michelle [name] at her office.

## 2021-01-03 ENCOUNTER — Telehealth: Payer: Self-pay | Admitting: Family Medicine

## 2021-01-03 NOTE — Telephone Encounter (Signed)
FYI

## 2021-01-03 NOTE — Telephone Encounter (Signed)
Noted  

## 2021-01-03 NOTE — Telephone Encounter (Signed)
[facility] [phone]  Pt is being discharge, pt is stable

## 2021-01-07 ENCOUNTER — Other Ambulatory Visit: Payer: Self-pay | Admitting: Family Medicine

## 2021-01-07 DIAGNOSIS — E039 Hypothyroidism, unspecified: Secondary | ICD-10-CM

## 2021-01-30 ENCOUNTER — Ambulatory Visit (INDEPENDENT_AMBULATORY_CARE_PROVIDER_SITE_OTHER): Payer: Medicare Other | Admitting: Pharmacist

## 2021-01-30 DIAGNOSIS — E7800 Pure hypercholesterolemia, unspecified: Secondary | ICD-10-CM

## 2021-01-30 DIAGNOSIS — I950 Idiopathic hypotension: Secondary | ICD-10-CM

## 2021-01-30 DIAGNOSIS — F0390 Unspecified dementia without behavioral disturbance: Secondary | ICD-10-CM

## 2021-01-30 NOTE — Chronic Care Management (AMB) (Signed)
Chronic Care Management Pharmacy Note  02/01/2021 Name:  POETRY CERRO MRN:  [id number] DOB:  October 23, 1945  Subjective: Michelle [name] is an 75 y.o. year old female who is a primary patient of Ann Held, DO.  The CCM team was consulted for assistance with disease management and care coordination needs.    Engaged with patient by telephone for follow up visit in response to provider referral for pharmacy case management and/or care coordination services.   Consent to Services:  The patient was given information about Chronic Care Management services, agreed to services, and gave verbal consent prior to initiation of services.  Please see initial visit note for detailed documentation.   Patient Care Team: Carollee Herter, Alferd Apa, DO as PCP - General Calvert Cantor, MD as Consulting Physician (Ophthalmology) Irene Shipper, MD as Consulting Physician (Gastroenterology) Cameron Sprang, MD as Consulting Physician (Neurology) Victor, A [facility] Cherre Robins, PharmD (Pharmacist)  Recent office visits: 07/23/2020 - PCP - acute visit for cystitis - ABX prescribed  Recent consult visits: 11/16/2020 - Phoebe Sharps - ortho; left shoulder injection  Hospital visits: None in previous 6 months  Objective:  Lab Results  Component Value Date   CREATININE 1.06 02/16/2020   CREATININE 0.89 02/09/2020   CREATININE 0.79 02/08/2020    No results found for: HGBA1C Last diabetic Eye exam: No results found for: HMDIABEYEEXA  Last diabetic Foot exam: No results found for: HMDIABFOOTEX      Component Value Date/Time   CHOL 178 12/26/2019 1442   TRIG 80.0 12/26/2019 1442   HDL 66.90 12/26/2019 1442   CHOLHDL 3 12/26/2019 1442   VLDL 16.0 12/26/2019 1442   LDLCALC 95 12/26/2019 1442    Hepatic Function Latest Ref Rng & Units 02/08/2020 02/07/2020 12/26/2019  Total Protein 6.5 - 8.1 g/dL 6.0(L) 5.4(L) 6.6  Albumin 3.5 - 5.0 g/dL 3.5 3.2(L) 4.2  AST 15 - 41  U/L $Re'21 19 20  'rpr$ ALT 0 - 44 U/L $Remo'14 13 13  'PhZyq$ Alk Phosphatase 38 - 126 U/L 42 38 52  Total Bilirubin 0.3 - 1.2 mg/dL 0.8 0.6 0.3  Bilirubin, Direct 0.0 - 0.3 mg/dL - - -    Lab Results  Component Value Date/Time   TSH 3.332 02/08/2020 02:22 PM   TSH 4.00 12/26/2019 02:42 PM   TSH 2.38 11/10/2019 02:17 PM   FREET4 0.80 10/06/2016 04:10 PM    CBC Latest Ref Rng & Units 02/09/2020 02/08/2020 02/07/2020  WBC 4.0 - 10.5 K/uL 5.8 4.2 4.1  Hemoglobin 12.0 - 15.0 g/dL 10.3(L) 10.8(L) 10.2(L)  Hematocrit 36.0 - 46.0 % 31.6(L) 33.4(L) 31.3(L)  Platelets 150 - 400 K/uL 184 198 192    Lab Results  Component Value Date/Time   VD25OH 45.23 06/07/2019 11:41 AM   VD25OH 28.76 (L) 05/12/2018 02:21 PM    Clinical ASCVD: No  The 10-year ASCVD risk score Mikey Bussing DC Jr., et al., 2013) is: 16.2%   Values used to calculate the score:     Age: 65 years     Sex: Female     Is Non-Hispanic African American: No     Diabetic: No     Tobacco smoker: No     Systolic Blood Pressure: 510 mmHg     Is BP treated: No     HDL Cholesterol: 66.9 mg/dL     Total Cholesterol: 178 mg/dL    Other: DEXA 02/03/2018  T-Score for spine = +2.7  T-Score for hip (  left) = -1.4  Frax = 9.3% / 1.5%  Social History   Tobacco Use  Smoking Status Never Smoker  Smokeless Tobacco Never Used   BP Readings from Last 3 Encounters:  06/29/20 138/71  02/16/20 (!) 98/50  02/09/20 129/67   Pulse Readings from Last 3 Encounters:  06/29/20 75  02/16/20 68  02/08/20 63   Wt Readings from Last 3 Encounters:  06/29/20 131 lb 6.4 oz (59.6 kg)  02/16/20 126 lb 12.8 oz (57.5 kg)  02/08/20 117 lb 11.6 oz (53.4 kg)    Assessment: Review of patient past medical history, allergies, medications, health status, including review of consultants reports, laboratory and other test data, was performed as part of comprehensive evaluation and provision of chronic care management services.   SDOH:  (Social Determinants of Health) assessments and  interventions performed:  SDOH Interventions   Flowsheet Row Most Recent Value  SDOH Interventions   Food Insecurity Interventions Intervention Not Indicated  Financial Strain Interventions Intervention Not Indicated      CCM Care Plan  No Known Allergies  Medications Reviewed Today    Reviewed by Cherre Robins, PharmD (Pharmacist) on 01/30/21 at 41  Med List Status: <None>  Medication Order Taking? Sig Documenting Provider Last Dose Status Informant  ALPHAGAN P 0.1 % SOLN [id number] Yes Place 1 drop into both eyes in the morning and at bedtime.  [provider] Taking Active Spouse/Significant Other           Med Note Rosana Hoes, SOPHIA A   Wed Aug 27, 2016  2:33 PM)    AMBULATORY NON FORMULARY MEDICATION [id number] Yes Take 1 tablet by mouth daily. IBgard: Takes prn [provider] Taking Active Spouse/Significant Other  aspirin 81 MG tablet [id number] Yes Take 81 mg by mouth daily.  [provider] Taking Active Spouse/Significant Other  CALCIUM PO [id number] Yes Take 1 tablet by mouth 2 (two) times daily.  [provider] Taking Active Spouse/Significant Other  Cranberry 1000 MG CAPS [id number] Yes Take 1,000 mg by mouth daily. [provider] Taking Active Spouse/Significant Other  donepezil (ARICEPT) 10 MG tablet [id number] Yes Take 1 tablet daily Cameron Sprang, MD Taking Active   fluticasone [facility]) 50 MCG/ACT nasal spray [id number] Yes Place 2 sprays into both nostrils daily.  Patient taking differently: Place 2 sprays into both nostrils daily as needed for allergies.   Collene Gobble, MD Taking Active   gabapentin (NEURONTIN) 300 MG capsule [id number] Yes Take 1 cap twice a day Cameron Sprang, MD Taking Active   Lactobacillus-Inulin (CULTURELLE DIGESTIVE DAILY PO) [id number] Yes Take 1 tablet by mouth daily.  [provider] Taking Active Spouse/Significant Other  levothyroxine (SYNTHROID) 50 MCG tablet [id number] Yes TAKE 1 TABLET  BY MOUTH DAILY BEFORE BREAKFAST Carollee Herter, Alferd Apa, DO Taking Active   loratadine (CLARITIN) 10 MG tablet [id number] Yes TAKE 1 TABLET BY MOUTH EVERY DAY Carollee Herter, Alferd Apa, DO Taking Active   memantine (NAMENDA) 10 MG tablet [id number] Yes Take 1 tablet (10 mg total) by mouth 2 (two) times daily. Cameron Sprang, MD Taking Active   midodrine (PROAMATINE) 5 MG tablet [id number] Yes TAKE 1 TABLET (5 MG TOTAL) BY MOUTH 3 (THREE) TIMES DAILY WITH MEALS. Ann Held, DO Taking Active   Multiple Vitamin (MULTIVITAMIN) tablet [id number] Yes Take 1 tablet by mouth daily. [provider] Taking Active Spouse/Significant Other  nitrofurantoin (MACRODANTIN) 50 MG capsule [id number] Yes Take 1 capsule (50 mg total) by mouth  at bedtime. Carollee Herter, Kendrick Fries R, DO Taking Active   pravastatin (PRAVACHOL) 20 MG tablet [id number] No TAKE 1 TABLET BY MOUTH EVERY DAY  Patient not taking: Reported on 01/30/2021   Ann Held, DO Not Taking Active   sertraline (ZOLOFT) 100 MG tablet [id number] Yes Take 1.5 tablets daily  Patient taking differently: Take 100 mg by mouth daily.   Cameron Sprang, MD Taking Active   VITAMIN D PO [id number]  Take 1,000 Units by mouth daily.  [provider]  Active Spouse/Significant Other          Patient Active Problem List   Diagnosis Date Noted  . Orthostatic hypotension 02/08/2020  . Near syncope 02/07/2020  . Acute lower UTI 02/07/2020  . Early onset Alzheimer's dementia without behavioral disturbance ([place]) 11/10/2019  . Idiopathic hypotension 10/12/2019  . Syncope 10/12/2019  . Pain in joint of right shoulder 09/20/2019  . Impingement syndrome of right shoulder region 09/20/2019  . Viral upper respiratory tract infection 08/24/2019  . Pain of joint of left ankle and foot 03/18/2019  . Peroneal mononeuropathy, left 12/15/2016  . Preventative health care 10/31/2016  . Left foot drop 10/14/2016  . Weight loss 10/14/2016  . Tension-type  headache, not intractable 09/17/2016  . Meningioma ([place]) 06/05/2016  . Mild cognitive impairment 04/02/2016  . Depression 04/02/2016  . Memory loss 03/21/2016  . Asymptomatic postmenopausal status 07/26/2009  . INTESTINAL GAS 09/18/2008  . ONYCHOMYCOSIS, TOENAILS 08/14/2008  . Unspecified glaucoma 07/24/2008  . Hypertensive disorder 07/24/2008  . MYALGIA 02/29/2008  . COUGH 07/26/2007  . ADENOMATOUS COLONIC POLYP 05/20/2007  . DIVERTICULOSIS, COLON 05/20/2007  . Hypothyroidism 04/07/2007  . Hypercholesterolemia 04/07/2007  . GERD 04/07/2007  . IBS 04/07/2007    Immunization History  Administered Date(s) Administered  . Fluad Quad(high Dose 65+) 06/07/2019, 05/31/2020  . Influenza Split 09/23/2011  . Influenza Whole 07/26/2007, 06/30/2008, 07/26/2009, 09/12/2010  . Influenza, High Dose Seasonal PF 06/15/2013, 10/05/2015, 07/20/2017, 05/21/2018  . Influenza,inj,Quad PF,6+ Mos 06/19/2014, 10/02/2016  . PFIZER(Purple Top)SARS-COV-2 Vaccination 10/30/2019, 11/23/2019  . Pneumococcal Conjugate-13 11/03/2014  . Pneumococcal Polysaccharide-23 09/23/2011  . Td 07/02/2005  . Zoster, Live 11/14/2008    Conditions to be addressed/monitored: HTN, HLD, Depression, Dementia and osteoporosis; hypothyroidism; meningimoa; frequent UTIs  Care Plan : General Pharmacy (Adult)  Updates made by Cherre Robins, PHARMD since 02/01/2021 12:00 AM    Problem: Medication management and chronic care management for idiopathic hypotension; dementia; hyperlipidemia; meningioma; osteopenia; hypothyroidism; GERD; depression; frequent UTIs   Priority: High  Onset Date: 01/31/2021  Note:   Current Barriers:  . Does not adhere to prescribed medication regimen . Chronic Disease Management support, education, and care coordination needs related to Hypotension, hypercholesterolemia, dementia, osteopenia  Pharmacist Clinical Goal(s):  Marland Kitchen Over the next 180 days, patient will adhere to prescribed medication regimen  as evidenced by filling medications on time . contact provider office for questions/concerns as evidenced notation of same in electronic health record through collaboration with PharmD and provider.   Interventions: . 1:1 collaboration with Carollee Herter, Alferd Apa, DO regarding development and update of comprehensive plan of care as evidenced by provider attestation and co-signature . Inter-disciplinary care team collaboration (see longitudinal plan of care) . Comprehensive medication review performed; medication list updated in electronic medical record  Hypotension/ Low Blood pressure BP Readings from Last 3 Encounters:  06/29/20 138/71  02/16/20 (!) 98/50  02/09/20 129/67   . BP improved;  BP goal > 100/60. Marland Kitchen Husband reports patient has  not experienced dizziness since midodrine started.  . Current regimen:  o Midodrine $RemoveBef'5mg'XTKmQVzrty$  three times per day . Interventions: o Reminded to go slow when changing from sitting to standing o Check blood pressure twice weekly, document, and provide at future appointments o Continue current therapy  Hypercholesterolemia Lab Results  Component Value Date/Time   LDLCALC 95 12/26/2019 02:42 PM .  Last LDL was at goal but this was likely when she was taking pravastatin regularly; LDL goal < 100  . Current regimen:  o Not currently taking cholesterol medication but pravastatin $RemoveBeforeD'20mg'zHpFiQRKoBeAXV$  daily still on medication list . Interventions: o Reviewed most recent lipid panel; consider rechecking since not currently taking statin  o Coordinated with Dr Etter Sjogren to verify if she would like patient to restart pravastatin. Recommended continue OFF pravastatin; will recheck lipids at next OV. o Continue to focus on medication adherence by using a pill box  Dementia . Goal: optimize medications and minimize symptoms of dementia . Current regimen:  o Memantine $RemoveBef'10mg'wuYkEuwSJd$  twice daily o Donepezil $RemoveBef'10mg'xMNamVMeps$  daily . Interventions: o Reviewed medication regimen and assessed for adverse  effects o Continue to focus on medication adherence by using a pill box - husband assists with medications  Osteopenia:  . Current treatment: o Calcium  o Vitamin D  o MVI daily . Last DEXA: 01/25/2018 T-score +2.7 at spine; -1.4 at left femoral neck Frax estimate: 9.3% of major osteoporosis fracture and 1.5% for hip fracture . Pharmacological treatment not indicated based on last DEXA and FRAX . Interventions:  o Recommended continue to take calcium, vitamin D and MVI o Consider rechecking DEXA.  o Fall prevention  Patient Goals/Self-Care Activities . Over the next 180 days, patient will:  take medications as prescribed and check blood pressure twice per week, document, and provide at future appointments  Follow Up Plan: Telephone follow up appointment with care management team member scheduled for:  6 months       Medication Assistance: None required.  Patient affirms current coverage meets needs.  Patient's preferred pharmacy is:  [id number] - [place], [place] - [address] [place] [address] Phone: [phone] Fax: [phone]  [facility] [address] [place] [address] Phone: [phone] Fax: [phone]  Uses pill box? Yes Caregiver endorses 100% compliance - however reports she has not taken pravastatin in > 1year but is still on med list. Not filled in last 6 months.  Follow Up:  Patient agrees to Care Plan and Follow-up.  Plan: Telephone follow up appointment with care management team member scheduled for:  6 months  Cherre Robins, PharmD Clinical Pharmacist [facility] [place] [phone]

## 2021-01-31 ENCOUNTER — Other Ambulatory Visit: Payer: Self-pay | Admitting: Family Medicine

## 2021-01-31 NOTE — Patient Instructions (Addendum)
Mr. Frechette -  It was a pleasure speaking with you today. Please feel free to contact me if you have any questions or concerns. Below is information regarding your wife's health goals.   This is the number for [place] [phone] - you can call to see about COVID or Shingrix vaccines.    Cherre Robins, PharmD Clinical Pharmacist [facility] [phone]  Visit Information  PATIENT GOALS: Goals Addressed            This Visit's Progress   . Pharmacy Care Plan:       CARE PLAN ENTRY (see longitudinal plan of care for additional care plan information)  Current Barriers:  . Chronic Disease Management support, education, and care coordination needs related to  Hypotension, hypercholesterolemia, dementia   Hypotension/ Low Blood pressure BP Readings from Last 3 Encounters:  06/29/20 138/71  02/16/20 (!) 98/50  02/09/20 129/67   . Pharmacist Clinical Goal(s): o Over the next 120 days, patient will work with PharmD and providers to maintain BP goal > 100/60. . Current regimen:  o Midodrine 5mg  three times per day . Interventions: o Cautioned to go slow when changing from sitting to standing . Patient self care activities - Over the next 120 days, patient will: o Check blood pressure twice weekly, document, and provide at future appointments o Continue current therapy  Hypercholesterolemia Lab Results  Component Value Date/Time   LDLCALC 95 12/26/2019 02:42 PM   . Pharmacist Clinical Goal(s): o Over the next 120 days, patient will work with PharmD and providers to maintain LDL goal < 100  . Current regimen:  o Not currently taking cholesterol medication but pravastatin 20mg  daily still on medication list . Interventions: o Reviewed most recent lipid panel o Coordinate with Dr Etter Sjogren to verify if she would like patient to restart pravastatin . Patient self care activities - Over the next 120 days, patient  will: o Continue to focus on medication adherence by using a pill box o Ok to remain off pravastatin per Dr Etter Sjogren   Dementia . Pharmacist Clinical Goal(s) o Over the next 120 days, patient will work with PharmD and providers to optimize medications and minimize symptoms of dementia . Current regimen:  o Memantine 10mg  twice daily o Donepezil 10mg  daily . Interventions: o Reviewed medication regimen and assessed for adverse effects . Patient self care activities - Over the next 120 days, patient will: o Continue to focus on medication adherence by using a pill box    Please see past updates related to this goal by clicking on the "Past Updates" button in the selected goal         Patient verbalizes understanding of instructions provided today and agrees to view in [place].   Telephone follow up appointment with care management team member scheduled for: 6 months

## 2021-02-01 ENCOUNTER — Other Ambulatory Visit: Payer: Self-pay | Admitting: Family Medicine

## 2021-02-01 DIAGNOSIS — E039 Hypothyroidism, unspecified: Secondary | ICD-10-CM

## 2021-02-25 ENCOUNTER — Other Ambulatory Visit: Payer: Self-pay

## 2021-02-25 ENCOUNTER — Encounter: Payer: Self-pay | Admitting: Neurology

## 2021-02-25 ENCOUNTER — Ambulatory Visit: Payer: Medicare Other | Admitting: Neurology

## 2021-02-25 VITALS — BP 118/75 | HR 88 | Resp 18 | Ht 63.0 in | Wt 134.0 lb

## 2021-02-25 DIAGNOSIS — G44209 Tension-type headache, unspecified, not intractable: Secondary | ICD-10-CM

## 2021-02-25 DIAGNOSIS — G301 Alzheimer's disease with late onset: Secondary | ICD-10-CM | POA: Diagnosis not present

## 2021-02-25 DIAGNOSIS — F0280 Dementia in other diseases classified elsewhere without behavioral disturbance: Secondary | ICD-10-CM | POA: Diagnosis not present

## 2021-02-25 MED ORDER — GABAPENTIN 300 MG PO CAPS
ORAL_CAPSULE | ORAL | 3 refills | Status: AC
Start: 2021-02-25 — End: ?

## 2021-02-25 MED ORDER — SERTRALINE HCL 100 MG PO TABS: 100.0000 mg | ORAL_TABLET | Freq: Every day | ORAL | 3 refills | Status: DC

## 2021-02-25 MED ORDER — MEMANTINE HCL 10 MG PO TABS: 10.0000 mg | ORAL_TABLET | Freq: Two times a day (BID) | ORAL | 3 refills | Status: AC

## 2021-02-25 MED ORDER — DONEPEZIL HCL 10 MG PO TABS: ORAL_TABLET | ORAL | 3 refills | Status: DC

## 2021-02-25 NOTE — Progress Notes (Signed)
NEUROLOGY FOLLOW UP OFFICE NOTE  Michelle [name] [id number] [birth year]  HISTORY OF PRESENT ILLNESS: I had the pleasure of seeing Michelle [name] in follow-up in the neurology clinic on 02/25/2021.  The patient was last seen 8 months ago for Alzheimer's dementia. She is again accompanied by her husband who helps supplement the history today.  Records and images were personally reviewed where available.  On her last visit, Sertraline was increased to 150mg  daily to hopefully help with increasing motivation to do more activities if symptoms are due to depression. Her husband called to report that he was having an even harder time getting her out of bed on the higher dose and reduced back to 100mg  daily. She continues on Donepezil 10mg  daily, Memantine 10mg  BID. She is on Gabapentin 300mg  BID for headache prophylaxis.   Since her last visit, her husband reports continued cognitive decline on a daily basis, with good and bad days. He manages medications, finances, meals. He assists with bathing. She is able to dress herself and choose clothes appropriately. She is harder of hearing now, hearing aids today are not helping (husband reports batteries may need changing). There is a delay in her responses, also partly due to hearing loss. She has urinary incontinence, this morning she had wet the bed. She wears Depends but sometime in the night she got up and changed but did not put the diapers back on. Her husband denies any personality changes, paranoia, or hallucinations. She sleeps through the night. No wandering behaviors. She used to have a [facility] nurse coming once a week then the service was discontinued. Their cleaning lady comes a couple of days a week and stays with her when her husband does errands. She has not complained of headaches in a while. She denies any dizziness, focal numbness/tingling/weakness. No falls since June 2021 when she was hypotensive. She has had good response to  Midodrine.   History on Initial Assessment 04/02/2016: This is a pleasant 75 yo RH woman with a history of hypertension, hyperlipidemia, hypothyroidism, who presented for evaluation of memory loss. Her daughter started noticing memory changes a year ago. The patient states she can tell a difference in her memory, but that her family notices it more and "they may think I'm in denial." Her family has noticed worsening over the past 6 months, she repeats herself several times, sometimes having the same conversation four times within an hour. Her husband reports that she asked him repeatedly last 75th of July weekend what he was wearing to go home. She misplaces things frequently in the house. She left the stove on one time boiling water. She takes longer to get ready to go out. She denies getting lost driving, no missed bills or medications. 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 Her family also expressed concern about personality changes, she used to be laidback but now her patience is not there. She is cussing a lot more easily now. She doesn't want to do things she used to like. She does not like going out, and stayed in the room while at the beach more than with her family. No paranoia. No difficulties with ADLs. There is no family history of dementia, she denies any history of head injuries, alcohol intake. She has been having dull right frontal headaches for the past 6 months attributed to her sinuses, no associated nausea/vomiting. She denies any dizziness, diplopia, dysarthria/dysphagia,  focal numbness/tingling/weakness, bowel/bladder dysfunction. She has occasional neck and left shoulder pain. No falls.    She had an MMSE done with her PCP this month, MMSE 25/30. TSH and B12 unremarkable. She has been unable to do the MRI brain.  Diagnostic Data: Her MRI without contrast in 04/2016 did not show any acute intracranial changes,  there was note of a meningioma in the dorsal clivus with mild mass effect on the post. Post-contrast MRI was ordered, which again showed the 68mm enhancing dural-based mass posterior to the clivus on the right. There is mass effect on the anterior pons on the right which is mildly compressed. The mass is touching the basilar causing mild displacement to the left. There is no definite invasion of the sella. It may be touching the cisternal segment of the trigeminal nerve.   Repeat MRI brain with and without contrast done 04/2019 for worsening headaches and confusion did not show any acute changes, there was minimal change from last MRI in 2018 with mild to moderate diffuse atrophy, mild chronic microvascular disease. The meningioma along the dorsal aspect of the clivus to the right of midline was not significantly changed, 1.9 by 1.0 x 2.5 cm, with mass effect on pons and midbrain unchanged.    PAST MEDICAL HISTORY: Past Medical History:  Diagnosis Date   Adenomatous colon polyp    Allergy    Dementia ([place])    GERD (gastroesophageal reflux disease)    Glaucoma    Hx of hearing loss    bilateral hearing aids   Hyperlipidemia    Hypertension    IBS (irritable bowel syndrome)    Low back pain    Thyroid disease     MEDICATIONS: Current Outpatient Medications on File Prior to Visit  Medication Sig Dispense Refill   AMBULATORY NON FORMULARY MEDICATION Take 1 tablet by mouth daily. IBgard: Takes prn     aspirin 81 MG tablet Take 81 mg by mouth daily.      CALCIUM PO Take 1 tablet by mouth 2 (two) times daily.      Cranberry 1000 MG CAPS Take 1,000 mg by mouth daily.     donepezil (ARICEPT) 10 MG tablet Take 1 tablet daily 90 tablet 3   fluticasone (FLONASE) 50 MCG/ACT nasal spray Place 2 sprays into both nostrils daily. (Patient taking differently: Place 2 sprays into both nostrils daily as needed for allergies.) 16 g 5   gabapentin (NEURONTIN) 300 MG capsule Take 1 cap twice a day 180 capsule  3   Lactobacillus-Inulin (CULTURELLE DIGESTIVE DAILY PO) Take 1 tablet by mouth daily.      levothyroxine (SYNTHROID) 50 MCG tablet TAKE 1 TABLET BY MOUTH EVERY DAY BEFORE BREAKFAST 30 tablet 0   loratadine (CLARITIN) 10 MG tablet TAKE 1 TABLET BY MOUTH EVERY DAY 90 tablet 1   memantine (NAMENDA) 10 MG tablet Take 1 tablet (10 mg total) by mouth 2 (two) times daily. 180 tablet 3   midodrine (PROAMATINE) 5 MG tablet TAKE 1 TABLET (5 MG TOTAL) BY MOUTH 3 (THREE) TIMES DAILY WITH MEALS. 270 tablet 1   Multiple Vitamin (MULTIVITAMIN) tablet Take 1 tablet by mouth daily.     nitrofurantoin (MACRODANTIN) 50 MG capsule Take 1 capsule (50 mg total) by mouth at bedtime. 30 capsule 5   sertraline (ZOLOFT) 100 MG tablet Take 1.5 tablets daily (Patient taking differently: Take 100 mg by mouth daily.) 135 tablet 3   VITAMIN D PO Take 1,000 Units by mouth daily.  No current facility-administered medications on file prior to visit.    ALLERGIES: No Known Allergies  FAMILY HISTORY: Family History  Problem Relation Age of Onset   Macular degeneration Father    Prostate cancer Father    Hypertension Father    Cancer Father        prostate   Glaucoma Mother    Emphysema Mother    Hypertension Mother    Other Brother        Brain tumor   Cancer Brother 34       brain tumor   Breast cancer Maternal Grandmother    Prostate cancer Brother    Colon cancer Neg Hx    Colon polyps Neg Hx    Esophageal cancer Neg Hx    Rectal cancer Neg Hx    Stomach cancer Neg Hx     SOCIAL HISTORY: Social History   Socioeconomic History   Marital status: Married    Spouse name: Juanda Crumble, "Butch"   Number of children: 2   Years of education: Not on file   Highest education level: Not on file  Occupational History   Occupation: retired    Fish farm manager: UNEMPLOYED  Tobacco Use   Smoking status: Never   Smokeless tobacco: Never  Vaping Use   Vaping Use: Never used  Substance and Sexual Activity   Alcohol  use: No   Drug use: No   Sexual activity: Yes    Partners: Male    Birth control/protection: Surgical  Other Topics Concern   Not on file  Social History Narrative   Exercise--no   Lives with husband   One story home   Right handed   Social Determinants of Health   Financial Resource Strain: Low Risk    Difficulty of Paying Living Expenses: Not hard at all  Food Insecurity: No Food Insecurity   Worried About Charity fundraiser in the Last Year: Never true   Arboriculturist in the Last Year: Never true  Transportation Needs: Not on file  Physical Activity: Not on file  Stress: Not on file  Social Connections: Not on file  Intimate Partner Violence: Not on file     PHYSICAL EXAM: Vitals:   02/25/21 1552  BP: 118/75  Pulse: 88  Resp: 18  SpO2: 98%   General: No acute distress Head:  Normocephalic/atraumatic Skin/Extremities: No rash, no edema Neurological Exam: alert and oriented to person. Reduced fluency, some delay in response (hearing loss also contributing). No dysarthria. Fund of knowledge is reduced.  Recent and remote memory are impaired.  Attention and concentration are reduced.   Cranial nerves: Pupils equal, round. Extraocular movements intact with no nystagmus. Visual fields full.  No facial asymmetry.  Motor: Bulk and tone normal, muscle strength 5/5 throughout with no pronator drift.   Finger to nose testing intact.  Gait narrow-based and steady, no ataxia. No tremors.   IMPRESSION: This is a pleasant 75 yo RH woman with a history of hypertension, hyperlipidemia, hypothyroidism, with moderate dementia, likely due to Alzheimer's disease with behavioral change. Behaviors overall stable on Sertraline 100mg  daily (she had side effects on higher dose). Continue Donepezil 10mg  daily, Memantine 10mg  BID. Continue gabapentin 300mg  BID for headache prophylaxis. Continue 24/7 care, her husband was encouraged to look into day programs. Caregiver support provided. Follow-up  in 6 months, they know to call for any changes.     Thank you for allowing me to participate in her care.  Please do not hesitate  to call for any questions or concerns.    Michelle [name], M.D.   CC: Dr. Cheri Rous

## 2021-02-25 NOTE — Patient Instructions (Signed)
Good to see you. Continue all your medications. Continue close supervision. Recommend looking into day programs. Follow-up in 6 months, call for any changes

## 2021-02-28 ENCOUNTER — Other Ambulatory Visit: Payer: Self-pay | Admitting: Family Medicine

## 2021-02-28 DIAGNOSIS — E039 Hypothyroidism, unspecified: Secondary | ICD-10-CM

## 2021-03-03 ENCOUNTER — Other Ambulatory Visit: Payer: Self-pay | Admitting: Family Medicine

## 2021-03-28 ENCOUNTER — Other Ambulatory Visit: Payer: Self-pay | Admitting: Family Medicine

## 2021-03-28 DIAGNOSIS — E039 Hypothyroidism, unspecified: Secondary | ICD-10-CM

## 2021-04-08 ENCOUNTER — Emergency Department (HOSPITAL_COMMUNITY): Payer: Medicare Other

## 2021-04-08 ENCOUNTER — Inpatient Hospital Stay (HOSPITAL_COMMUNITY)
Admission: EM | Admit: 2021-04-08 | Discharge: 2021-04-12 | DRG: 082 | Disposition: A | Discharge status: Rehabilitation Facility | Payer: Medicare Other | Attending: Internal Medicine | Admitting: Internal Medicine

## 2021-04-08 ENCOUNTER — Encounter (HOSPITAL_COMMUNITY): Payer: Self-pay | Admitting: Family Medicine

## 2021-04-08 DIAGNOSIS — S06309A Unspecified focal traumatic brain injury with loss of consciousness of unspecified duration, initial encounter: Secondary | ICD-10-CM | POA: Diagnosis present

## 2021-04-08 DIAGNOSIS — G319 Degenerative disease of nervous system, unspecified: Secondary | ICD-10-CM | POA: Diagnosis not present

## 2021-04-08 DIAGNOSIS — N390 Urinary tract infection, site not specified: Secondary | ICD-10-CM | POA: Diagnosis not present

## 2021-04-08 DIAGNOSIS — Z043 Encounter for examination and observation following other accident: Secondary | ICD-10-CM | POA: Diagnosis not present

## 2021-04-08 DIAGNOSIS — I951 Orthostatic hypotension: Secondary | ICD-10-CM | POA: Diagnosis not present

## 2021-04-08 DIAGNOSIS — K219 Gastro-esophageal reflux disease without esophagitis: Secondary | ICD-10-CM | POA: Diagnosis present

## 2021-04-08 DIAGNOSIS — R55 Syncope and collapse: Secondary | ICD-10-CM | POA: Diagnosis not present

## 2021-04-08 DIAGNOSIS — R402142 Coma scale, eyes open, spontaneous, at arrival to emergency department: Secondary | ICD-10-CM | POA: Diagnosis not present

## 2021-04-08 DIAGNOSIS — F02818 Dementia in other diseases classified elsewhere, unspecified severity, with other behavioral disturbance: Secondary | ICD-10-CM | POA: Diagnosis present

## 2021-04-08 DIAGNOSIS — R402242 Coma scale, best verbal response, confused conversation, at arrival to emergency department: Secondary | ICD-10-CM | POA: Diagnosis not present

## 2021-04-08 DIAGNOSIS — E039 Hypothyroidism, unspecified: Secondary | ICD-10-CM | POA: Diagnosis present

## 2021-04-08 DIAGNOSIS — B9689 Other specified bacterial agents as the cause of diseases classified elsewhere: Secondary | ICD-10-CM | POA: Diagnosis not present

## 2021-04-08 DIAGNOSIS — F0280 Dementia in other diseases classified elsewhere without behavioral disturbance: Secondary | ICD-10-CM | POA: Diagnosis not present

## 2021-04-08 DIAGNOSIS — I6529 Occlusion and stenosis of unspecified carotid artery: Secondary | ICD-10-CM | POA: Diagnosis not present

## 2021-04-08 DIAGNOSIS — W010XXA Fall on same level from slipping, tripping and stumbling without subsequent striking against object, initial encounter: Secondary | ICD-10-CM | POA: Diagnosis present

## 2021-04-08 DIAGNOSIS — Z8249 Family history of ischemic heart disease and other diseases of the circulatory system: Secondary | ICD-10-CM

## 2021-04-08 DIAGNOSIS — S0990XA Unspecified injury of head, initial encounter: Secondary | ICD-10-CM | POA: Diagnosis not present

## 2021-04-08 DIAGNOSIS — M4802 Spinal stenosis, cervical region: Secondary | ICD-10-CM | POA: Diagnosis not present

## 2021-04-08 DIAGNOSIS — M47814 Spondylosis without myelopathy or radiculopathy, thoracic region: Secondary | ICD-10-CM | POA: Diagnosis not present

## 2021-04-08 DIAGNOSIS — S065X0D Traumatic subdural hemorrhage without loss of consciousness, subsequent encounter: Secondary | ICD-10-CM | POA: Diagnosis not present

## 2021-04-08 DIAGNOSIS — A499 Bacterial infection, unspecified: Secondary | ICD-10-CM | POA: Diagnosis not present

## 2021-04-08 DIAGNOSIS — N182 Chronic kidney disease, stage 2 (mild): Secondary | ICD-10-CM

## 2021-04-08 DIAGNOSIS — G8929 Other chronic pain: Secondary | ICD-10-CM | POA: Diagnosis not present

## 2021-04-08 DIAGNOSIS — H9190 Unspecified hearing loss, unspecified ear: Secondary | ICD-10-CM | POA: Diagnosis present

## 2021-04-08 DIAGNOSIS — S065X9A Traumatic subdural hemorrhage with loss of consciousness of unspecified duration, initial encounter: Principal | ICD-10-CM | POA: Diagnosis present

## 2021-04-08 DIAGNOSIS — E785 Hyperlipidemia, unspecified: Secondary | ICD-10-CM | POA: Diagnosis present

## 2021-04-08 DIAGNOSIS — M19012 Primary osteoarthritis, left shoulder: Secondary | ICD-10-CM | POA: Diagnosis not present

## 2021-04-08 DIAGNOSIS — H409 Unspecified glaucoma: Secondary | ICD-10-CM | POA: Diagnosis not present

## 2021-04-08 DIAGNOSIS — Z743 Need for continuous supervision: Secondary | ICD-10-CM | POA: Diagnosis not present

## 2021-04-08 DIAGNOSIS — I129 Hypertensive chronic kidney disease with stage 1 through stage 4 chronic kidney disease, or unspecified chronic kidney disease: Secondary | ICD-10-CM | POA: Diagnosis not present

## 2021-04-08 DIAGNOSIS — I629 Nontraumatic intracranial hemorrhage, unspecified: Secondary | ICD-10-CM

## 2021-04-08 DIAGNOSIS — S066X0A Traumatic subarachnoid hemorrhage without loss of consciousness, initial encounter: Secondary | ICD-10-CM | POA: Diagnosis not present

## 2021-04-08 DIAGNOSIS — R410 Disorientation, unspecified: Secondary | ICD-10-CM | POA: Diagnosis not present

## 2021-04-08 DIAGNOSIS — R402362 Coma scale, best motor response, obeys commands, at arrival to emergency department: Secondary | ICD-10-CM | POA: Diagnosis present

## 2021-04-08 DIAGNOSIS — S06301A Unspecified focal traumatic brain injury with loss of consciousness of 30 minutes or less, initial encounter: Secondary | ICD-10-CM | POA: Diagnosis not present

## 2021-04-08 DIAGNOSIS — Z83511 Family history of glaucoma: Secondary | ICD-10-CM

## 2021-04-08 DIAGNOSIS — Z20822 Contact with and (suspected) exposure to covid-19: Secondary | ICD-10-CM | POA: Diagnosis not present

## 2021-04-08 DIAGNOSIS — Z86011 Personal history of benign neoplasm of the brain: Secondary | ICD-10-CM | POA: Diagnosis not present

## 2021-04-08 DIAGNOSIS — S06360A Traumatic hemorrhage of cerebrum, unspecified, without loss of consciousness, initial encounter: Secondary | ICD-10-CM | POA: Diagnosis not present

## 2021-04-08 DIAGNOSIS — M7542 Impingement syndrome of left shoulder: Secondary | ICD-10-CM | POA: Diagnosis not present

## 2021-04-08 DIAGNOSIS — S060X9A Concussion with loss of consciousness of unspecified duration, initial encounter: Secondary | ICD-10-CM

## 2021-04-08 DIAGNOSIS — Z7982 Long term (current) use of aspirin: Secondary | ICD-10-CM

## 2021-04-08 DIAGNOSIS — D62 Acute posthemorrhagic anemia: Secondary | ICD-10-CM | POA: Diagnosis not present

## 2021-04-08 DIAGNOSIS — S065X2S Traumatic subdural hemorrhage with loss of consciousness of 31 minutes to 59 minutes, sequela: Secondary | ICD-10-CM | POA: Diagnosis not present

## 2021-04-08 DIAGNOSIS — Z79899 Other long term (current) drug therapy: Secondary | ICD-10-CM

## 2021-04-08 DIAGNOSIS — S065X0A Traumatic subdural hemorrhage without loss of consciousness, initial encounter: Secondary | ICD-10-CM | POA: Diagnosis not present

## 2021-04-08 DIAGNOSIS — R519 Headache, unspecified: Secondary | ICD-10-CM | POA: Diagnosis not present

## 2021-04-08 DIAGNOSIS — Z66 Do not resuscitate: Secondary | ICD-10-CM | POA: Diagnosis present

## 2021-04-08 DIAGNOSIS — W19XXXA Unspecified fall, initial encounter: Secondary | ICD-10-CM

## 2021-04-08 DIAGNOSIS — S066X9A Traumatic subarachnoid hemorrhage with loss of consciousness of unspecified duration, initial encounter: Secondary | ICD-10-CM | POA: Diagnosis present

## 2021-04-08 DIAGNOSIS — F0281 Dementia in other diseases classified elsewhere with behavioral disturbance: Secondary | ICD-10-CM | POA: Diagnosis present

## 2021-04-08 DIAGNOSIS — G301 Alzheimer's disease with late onset: Secondary | ICD-10-CM | POA: Diagnosis not present

## 2021-04-08 DIAGNOSIS — S199XXA Unspecified injury of neck, initial encounter: Secondary | ICD-10-CM | POA: Diagnosis not present

## 2021-04-08 DIAGNOSIS — S065X0S Traumatic subdural hemorrhage without loss of consciousness, sequela: Secondary | ICD-10-CM | POA: Diagnosis not present

## 2021-04-08 DIAGNOSIS — M4322 Fusion of spine, cervical region: Secondary | ICD-10-CM | POA: Diagnosis not present

## 2021-04-08 DIAGNOSIS — G309 Alzheimer's disease, unspecified: Secondary | ICD-10-CM | POA: Diagnosis not present

## 2021-04-08 DIAGNOSIS — E43 Unspecified severe protein-calorie malnutrition: Secondary | ICD-10-CM | POA: Diagnosis not present

## 2021-04-08 DIAGNOSIS — Z79890 Hormone replacement therapy (postmenopausal): Secondary | ICD-10-CM

## 2021-04-08 DIAGNOSIS — G9341 Metabolic encephalopathy: Secondary | ICD-10-CM | POA: Diagnosis not present

## 2021-04-08 DIAGNOSIS — M7541 Impingement syndrome of right shoulder: Secondary | ICD-10-CM | POA: Diagnosis not present

## 2021-04-08 DIAGNOSIS — S0630AA Unspecified focal traumatic brain injury with loss of consciousness status unknown, initial encounter: Secondary | ICD-10-CM | POA: Diagnosis present

## 2021-04-08 LAB — CBC WITH DIFFERENTIAL/PLATELET
Abs Immature Granulocytes: 0.04 10*3/uL (ref 0.00–0.07)
Basophils Absolute: 0 10*3/uL (ref 0.0–0.1)
Basophils Relative: 0 %
Eosinophils Absolute: 0 10*3/uL (ref 0.0–0.5)
Eosinophils Relative: 1 %
HCT: 34.1 % — ABNORMAL LOW (ref 36.0–46.0)
Hemoglobin: 11.1 g/dL — ABNORMAL LOW (ref 12.0–15.0)
Immature Granulocytes: 1 %
Lymphocytes Relative: 9 %
Lymphs Abs: 0.8 10*3/uL (ref 0.7–4.0)
MCH: 29.1 pg (ref 26.0–34.0)
MCHC: 32.6 g/dL (ref 30.0–36.0)
MCV: 89.5 fL (ref 80.0–100.0)
Monocytes Absolute: 0.6 10*3/uL (ref 0.1–1.0)
Monocytes Relative: 7 %
Neutro Abs: 7.1 10*3/uL (ref 1.7–7.7)
Neutrophils Relative %: 82 %
Platelets: 241 10*3/uL (ref 150–400)
RBC: 3.81 MIL/uL — ABNORMAL LOW (ref 3.87–5.11)
RDW: 12 % (ref 11.5–15.5)
WBC: 8.6 10*3/uL (ref 4.0–10.5)
nRBC: 0 % (ref 0.0–0.2)

## 2021-04-08 LAB — RESP PANEL BY RT-PCR (FLU A&B, COVID) ARPGX2
Influenza A by PCR: NEGATIVE
Influenza B by PCR: NEGATIVE
SARS Coronavirus 2 by RT PCR: NEGATIVE

## 2021-04-08 LAB — BASIC METABOLIC PANEL
Anion gap: 9 (ref 5–15)
BUN: 20 mg/dL (ref 8–23)
CO2: 27 mmol/L (ref 22–32)
Calcium: 8.9 mg/dL (ref 8.9–10.3)
Chloride: 103 mmol/L (ref 98–111)
Creatinine, Ser: 1.06 mg/dL — ABNORMAL HIGH (ref 0.44–1.00)
GFR, Estimated: 55 mL/min — ABNORMAL LOW (ref >60)
Glucose, Bld: 119 mg/dL — ABNORMAL HIGH (ref 70–99)
Potassium: 4 mmol/L (ref 3.5–5.1)
Sodium: 139 mmol/L (ref 135–145)

## 2021-04-08 MED ORDER — ACETAMINOPHEN 325 MG PO TABS
650.0000 mg | ORAL_TABLET | Freq: Four times a day (QID) | ORAL | Status: DC | PRN
  Filled 2021-04-08: qty 2

## 2021-04-08 MED ORDER — ACETAMINOPHEN 650 MG RE SUPP: 650.0000 mg | Freq: Four times a day (QID) | RECTAL | Status: DC | PRN

## 2021-04-08 MED ORDER — SODIUM CHLORIDE 0.9 % IV SOLN: INTRAVENOUS | Status: DC

## 2021-04-08 MED ORDER — SENNOSIDES-DOCUSATE SODIUM 8.6-50 MG PO TABS: 1.0000 | ORAL_TABLET | Freq: Every evening | ORAL | Status: DC | PRN

## 2021-04-08 MED ORDER — ONDANSETRON HCL 4 MG PO TABS: 4.0000 mg | ORAL_TABLET | Freq: Four times a day (QID) | ORAL | Status: DC | PRN

## 2021-04-08 MED ORDER — ONDANSETRON HCL 4 MG/2ML IJ SOLN: 4.0000 mg | Freq: Four times a day (QID) | INTRAMUSCULAR | Status: DC | PRN

## 2021-04-08 MED ORDER — ACETAMINOPHEN 325 MG PO TABS
650.0000 mg | ORAL_TABLET | Freq: Once | ORAL | Status: AC
  Administered 2021-04-08: 650 mg via ORAL
  Filled 2021-04-08: qty 2

## 2021-04-08 NOTE — ED Provider Notes (Signed)
Michelle [name] DEPT Provider Note   CSN: [id number] Arrival date & time: 04/08/21  2027     History Chief Complaint  Patient presents with   Michelle [name]    Michelle [name] is a 75 y.o. female.  Level 5 caveat due to dementia.  Patient with fall at home hit the left side of her head with hematoma.  She denies any pain or discomfort.  No obvious extremity deformities.  The history is provided by the EMS personnel.  Fall This is a new problem. Pertinent negatives include no chest pain, no abdominal pain, no headaches and no shortness of breath. Nothing aggravates the symptoms. Nothing relieves the symptoms.      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/2019   Syncope 10/12/2019   Pain in joint of right shoulder 09/20/2019   Impingement syndrome of right shoulder region 09/20/2019   Viral upper respiratory tract infection 08/24/2019   Pain of joint of left ankle and foot 03/18/2019   Peroneal mononeuropathy, left 12/15/2016   Preventative health care 10/31/2016   Left foot drop 10/14/2016   Weight loss 10/14/2016   Tension-type headache, not intractable 09/17/2016   Meningioma ([place]) 06/05/2016   Mild cognitive impairment 04/02/2016   Depression 04/02/2016   Memory loss 03/21/2016   Asymptomatic postmenopausal status 07/26/2009   INTESTINAL GAS 09/18/2008   ONYCHOMYCOSIS, TOENAILS 08/14/2008   Unspecified glaucoma 07/24/2008    Hypertensive disorder 07/24/2008   MYALGIA 02/29/2008   COUGH 07/26/2007   ADENOMATOUS COLONIC POLYP 05/20/2007   DIVERTICULOSIS, COLON 05/20/2007   Hypothyroidism 04/07/2007   Hypercholesterolemia 04/07/2007   GERD 04/07/2007   IBS 04/07/2007    Past Surgical History:  Procedure Laterality Date   ABDOMINAL HYSTERECTOMY  1998   CATARACT EXTRACTION, BILATERAL Bilateral 04/2015   CHOLECYSTECTOMY  1998   COLONOSCOPY  12/10/2016   GUM SURGERY  1996 / 2013   skin graft on gums   TUBAL LIGATION  1977     OB History   No obstetric history on file.     Family History  Problem Relation Age of Onset   Macular degeneration Father    Prostate cancer Father    Hypertension Father    Cancer Father        prostate   Glaucoma Mother    Emphysema Mother    Hypertension Mother    Other Brother        Brain tumor   Cancer Brother 65       brain tumor   Breast cancer Maternal Grandmother    Prostate cancer Brother    Colon cancer Neg Hx    Colon polyps Neg Hx    Esophageal cancer Neg Hx    Rectal cancer Neg Hx    Stomach cancer Neg Hx     Social History   Tobacco Use   Smoking status: Never  Smokeless tobacco: Never  Vaping Use   Vaping Use: Never used  Substance Use Topics   Alcohol use: No   Drug use: No    Home Medications Prior to Admission medications   Medication Sig Start Date End Date Taking? Authorizing Provider  AMBULATORY NON FORMULARY MEDICATION Take 1 tablet by mouth daily. IBgard: Takes prn    [provider]  aspirin 81 MG tablet Take 81 mg by mouth daily.     [provider]  CALCIUM PO Take 1 tablet by mouth 2 (two) times daily.     [provider]  Cranberry 1000 MG CAPS Take 1,000 mg by mouth daily.    [provider]  donepezil (ARICEPT) 10 MG tablet Take 1 tablet daily 02/25/21   Cameron Sprang, MD  fluticasone [facility]) 50 MCG/ACT nasal spray Place 2 sprays into both nostrils daily. Patient taking  differently: Place 2 sprays into both nostrils daily as needed for allergies. 07/10/17   Collene Gobble, MD  gabapentin (NEURONTIN) 300 MG capsule Take 1 cap twice a day 02/25/21   Cameron Sprang, MD  Lactobacillus-Inulin (CULTURELLE DIGESTIVE DAILY PO) Take 1 tablet by mouth daily.     [provider]  levothyroxine (SYNTHROID) 50 MCG tablet TAKE 1 TABLET BY MOUTH EVERY DAY BEFORE BREAKFAST 02/28/21   Carollee Herter, Yvonne R, DO  loratadine (CLARITIN) 10 MG tablet TAKE 1 TABLET BY MOUTH EVERY DAY 05/23/20   Carollee Herter, Alferd Apa, DO  memantine (NAMENDA) 10 MG tablet Take 1 tablet (10 mg total) by mouth 2 (two) times daily. 02/25/21   Cameron Sprang, MD  midodrine (PROAMATINE) 5 MG tablet TAKE 1 TABLET (5 MG TOTAL) BY MOUTH 3 (THREE) TIMES DAILY WITH MEALS. 03/04/21   Ann Held, DO  Multiple Vitamin (MULTIVITAMIN) tablet Take 1 tablet by mouth daily.    [provider]  nitrofurantoin (MACRODANTIN) 50 MG capsule Take 1 capsule (50 mg total) by mouth at bedtime. 08/21/20   Ann Held, DO  sertraline (ZOLOFT) 100 MG tablet Take 1 tablet (100 mg total) by mouth daily. 02/25/21   Cameron Sprang, MD  VITAMIN D PO Take 1,000 Units by mouth daily.     [provider]    Allergies    Patient has no known allergies.  Review of Systems   Review of Systems  Unable to perform ROS: Dementia  Respiratory:  Negative for shortness of breath.   Cardiovascular:  Negative for chest pain.  Gastrointestinal:  Negative for abdominal pain.  Neurological:  Negative for headaches.   Physical Exam Updated Vital Signs BP (!) 148/75   Pulse 76   Temp 97.9 F (36.6 C) (Oral)   Resp 16   SpO2 100%   Physical Exam Vitals and nursing note reviewed.  Constitutional:      General: She is not in acute distress.    Appearance: She is well-developed. She is not ill-appearing.  HENT:     Head:     Comments: Hematoma over the left side of the forehead    Nose: Nose  normal.     Mouth/Throat:     Mouth: Mucous membranes are moist.  Eyes:     Extraocular Movements: Extraocular movements intact.     Conjunctiva/sclera: Conjunctivae normal.     Pupils: Pupils are equal, round, and reactive to light.  Cardiovascular:     Rate and Rhythm: Normal rate and regular rhythm.     Pulses: Normal  pulses.     Heart sounds: Normal heart sounds. No murmur heard. Pulmonary:     Effort: Pulmonary effort is normal. No respiratory distress.     Breath sounds: Normal breath sounds.  Abdominal:     Palpations: Abdomen is soft.     Tenderness: There is no abdominal tenderness.  Musculoskeletal:        General: No tenderness. Normal range of motion.     Cervical back: Normal range of motion and neck supple. No tenderness.  Skin:    General: Skin is warm and dry.     Capillary Refill: Capillary refill takes less than 2 seconds.  Neurological:     General: No focal deficit present.     Mental Status: She is alert.     Cranial Nerves: No cranial nerve deficit.     Sensory: No sensory deficit.     Motor: No weakness.     Coordination: Coordination normal.  Psychiatric:        Mood and Affect: Mood normal.    ED Results / Procedures / Treatments   Labs (all labs ordered are listed, but only abnormal results are displayed) Labs Reviewed  CBC WITH DIFFERENTIAL/PLATELET - Abnormal; Notable for the following components:      Result Value   RBC 3.81 (*)    Hemoglobin 11.1 (*)    HCT 34.1 (*)    All other components within normal limits  BASIC METABOLIC PANEL - Abnormal; Notable for the following components:   Glucose, Bld 119 (*)    Creatinine, Ser 1.06 (*)    GFR, Estimated 55 (*)    All other components within normal limits  RESP PANEL BY RT-PCR (FLU A&B, COVID) [id number]    EKG None  Radiology CT HEAD WO CONTRAST (5MM)  Result Date: 04/08/2021 CLINICAL DATA:  Fall, left head strike EXAM: CT HEAD WITHOUT CONTRAST CT CERVICAL SPINE WITHOUT CONTRAST  TECHNIQUE: Multidetector CT imaging of the head and cervical spine was performed following the standard protocol without intravenous contrast. Multiplanar CT image reconstructions of the cervical spine were also generated. COMPARISON:  None. FINDINGS: CT HEAD FINDINGS Brain: Multiple sites of hyperdense hemorrhage including: Subdural hemorrhage along the right frontal convexity measuring 4 mm in maximal thickness (6/23). Hyperdense subdural hemorrhage seen along the falx measuring up to 6 mm in maximal thickness Hyperdense subdural hemorrhage along the tentorium as well. Small intraparenchymal hemorrhage noted in the high left frontal lobe (3/25). Subarachnoid hemorrhage along the sulci of the left frontal lobe along the sylvian fissure (3/15). No significant midline shift or resulting mass effect. No CT evident large vascular territory or cortically based infarct. No focal lesion. Vascular: Atherosclerotic calcification of the carotid siphons. No hyperdense vessel. Skull: Minimal left frontal scalp thickening. No large hematoma. No calvarial fracture. No other acute osseous abnormality. Sinuses/Orbits: Paranasal sinuses and mastoid air cells are predominantly clear. Orbital structures are unremarkable aside from prior lens extractions. Other: None CT CERVICAL SPINE FINDINGS Alignment: Cervical stabilization collar absent the time exam. No evidence of traumatic listhesis. No abnormally widened, perched or jumped facets. Normal alignment of the craniocervical and atlantoaxial articulations. Skull base and vertebrae: Motion degraded imaging may limit detection of subtle abnormalities. Bony fusion across the left C3-4 facets. Multilevel cervical spondylitic changes further detailed below. Additional arthrosis at the atlantodental and basion dens intervals. Soft tissues and spinal canal: No pre or paravertebral fluid or swelling. No visible canal hematoma. 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 Airways patent. No  acute traumatic soft tissue abnormality of the neck. No suspicious adenopathy. Disc levels: Multilevel intervertebral disc height loss with spondylitic endplate changes. Ossification of posterior longitudinal ligament C5-C7 resulting in some mild to moderate canal stenosis. Additional disc osteophyte complexes partially effacing the ventral thecal sac without significant canal impingement. Multilevel uncinate spurring facet hypertrophic changes resulting in some mild to moderate multilevel neural foraminal narrowing most pronounced C4-5 right changes are more moderate to severe. Upper chest: No acute abnormality in the upper chest or imaged lung apices. Other: Normal thyroid. IMPRESSION: Left frontal scalp swelling without calvarial fracture. Sites of intracranial hemorrhage including subdural hemorrhage along the right frontal convexity and in a para falcine and tentorial distribution, left frontal intraparenchymal hemorrhage and left frontal subarachnoid hemorrhage. No significant resulting mass effect or midline shift. Imaging cervical spine imaging degraded by motion artifact. No acute cervical spine fracture or traumatic listhesis. Cervical spondylitic changes, as detailed above. 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 These results were called by telephone at the time of interpretation on 04/08/2021 at 9:22 pm to provider Alexis Mizuno , who verbally acknowledged these results. Electronically Signed   By: Lovena Le M.D.   On: 04/08/2021 21:22   CT Cervical Spine Wo Contrast  Result Date: 04/08/2021 CLINICAL DATA:  Fall, left head strike EXAM: CT HEAD WITHOUT CONTRAST CT CERVICAL SPINE WITHOUT CONTRAST TECHNIQUE: Multidetector CT imaging of the head and cervical spine was performed following the standard protocol without intravenous contrast. Multiplanar CT image reconstructions of the cervical spine were also generated. COMPARISON:  None. FINDINGS: CT  HEAD FINDINGS Brain: Multiple sites of hyperdense hemorrhage including: Subdural hemorrhage along the right frontal convexity measuring 4 mm in maximal thickness (6/23). Hyperdense subdural hemorrhage seen along the falx measuring up to 6 mm in maximal thickness Hyperdense subdural hemorrhage along the tentorium as well. Small intraparenchymal hemorrhage noted in the high left frontal lobe (3/25). Subarachnoid hemorrhage along the sulci of the left frontal lobe along the sylvian fissure (3/15). No significant midline shift or resulting mass effect. No CT evident large vascular territory or cortically based infarct. No focal lesion. Vascular: Atherosclerotic calcification of the carotid siphons. No hyperdense vessel. Skull: Minimal left frontal scalp thickening. No large hematoma. No calvarial fracture. No other acute osseous abnormality. Sinuses/Orbits: Paranasal sinuses and mastoid air cells are predominantly clear. Orbital structures are unremarkable aside from prior lens extractions. Other: None CT CERVICAL SPINE FINDINGS Alignment: Cervical stabilization collar absent the time exam. No evidence of traumatic listhesis. No abnormally widened, perched or jumped facets. Normal alignment of the craniocervical and atlantoaxial articulations. Skull base and vertebrae: Motion degraded imaging may limit detection of subtle abnormalities. Bony fusion across the left C3-4 facets. Multilevel cervical spondylitic changes further detailed below. Additional arthrosis at the atlantodental and basion dens intervals. Soft tissues and spinal canal: No pre or paravertebral fluid or swelling. No visible canal hematoma. 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 Airways patent. No acute traumatic soft tissue abnormality of the neck. No suspicious adenopathy. Disc levels: Multilevel intervertebral disc height loss with spondylitic endplate changes. Ossification of posterior longitudinal ligament C5-C7  resulting in some mild to moderate canal stenosis. Additional disc osteophyte complexes partially effacing the ventral thecal sac without significant canal impingement. Multilevel uncinate spurring facet hypertrophic changes resulting in some mild to moderate multilevel neural foraminal narrowing most pronounced C4-5 right changes are more moderate to severe. Upper chest: No acute abnormality in the upper chest or imaged lung apices. Other: Normal thyroid.  IMPRESSION: Left frontal scalp swelling without calvarial fracture. Sites of intracranial hemorrhage including subdural hemorrhage along the right frontal convexity and in a para falcine and tentorial distribution, left frontal intraparenchymal hemorrhage and left frontal subarachnoid hemorrhage. No significant resulting mass effect or midline shift. Imaging cervical spine imaging degraded by motion artifact. No acute cervical spine fracture or traumatic listhesis. Cervical spondylitic changes, as detailed above. 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 These results were called by telephone at the time of interpretation on 04/08/2021 at 9:22 pm to provider Arbutus Nelligan , who verbally acknowledged these results. Electronically Signed   By: Lovena Le M.D.   On: 04/08/2021 21:22    Procedures .Critical Care  Date/Time: 04/08/2021 10:10 PM Performed by: Lennice Sites, DO Authorized by: Lennice Sites, DO   Critical care provider statement:    Critical care time (minutes):  35   Critical care was necessary to treat or prevent imminent or life-threatening deterioration of the following conditions:  CNS failure or compromise   Critical care was time spent personally by me on the following activities:  Blood draw for specimens, development of treatment plan with patient or surrogate, discussions with consultants, discussions with primary provider, evaluation of patient's response to treatment, examination of patient, obtaining  history from patient or surrogate, ordering and performing treatments and interventions, ordering and review of laboratory studies, ordering and review of radiographic studies, pulse oximetry, re-evaluation of patient's condition and review of old charts   Care discussed with: admitting provider     Medications Ordered in ED Medications  acetaminophen (TYLENOL) tablet 650 mg (has no administration in time range)    ED Course  I have reviewed the triage vital signs and the nursing notes.  Pertinent labs & imaging results that were available during my care of the patient were reviewed by me and considered in my medical decision making (see chart for details).    MDM Rules/Calculators/A&P                           Michelle [name] is a 75 year old female with history of dementia who presents to the ED after mechanical fall.  This was witnessed by her husband.  States that she tripped over a baby gate hitting the left side of her head.  She does take aspirin.  Patient has dementia, does have DNR.  She does not endorse any pain but does have a mild headache.  She does appear to have a hematoma over the left side of her head.  Radiology called me on the phone as patient does have several sites of intracranial hemorrhage including a subdural hemorrhage along the right frontal convexity and parafalcine and tentorial area.  Does have a left frontal intraparenchymal hemorrhage and left frontal subarachnoid hemorrhage as well.  No mass-effect or midline shift.  No cervical spine injuries.  Medical screening labs were overall unremarkable.  Had discussion with family at the bedside.  They would not want any major surgeries or intervention but I believe she would benefit from physical therapy evaluation.  She typically ambulates with a walker at home they have noticed some issues with her gait recently.  We will touch base with neurosurgery, Dr. Christella Noa on call but overall nothing surgical and family would not  want intervention.  Will admit to hospitalist for further care.  This chart was dictated using voice recognition software.  Despite best efforts to proofread,  errors can occur which  can change the documentation meaning.   Final Clinical Impression(s) / ED Diagnoses Final diagnoses:  Intracranial hemorrhage ([place])  Fall, initial encounter  Concussion with loss of consciousness, initial encounter    Rx / DC Orders ED Discharge Orders     None        Lennice Sites, DO 04/08/21 2213

## 2021-04-08 NOTE — ED Triage Notes (Signed)
Pt BIB by EMS due to having a fall. Per EMS, pt struck left side of head during fall. Pt has no complaints at this time. Pt has dementia at baseline.

## 2021-04-08 NOTE — H&P (Addendum)
History and Physical    Michelle [name] [id number] DOB: 04/27/1946 DOA: 04/08/2021  PCP: Ann Held, DO   Patient coming from: Home   Chief Complaint: Fall, hit head   HPI: Michelle [name] is a 75 y.o. female with medical history significant for dementia with behavioral disturbance, orthostatic hypotension, seasonal allergy, and hypothyroidism, now presenting to the emergency department after falling and hitting her head.  She is accompanied by her husband who provides much of the history.  Patient had been in her usual state of health when she fell tonight, appeared to hit her forehead on the ground, and was briefly unconscious.  She began to mumble in response to questions from family and gradually became more alert over the next 20 minutes.  She was complaining of a headache after this.  She has baseline dementia, is able to dress herself, but requires assistance from her husband with bathing and meals.  She is oriented to person only at baseline.  She has chronic headaches and was started on gabapentin for prophylaxis.  ED Course: Upon arrival to the ED, patient is found to be afebrile, saturating well on room air, and with stable blood pressure.  Chemistry panel with mild renal insufficiency and CBC with hemoglobin 11.1.  CT cervical spine negative for acute fracture or traumatic listhesis.  Head CT with subdural hemorrhage along the right frontal convexity, along the falx, and along the tentorium.  Also noted on head CT small intraparenchymal hemorrhage in the high left frontal lobe and subarachnoid hemorrhage along the sulci of the left frontal lobe and sylvian fissure.  There is no significant midline shift or mass-effect on CT.  Neurosurgery was consulted by the ED physician and the patient was treated with Tylenol.  Review of Systems:  All other systems reviewed and apart from HPI, are negative.  Past Medical History:  Diagnosis Date   Adenomatous colon polyp    Allergy     Dementia ([place])    GERD (gastroesophageal reflux disease)    Glaucoma    Hx of hearing loss    bilateral hearing aids   Hyperlipidemia    Hypertension    IBS (irritable bowel syndrome)    Low back pain    Thyroid disease     Past Surgical History:  Procedure Laterality Date   ABDOMINAL HYSTERECTOMY  1998   CATARACT EXTRACTION, BILATERAL Bilateral 04/2015   CHOLECYSTECTOMY  1998   COLONOSCOPY  12/10/2016   GUM SURGERY  1996 / 2013   skin graft on gums   TUBAL LIGATION  1977    Social History:   reports that she has never smoked. She has never used smokeless tobacco. She reports that she does not drink alcohol and does not use drugs.  No Known Allergies  Family History  Problem Relation Age of Onset   Macular degeneration Father    Prostate cancer Father    Hypertension Father    Cancer Father        prostate   Glaucoma Mother    Emphysema Mother    Hypertension Mother    Other Brother        Brain tumor   Cancer Brother 45       brain tumor   Breast cancer Maternal Grandmother    Prostate cancer Brother    Colon cancer Neg Hx    Colon polyps Neg Hx    Esophageal cancer Neg Hx    Rectal cancer Neg Hx  Stomach cancer Neg Hx      Prior to Admission medications   Medication Sig Start Date End Date Taking? Authorizing Provider  AMBULATORY NON FORMULARY MEDICATION Take 1 tablet by mouth daily. IBgard: Takes prn    [provider]  aspirin 81 MG tablet Take 81 mg by mouth daily.     [provider]  CALCIUM PO Take 1 tablet by mouth 2 (two) times daily.     [provider]  Cranberry 1000 MG CAPS Take 1,000 mg by mouth daily.    [provider]  donepezil (ARICEPT) 10 MG tablet Take 1 tablet daily 02/25/21   Cameron Sprang, MD  fluticasone [facility]) 50 MCG/ACT nasal spray Place 2 sprays into both nostrils daily. Patient taking differently: Place 2 sprays into both nostrils daily as needed for allergies. 07/10/17   Collene Gobble, MD  gabapentin (NEURONTIN) 300 MG capsule Take 1 cap twice a day 02/25/21   Cameron Sprang, MD  Lactobacillus-Inulin (CULTURELLE DIGESTIVE DAILY PO) Take 1 tablet by mouth daily.     [provider]  levothyroxine (SYNTHROID) 50 MCG tablet TAKE 1 TABLET BY MOUTH EVERY DAY BEFORE BREAKFAST 02/28/21   Carollee Herter, Yvonne R, DO  loratadine (CLARITIN) 10 MG tablet TAKE 1 TABLET BY MOUTH EVERY DAY 05/23/20   Carollee Herter, Alferd Apa, DO  memantine (NAMENDA) 10 MG tablet Take 1 tablet (10 mg total) by mouth 2 (two) times daily. 02/25/21   Cameron Sprang, MD  midodrine (PROAMATINE) 5 MG tablet TAKE 1 TABLET (5 MG TOTAL) BY MOUTH 3 (THREE) TIMES DAILY WITH MEALS. 03/04/21   Ann Held, DO  Multiple Vitamin (MULTIVITAMIN) tablet Take 1 tablet by mouth daily.    [provider]  nitrofurantoin (MACRODANTIN) 50 MG capsule Take 1 capsule (50 mg total) by mouth at bedtime. 08/21/20   Ann Held, DO  sertraline (ZOLOFT) 100 MG tablet Take 1 tablet (100 mg total) by mouth daily. 02/25/21   Cameron Sprang, MD  VITAMIN D PO Take 1,000 Units by mouth daily.     [provider]    Physical Exam: Vitals:   04/08/21 2033 04/08/21 2045 04/08/21 2143 04/08/21 2215  BP: (!) 148/71 (!) 143/77 (!) 148/75 125/66  Pulse: 92 82 76 77  Resp:   16 16  Temp: 97.9 F (36.6 C)     TempSrc: Oral     SpO2: 100% 100% 100% 100%    Constitutional: NAD, calm  Eyes: PERTLA, lids and conjunctivae normal ENMT: Mucous membranes are moist. Posterior pharynx clear of any exudate or lesions.   Neck: supple, no masses  Respiratory: clear to auscultation bilaterally, no wheezing, no crackles.   Cardiovascular: S1 & S2 heard, regular rate and rhythm. No extremity edema.   Abdomen: No distension, no tenderness, soft. Bowel sounds active.  Musculoskeletal: no clubbing / cyanosis. No joint deformity upper and lower extremities.   Skin: no significant rashes, lesions, ulcers. Warm,  dry, well-perfused. Neurologic: CN 2-12 grossly intact. Sensation intact. Moving all extremities.  Psychiatric: Alert. Oriented to person only. Restless.     Labs and Imaging on Admission: I have personally reviewed following labs and imaging studies  CBC: Recent Labs  Lab 04/08/21 2142  WBC 8.6  NEUTROABS 7.1  HGB 11.1*  HCT 34.1*  MCV 89.5  PLT [id number]   Basic Metabolic Panel: Recent Labs  Lab 04/08/21 2142  NA 139  K 4.0  CL 103  CO2  27  GLUCOSE 119*  BUN 20  CREATININE 1.06*  CALCIUM 8.9   GFR: CrCl cannot be calculated (Unknown ideal weight.). Liver Function Tests: No results for input(s): AST, ALT, ALKPHOS, BILITOT, PROT, ALBUMIN in the last 168 hours. No results for input(s): LIPASE, AMYLASE in the last 168 hours. No results for input(s): AMMONIA in the last 168 hours. Coagulation Profile: No results for input(s): INR, PROTIME in the last 168 hours. Cardiac Enzymes: No results for input(s): CKTOTAL, CKMB, CKMBINDEX, TROPONINI in the last 168 hours. BNP (last 3 results) No results for input(s): PROBNP in the last 8760 hours. HbA1C: No results for input(s): HGBA1C in the last 72 hours. CBG: No results for input(s): GLUCAP in the last 168 hours. Lipid Profile: No results for input(s): CHOL, HDL, LDLCALC, TRIG, CHOLHDL, LDLDIRECT in the last 72 hours. Thyroid Function Tests: No results for input(s): TSH, T4TOTAL, FREET4, T3FREE, THYROIDAB in the last 72 hours. Anemia Panel: No results for input(s): VITAMINB12, FOLATE, FERRITIN, TIBC, IRON, RETICCTPCT in the last 72 hours. Urine analysis:    Component Value Date/Time   COLORURINE COLORLESS (A) 02/07/2020 1443   APPEARANCEUR CLEAR 02/07/2020 1443   LABSPEC 1.004 (L) 02/07/2020 1443   PHURINE 8.0 02/07/2020 1443   GLUCOSEU NEGATIVE 02/07/2020 1443   GLUCOSEU NEGATIVE 10/06/2016 1610   HGBUR NEGATIVE 02/07/2020 1443   HGBUR negative 09/12/2010 0820   BILIRUBINUR Negative 02/17/2020 [place]  02/07/2020 1443   PROTEINUR Negative 02/17/2020 1014   PROTEINUR NEGATIVE 02/07/2020 1443   UROBILINOGEN 0.2 02/17/2020 1014   UROBILINOGEN 0.2 10/06/2016 1610   NITRITE Negative 02/17/2020 1014   NITRITE NEGATIVE 02/07/2020 1443   LEUKOCYTESUR Negative 02/17/2020 1014   LEUKOCYTESUR MODERATE (A) 02/07/2020 1443   Sepsis Labs: '@LABRCNTIP'$ (procalcitonin:4,lacticidven:4) ) Recent Results (from the past 240 hour(s))  Resp Panel by RT-PCR (Flu A&B, Covid) Nasopharyngeal Swab     Status: None   Collection Time: 04/08/21  9:42 PM   Specimen: Nasopharyngeal Swab; Nasopharyngeal(NP) swabs in vial transport medium  Result Value Ref Range Status   SARS Coronavirus 2 by RT PCR NEGATIVE NEGATIVE Final    Comment: (NOTE) SARS-CoV-2 target nucleic acids are NOT DETECTED.  The SARS-CoV-2 RNA is generally detectable in upper respiratory specimens during the acute phase of infection. The lowest concentration of SARS-CoV-2 viral copies this assay can detect is 138 copies/mL. A negative result does not preclude SARS-Cov-2 infection and should not be used as the sole basis for treatment or other patient management decisions. A negative result may occur with  improper specimen collection/handling, submission of specimen other than nasopharyngeal swab, presence of viral mutation(s) within the areas targeted by this assay, and inadequate number of viral copies(<138 copies/mL). A negative result must be combined with clinical observations, patient history, and epidemiological information. The expected result is Negative.  Fact Sheet for Patients:  EntrepreneurPulse.com.au  Fact Sheet for Healthcare Providers:  IncredibleEmployment.be  This test is no t yet approved or cleared by the [place] FDA and  has been authorized for detection and/or diagnosis of SARS-CoV-2 by FDA under an Emergency Use Authorization (EUA). This EUA will remain  in effect (meaning  this test can be used) for the duration of the COVID-19 declaration under Section 564(b)(1) of the Act, 21 U.S.C.section 360bbb-3(b)(1), unless the authorization is terminated  or revoked sooner.       Influenza A by PCR NEGATIVE NEGATIVE Final   Influenza B by PCR NEGATIVE NEGATIVE Final    Comment: (NOTE) The Xpert  Xpress SARS-CoV-2/FLU/RSV plus assay is intended as an aid in the diagnosis of influenza from Nasopharyngeal swab specimens and should not be used as a sole basis for treatment. Nasal washings and aspirates are unacceptable for Xpert Xpress SARS-CoV-2/FLU/RSV testing.  Fact Sheet for Patients: EntrepreneurPulse.com.au  Fact Sheet for Healthcare Providers: IncredibleEmployment.be  This test is not yet approved or cleared by the [place] FDA and has been authorized for detection and/or diagnosis of SARS-CoV-2 by FDA under an Emergency Use Authorization (EUA). This EUA will remain in effect (meaning this test can be used) for the duration of the COVID-19 declaration under Section 564(b)(1) of the Act, 21 U.S.C. section 360bbb-3(b)(1), unless the authorization is terminated or revoked.  Performed at [facility], [place] [address], [place], [place] [address]      Radiological Exams on Admission: CT HEAD WO CONTRAST (5MM)  Result Date: 04/08/2021 CLINICAL DATA:  Fall, left head strike EXAM: CT HEAD WITHOUT CONTRAST CT CERVICAL SPINE WITHOUT CONTRAST TECHNIQUE: Multidetector CT imaging of the head and cervical spine was performed following the standard protocol without intravenous contrast. Multiplanar CT image reconstructions of the cervical spine were also generated. COMPARISON:  None. FINDINGS: CT HEAD FINDINGS Brain: Multiple sites of hyperdense hemorrhage including: Subdural hemorrhage along the right frontal convexity measuring 4 mm in maximal thickness (6/23). Hyperdense subdural hemorrhage seen along the falx  measuring up to 6 mm in maximal thickness Hyperdense subdural hemorrhage along the tentorium as well. Small intraparenchymal hemorrhage noted in the high left frontal lobe (3/25). Subarachnoid hemorrhage along the sulci of the left frontal lobe along the sylvian fissure (3/15). No significant midline shift or resulting mass effect. No CT evident large vascular territory or cortically based infarct. No focal lesion. Vascular: Atherosclerotic calcification of the carotid siphons. No hyperdense vessel. Skull: Minimal left frontal scalp thickening. No large hematoma. No calvarial fracture. No other acute osseous abnormality. Sinuses/Orbits: Paranasal sinuses and mastoid air cells are predominantly clear. Orbital structures are unremarkable aside from prior lens extractions. Other: None CT CERVICAL SPINE FINDINGS Alignment: Cervical stabilization collar absent the time exam. No evidence of traumatic listhesis. No abnormally widened, perched or jumped facets. Normal alignment of the craniocervical and atlantoaxial articulations. Skull base and vertebrae: Motion degraded imaging may limit detection of subtle abnormalities. Bony fusion across the left C3-4 facets. Multilevel cervical spondylitic changes further detailed below. Additional arthrosis at the atlantodental and basion dens intervals. Soft tissues and spinal canal: No pre or paravertebral fluid or swelling. No visible canal hematoma. 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 Airways patent. No acute traumatic soft tissue abnormality of the neck. No suspicious adenopathy. Disc levels: Multilevel intervertebral disc height loss with spondylitic endplate changes. Ossification of posterior longitudinal ligament C5-C7 resulting in some mild to moderate canal stenosis. Additional disc osteophyte complexes partially effacing the ventral thecal sac without significant canal impingement. Multilevel uncinate spurring facet hypertrophic changes  resulting in some mild to moderate multilevel neural foraminal narrowing most pronounced C4-5 right changes are more moderate to severe. Upper chest: No acute abnormality in the upper chest or imaged lung apices. Other: Normal thyroid. IMPRESSION: Left frontal scalp swelling without calvarial fracture. Sites of intracranial hemorrhage including subdural hemorrhage along the right frontal convexity and in a para falcine and tentorial distribution, left frontal intraparenchymal hemorrhage and left frontal subarachnoid hemorrhage. No significant resulting mass effect or midline shift. Imaging cervical spine imaging degraded by motion artifact. No acute cervical spine fracture or traumatic listhesis. Cervical spondylitic changes, as  detailed above. 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 These results were called by telephone at the time of interpretation on 04/08/2021 at 9:22 pm to provider ADAM CURATOLO , who verbally acknowledged these results. Electronically Signed   By: Lovena Le M.D.   On: 04/08/2021 21:22   CT Cervical Spine Wo Contrast  Result Date: 04/08/2021 CLINICAL DATA:  Fall, left head strike EXAM: CT HEAD WITHOUT CONTRAST CT CERVICAL SPINE WITHOUT CONTRAST TECHNIQUE: Multidetector CT imaging of the head and cervical spine was performed following the standard protocol without intravenous contrast. Multiplanar CT image reconstructions of the cervical spine were also generated. COMPARISON:  None. FINDINGS: CT HEAD FINDINGS Brain: Multiple sites of hyperdense hemorrhage including: Subdural hemorrhage along the right frontal convexity measuring 4 mm in maximal thickness (6/23). Hyperdense subdural hemorrhage seen along the falx measuring up to 6 mm in maximal thickness Hyperdense subdural hemorrhage along the tentorium as well. Small intraparenchymal hemorrhage noted in the high left frontal lobe (3/25). Subarachnoid hemorrhage along the sulci of the left frontal lobe along  the sylvian fissure (3/15). No significant midline shift or resulting mass effect. No CT evident large vascular territory or cortically based infarct. No focal lesion. Vascular: Atherosclerotic calcification of the carotid siphons. No hyperdense vessel. Skull: Minimal left frontal scalp thickening. No large hematoma. No calvarial fracture. No other acute osseous abnormality. Sinuses/Orbits: Paranasal sinuses and mastoid air cells are predominantly clear. Orbital structures are unremarkable aside from prior lens extractions. Other: None CT CERVICAL SPINE FINDINGS Alignment: Cervical stabilization collar absent the time exam. No evidence of traumatic listhesis. No abnormally widened, perched or jumped facets. Normal alignment of the craniocervical and atlantoaxial articulations. Skull base and vertebrae: Motion degraded imaging may limit detection of subtle abnormalities. Bony fusion across the left C3-4 facets. Multilevel cervical spondylitic changes further detailed below. Additional arthrosis at the atlantodental and basion dens intervals. Soft tissues and spinal canal: No pre or paravertebral fluid or swelling. No visible canal hematoma. 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 Airways patent. No acute traumatic soft tissue abnormality of the neck. No suspicious adenopathy. Disc levels: Multilevel intervertebral disc height loss with spondylitic endplate changes. Ossification of posterior longitudinal ligament C5-C7 resulting in some mild to moderate canal stenosis. Additional disc osteophyte complexes partially effacing the ventral thecal sac without significant canal impingement. Multilevel uncinate spurring facet hypertrophic changes resulting in some mild to moderate multilevel neural foraminal narrowing most pronounced C4-5 right changes are more moderate to severe. Upper chest: No acute abnormality in the upper chest or imaged lung apices. Other: Normal thyroid. IMPRESSION: Left  frontal scalp swelling without calvarial fracture. Sites of intracranial hemorrhage including subdural hemorrhage along the right frontal convexity and in a para falcine and tentorial distribution, left frontal intraparenchymal hemorrhage and left frontal subarachnoid hemorrhage. No significant resulting mass effect or midline shift. Imaging cervical spine imaging degraded by motion artifact. No acute cervical spine fracture or traumatic listhesis. Cervical spondylitic changes, as detailed above. 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 These results were called by telephone at the time of interpretation on 04/08/2021 at 9:22 pm to provider ADAM CURATOLO , who verbally acknowledged these results. Electronically Signed   By: Lovena Le M.D.   On: 04/08/2021 21:22     Assessment/Plan   1. Intracranial hemorrhage; mild TBI  - Presents after trip and fall with brief LOC and found to have subdural, intraparenchymal, and subarachnoid hemorrhage without significant mass effect or midline shift   - Neurosurgery  consulted by ED physician and much appreciated - Hold ASA 81, start prophylactic Keppra, supportive care, monitor clinically     2. Dementia with behavior disturbance  - Continue Namenda, Aricept, and Zoloft, use delirium precautions   3. Hypothyroidism  - Continue Synthroid    4. CKD II or IIIa  - SCr is 1.06 (GFR 55), previously 0.8-1.1  - Monitor, renally-dose medications      DVT prophylaxis: SCDs  Code Status: DNR, confirmed with husband  Level of Care: Level of care: Med-Surg Family Communication: Husband updated at bedside  Disposition Plan:  Patient is from: Home  Anticipated d/c is to: Home  Anticipated d/c date is: Possibly as early as 04/10/21  Patient currently: Pending clinical stability  Consults called: Neurosurgery consulted by ED physician  Admission status: Observation     Vianne Bulls, MD [facility]  04/09/2021, 12:18 AM

## 2021-04-08 NOTE — ED Notes (Signed)
Pt was provided perineal care, new brief, sheets and repositioned to comfort by EMT and RN, purewick applied.

## 2021-04-09 ENCOUNTER — Observation Stay (HOSPITAL_COMMUNITY): Payer: Medicare Other

## 2021-04-09 ENCOUNTER — Other Ambulatory Visit: Payer: Self-pay

## 2021-04-09 DIAGNOSIS — S06301A Unspecified focal traumatic brain injury with loss of consciousness of 30 minutes or less, initial encounter: Secondary | ICD-10-CM | POA: Diagnosis not present

## 2021-04-09 DIAGNOSIS — M19012 Primary osteoarthritis, left shoulder: Secondary | ICD-10-CM | POA: Diagnosis not present

## 2021-04-09 DIAGNOSIS — M47814 Spondylosis without myelopathy or radiculopathy, thoracic region: Secondary | ICD-10-CM | POA: Diagnosis not present

## 2021-04-09 DIAGNOSIS — Z043 Encounter for examination and observation following other accident: Secondary | ICD-10-CM | POA: Diagnosis not present

## 2021-04-09 DIAGNOSIS — S06360A Traumatic hemorrhage of cerebrum, unspecified, without loss of consciousness, initial encounter: Secondary | ICD-10-CM | POA: Diagnosis not present

## 2021-04-09 DIAGNOSIS — N182 Chronic kidney disease, stage 2 (mild): Secondary | ICD-10-CM | POA: Insufficient documentation

## 2021-04-09 LAB — BASIC METABOLIC PANEL
Anion gap: 6 (ref 5–15)
BUN: 16 mg/dL (ref 8–23)
CO2: 27 mmol/L (ref 22–32)
Calcium: 8.8 mg/dL — ABNORMAL LOW (ref 8.9–10.3)
Chloride: 110 mmol/L (ref 98–111)
Creatinine, Ser: 0.95 mg/dL (ref 0.44–1.00)
GFR, Estimated: >60 mL/min (ref >60)
Glucose, Bld: 116 mg/dL — ABNORMAL HIGH (ref 70–99)
Potassium: 4.2 mmol/L (ref 3.5–5.1)
Sodium: 143 mmol/L (ref 135–145)

## 2021-04-09 LAB — CBC
HCT: 31.5 % — ABNORMAL LOW (ref 36.0–46.0)
Hemoglobin: 10.3 g/dL — ABNORMAL LOW (ref 12.0–15.0)
MCH: 29.3 pg (ref 26.0–34.0)
MCHC: 32.7 g/dL (ref 30.0–36.0)
MCV: 89.5 fL (ref 80.0–100.0)
Platelets: 210 10*3/uL (ref 150–400)
RBC: 3.52 MIL/uL — ABNORMAL LOW (ref 3.87–5.11)
RDW: 12 % (ref 11.5–15.5)
WBC: 10 10*3/uL (ref 4.0–10.5)
nRBC: 0 % (ref 0.0–0.2)

## 2021-04-09 MED ORDER — LEVOTHYROXINE SODIUM 50 MCG PO TABS
50.0000 ug | ORAL_TABLET | Freq: Every day | ORAL | Status: DC
  Administered 2021-04-09 – 2021-04-12 (×4): 50 ug via ORAL
  Filled 2021-04-09 (×3): qty 1

## 2021-04-09 MED ORDER — MIDODRINE HCL 5 MG PO TABS
5.0000 mg | ORAL_TABLET | Freq: Three times a day (TID) | ORAL | Status: DC
  Administered 2021-04-09 – 2021-04-11 (×7): 5 mg via ORAL
  Filled 2021-04-09 (×13): qty 1

## 2021-04-09 MED ORDER — GABAPENTIN 300 MG PO CAPS
300.0000 mg | ORAL_CAPSULE | Freq: Two times a day (BID) | ORAL | Status: DC
  Administered 2021-04-09 – 2021-04-12 (×7): 300 mg via ORAL
  Filled 2021-04-09 (×7): qty 1

## 2021-04-09 MED ORDER — LEVETIRACETAM IN NACL 1000 MG/100ML IV SOLN
1000.0000 mg | Freq: Two times a day (BID) | INTRAVENOUS | Status: DC
  Administered 2021-04-09 – 2021-04-10 (×4): 1000 mg via INTRAVENOUS
  Filled 2021-04-09 (×4): qty 100

## 2021-04-09 MED ORDER — MEMANTINE HCL 10 MG PO TABS
10.0000 mg | ORAL_TABLET | Freq: Two times a day (BID) | ORAL | Status: DC
  Administered 2021-04-09 – 2021-04-12 (×7): 10 mg via ORAL
  Filled 2021-04-09 (×8): qty 1

## 2021-04-09 MED ORDER — MORPHINE SULFATE (PF) 2 MG/ML IV SOLN: 2.0000 mg | INTRAVENOUS | Status: DC | PRN

## 2021-04-09 MED ORDER — DONEPEZIL HCL 10 MG PO TABS
10.0000 mg | ORAL_TABLET | Freq: Every day | ORAL | Status: DC
  Administered 2021-04-10 – 2021-04-11 (×2): 10 mg via ORAL
  Filled 2021-04-09 (×2): qty 1

## 2021-04-09 MED ORDER — HALOPERIDOL LACTATE 5 MG/ML IJ SOLN
2.0000 mg | Freq: Four times a day (QID) | INTRAMUSCULAR | Status: DC | PRN
  Administered 2021-04-09: 2 mg via INTRAVENOUS
  Filled 2021-04-09: qty 1

## 2021-04-09 MED ORDER — SERTRALINE HCL 100 MG PO TABS
100.0000 mg | ORAL_TABLET | Freq: Every day | ORAL | Status: DC
  Administered 2021-04-09 – 2021-04-12 (×4): 100 mg via ORAL
  Filled 2021-04-09 (×4): qty 1

## 2021-04-09 NOTE — Evaluation (Signed)
Occupational Therapy Evaluation Patient Details Name: Michelle [name] MRN: [id number] DOB: [birth year] Today's Date: 04/09/2021    History of Present Illness Patient is a 75 year old female presenting to the emergency department after falling and hitting her head. 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 No mass effect, all lesions non operative. PMH includes dementia with behavioral disturbance, orthostatic hypotension, seasonal allergy, and hypothyroidism   Clinical Impression   Patient lives with spouse in a single level home with 3 sets of steps to enter (in total 5 steps) with B rails. At baseline spouse reports she is mostly independent "I'll help her if in a hurry" with bathing, lower body dressing. Does not use an adaptive device for ambulation. Currently patient needing mod to max cues to initiate tasks with spouse assisting to motivate. Patient min A x2 to stand and hand held assist x1-2 to ambulate to/from bathroom due to decreased balance. Patient total A in standing for safety for peri care due to incontinent of urine/soiled brief. Spouse reporting patient is below her baseline with mobility "but they did sedate her last night to sleep." If patient improve with mobility spouse agreeable to home D/C plan, is interested in HHPT which she has had in the past "it was great." Acute OT to follow to progress with D/C plan home.     Follow Up Recommendations  Supervision/Assistance - 24 hour;Other (comment) (spouse interested in [place])    Equipment Recommendations  Tub/shower seat       Precautions / Restrictions Precautions Precautions: Fall Precaution Comments: fall ~41yrago Restrictions Weight Bearing Restrictions: No      Mobility Bed Mobility Overal bed mobility: Needs Assistance Bed Mobility: Supine to Sit;Sit to Supine     Supine to sit: Mod assist;HOB elevated;+2 for physical assistance Sit to supine: Min  assist   General bed mobility comments: decreased initiation therefore spouse assisting with legs out of bed while OT support trunk. patient does initiate laying back into bed with tactile cues, min A to lift legs onto bed    Transfers Overall transfer level: Needs assistance Equipment used: 2 person hand held assist Transfers: Sit to/from Stand Sit to Stand: Min assist;+2 physical assistance;+2 safety/equipment         General transfer comment: please see toilet transfer in ADL section    Balance Overall balance assessment: History of Falls;Needs assistance Sitting-balance support: Feet supported Sitting balance-Leahy Scale: Fair Sitting balance - Comments: close S for safety   Standing balance support: Single extremity supported;Bilateral upper extremity supported Standing balance-Leahy Scale: Poor Standing balance comment: reliant on external assist                           ADL either performed or assessed with clinical judgement   ADL Overall ADL's : Needs assistance/impaired     Grooming: Supervision/safety;Set up;Sitting   Upper Body Bathing: Minimal assistance;Sitting   Lower Body Bathing: Moderate assistance;Sit to/from stand;Sitting/lateral leans   Upper Body Dressing : Minimal assistance;Sitting   Lower Body Dressing: Total assistance;Sitting/lateral leans Lower Body Dressing Details (indicate cue type and reason): to don socks, spouse reports he helps her at baseline "sometimes I'm short on patience" Toilet Transfer: Minimal assistance;+2 for physical assistance;+2 for safety/equipment;Cueing for safety;Cueing for sequencing;Regular Toilet;Ambulation Toilet Transfer Details (indicate cue type and reason): hand held assist x2 as patient is more unsteady compared to her baseline per spouse. needs mod  to max cues to initiate sitting onto toilet Toileting- Clothing Manipulation and Hygiene: Total assistance;Sit to/from stand Toileting - Clothing  Manipulation Details (indicate cue type and reason): for safety/balance after incontinent of urine     Functional mobility during ADLs: Minimal assistance;+2 for physical assistance;+2 for safety/equipment;Cueing for sequencing;Cueing for safety General ADL Comments: spouse reports patient is a little below her baseline with mobility, does not normally have to provide hand held assistance      Pertinent Vitals/Pain Pain Assessment: Faces Faces Pain Scale: No hurt     Hand Dominance  (did not specify')   Extremity/Trunk Assessment Upper Extremity Assessment Upper Extremity Assessment: Overall WFL for tasks assessed   Lower Extremity Assessment Lower Extremity Assessment: Defer to PT evaluation       Communication Communication Communication: HOH   Cognition Arousal/Alertness: Awake/alert Behavior During Therapy: Flat affect Overall Cognitive Status: History of cognitive impairments - at baseline                                 General Comments: spouse reports patient appears a little off from baseline, likely to being sedated last night to help her sleep              Home Living Family/patient expects to be discharged to:: Private residence Living Arrangements: Spouse/significant other Available Help at Discharge: Family Type of Home: House Home Access: Stairs to enter Technical brewer of Steps: 5 Entrance Stairs-Rails: Right;Left Home Layout: One level     Bathroom Shower/Tub: Occupational psychologist: Handicapped height     Home Equipment: Environmental consultant - 2 wheels          Prior Functioning/Environment Level of Independence: Needs assistance  Gait / Transfers Assistance Needed: ambulates without assistive device ADL's / Homemaking Assistance Needed: spouse assists with bathing/ getting undressed to bath. otherwise she can perform ADL tasks.            OT Problem List: Decreased activity tolerance;Impaired balance (sitting  and/or standing)      OT Treatment/Interventions: Self-care/ADL training;Balance training;Patient/family education;Therapeutic activities    OT Goals(Current goals can be found in the care plan section) Acute Rehab OT Goals Patient Stated Goal: "get her more mobile" OT Goal Formulation: With family Time For Goal Achievement: 04/23/21 Potential to Achieve Goals: Good  OT Frequency: Min 2X/week    AM-PAC OT "6 Clicks" Daily Activity     Outcome Measure Help from another person eating meals?: A Little Help from another person taking care of personal grooming?: A Little Help from another person toileting, which includes using toliet, bedpan, or urinal?: A Lot Help from another person bathing (including washing, rinsing, drying)?: A Lot Help from another person to put on and taking off regular upper body clothing?: A Little Help from another person to put on and taking off regular lower body clothing?: Total 6 Click Score: 14   End of Session Nurse Communication: Mobility status  Activity Tolerance: Patient tolerated treatment well Patient left: in bed;with call bell/phone within reach;with bed alarm set;with family/visitor present  OT Visit Diagnosis: Unsteadiness on feet (R26.81);Other abnormalities of gait and mobility (R26.89);History of falling (Z91.81)                Time: [id number] OT Time Calculation (min): 31 min Charges:  OT General Charges $OT Visit: 1 Visit OT Evaluation $OT Eval Low Complexity: 1 Low OT Treatments $Self Care/Home Management : 8-22 mins  Delbert Phenix OT OT pager: [phone]  Rosemary Holms 04/09/2021, 2:10 PM

## 2021-04-09 NOTE — ED Notes (Signed)
Pt was provided perineal care, new linens, brief was applied. Pt removed purewick herself. Pt repositioned to comfort.

## 2021-04-09 NOTE — Progress Notes (Signed)
PT Cancellation Note  Patient Details Name: Michelle [name] MRN: [id number] DOB: 1945-10-25   Cancelled Treatment:    Reason Eval/Treat Not Completed: Fatigue/lethargy limiting ability to participate (Attempted 2x; pt sleping and not easily awoken. family present on second attempt. Will follow up at later date/time when pt is more alert and as shcedule allows.)  Verner Mould, DPT Acute Rehabilitation Services Office [phone] Pager [phone]

## 2021-04-09 NOTE — Plan of Care (Signed)

## 2021-04-09 NOTE — Progress Notes (Signed)
Patient ID: Michelle [name], female   DOB: [birth year], 75 y.o.   MRN: [id number] BP 125/66   Pulse 77   Temp 97.9 F (36.6 C) (Oral)   Resp 16   SpO2 100%  Head CT reviewed. 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  No mass effect, all lesions non operative. No need for repeat scan unless neurological exam changes.

## 2021-04-09 NOTE — Progress Notes (Signed)
PROGRESS NOTE    Michelle [name]  [id number] DOB: Feb 10, 1946 DOA: 04/08/2021 PCP: Ann Held, DO   Chief Complaint  Patient presents with   Fall    Brief Narrative:  Michelle [name] is Michelle [name] 75 y.o. female with medical history significant for dementia with behavioral disturbance, orthostatic hypotension, seasonal allergy, and hypothyroidism, now presenting to the emergency department after falling and hitting her head.  She is accompanied by her husband who provides much of the history.  Patient had been in her usual state of health when she fell tonight, appeared to hit her forehead on the ground, and was briefly unconscious.  She began to mumble in response to questions from family and gradually became more alert over the next 20 minutes.  She was complaining of Janiesha Diehl headache after this.  She has baseline dementia, is able to dress herself, but requires assistance from her husband with bathing and meals.  She is oriented to person only at baseline.  She has chronic headaches and was started on gabapentin for prophylaxis.   ED Course: Upon arrival to the ED, patient is found to be afebrile, saturating well on room air, and with stable blood pressure.  Chemistry panel with mild renal insufficiency and CBC with hemoglobin 11.1.  CT cervical spine negative for acute fracture or traumatic listhesis.  Head CT with subdural hemorrhage along the right frontal convexity, along the falx, and along the tentorium.  Also noted on head CT small intraparenchymal hemorrhage in the high left frontal lobe and subarachnoid hemorrhage along the sulci of the left frontal lobe and sylvian fissure.  There is no significant midline shift or mass-effect on CT.  Neurosurgery was consulted by the ED physician and the patient was treated with Tylenol.  Assessment & Plan:   Principal Problem:   Intracranial hemorrhage following injury ([place]) Active Problems:   Hypothyroidism   Late onset Alzheimer's dementia with  behavioral disturbance (HCC)   Orthostatic hypotension  1. Intracranial hemorrhage; mild TBI   Mechanical Fall - Presents after trip and fall with brief LOC  - CT head/neck with intracranial hemorage (subdural along R frontal convexity and in para falcine and tenorial distribution, L frontal intraparenchymal hemorrhage and L frontal subarachnoid hemorrhage).  No significant resulting mass effect or midline shift.  C spine imaging degraded by motion, no acute C spine fx or traumatic listhesis. Cervical spondylitic changes. - nasal/facial swelling, follow maxillofacial CT scan today - L rib pain, plain films without fx - PT eval - Neurosurgery consulted by ED physician and much appreciated - noted no need for repeat scan unless neurologic changes.  Discussed today with Dr. Cyndy Freeze, notes ok for d/c when stable.  Will d/c keppra as no recommendation for this.     - Hold ASA 81 (will discontinue this as it was for primary prevention), will stop prophylactic Keppra after discussion with nsgy   2. Dementia with behavior disturbance  - Continue Namenda, Aricept, and Zoloft, use delirium precautions    3. Hypothyroidism  - Continue Synthroid     # Hypotension Midodrine   4. CKD II or IIIa  - SCr is 1.06 (GFR 55), previously 0.8-1.1 - Monitor, renally-dose medications     Aspirin for primary prevention - will d/c in setting of fall and intracranial hemorrhage   DVT prophylaxis: (SCD Code Status: DNR Family Communication: husband at bedside Disposition:   Status is: Observation  The patient remains OBS appropriate and will d/c before 2 midnights.  Dispo:  The patient is from: Home              Anticipated d/c is to: Home              Patient currently is not medically stable to d/c.   Difficult to place patient No       Consultants:  neurosurgery  Procedures: none  Antimicrobials:  Anti-infectives (From admission, onward)    None        Subjective: No new  complaints  Objective: Vitals:   04/09/21 0715 04/09/21 0818 04/09/21 1210 04/09/21 1637  BP: (!) 112/56 (!) 107/48 118/61 (!) 128/55  Pulse: 70 64 67 62  Resp: '16 17 17 16  '$ Temp: 98.2 F (36.8 C) 98.4 F (36.9 C) 98.1 F (36.7 C) 98.7 F (37.1 C)  TempSrc:    Axillary  SpO2: 100% 99%  100%    Intake/Output Summary (Last 24 hours) at 04/09/2021 1829 Last data filed at 04/09/2021 1050 Gross per 24 hour  Intake 304.81 ml  Output --  Net 304.81 ml   There were no vitals filed for this visit.  Examination:  General exam: Appears calm and comfortable  Respiratory system: unlabored Cardiovascular system: RRR Gastrointestinal system: Abdomen is nondistended, soft and nontender Central nervous system: disoriented, hard of hearing.moving all extremities Extremities: no LEE  Msk - no clavicular crepitus, no TTP to bilateral arms, stable hip, no TTP to bilateral legs Skin: No rashes, lesions or ulcers Psychiatry: Judgement and insight appear normal. Mood & affect appropriate.     Data Reviewed: I have personally reviewed following labs and imaging studies  CBC: Recent Labs  Lab 04/08/21 2142 04/09/21 0500  WBC 8.6 10.0  NEUTROABS 7.1  --   HGB 11.1* 10.3*  HCT 34.1* 31.5*  MCV 89.5 89.5  PLT 241 [id number]    Basic Metabolic Panel: Recent Labs  Lab 04/08/21 2142 04/09/21 0500  NA 139 143  K 4.0 4.2  CL 103 110  CO2 27 27  GLUCOSE 119* 116*  BUN 20 16  CREATININE 1.06* 0.95  CALCIUM 8.9 8.8*    GFR: CrCl cannot be calculated (Unknown ideal weight.).  Liver Function Tests: No results for input(s): AST, ALT, ALKPHOS, BILITOT, PROT, ALBUMIN in the last 168 hours.  CBG: No results for input(s): GLUCAP in the last 168 hours.   Recent Results (from the past 240 hour(s))  Resp Panel by RT-PCR (Flu Dazani Norby&B, Covid) Nasopharyngeal Swab     Status: None   Collection Time: 04/08/21  9:42 PM   Specimen: Nasopharyngeal Swab; Nasopharyngeal(NP) swabs in vial transport medium   Result Value Ref Range Status   SARS Coronavirus 2 by RT PCR NEGATIVE NEGATIVE Final    Comment: (NOTE) SARS-CoV-2 target nucleic acids are NOT DETECTED.  The SARS-CoV-2 RNA is generally detectable in upper respiratory specimens during the acute phase of infection. The lowest concentration of SARS-CoV-2 viral copies this assay can detect is 138 copies/mL. Deidra Spease negative result does not preclude SARS-Cov-2 infection and should not be used as the sole basis for treatment or other patient management decisions. Avriana Joo negative result may occur with  improper specimen collection/handling, submission of specimen other than nasopharyngeal swab, presence of viral mutation(s) within the areas targeted by this assay, and inadequate number of viral copies(<138 copies/mL). Ailanie Ruttan negative result must be combined with clinical observations, patient history, and epidemiological information. The expected result is Negative.  Fact Sheet for Patients:  EntrepreneurPulse.com.au  Fact Sheet for Healthcare Providers:  IncredibleEmployment.be  This test is no t yet approved or cleared by the [place] and  has been authorized for detection and/or diagnosis of SARS-CoV-2 by FDA under an Emergency Use Authorization (EUA). This EUA will remain  in effect (meaning this test can be used) for the duration of the COVID-19 declaration under Section 564(b)(1) of the Act, 21 U.S.C.section 360bbb-3(b)(1), unless the authorization is terminated  or revoked sooner.       Influenza Laquita Harlan by PCR NEGATIVE NEGATIVE Final   Influenza B by PCR NEGATIVE NEGATIVE Final    Comment: (NOTE) The Xpert Xpress SARS-CoV-2/FLU/RSV plus assay is intended as an aid in the diagnosis of influenza from Nasopharyngeal swab specimens and should not be used as Vash Quezada sole basis for treatment. Nasal washings and aspirates are unacceptable for Xpert Xpress SARS-CoV-2/FLU/RSV testing.  Fact Sheet for  Patients: EntrepreneurPulse.com.au  Fact Sheet for Healthcare Providers: IncredibleEmployment.be  This test is not yet approved or cleared by the [place] FDA and has been authorized for detection and/or diagnosis of SARS-CoV-2 by FDA under an Emergency Use Authorization (EUA). This EUA will remain in effect (meaning this test can be used) for the duration of the COVID-19 declaration under Section 564(b)(1) of the Act, 21 U.S.C. section 360bbb-3(b)(1), unless the authorization is terminated or revoked.  Performed at [facility], [place] [address] [place], [place] [address]          Radiology Studies: DG Ribs Unilateral W/Chest Left  Result Date: 04/09/2021 CLINICAL DATA:  Fall EXAM: LEFT RIBS AND CHEST - 3+ VIEW COMPARISON:  Chest radiograph 02/07/2020 FINDINGS: There is no evidence of displaced rib fracture. Unchanged cardiac silhouette. No focal airspace disease. No pleural effusion or pneumothorax. Bilateral shoulder degenerative changes. Thoracic spondylosis. IMPRESSION: No evidence of displaced rib fracture. Electronically Signed   By: Maurine Simmering   On: 04/09/2021 17:38   CT HEAD WO CONTRAST (5MM)  Result Date: 04/08/2021 CLINICAL DATA:  Fall, left head strike EXAM: CT HEAD WITHOUT CONTRAST CT CERVICAL SPINE WITHOUT CONTRAST TECHNIQUE: Multidetector CT imaging of the head and cervical spine was performed following the standard protocol without intravenous contrast. Multiplanar CT image reconstructions of the cervical spine were also generated. COMPARISON:  None. FINDINGS: CT HEAD FINDINGS Brain: Multiple sites of hyperdense hemorrhage including: Subdural hemorrhage along the right frontal convexity measuring 4 mm in maximal thickness (6/23). Hyperdense subdural hemorrhage seen along the falx measuring up to 6 mm in maximal thickness Hyperdense subdural hemorrhage along the tentorium as well. Small intraparenchymal hemorrhage  noted in the high left frontal lobe (3/25). Subarachnoid hemorrhage along the sulci of the left frontal lobe along the sylvian fissure (3/15). No significant midline shift or resulting mass effect. No CT evident large vascular territory or cortically based infarct. No focal lesion. Vascular: Atherosclerotic calcification of the carotid siphons. No hyperdense vessel. Skull: Minimal left frontal scalp thickening. No large hematoma. No calvarial fracture. No other acute osseous abnormality. Sinuses/Orbits: Paranasal sinuses and mastoid air cells are predominantly clear. Orbital structures are unremarkable aside from prior lens extractions. Other: None CT CERVICAL SPINE FINDINGS Alignment: Cervical stabilization collar absent the time exam. No evidence of traumatic listhesis. No abnormally widened, perched or jumped facets. Normal alignment of the craniocervical and atlantoaxial articulations. Skull base and vertebrae: Motion degraded imaging may limit detection of subtle abnormalities. Bony fusion across the left C3-4 facets. Multilevel cervical spondylitic changes further detailed below. Additional arthrosis at the atlantodental and basion dens intervals. Soft tissues and spinal canal: No pre  or paravertebral fluid or swelling. No visible canal hematoma. 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 Airways patent. No acute traumatic soft tissue abnormality of the neck. No suspicious adenopathy. Disc levels: Multilevel intervertebral disc height loss with spondylitic endplate changes. Ossification of posterior longitudinal ligament C5-C7 resulting in some mild to moderate canal stenosis. Additional disc osteophyte complexes partially effacing the ventral thecal sac without significant canal impingement. Multilevel uncinate spurring facet hypertrophic changes resulting in some mild to moderate multilevel neural foraminal narrowing most pronounced C4-5 right changes are more moderate to severe.  Upper chest: No acute abnormality in the upper chest or imaged lung apices. Other: Normal thyroid. IMPRESSION: Left frontal scalp swelling without calvarial fracture. Sites of intracranial hemorrhage including subdural hemorrhage along the right frontal convexity and in Airiel Oblinger para falcine and tentorial distribution, left frontal intraparenchymal hemorrhage and left frontal subarachnoid hemorrhage. No significant resulting mass effect or midline shift. Imaging cervical spine imaging degraded by motion artifact. No acute cervical spine fracture or traumatic listhesis. Cervical spondylitic changes, as detailed above. 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 These results were called by telephone at the time of interpretation on 04/08/2021 at 9:22 pm to provider ADAM CURATOLO , who verbally acknowledged these results. Electronically Signed   By: Lovena Le M.D.   On: 04/08/2021 21:22   CT Cervical Spine Wo Contrast  Result Date: 04/08/2021 CLINICAL DATA:  Fall, left head strike EXAM: CT HEAD WITHOUT CONTRAST CT CERVICAL SPINE WITHOUT CONTRAST TECHNIQUE: Multidetector CT imaging of the head and cervical spine was performed following the standard protocol without intravenous contrast. Multiplanar CT image reconstructions of the cervical spine were also generated. COMPARISON:  None. FINDINGS: CT HEAD FINDINGS Brain: Multiple sites of hyperdense hemorrhage including: Subdural hemorrhage along the right frontal convexity measuring 4 mm in maximal thickness (6/23). Hyperdense subdural hemorrhage seen along the falx measuring up to 6 mm in maximal thickness Hyperdense subdural hemorrhage along the tentorium as well. Small intraparenchymal hemorrhage noted in the high left frontal lobe (3/25). Subarachnoid hemorrhage along the sulci of the left frontal lobe along the sylvian fissure (3/15). No significant midline shift or resulting mass effect. No CT evident large vascular territory or cortically  based infarct. No focal lesion. Vascular: Atherosclerotic calcification of the carotid siphons. No hyperdense vessel. Skull: Minimal left frontal scalp thickening. No large hematoma. No calvarial fracture. No other acute osseous abnormality. Sinuses/Orbits: Paranasal sinuses and mastoid air cells are predominantly clear. Orbital structures are unremarkable aside from prior lens extractions. Other: None CT CERVICAL SPINE FINDINGS Alignment: Cervical stabilization collar absent the time exam. No evidence of traumatic listhesis. No abnormally widened, perched or jumped facets. Normal alignment of the craniocervical and atlantoaxial articulations. Skull base and vertebrae: Motion degraded imaging may limit detection of subtle abnormalities. Bony fusion across the left C3-4 facets. Multilevel cervical spondylitic changes further detailed below. Additional arthrosis at the atlantodental and basion dens intervals. Soft tissues and spinal canal: No pre or paravertebral fluid or swelling. No visible canal hematoma. 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 Airways patent. No acute traumatic soft tissue abnormality of the neck. No suspicious adenopathy. Disc levels: Multilevel intervertebral disc height loss with spondylitic endplate changes. Ossification of posterior longitudinal ligament C5-C7 resulting in some mild to moderate canal stenosis. Additional disc osteophyte complexes partially effacing the ventral thecal sac without significant canal impingement. Multilevel uncinate spurring facet hypertrophic changes resulting in some mild to moderate multilevel neural foraminal narrowing most pronounced C4-5 right changes are  more moderate to severe. Upper chest: No acute abnormality in the upper chest or imaged lung apices. Other: Normal thyroid. IMPRESSION: Left frontal scalp swelling without calvarial fracture. Sites of intracranial hemorrhage including subdural hemorrhage along the right  frontal convexity and in Josselyne Onofrio para falcine and tentorial distribution, left frontal intraparenchymal hemorrhage and left frontal subarachnoid hemorrhage. No significant resulting mass effect or midline shift. Imaging cervical spine imaging degraded by motion artifact. No acute cervical spine fracture or traumatic listhesis. Cervical spondylitic changes, as detailed above. 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 These results were called by telephone at the time of interpretation on 04/08/2021 at 9:22 pm to provider ADAM CURATOLO , who verbally acknowledged these results. Electronically Signed   By: Lovena Le M.D.   On: 04/08/2021 21:22        Scheduled Meds:  [START ON 04/10/2021] donepezil  10 mg Oral QHS   gabapentin  300 mg Oral BID   levothyroxine  50 mcg Oral Q0600   memantine  10 mg Oral BID   midodrine  5 mg Oral TID WC   sertraline  100 mg Oral Daily   Continuous Infusions:  levETIRAcetam 1,000 mg (04/09/21 1110)     LOS: 0 days    Time spent: over 30 min    Fayrene Helper, MD [facility]   To contact the attending provider between 7A-7P or the covering provider during after hours 7P-7A, please log into the web site [email or web address] and access using universal [place] password for that web site. If you do not have the password, please call the hospital operator.  04/09/2021, 6:29 PM

## 2021-04-10 ENCOUNTER — Observation Stay (HOSPITAL_COMMUNITY): Payer: Medicare Other

## 2021-04-10 DIAGNOSIS — E039 Hypothyroidism, unspecified: Secondary | ICD-10-CM | POA: Diagnosis not present

## 2021-04-10 DIAGNOSIS — G301 Alzheimer's disease with late onset: Secondary | ICD-10-CM | POA: Diagnosis not present

## 2021-04-10 DIAGNOSIS — R410 Disorientation, unspecified: Secondary | ICD-10-CM | POA: Diagnosis not present

## 2021-04-10 DIAGNOSIS — G319 Degenerative disease of nervous system, unspecified: Secondary | ICD-10-CM | POA: Diagnosis not present

## 2021-04-10 DIAGNOSIS — S06301A Unspecified focal traumatic brain injury with loss of consciousness of 30 minutes or less, initial encounter: Secondary | ICD-10-CM | POA: Diagnosis not present

## 2021-04-10 DIAGNOSIS — F0281 Dementia in other diseases classified elsewhere with behavioral disturbance: Secondary | ICD-10-CM | POA: Diagnosis not present

## 2021-04-10 LAB — URINALYSIS, ROUTINE W REFLEX MICROSCOPIC
Bilirubin Urine: NEGATIVE
Glucose, UA: NEGATIVE mg/dL
Ketones, ur: 5 mg/dL — AB
Nitrite: NEGATIVE
Protein, ur: NEGATIVE mg/dL
Specific Gravity, Urine: 1.011 (ref 1.005–1.030)
WBC, UA: >50 WBC/hpf — ABNORMAL HIGH (ref 0–5)
pH: 6 (ref 5.0–8.0)

## 2021-04-10 MED ORDER — SODIUM CHLORIDE 0.9 % IV SOLN: INTRAVENOUS | Status: DC

## 2021-04-10 MED ORDER — SODIUM CHLORIDE 0.9 % IV SOLN
1.0000 g | INTRAVENOUS | Status: DC
  Administered 2021-04-10 – 2021-04-11 (×2): 1 g via INTRAVENOUS
  Filled 2021-04-10 (×2): qty 10

## 2021-04-10 NOTE — Progress Notes (Signed)
Rehab Admissions Coordinator Note:  Patient was screened by Cleatrice Burke for appropriateness for an Inpatient Acute Rehab Consult per therapy recs.  At this time, we are recommending Inpatient Rehab consult. I will place order per protocol.  Cleatrice Burke RN MSN 04/10/2021, 4:02 PM  I can be reached at [phone].

## 2021-04-10 NOTE — Evaluation (Signed)
Physical Therapy Evaluation Patient Details Name: Michelle [name] MRN: [id number] DOB: [birth year] Today's Date: 04/10/2021   History of Present Illness  Patient is a 75 year old female presenting to the emergency department after falling and hitting her head on 04/08/21. 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 No mass effect, all lesions non operative. PMH includes dementia with behavioral disturbance, orthostatic hypotension, seasonal allergy, and hypothyroidism  Clinical Impression  Pt admitted with above diagnosis. At baseline, pt resides with spouse and can ambulate without AD for short community distances.  She does have dementia but performs ADLs except has supervision with bathing.  At evaluation, pt is resting comfortably in bed and does not awake to verbal cues.  She does become more alert when sat on EOB.  Pt is able to participate with transfers and gait with gestures and assist to initiate.  Does require min-mod A of 2 for safety with transfers/ gait.  Pt has excellent 24 hr support but do recommend post-acute rehab due to level of assist required at this time and pt is well below her baseline.  Pt currently with functional limitations due to the deficits listed below (see PT Problem List). Pt will benefit from skilled PT to increase their independence and safety with mobility to allow discharge to the venue listed below.       Follow Up Recommendations CIR    Equipment Recommendations  Wheelchair cushion (measurements PT);Wheelchair (measurements PT);3in1 (PT)    Recommendations for Other Services Rehab consult     Precautions / Restrictions Precautions Precautions: Fall Precaution Comments: HOH Restrictions Weight Bearing Restrictions: No      Mobility  Bed Mobility Overal bed mobility: Needs Assistance Bed Mobility: Supine to Sit;Sit to Supine     Supine to sit: Mod assist;+2 for physical  assistance Sit to supine: Min assist;+2 for physical assistance   General bed mobility comments: Requiring mod A to initiate and complete sitting due to lethargy  but once at EOB more alert.    Transfers Overall transfer level: Needs assistance Equipment used: 2 person hand held assist Transfers: Sit to/from Stand Sit to Stand: Min assist;+2 physical assistance;+2 safety/equipment;Mod assist         General transfer comment: Performed sit to stand x2 with mod A x 2 first attempt and min A of 2 second.  Not able to follow commands with RW.  Assist to initiate  Ambulation/Gait Ambulation/Gait assistance: Mod assist;+2 safety/equipment;+2 physical assistance Gait Distance (Feet): 50 Feet Assistive device: 2 person hand held assist Gait Pattern/deviations: Step-to pattern;Decreased stride length;Shuffle;Decreased dorsiflexion - right;Decreased dorsiflexion - left Gait velocity: decreased   General Gait Details: Pt with shuffle gait and requiring HHA to steady and lead direction.  She did turn with cues.  Very short shuffle steps - cues and assist to weight shift to increase stride length - had 1 episode that was able to improve step length but mostly shuffled  Stairs            Wheelchair Mobility    Modified Rankin (Stroke Patients Only)       Balance Overall balance assessment: History of Falls;Needs assistance Sitting-balance support: No upper extremity supported Sitting balance-Leahy Scale: Poor Sitting balance - Comments: Sat EOB for several mins before and after ambulation.  Before ambulation requiring min-mod A for balance with posterior lean.  After ambulation - sitting EOB with legs crossed, hands in lap, with close supervision.   Standing  balance support: Bilateral upper extremity supported Standing balance-Leahy Scale: Poor Standing balance comment: Tendency to posterior lean initially but improving with gait.  Does need min A of 2 for safety and HHA                              Pertinent Vitals/Pain Pain Assessment: No/denies pain Faces Pain Scale: No hurt    Home Living Family/patient expects to be discharged to:: Private residence Living Arrangements: Spouse/significant other Available Help at Discharge: Family;Available 24 hours/day Type of Home: House Home Access: Stairs to enter Entrance Stairs-Rails: Psychiatric nurse of Steps: 5 Home Layout: One level Home Equipment: Walker - 2 wheels      Prior Function Level of Independence: Needs assistance   Gait / Transfers Assistance Needed: Spouse reports pt ambulated without AD and could ambulate in house and short community distances.  ADL's / Homemaking Assistance Needed: spouse assists with bathing/ getting undressed to bath. otherwise she can perform ADL tasks.        Hand Dominance        Extremity/Trunk Assessment   Upper Extremity Assessment Upper Extremity Assessment: Defer to OT evaluation    Lower Extremity Assessment Lower Extremity Assessment: Difficult to assess due to impaired cognition (Pt not following MMT commands.  Does demonstrate ROM WFL and at least 4/5 strength throughout (keeping legs extended in sitting and therapist providing resistance to get feet on floor, not able to formally MMT))    Cervical / Trunk Assessment Cervical / Trunk Assessment: Normal  Communication   Communication: HOH  Cognition Arousal/Alertness: Lethargic (increased arousal and keeping eyes open with mobiltiy) Behavior During Therapy: Flat affect Overall Cognitive Status: Difficult to assess                                 General Comments: Pt has dementia at baseline.  However, currently with limited communication and lethargy -making difficult to fully assess.  She did follow commands for basic mobility when providing gestures, tactile cues, and initiation.      General Comments General comments (skin integrity, edema, etc.): Spoke  with pt's family in regards to pt's current level of function and therapy recommendations for further post-acute rehab.  Spouse expressing that ideally he would like to take her home, but also doesn't want to get her home and not be able to manage.  Family seemed willing to consider rehab.    Exercises     Assessment/Plan    PT Assessment Patient needs continued PT services  PT Problem List Decreased strength;Decreased mobility;Decreased safety awareness;Decreased range of motion;Decreased activity tolerance;Decreased cognition;Decreased balance;Decreased knowledge of use of DME;Decreased coordination       PT Treatment Interventions DME instruction;Therapeutic activities;Cognitive remediation;Gait training;Therapeutic exercise;Patient/family education;Balance training;Functional mobility training;Neuromuscular re-education;Wheelchair mobility training    PT Goals (Current goals can be found in the Care Plan section)  Acute Rehab PT Goals Patient Stated Goal: "get her more mobile" PT Goal Formulation: With patient/family Time For Goal Achievement: 04/24/21 Potential to Achieve Goals: Fair    Frequency Min 4X/week   Barriers to discharge        Co-evaluation               AM-PAC PT "6 Clicks" Mobility  Outcome Measure Help needed turning from your back to your side while in a flat bed without using bedrails?: A Lot Help needed  moving from lying on your back to sitting on the side of a flat bed without using bedrails?: A Lot Help needed moving to and from a bed to a chair (including a wheelchair)?: A Lot Help needed standing up from a chair using your arms (e.g., wheelchair or bedside chair)?: A Lot Help needed to walk in hospital room?: A Lot Help needed climbing 3-5 steps with a railing? : Total 6 Click Score: 11    End of Session Equipment Utilized During Treatment: Gait belt Activity Tolerance: Patient tolerated treatment well Patient left: in bed;with call  bell/phone within reach;with bed alarm set;with SCD's reapplied Nurse Communication: Mobility status PT Visit Diagnosis: Unsteadiness on feet (R26.81);Other abnormalities of gait and mobility (R26.89)    Time: [id number] PT Time Calculation (min) (ACUTE ONLY): 27 min   Charges:   PT Evaluation $PT Eval Moderate Complexity: 1 Mod PT Treatments $Gait Training: 8-22 mins        Abran Richard, PT Acute Rehab Services Pager [phone] [facility] [place] 04/10/2021, 3:13 PM

## 2021-04-10 NOTE — Progress Notes (Signed)
Occupational Therapy Treatment Patient Details Name: Michelle [name] MRN: [id number] DOB: August 30, 1946 Today's Date: 04/10/2021    History of present illness Patient is a 75 year old female presenting to the emergency department after falling and hitting her head. 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 No mass effect, all lesions non operative. PMH includes dementia with behavioral disturbance, orthostatic hypotension, seasonal allergy, and hypothyroidism   OT comments  Unfortunately patient more somnolent today, needing max cues and stimuli however did moderately improve once sitting upright at edge of bed and mobile with OT. Patient needing max A x2 for bed mobility due to limited initiation and keeping eyes closed. With max cues patient hand held assist x2 to ambulate to bathroom taking small shuffled steps. Patient needing max encouragement to initiate sitting onto toilet. While seated patient with significant coughing, with encouragement takes small sip of water. Patient min x2 to stand from toilet and total A for peri care due to poor safety awareness and balance. Spouse still hopeful in taking patient home but hoping patient more alert and mobile by D/C. MD in room reporting patient likely to stay another night. Acute OT to follow.    Follow Up Recommendations  Supervision/Assistance - 24 hour;Other (comment) (pending progress, spouse interested in [facility])    Equipment Recommendations  Tub/shower seat       Precautions / Restrictions Precautions Precautions: Fall Precaution Comments: fall ~67yrago, HOH       Mobility Bed Mobility Overal bed mobility: Needs Assistance Bed Mobility: Supine to Sit;Sit to Supine     Supine to sit: Max assist;+2 for physical assistance;HOB elevated Sit to supine: Min assist;+2 for physical assistance   General bed mobility comments: limited initation for bed mobility, keeping eyes  closed. max A to bring legs to edge of bed and elevate trunk to sitting. with cues to lay towards pillow patient does lean trunk back needing min A x2 for safety and bring legs onto bed at end of session    Transfers Overall transfer level: Needs assistance Equipment used: 2 person hand held assist Transfers: Sit to/from Stand Sit to Stand: Min assist;+2 physical assistance;+2 safety/equipment         General transfer comment: please see toilet transfer in ADL section    Balance Overall balance assessment: History of Falls;Needs assistance Sitting-balance support: Bilateral upper extremity supported Sitting balance-Leahy Scale: Poor     Standing balance support: Bilateral upper extremity supported Standing balance-Leahy Scale: Poor Standing balance comment: reliant on external assist                           ADL either performed or assessed with clinical judgement   ADL Overall ADL's : Needs assistance/impaired                         Toilet Transfer: Minimal assistance;+2 for physical assistance;+2 for safety/equipment;Ambulation;Regular TGlass blower/designerDetails (indicate cue type and reason): hand held assistance x2, max cues to ambulate taking small shuffled steps to bathroom. max cues and increased time to get patient to sit onto toilet Toileting- Clothing Manipulation and Hygiene: Total assistance;Sit to/from stand Toileting - Clothing Manipulation Details (indicate cue type and reason): note patient bed spoiled therefore ambulated to bathroom. patient able to have small bowel movement on toilet. poor standing balance, safety awareness and arousal therefore total A for perianal care  in standing     Functional mobility during ADLs: Minimal assistance;+2 for physical assistance;+2 for safety/equipment;Cueing for safety;Cueing for sequencing General ADL Comments: patient still below her baseline with functional ambulation, decreased arousal needing  mod to max cues/stimulation to participate in self care tasks.      Cognition Arousal/Alertness: Lethargic (increased arousal with mobility) Behavior During Therapy: Flat affect Overall Cognitive Status: History of cognitive impairments - at baseline                                 General Comments: MD present upon arrival, explaining to family that patient likely more lethargic due to brain bleed. patient needing mod to max stimulation to participate with therapy                   Pertinent Vitals/ Pain       Pain Assessment: Faces Faces Pain Scale: No hurt         Frequency  Min 2X/week        Progress Toward Goals  OT Goals(current goals can now be found in the care plan section)  Progress towards OT goals: Not progressing toward goals - comment (decreased arousal this session)  Acute Rehab OT Goals Patient Stated Goal: "get her more mobile" OT Goal Formulation: With family Time For Goal Achievement: 04/23/21 Potential to Achieve Goals: Fair ADL Goals Pt Will Perform Lower Body Dressing: with min assist;sit to/from stand;sitting/lateral leans Pt Will Transfer to Toilet: with supervision;ambulating (elevated toilet seat) Additional ADL Goal #1: Patient will be supervision level for functional transfers and ambulation to reduce caregiver burden during daily routine  Plan Discharge plan remains appropriate       AM-PAC OT "6 Clicks" Daily Activity     Outcome Measure   Help from another person eating meals?: A Lot Help from another person taking care of personal grooming?: A Lot Help from another person toileting, which includes using toliet, bedpan, or urinal?: A Lot Help from another person bathing (including washing, rinsing, drying)?: A Lot Help from another person to put on and taking off regular upper body clothing?: A Lot Help from another person to put on and taking off regular lower body clothing?: Total 6 Click Score: 11    End of  Session    OT Visit Diagnosis: Unsteadiness on feet (R26.81);Other abnormalities of gait and mobility (R26.89);History of falling (Z91.81)   Activity Tolerance Patient limited by lethargy   Patient Left in bed;with call bell/phone within reach;with family/visitor present   Nurse Communication Mobility status        Time: [id number] OT Time Calculation (min): 24 min  Charges: OT General Charges $OT Visit: 1 Visit OT Treatments $Self Care/Home Management : 23-37 mins  Delbert Phenix OT OT pager: [place] 04/10/2021, 12:18 PM

## 2021-04-10 NOTE — Progress Notes (Signed)
PT Cancellation Note  Patient Details Name: ALISYN TACURI MRN: [id number] DOB: 10/13/1945   Cancelled Treatment:    Reason Eval/Treat Not Completed: Patient at procedure or test/unavailable Pt with OT and then going for MRI.  Will f/u in afternoon as able.  Abran Richard, PT Acute Rehab Services Pager [phone] [facility] [phone]   Karlton Lemon 04/10/2021, 11:39 AM

## 2021-04-10 NOTE — Progress Notes (Signed)
PROGRESS NOTE    Michelle [name]  [id number] DOB: 1946/06/29 DOA: 04/08/2021 PCP: Ann Held, DO    Brief Narrative:  75 year old female with history of dementia with behavioral disturbances, orthostatic hypotension, hypothyroidism presented to the emergency department after falling and hitting her head.  She lives at home with her husband.  Has progressively advancing dementia.  Patient was noted to be unresponsive for few minutes and mumbling even after waking up.  Complained of headache.  She is oriented to person only at baseline. On arrival to emergency room afebrile.  Hemodynamically stable.  CT C-spine negative.  CT head with small subdural hemorrhage of the right frontal convexity, multiple parenchymal hemorrhage with no mass-effect.  Admitted for observation.  Neurosurgery recommended no surgical intervention.   Assessment & Plan:   Principal Problem:   Intracranial hemorrhage following injury ([place]) Active Problems:   Hypothyroidism   Late onset Alzheimer's dementia with behavioral disturbance (HCC)   Orthostatic hypotension  Traumatic intracranial hemorrhage: CT scan consistent with multiple area of parenchymal hemorrhage, 4 mm subdural hematoma with no mass-effect.  Neurosurgery recommended conservative management.  Discontinue aspirin and other blood thinners. Work with PT OT and recommended rehab. Patient has a history of meningioma, a repeat MRI was done today that shows a stable appearing meningioma and extensive parenchymal hemorrhage as above.  Acute metabolic encephalopathy in a patient with Alzheimer's disease with behavioral disturbances: Patient noted to be more lethargic with dark urine today.  Low oral intake. Urinalysis consistent with UTI, will start on Rocephin.  Send urine cultures. Poor oral intake, start IV fluid for hydration.  Hypothyroidism: Replaced on Synthroid.   DVT prophylaxis: SCDs Start: 04/08/21 2358   Code Status: DNR Family  Communication: Husband and daughter at the bedside Disposition Plan: Status is: Observation  The patient will require care spanning > 2 midnights and should be moved to inpatient because: IV treatments appropriate due to intensity of illness or inability to take PO and Inpatient level of care appropriate due to severity of illness  Dispo: The patient is from: Home              Anticipated d/c is to: [facility]              Patient currently is not medically stable to d/c.   Difficult to place patient No         Consultants:  Neurosurgery  Procedures:  None  Antimicrobials:  Rocephin 8/3---   Subjective: Patient seen and examined.  Mostly sleepy.  Did not respond much.  She did work with physical therapy and followed minimum commands but no meaningful interaction. Remains afebrile. Family at bedside worried about patient not eating and drinking.  Objective: Vitals:   04/09/21 2336 04/10/21 0400 04/10/21 0422 04/10/21 1344  BP:  124/62  130/65  Pulse:  60  64  Resp:  15  18  Temp:  98.5 F (36.9 C)  98.8 F (37.1 C)  TempSrc:  Oral  Oral  SpO2:  97%  100%  Weight:   61.4 kg   Height: 5' 2.99" (1.6 m)  '5\' 3"'$  (1.6 m)     Intake/Output Summary (Last 24 hours) at 04/10/2021 1634 Last data filed at 04/10/2021 1600 Gross per 24 hour  Intake 686.46 ml  Output --  Net 686.46 ml   Filed Weights   04/10/21 0422  Weight: 61.4 kg    Examination:  General exam: Appears calm and comfortable  Mostly sleepy.  She is  alert and awake to stimulation but not oriented.  Only oriented to herself. Respiratory system: No added sounds. Cardiovascular system: S1 & S2 heard, RRR.  No additional sounds.   Gastrointestinal system: Abdomen is nondistended, soft and nontender. No organomegaly or masses felt. Normal bowel sounds heard. Extremities: Symmetric 5 x 5 power. Skin: No rashes, lesions or ulcers   Data Reviewed: I have personally reviewed following labs and imaging  studies  CBC: Recent Labs  Lab 04/08/21 2142 04/09/21 0500  WBC 8.6 10.0  NEUTROABS 7.1  --   HGB 11.1* 10.3*  HCT 34.1* 31.5*  MCV 89.5 89.5  PLT 241 [id number]   Basic Metabolic Panel: Recent Labs  Lab 04/08/21 2142 04/09/21 0500  NA 139 143  K 4.0 4.2  CL 103 110  CO2 27 27  GLUCOSE 119* 116*  BUN 20 16  CREATININE 1.06* 0.95  CALCIUM 8.9 8.8*   GFR: Estimated Creatinine Clearance: 42.3 mL/min (by C-G formula based on SCr of 0.95 mg/dL). Liver Function Tests: No results for input(s): AST, ALT, ALKPHOS, BILITOT, PROT, ALBUMIN in the last 168 hours. No results for input(s): LIPASE, AMYLASE in the last 168 hours. No results for input(s): AMMONIA in the last 168 hours. Coagulation Profile: No results for input(s): INR, PROTIME in the last 168 hours. Cardiac Enzymes: No results for input(s): CKTOTAL, CKMB, CKMBINDEX, TROPONINI in the last 168 hours. BNP (last 3 results) No results for input(s): PROBNP in the last 8760 hours. HbA1C: No results for input(s): HGBA1C in the last 72 hours. CBG: No results for input(s): GLUCAP in the last 168 hours. Lipid Profile: No results for input(s): CHOL, HDL, LDLCALC, TRIG, CHOLHDL, LDLDIRECT in the last 72 hours. Thyroid Function Tests: No results for input(s): TSH, T4TOTAL, FREET4, T3FREE, THYROIDAB in the last 72 hours. Anemia Panel: No results for input(s): VITAMINB12, FOLATE, FERRITIN, TIBC, IRON, RETICCTPCT in the last 72 hours. Sepsis Labs: No results for input(s): PROCALCITON, LATICACIDVEN in the last 168 hours.  Recent Results (from the past 240 hour(s))  Resp Panel by RT-PCR (Flu A&B, Covid) Nasopharyngeal Swab     Status: None   Collection Time: 04/08/21  9:42 PM   Specimen: Nasopharyngeal Swab; Nasopharyngeal(NP) swabs in vial transport medium  Result Value Ref Range Status   SARS Coronavirus 2 by RT PCR NEGATIVE NEGATIVE Final    Comment: (NOTE) SARS-CoV-2 target nucleic acids are NOT DETECTED.  The SARS-CoV-2 RNA is  generally detectable in upper respiratory specimens during the acute phase of infection. The lowest concentration of SARS-CoV-2 viral copies this assay can detect is 138 copies/mL. A negative result does not preclude SARS-Cov-2 infection and should not be used as the sole basis for treatment or other patient management decisions. A negative result may occur with  improper specimen collection/handling, submission of specimen other than nasopharyngeal swab, presence of viral mutation(s) within the areas targeted by this assay, and inadequate number of viral copies(<138 copies/mL). A negative result must be combined with clinical observations, patient history, and epidemiological information. The expected result is Negative.  Fact Sheet for Patients:  EntrepreneurPulse.com.au  Fact Sheet for Healthcare Providers:  IncredibleEmployment.be  This test is no t yet approved or cleared by the [place] FDA and  has been authorized for detection and/or diagnosis of SARS-CoV-2 by FDA under an Emergency Use Authorization (EUA). This EUA will remain  in effect (meaning this test can be used) for the duration of the COVID-19 declaration under Section 564(b)(1) of the Act, 21 U.S.C.section 360bbb-3(b)(1), unless  the authorization is terminated  or revoked sooner.       Influenza A by PCR NEGATIVE NEGATIVE Final   Influenza B by PCR NEGATIVE NEGATIVE Final    Comment: (NOTE) The Xpert Xpress SARS-CoV-2/FLU/RSV plus assay is intended as an aid in the diagnosis of influenza from Nasopharyngeal swab specimens and should not be used as a sole basis for treatment. Nasal washings and aspirates are unacceptable for Xpert Xpress SARS-CoV-2/FLU/RSV testing.  Fact Sheet for Patients: EntrepreneurPulse.com.au  Fact Sheet for Healthcare Providers: IncredibleEmployment.be  This test is not yet approved or cleared by the [place] FDA and has been authorized for detection and/or diagnosis of SARS-CoV-2 by FDA under an Emergency Use Authorization (EUA). This EUA will remain in effect (meaning this test can be used) for the duration of the COVID-19 declaration under Section 564(b)(1) of the Act, 21 U.S.C. section 360bbb-3(b)(1), unless the authorization is terminated or revoked.  Performed at [facility], [place] [address] [place], [place] [address]          Radiology Studies: DG Ribs Unilateral W/Chest Left  Result Date: 04/09/2021 CLINICAL DATA:  Fall EXAM: LEFT RIBS AND CHEST - 3+ VIEW COMPARISON:  Chest radiograph 02/07/2020 FINDINGS: There is no evidence of displaced rib fracture. Unchanged cardiac silhouette. No focal airspace disease. No pleural effusion or pneumothorax. Bilateral shoulder degenerative changes. Thoracic spondylosis. IMPRESSION: No evidence of displaced rib fracture. Electronically Signed   By: Maurine Simmering   On: 04/09/2021 17:38   CT HEAD WO CONTRAST (5MM)  Result Date: 04/08/2021 CLINICAL DATA:  Fall, left head strike EXAM: CT HEAD WITHOUT CONTRAST CT CERVICAL SPINE WITHOUT CONTRAST TECHNIQUE: Multidetector CT imaging of the head and cervical spine was performed following the standard protocol without intravenous contrast. Multiplanar CT image reconstructions of the cervical spine were also generated. COMPARISON:  None. FINDINGS: CT HEAD FINDINGS Brain: Multiple sites of hyperdense hemorrhage including: Subdural hemorrhage along the right frontal convexity measuring 4 mm in maximal thickness (6/23). Hyperdense subdural hemorrhage seen along the falx measuring up to 6 mm in maximal thickness Hyperdense subdural hemorrhage along the tentorium as well. Small intraparenchymal hemorrhage noted in the high left frontal lobe (3/25). Subarachnoid hemorrhage along the sulci of the left frontal lobe along the sylvian fissure (3/15). No significant midline shift or resulting mass  effect. No CT evident large vascular territory or cortically based infarct. No focal lesion. Vascular: Atherosclerotic calcification of the carotid siphons. No hyperdense vessel. Skull: Minimal left frontal scalp thickening. No large hematoma. No calvarial fracture. No other acute osseous abnormality. Sinuses/Orbits: Paranasal sinuses and mastoid air cells are predominantly clear. Orbital structures are unremarkable aside from prior lens extractions. Other: None CT CERVICAL SPINE FINDINGS Alignment: Cervical stabilization collar absent the time exam. No evidence of traumatic listhesis. No abnormally widened, perched or jumped facets. Normal alignment of the craniocervical and atlantoaxial articulations. Skull base and vertebrae: Motion degraded imaging may limit detection of subtle abnormalities. Bony fusion across the left C3-4 facets. Multilevel cervical spondylitic changes further detailed below. Additional arthrosis at the atlantodental and basion dens intervals. Soft tissues and spinal canal: No pre or paravertebral fluid or swelling. No visible canal hematoma. 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 Airways patent. No acute traumatic soft tissue abnormality of the neck. No suspicious adenopathy. Disc levels: Multilevel intervertebral disc height loss with spondylitic endplate changes. Ossification of posterior longitudinal ligament C5-C7 resulting in some mild to moderate canal stenosis. Additional disc osteophyte complexes partially effacing the  ventral thecal sac without significant canal impingement. Multilevel uncinate spurring facet hypertrophic changes resulting in some mild to moderate multilevel neural foraminal narrowing most pronounced C4-5 right changes are more moderate to severe. Upper chest: No acute abnormality in the upper chest or imaged lung apices. Other: Normal thyroid. IMPRESSION: Left frontal scalp swelling without calvarial fracture. Sites of intracranial  hemorrhage including subdural hemorrhage along the right frontal convexity and in a para falcine and tentorial distribution, left frontal intraparenchymal hemorrhage and left frontal subarachnoid hemorrhage. No significant resulting mass effect or midline shift. Imaging cervical spine imaging degraded by motion artifact. No acute cervical spine fracture or traumatic listhesis. Cervical spondylitic changes, as detailed above. 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 These results were called by telephone at the time of interpretation on 04/08/2021 at 9:22 pm to provider ADAM CURATOLO , who verbally acknowledged these results. Electronically Signed   By: Lovena Le M.D.   On: 04/08/2021 21:22   CT Cervical Spine Wo Contrast  Result Date: 04/08/2021 CLINICAL DATA:  Fall, left head strike EXAM: CT HEAD WITHOUT CONTRAST CT CERVICAL SPINE WITHOUT CONTRAST TECHNIQUE: Multidetector CT imaging of the head and cervical spine was performed following the standard protocol without intravenous contrast. Multiplanar CT image reconstructions of the cervical spine were also generated. COMPARISON:  None. FINDINGS: CT HEAD FINDINGS Brain: Multiple sites of hyperdense hemorrhage including: Subdural hemorrhage along the right frontal convexity measuring 4 mm in maximal thickness (6/23). Hyperdense subdural hemorrhage seen along the falx measuring up to 6 mm in maximal thickness Hyperdense subdural hemorrhage along the tentorium as well. Small intraparenchymal hemorrhage noted in the high left frontal lobe (3/25). Subarachnoid hemorrhage along the sulci of the left frontal lobe along the sylvian fissure (3/15). No significant midline shift or resulting mass effect. No CT evident large vascular territory or cortically based infarct. No focal lesion. Vascular: Atherosclerotic calcification of the carotid siphons. No hyperdense vessel. Skull: Minimal left frontal scalp thickening. No large hematoma. No  calvarial fracture. No other acute osseous abnormality. Sinuses/Orbits: Paranasal sinuses and mastoid air cells are predominantly clear. Orbital structures are unremarkable aside from prior lens extractions. Other: None CT CERVICAL SPINE FINDINGS Alignment: Cervical stabilization collar absent the time exam. No evidence of traumatic listhesis. No abnormally widened, perched or jumped facets. Normal alignment of the craniocervical and atlantoaxial articulations. Skull base and vertebrae: Motion degraded imaging may limit detection of subtle abnormalities. Bony fusion across the left C3-4 facets. Multilevel cervical spondylitic changes further detailed below. Additional arthrosis at the atlantodental and basion dens intervals. Soft tissues and spinal canal: No pre or paravertebral fluid or swelling. No visible canal hematoma. 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 Airways patent. No acute traumatic soft tissue abnormality of the neck. No suspicious adenopathy. Disc levels: Multilevel intervertebral disc height loss with spondylitic endplate changes. Ossification of posterior longitudinal ligament C5-C7 resulting in some mild to moderate canal stenosis. Additional disc osteophyte complexes partially effacing the ventral thecal sac without significant canal impingement. Multilevel uncinate spurring facet hypertrophic changes resulting in some mild to moderate multilevel neural foraminal narrowing most pronounced C4-5 right changes are more moderate to severe. Upper chest: No acute abnormality in the upper chest or imaged lung apices. Other: Normal thyroid. IMPRESSION: Left frontal scalp swelling without calvarial fracture. Sites of intracranial hemorrhage including subdural hemorrhage along the right frontal convexity and in a para falcine and tentorial distribution, left frontal intraparenchymal hemorrhage and left frontal subarachnoid hemorrhage. No significant resulting mass effect or  midline shift. Imaging cervical spine imaging degraded by motion artifact. No acute cervical spine fracture or traumatic listhesis. Cervical spondylitic changes, as detailed above. 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 These results were called by telephone at the time of interpretation on 04/08/2021 at 9:22 pm to provider ADAM CURATOLO , who verbally acknowledged these results. Electronically Signed   By: Lovena Le M.D.   On: 04/08/2021 21:22   MR BRAIN WO CONTRAST  Result Date: 04/10/2021 CLINICAL DATA:  Mental status change, persistent or worsening; history of meningioma. Additional history provided: Recent fall, advanced dementia, increased confusion, possible CVA. EXAM: MRI HEAD WITHOUT CONTRAST TECHNIQUE: Multiplanar, multiecho pulse sequences of the brain and surrounding structures were obtained without intravenous contrast. COMPARISON:  Prior head CT examinations 04/08/2021 and earlier. Brain MRI 05/06/2019. FINDINGS: Brain: Mild intermittent motion degradation. Moderate generalized cerebral atrophy. Comparatively mild cerebellar atrophy. Multiple hemorrhagic parenchymal contusions within the left frontal lobe measuring up to 11 mm. Redemonstrated small focus of acute hemorrhage within the septum pellucidum. Redemonstrated acute subdural hematoma along the falx and right cerebral hemisphere measuring up to 4 mm in greatest thickness. Subarachnoid hemorrhage along the left frontal lobe (along the anterior aspect of the left sylvian fissure). Small volume hemorrhage within the lateral ventricles has slightly increased in volume. No evidence of hydrocephalus at this time. Apparent cortical restricted diffusion within the left frontal lobe (along the anterior aspect of the left sylvian fissure), which may reflect artifact from subarachnoid blood products at this site, acute infarct or parenchymal contusion. Redemonstrated dural-based mass along the dorsal aspect of the clivus,  eccentric to the left, compatible with a known meningioma. This mass has not significantly changed in size as compared to the brain MRI of 05/06/2019, measuring 0.9 x 2.2 x 2.5 cm (AP x TV x CC) on today's study. Mild multifocal T2/FLAIR hyperintensity within the cerebral white matter, nonspecific but compatible with chronic small vessel ischemic disease. No midline shift. Vascular: Expected proximal arterial flow voids. Skull and upper cervical spine: No focal marrow lesion. Sinuses/Orbits: Visualized orbits show no acute finding. Bilateral lens replacements. Small right maxillary sinus mucous retention cyst. IMPRESSION: Intermittently motion degraded examination. Multiple acute hemorrhagic parenchymal contusions within the left frontal lobe measuring up to 11 mm. Redemonstrated small focus of acute hemorrhage within the septum pellucidum. Unchanged volume of acute subdural hemorrhage along the falx and right cerebral hemisphere, measuring up to 4 mm in greatest thickness. Grossly unchanged acute subarachnoid hemorrhage along the left frontal lobe (along the anterior aspect of the left sylvian fissure). Small-volume hemorrhage within the lateral ventricles has slightly increased. No evidence of hydrocephalus at this time. Apparent cortical restricted diffusion within the left frontal lobe (along the anterior aspect of the left sylvian fissure), which may reflect artifact from adjacent subarachnoid hemorrhage, acute infarct or parenchymal contusion. 2.2 x 2.5 cm meningioma along the dorsal aspect of the clivus, not significantly changed in size from the brain MRI of 05/06/2019. Parenchymal atrophy and mild chronic small vessel ischemic disease. Electronically Signed   By: Kellie Simmering DO   On: 04/10/2021 12:40   CT MAXILLOFACIAL WO CONTRAST  Result Date: 04/09/2021 CLINICAL DATA:  Fall EXAM: CT MAXILLOFACIAL WITHOUT CONTRAST TECHNIQUE: Multidetector CT imaging of the maxillofacial structures was performed.  Multiplanar CT image reconstructions were also generated. COMPARISON:  Head CT 04/08/2021 FINDINGS: Osseous: No fracture or mandibular dislocation. No destructive process. Orbits: Negative. No traumatic or inflammatory finding. Sinuses: Clear. Soft tissues: Negative. Limited intracranial: Unchanged pattern intracranial  hemorrhage IMPRESSION: 1. No facial fracture. 2. Unchanged pattern of intracranial hemorrhage. Electronically Signed   By: Ulyses Jarred M.D.   On: 04/09/2021 21:21        Scheduled Meds:  donepezil  10 mg Oral QHS   gabapentin  300 mg Oral BID   levothyroxine  50 mcg Oral Q0600   memantine  10 mg Oral BID   midodrine  5 mg Oral TID WC   sertraline  100 mg Oral Daily   Continuous Infusions:  sodium chloride 125 mL/hr at 04/10/21 1303   cefTRIAXone (ROCEPHIN)  IV 1 g (04/10/21 1625)     LOS: 0 days    Time spent: 30 minutes    Barb Merino, MD [facility] Pager [phone]

## 2021-04-11 DIAGNOSIS — D62 Acute posthemorrhagic anemia: Secondary | ICD-10-CM | POA: Diagnosis present

## 2021-04-11 DIAGNOSIS — W010XXA Fall on same level from slipping, tripping and stumbling without subsequent striking against object, initial encounter: Secondary | ICD-10-CM | POA: Diagnosis present

## 2021-04-11 DIAGNOSIS — R402242 Coma scale, best verbal response, confused conversation, at arrival to emergency department: Secondary | ICD-10-CM | POA: Diagnosis present

## 2021-04-11 DIAGNOSIS — R402142 Coma scale, eyes open, spontaneous, at arrival to emergency department: Secondary | ICD-10-CM | POA: Diagnosis present

## 2021-04-11 DIAGNOSIS — A499 Bacterial infection, unspecified: Secondary | ICD-10-CM | POA: Diagnosis not present

## 2021-04-11 DIAGNOSIS — Z86011 Personal history of benign neoplasm of the brain: Secondary | ICD-10-CM | POA: Diagnosis not present

## 2021-04-11 DIAGNOSIS — S065X0S Traumatic subdural hemorrhage without loss of consciousness, sequela: Secondary | ICD-10-CM | POA: Diagnosis not present

## 2021-04-11 DIAGNOSIS — Z20822 Contact with and (suspected) exposure to covid-19: Secondary | ICD-10-CM | POA: Diagnosis present

## 2021-04-11 DIAGNOSIS — Z66 Do not resuscitate: Secondary | ICD-10-CM | POA: Diagnosis present

## 2021-04-11 DIAGNOSIS — E039 Hypothyroidism, unspecified: Secondary | ICD-10-CM | POA: Diagnosis present

## 2021-04-11 DIAGNOSIS — S065X2S Traumatic subdural hemorrhage with loss of consciousness of 31 minutes to 59 minutes, sequela: Secondary | ICD-10-CM | POA: Diagnosis not present

## 2021-04-11 DIAGNOSIS — I951 Orthostatic hypotension: Secondary | ICD-10-CM | POA: Diagnosis present

## 2021-04-11 DIAGNOSIS — R519 Headache, unspecified: Secondary | ICD-10-CM | POA: Diagnosis not present

## 2021-04-11 DIAGNOSIS — E785 Hyperlipidemia, unspecified: Secondary | ICD-10-CM | POA: Diagnosis present

## 2021-04-11 DIAGNOSIS — E43 Unspecified severe protein-calorie malnutrition: Secondary | ICD-10-CM | POA: Diagnosis not present

## 2021-04-11 DIAGNOSIS — I629 Nontraumatic intracranial hemorrhage, unspecified: Secondary | ICD-10-CM | POA: Diagnosis present

## 2021-04-11 DIAGNOSIS — N182 Chronic kidney disease, stage 2 (mild): Secondary | ICD-10-CM | POA: Diagnosis present

## 2021-04-11 DIAGNOSIS — B9689 Other specified bacterial agents as the cause of diseases classified elsewhere: Secondary | ICD-10-CM | POA: Diagnosis present

## 2021-04-11 DIAGNOSIS — N390 Urinary tract infection, site not specified: Secondary | ICD-10-CM | POA: Diagnosis present

## 2021-04-11 DIAGNOSIS — F0280 Dementia in other diseases classified elsewhere without behavioral disturbance: Secondary | ICD-10-CM | POA: Diagnosis not present

## 2021-04-11 DIAGNOSIS — K219 Gastro-esophageal reflux disease without esophagitis: Secondary | ICD-10-CM | POA: Diagnosis present

## 2021-04-11 DIAGNOSIS — R402362 Coma scale, best motor response, obeys commands, at arrival to emergency department: Secondary | ICD-10-CM | POA: Diagnosis present

## 2021-04-11 DIAGNOSIS — H409 Unspecified glaucoma: Secondary | ICD-10-CM | POA: Diagnosis present

## 2021-04-11 DIAGNOSIS — G301 Alzheimer's disease with late onset: Secondary | ICD-10-CM | POA: Diagnosis present

## 2021-04-11 DIAGNOSIS — R4189 Other symptoms and signs involving cognitive functions and awareness: Secondary | ICD-10-CM | POA: Diagnosis not present

## 2021-04-11 DIAGNOSIS — F0281 Dementia in other diseases classified elsewhere with behavioral disturbance: Secondary | ICD-10-CM | POA: Diagnosis present

## 2021-04-11 DIAGNOSIS — G9341 Metabolic encephalopathy: Secondary | ICD-10-CM | POA: Diagnosis present

## 2021-04-11 DIAGNOSIS — G8929 Other chronic pain: Secondary | ICD-10-CM | POA: Diagnosis not present

## 2021-04-11 DIAGNOSIS — I129 Hypertensive chronic kidney disease with stage 1 through stage 4 chronic kidney disease, or unspecified chronic kidney disease: Secondary | ICD-10-CM | POA: Diagnosis present

## 2021-04-11 DIAGNOSIS — H9190 Unspecified hearing loss, unspecified ear: Secondary | ICD-10-CM | POA: Diagnosis present

## 2021-04-11 DIAGNOSIS — S065X0D Traumatic subdural hemorrhage without loss of consciousness, subsequent encounter: Secondary | ICD-10-CM | POA: Diagnosis not present

## 2021-04-11 DIAGNOSIS — Z8249 Family history of ischemic heart disease and other diseases of the circulatory system: Secondary | ICD-10-CM | POA: Diagnosis not present

## 2021-04-11 DIAGNOSIS — S06301A Unspecified focal traumatic brain injury with loss of consciousness of 30 minutes or less, initial encounter: Secondary | ICD-10-CM | POA: Diagnosis not present

## 2021-04-11 DIAGNOSIS — S065X9A Traumatic subdural hemorrhage with loss of consciousness of unspecified duration, initial encounter: Secondary | ICD-10-CM | POA: Diagnosis present

## 2021-04-11 DIAGNOSIS — G309 Alzheimer's disease, unspecified: Secondary | ICD-10-CM | POA: Diagnosis not present

## 2021-04-11 DIAGNOSIS — Z83511 Family history of glaucoma: Secondary | ICD-10-CM | POA: Diagnosis not present

## 2021-04-11 DIAGNOSIS — S066X9A Traumatic subarachnoid hemorrhage with loss of consciousness of unspecified duration, initial encounter: Secondary | ICD-10-CM | POA: Diagnosis present

## 2021-04-11 MED ORDER — GUAIFENESIN-DM 100-10 MG/5ML PO SYRP
5.0000 mL | ORAL_SOLUTION | ORAL | Status: DC | PRN
  Administered 2021-04-11: 5 mL via ORAL
  Filled 2021-04-11: qty 5

## 2021-04-11 NOTE — PMR Pre-admission (Addendum)
PMR Admission Coordinator Pre-Admission Assessment   Patient: Michelle [name] is an 75 y.o., female MRN: [id number] DOB: 08/02/1946 Height: 5' 3" (160 cm) Weight: 61.4 kg   Insurance Information HMO: yes POS    PPO:      PCP:      IPA:      80/20:      OTHER: Group [id number] PRIMARY: [employer] medicare      Policy#: [id number]      Subscriber: patient CM Name: n/a     Phone#: [phone]     Fax#: [phone]    [facility] called with approval for Michelle [name] on 04/12/21 for  7 day admission 04/12/21-04/18/21. Pre-Cert#: [id number]      Employer: Retired Benefits:  Phone #: [phone]     Name: [email or web address]         Eff. Date: 09/08/20     Deduct: $0      Out of Pocket Max: $4500 (met $106.92)      Life Max: N/A CIR: $325 days 1-5      SNF: $0 days 1-20; $188 days 21-44; $0 days 45-100 Outpatient: medical necessity     Co-Pay: $30 visit copay Home Health: 100%     Co-Pay: none DME: 80%     Co-Pay: 20% Providers: in network  SECONDARY:       Policy#:      Phone#:   Financial Counselor:       Phone#:   The "Data Collection Information Summary" for patients in Inpatient Rehabilitation Facilities with attached "Privacy Act Statement-Health Care Records" was provided and verbally reviewed with: Patient and Family   Emergency Contact Information Contact Information       Name Relation Home Work Mobile    Michelle [name],Michelle [name] Spouse [phone]    Michelle [name],Michelle [name] Daughter     [phone]    Michelle [name],Michelle [name] Daughter     [phone]           Current Medical History  Patient Admitting Diagnosis: SDH/SAH   History of Present Illness: Michelle [name] is a 75 y.o. female with medical history significant for dementia with behavioral disturbance, orthostatic hypotension, seasonal allergy, and hypothyroidism, now presenting to the emergency department after falling and hitting her head.  She is accompanied by her husband who provides much of the history.  Patient had been  in her usual state of health when she fell tonight, appeared to hit her forehead on the ground, and was briefly unconscious.  She began to mumble in response to questions from family and gradually became more alert over the next 20 minutes.  She was complaining of a headache after this.  She has baseline dementia, is able to dress herself, but requires assistance from her husband with bathing and meals.  She is oriented to person only at baseline.  She has chronic headaches and was started on gabapentin for prophylaxis.   ED Course: Upon arrival to the ED, patient was found to be afebrile, saturating well on room air, and with stable blood pressure.  Chemistry panel with mild renal insufficiency and CBC with hemoglobin 11.1.  CT cervical spine negative for acute fracture or traumatic listhesis.  Head CT with subdural hemorrhage along the right frontal convexity, along the falx, and along the tentorium.  Also noted on head CT small intraparenchymal hemorrhage in the high left frontal lobe and subarachnoid hemorrhage along the sulci of the left frontal lobe and sylvian fissure.  There is no significant midline shift or mass-effect on CT.    Neurosurgery was consulted by the ED physician and the patient was treated with Tylenol. Admitted for observation.  Neurosurgery recommended no surgical intervention.  PT/OT evaluations were completed with recommendations for acute inpatient rehab admission.   Patient's medical record from [place] has been reviewed by the rehabilitation admission coordinator and physician.   Past Medical History      Past Medical History:  Diagnosis Date   Adenomatous colon polyp     Allergy     Dementia (HCC)     GERD (gastroesophageal reflux disease)     Glaucoma     Hx of hearing loss      bilateral hearing aids   Hyperlipidemia     Hypertension     IBS (irritable bowel syndrome)     Low back pain     Thyroid disease        Family History   family history includes  Breast cancer in her maternal grandmother; Cancer in her father; Cancer (age of onset: 59) in her brother; Emphysema in her mother; Glaucoma in her mother; Hypertension in her father and mother; Macular degeneration in her father; Other in her brother; Prostate cancer in her brother and father.   Prior Rehab/Hospitalizations Has the patient had prior rehab or hospitalizations prior to admission? Yes   Has the patient had major surgery during 100 days prior to admission? No               Current Medications   Current Facility-Administered Medications:   0.9 %  sodium chloride infusion, , Intravenous, Continuous, Michelle [name], Kuber, MD, Last Rate: 125 mL/hr at 04/11/21 0455, New Bag at 04/11/21 0455   acetaminophen (TYLENOL) tablet 650 mg, 650 mg, Oral, Q6H PRN **OR** acetaminophen (TYLENOL) suppository 650 mg, 650 mg, Rectal, Q6H PRN, [name], Michelle S, MD   cefTRIAXone (ROCEPHIN) 1 g in sodium chloride 0.9 % 100 mL IVPB, 1 g, Intravenous, Q24H, Michelle [name], Kuber, MD, Last Rate: 200 mL/hr at 04/10/21 1625, 1 g at 04/10/21 1625   donepezil (ARICEPT) tablet 10 mg, 10 mg, Oral, QHS, [name], Michelle S, MD, 10 mg at 04/10/21 2113   gabapentin (NEURONTIN) capsule 300 mg, 300 mg, Oral, BID, [name], Michelle S, MD, 300 mg at 04/11/21 1003   haloperidol lactate (HALDOL) injection 2 mg, 2 mg, Intravenous, Q6H PRN, [name], Michelle S, MD, 2 mg at 04/09/21 0105   levothyroxine (SYNTHROID) tablet 50 mcg, 50 mcg, Oral, Q0600, [name], Michelle S, MD, 50 mcg at 04/11/21 0641   memantine (NAMENDA) tablet 10 mg, 10 mg, Oral, BID, [name], Michelle S, MD, 10 mg at 04/11/21 1004   midodrine (PROAMATINE) tablet 5 mg, 5 mg, Oral, TID WC, [name], Michelle S, MD, 5 mg at 04/11/21 1240   morphine 2 MG/ML injection 2 mg, 2 mg, Intravenous, Q3H PRN, [name], Michelle S, MD   ondansetron (ZOFRAN) tablet 4 mg, 4 mg, Oral, Q6H PRN **OR** ondansetron (ZOFRAN) injection 4 mg, 4 mg, Intravenous, Q6H PRN, [name], Michelle S, MD   senna-docusate (Senokot-S) tablet  1 tablet, 1 tablet, Oral, QHS PRN, [name], Michelle S, MD   sertraline (ZOLOFT) tablet 100 mg, 100 mg, Oral, Daily, [name], Michelle S, MD, 100 mg at 04/11/21 1004   Patients Current Diet:  Diet Order                  Diet Heart Room service appropriate? Yes; Fluid consistency: Thin  Diet effective now                           Precautions / Restrictions Precautions Precautions: Fall Precaution Comments: HOH, mits Restrictions Weight Bearing Restrictions: No    Has the patient had 2 or more falls or a fall with injury in the past year? Yes   Prior Activity Level Limited Community (1-2x/wk): Went out 2-3 X a week with husband, patient did not drive.   Prior Functional Level Self Care: Did the patient need help bathing, dressing, using the toilet or eating? Needed some help   Indoor Mobility: Did the patient need assistance with walking from room to room (with or without device)? Needed some help   Stairs: Did the patient need assistance with internal or external stairs (with or without device)? Needed some help   Functional Cognition: Did the patient need help planning regular tasks such as shopping or remembering to take medications? Needed some help   Home Assistive Devices / Equipment Home Assistive Devices/Equipment: Cane (specify quad or straight), Eyeglasses, Walker (specify type), Other (Comment) (tub/shower unit, walk-in shower, front wheeled walker, single point cane, handicap height toilet) Home Equipment: Walker - 2 wheels   Prior Device Use: Indicate devices/aids used by the patient prior to current illness, exacerbation or injury? None of the above   Current Functional Level Cognition   Overall Cognitive Status: History of cognitive impairments - at baseline Difficult to assess due to: Level of arousal Orientation Level: Oriented to person, Disoriented to place, Disoriented to time, Disoriented to situation General Comments: spouse reporting patient more awake  throughout the day today, was easier to arouse this OT session. following directions with increased time    Extremity Assessment (includes Sensation/Coordination)   Upper Extremity Assessment: Defer to OT evaluation  Lower Extremity Assessment: Difficult to assess due to impaired cognition (Pt not following MMT commands.  Does demonstrate ROM WFL and at least 4/5 strength throughout (keeping legs extended in sitting and therapist providing resistance to get feet on floor, not able to formally MMT))     ADLs   Overall ADL's : Needs assistance/impaired Grooming: Supervision/safety, Set up, Sitting Upper Body Bathing: Minimal assistance, Sitting Lower Body Bathing: Moderate assistance, Sit to/from stand, Sitting/lateral leans Upper Body Dressing : Minimal assistance, Sitting Lower Body Dressing: Total assistance, Sitting/lateral leans Lower Body Dressing Details (indicate cue type and reason): to don socks, spouse reports he helps her at baseline "sometimes I'm short on patience" Toilet Transfer: Minimal assistance, +2 for physical assistance, +2 for safety/equipment, Ambulation, Regular Toilet Toilet Transfer Details (indicate cue type and reason): hand held assistance x2, max cues to ambulate taking small shuffled steps to bathroom. increased carry over of cues this session to initiate sitting onto toilet Toileting- Clothing Manipulation and Hygiene: Total assistance, Sit to/from stand Toileting - Clothing Manipulation Details (indicate cue type and reason): patient incontinent of stool in bed and had small bowel movement on toilet, reliant on Ue support and limited cognition therefore total A for peri care Functional mobility during ADLs: Minimal assistance, +2 for physical assistance, +2 for safety/equipment, Cueing for safety, Cueing for sequencing General ADL Comments: patient still below her baseline with functional ambulation, decreased arousal needing mod to max cues/stimulation to  participate in self care tasks.     Mobility   Overal bed mobility: Needs Assistance Bed Mobility: Supine to Sit, Sit to Supine Supine to sit: Mod assist, +2 for physical assistance Sit to supine: Min assist, +2 for physical assistance General bed mobility comments: limited initiation to sit up at edge of bed     Transfers   Overall transfer   level: Needs assistance Equipment used: 2 person hand held assist Transfers: Sit to/from Stand Sit to Stand: Min assist, +2 physical assistance, +2 safety/equipment, Mod assist General transfer comment: please see toilet transfer in ADL section     Ambulation / Gait / Stairs / Wheelchair Mobility   Ambulation/Gait Ambulation/Gait assistance: Mod assist, +2 safety/equipment, +2 physical assistance Gait Distance (Feet): 50 Feet Assistive device: 2 person hand held assist Gait Pattern/deviations: Step-to pattern, Decreased stride length, Shuffle, Decreased dorsiflexion - right, Decreased dorsiflexion - left General Gait Details: Pt with shuffle gait and requiring HHA to steady and lead direction.  She did turn with cues.  Very short shuffle steps - cues and assist to weight shift to increase stride length - had 1 episode that was able to improve step length but mostly shuffled Gait velocity: decreased     Posture / Balance Dynamic Sitting Balance Sitting balance - Comments: patient leans posteriorly and keeps legs crossed with feet not touching the floor Balance Overall balance assessment: History of Falls, Needs assistance Sitting-balance support: Feet unsupported Sitting balance-Leahy Scale: Poor Sitting balance - Comments: patient leans posteriorly and keeps legs crossed with feet not touching the floor Standing balance support: Bilateral upper extremity supported Standing balance-Leahy Scale: Poor Standing balance comment: HHA x2     Special needs/care consideration Continuous Drip IV  NS 125 ml/hr and Behavioral consideration Has dementia     Previous Home Environment (from acute therapy documentation) Living Arrangements: Spouse/significant other Available Help at Discharge: Family, Available 24 hours/day Type of Home: House Home Layout: One level Home Access: Stairs to enter Entrance Stairs-Rails: Right, Left Entrance Stairs-Number of Steps: 5 Bathroom Shower/Tub: Walk-in shower Bathroom Toilet: Handicapped height Home Care Services: No   Discharge Living Setting Plans for Discharge Living Setting: Patient's home, House, Lives with (comment) (Lives with husband) Type of Home at Discharge: House Discharge Home Layout: One level Discharge Home Access: Stairs to enter Entrance Stairs-Rails: Right, Left, Can reach both Entrance Stairs-Number of Steps: 5 at carport entrance Discharge Bathroom Shower/Tub: Walk-in shower, Door Discharge Bathroom Toilet: Handicapped height Discharge Bathroom Accessibility: No Does the patient have any problems obtaining your medications?: No   Social/Family/Support Systems Patient Roles: Spouse, Parent (Has husband, dtr and Gson.) Contact Information: Michelle [name] - husband Anticipated Caregiver: Husband Anticipated Caregiver's Contact Information: Michelle [name] - husband - (c) [phone] Ability/Limitations of Caregiver: Husband can provide supervision and care as he has done for 6 years. Caregiver Availability: 24/7 Discharge Plan Discussed with Primary Caregiver: Yes Is Caregiver In Agreement with Plan?: Yes Does Caregiver/Family have Issues with Lodging/Transportation while Pt is in Rehab?: No   Goals Patient/Family Goal for Rehab: PT/OT/SLP supervision to min assist goals Expected length of stay: 12-14 days Cultural Considerations: None Pt/Family Agrees to Admission and willing to participate: Yes Program Orientation Provided & Reviewed with Pt/Caregiver Including Roles  & Responsibilities: Yes   Decrease burden of Care through IP rehab admission: N/A   Possible need  for SNF placement upon discharge: Not planned   Patient Condition: I have reviewed medical records from [place], spoken with CM, and spouse. I discussed via phone for inpatient rehabilitation assessment.  Patient will benefit from ongoing PT and OT, can actively participate in 3 hours of therapy a day 5 days of the week, and can make measurable gains during the admission.  Patient will also benefit from the coordinated team approach during an Inpatient Acute Rehabilitation admission.  The patient will receive intensive therapy as well as Rehabilitation   physician, nursing, social worker, and care management interventions.  Due to bladder management, bowel management, safety, skin/wound care, disease management, medication administration, pain management, and patient education the patient requires 24 hour a day rehabilitation nursing.  The patient is currently min to mod assist with mobility and basic ADLs.  Discharge setting and therapy post discharge at home with home health is anticipated.  Patient has agreed to participate in the Acute Inpatient Rehabilitation Program and will admit today.   Preadmission Screen Completed By:  Michelle [name], 04/11/2021 4:03 PM with updates by Michelle Staley MS CCC-SLP ______________________________________________________________________   Discussed status with Michelle [name] on 04/09/21 at 1100 and received approval for admission today.   Admission Coordinator:  Michelle M Logue, RN, time 1130/Date 04/12/21    Assessment/Plan: Diagnosis: traumatic SDH/SAH Does the need for close, 24 hr/day Medical supervision in concert with the patient's rehab needs make it unreasonable for this patient to be served in a less intensive setting? Yes Co-Morbidities requiring supervision/potential complications: dementia, orthostasis Due to bladder management, bowel management, safety, skin/wound care, disease management, medication administration, pain management, and patient education, does  the patient require 24 hr/day rehab nursing? Yes Does the patient require coordinated care of a physician, rehab nurse, PT, OT, and SLP to address physical and functional deficits in the context of the above medical diagnosis(es)? Yes Addressing deficits in the following areas: balance, endurance, locomotion, strength, transferring, bowel/bladder control, bathing, dressing, feeding, grooming, toileting, cognition, and psychosocial support Can the patient actively participate in an intensive therapy program of at least 3 hrs of therapy 5 days a week? Yes The potential for patient to make measurable gains while on inpatient rehab is excellent Anticipated functional outcomes upon discharge from inpatient rehab: supervision and min assist PT, supervision and min assist OT, supervision and min assist SLP supervision to min assist Estimated rehab length of stay to reach the above functional goals is: 12-14 days Anticipated discharge destination: Home 10. Overall Rehab/Functional Prognosis: good     MD Signature: Michelle Atwater T. Amoni Morales, MD, FAAPMR [facility] 04/12/2021      

## 2021-04-11 NOTE — Progress Notes (Signed)
IP rehab admissions - I spoke with patient's husband.  He is interested in CIR but he wants me to check benefits first.  I will call husband again after benefits are confirmed.  I will also open the case and request acute inpatient rehab admission.  Will update all after I hear back from insurance carrier.  Call for questions.  [phone]

## 2021-04-11 NOTE — Progress Notes (Signed)
PROGRESS NOTE    Michelle [name]  [id number] DOB: 1946/06/21 DOA: 04/08/2021 PCP: Ann Held, DO    Brief Narrative:  75 year old female with history of dementia with behavioral disturbances, orthostatic hypotension, hypothyroidism presented to the emergency department after falling and hitting her head.  She lives at home with her husband.  Has progressively advancing dementia.  Patient was noted to be unresponsive for few minutes and mumbling even after waking up.  Complained of headache.  She is oriented to person only at baseline. On arrival to emergency room afebrile.  Hemodynamically stable.  CT C-spine negative.  CT head with small subdural hemorrhage of the right frontal convexity, multiple parenchymal hemorrhage with no mass-effect.  Admitted for observation.  Neurosurgery recommended no surgical intervention.   Assessment & Plan:   Principal Problem:   Intracranial hemorrhage following injury ([place]) Active Problems:   Hypothyroidism   Late onset Alzheimer's dementia with behavioral disturbance (HCC)   Orthostatic hypotension  Traumatic intracranial hemorrhage: CT scan consistent with multiple area of parenchymal hemorrhage, 4 mm subdural hematoma with no mass-effect.  Neurosurgery recommended conservative management.  Discontinue aspirin and other blood thinners. Work with PT OT and recommended rehab. Patient has a history of meningioma, a repeat MRI was done that shows a stable appearing meningioma and extensive parenchymal hemorrhage as above.  Acute metabolic encephalopathy in a patient with Alzheimer's disease with behavioral disturbances: Urinalysis consistent with UTI, will start on Rocephin.  Send urine cultures. Poor oral intake, keep on IV fluids. Fall precautions.  Delirium precautions.  Hypothyroidism: Replaced on Synthroid.   DVT prophylaxis: SCDs Start: 04/08/21 2358   Code Status: DNR Family Communication: Husband at the bedside. Disposition  Plan: Status is: Observation  The patient will require care spanning > 2 midnights and should be moved to inpatient because: IV treatments appropriate due to intensity of illness or inability to take PO and Inpatient level of care appropriate due to severity of illness  Dispo: The patient is from: Home              Anticipated d/c is to: CIR              Patient currently is not medically stable to d/c.   Difficult to place patient No         Consultants:  Neurosurgery  Procedures:  None  Antimicrobials:  Rocephin 8/3---   Subjective: Patient seen and examined.  Was sleepy.  Woke up with the stimulation, she tells she feels fine.  Does not follow most commands.  Does not recognize her husband at the bedside.  Afebrile.  Minimal oral intake and difficult to keep awake.  Objective: Vitals:   04/10/21 0422 04/10/21 1344 04/10/21 1955 04/11/21 0359  BP:  130/65 (!) 133/57 121/63  Pulse:  64 70 (!) 59  Resp:  '18 15 15  '$ Temp:  98.8 F (37.1 C) 98.9 F (37.2 C) 98 F (36.7 C)  TempSrc:  Oral Oral Oral  SpO2:  100% 97% 95%  Weight: 61.4 kg     Height: '5\' 3"'$  (1.6 m)       Intake/Output Summary (Last 24 hours) at 04/11/2021 1249 Last data filed at 04/11/2021 [id number] Gross per 24 hour  Intake 2123.78 ml  Output 1150 ml  Net 973.78 ml   Filed Weights   04/10/21 0422  Weight: 61.4 kg    Examination:  General exam: Appears calm and comfortable  Mostly sleepy.  Alert to stimulation.  Oriented to self  only. Respiratory system: No added sounds. Cardiovascular system: S1 & S2 heard, RRR.  No additional sounds.   Gastrointestinal system: Abdomen is nondistended, soft and nontender. No organomegaly or masses felt. Normal bowel sounds heard. Extremities: Symmetric 5 x 5 power. Skin: No rashes, lesions or ulcers   Data Reviewed: I have personally reviewed following labs and imaging studies  CBC: Recent Labs  Lab 04/08/21 2142 04/09/21 0500  WBC 8.6 10.0  NEUTROABS 7.1   --   HGB 11.1* 10.3*  HCT 34.1* 31.5*  MCV 89.5 89.5  PLT 241 [id number]   Basic Metabolic Panel: Recent Labs  Lab 04/08/21 2142 04/09/21 0500  NA 139 143  K 4.0 4.2  CL 103 110  CO2 27 27  GLUCOSE 119* 116*  BUN 20 16  CREATININE 1.06* 0.95  CALCIUM 8.9 8.8*   GFR: Estimated Creatinine Clearance: 42.3 mL/min (by C-G formula based on SCr of 0.95 mg/dL). Liver Function Tests: No results for input(s): AST, ALT, ALKPHOS, BILITOT, PROT, ALBUMIN in the last 168 hours. No results for input(s): LIPASE, AMYLASE in the last 168 hours. No results for input(s): AMMONIA in the last 168 hours. Coagulation Profile: No results for input(s): INR, PROTIME in the last 168 hours. Cardiac Enzymes: No results for input(s): CKTOTAL, CKMB, CKMBINDEX, TROPONINI in the last 168 hours. BNP (last 3 results) No results for input(s): PROBNP in the last 8760 hours. HbA1C: No results for input(s): HGBA1C in the last 72 hours. CBG: No results for input(s): GLUCAP in the last 168 hours. Lipid Profile: No results for input(s): CHOL, HDL, LDLCALC, TRIG, CHOLHDL, LDLDIRECT in the last 72 hours. Thyroid Function Tests: No results for input(s): TSH, T4TOTAL, FREET4, T3FREE, THYROIDAB in the last 72 hours. Anemia Panel: No results for input(s): VITAMINB12, FOLATE, FERRITIN, TIBC, IRON, RETICCTPCT in the last 72 hours. Sepsis Labs: No results for input(s): PROCALCITON, LATICACIDVEN in the last 168 hours.  Recent Results (from the past 240 hour(s))  Resp Panel by RT-PCR (Flu A&B, Covid) Nasopharyngeal Swab     Status: None   Collection Time: 04/08/21  9:42 PM   Specimen: Nasopharyngeal Swab; Nasopharyngeal(NP) swabs in vial transport medium  Result Value Ref Range Status   SARS Coronavirus 2 by RT PCR NEGATIVE NEGATIVE Final    Comment: (NOTE) SARS-CoV-2 target nucleic acids are NOT DETECTED.  The SARS-CoV-2 RNA is generally detectable in upper respiratory specimens during the acute phase of infection. The  lowest concentration of SARS-CoV-2 viral copies this assay can detect is 138 copies/mL. A negative result does not preclude SARS-Cov-2 infection and should not be used as the sole basis for treatment or other patient management decisions. A negative result may occur with  improper specimen collection/handling, submission of specimen other than nasopharyngeal swab, presence of viral mutation(s) within the areas targeted by this assay, and inadequate number of viral copies(<138 copies/mL). A negative result must be combined with clinical observations, patient history, and epidemiological information. The expected result is Negative.  Fact Sheet for Patients:  EntrepreneurPulse.com.au  Fact Sheet for Healthcare Providers:  IncredibleEmployment.be  This test is no t yet approved or cleared by the [place] FDA and  has been authorized for detection and/or diagnosis of SARS-CoV-2 by FDA under an Emergency Use Authorization (EUA). This EUA will remain  in effect (meaning this test can be used) for the duration of the COVID-19 declaration under Section 564(b)(1) of the Act, 21 U.S.C.section 360bbb-3(b)(1), unless the authorization is terminated  or revoked sooner.  Influenza A by PCR NEGATIVE NEGATIVE Final   Influenza B by PCR NEGATIVE NEGATIVE Final    Comment: (NOTE) The Xpert Xpress SARS-CoV-2/FLU/RSV plus assay is intended as an aid in the diagnosis of influenza from Nasopharyngeal swab specimens and should not be used as a sole basis for treatment. Nasal washings and aspirates are unacceptable for Xpert Xpress SARS-CoV-2/FLU/RSV testing.  Fact Sheet for Patients: EntrepreneurPulse.com.au  Fact Sheet for Healthcare Providers: IncredibleEmployment.be  This test is not yet approved or cleared by the [place] FDA and has been authorized for detection and/or diagnosis of SARS-CoV-2 by FDA under  an Emergency Use Authorization (EUA). This EUA will remain in effect (meaning this test can be used) for the duration of the COVID-19 declaration under Section 564(b)(1) of the Act, 21 U.S.C. section 360bbb-3(b)(1), unless the authorization is terminated or revoked.  Performed at [facility], [place] [address], [place], [place] [address]          Radiology Studies: DG Ribs Unilateral W/Chest Left  Result Date: 04/09/2021 CLINICAL DATA:  Fall EXAM: LEFT RIBS AND CHEST - 3+ VIEW COMPARISON:  Chest radiograph 02/07/2020 FINDINGS: There is no evidence of displaced rib fracture. Unchanged cardiac silhouette. No focal airspace disease. No pleural effusion or pneumothorax. Bilateral shoulder degenerative changes. Thoracic spondylosis. IMPRESSION: No evidence of displaced rib fracture. Electronically Signed   By: Maurine Simmering   On: 04/09/2021 17:38   MR BRAIN WO CONTRAST  Result Date: 04/10/2021 CLINICAL DATA:  Mental status change, persistent or worsening; history of meningioma. Additional history provided: Recent fall, advanced dementia, increased confusion, possible CVA. EXAM: MRI HEAD WITHOUT CONTRAST TECHNIQUE: Multiplanar, multiecho pulse sequences of the brain and surrounding structures were obtained without intravenous contrast. COMPARISON:  Prior head CT examinations 04/08/2021 and earlier. Brain MRI 05/06/2019. FINDINGS: Brain: Mild intermittent motion degradation. Moderate generalized cerebral atrophy. Comparatively mild cerebellar atrophy. Multiple hemorrhagic parenchymal contusions within the left frontal lobe measuring up to 11 mm. Redemonstrated small focus of acute hemorrhage within the septum pellucidum. Redemonstrated acute subdural hematoma along the falx and right cerebral hemisphere measuring up to 4 mm in greatest thickness. Subarachnoid hemorrhage along the left frontal lobe (along the anterior aspect of the left sylvian fissure). Small volume hemorrhage within the  lateral ventricles has slightly increased in volume. No evidence of hydrocephalus at this time. Apparent cortical restricted diffusion within the left frontal lobe (along the anterior aspect of the left sylvian fissure), which may reflect artifact from subarachnoid blood products at this site, acute infarct or parenchymal contusion. Redemonstrated dural-based mass along the dorsal aspect of the clivus, eccentric to the left, compatible with a known meningioma. This mass has not significantly changed in size as compared to the brain MRI of 05/06/2019, measuring 0.9 x 2.2 x 2.5 cm (AP x TV x CC) on today's study. Mild multifocal T2/FLAIR hyperintensity within the cerebral white matter, nonspecific but compatible with chronic small vessel ischemic disease. No midline shift. Vascular: Expected proximal arterial flow voids. Skull and upper cervical spine: No focal marrow lesion. Sinuses/Orbits: Visualized orbits show no acute finding. Bilateral lens replacements. Small right maxillary sinus mucous retention cyst. IMPRESSION: Intermittently motion degraded examination. Multiple acute hemorrhagic parenchymal contusions within the left frontal lobe measuring up to 11 mm. Redemonstrated small focus of acute hemorrhage within the septum pellucidum. Unchanged volume of acute subdural hemorrhage along the falx and right cerebral hemisphere, measuring up to 4 mm in greatest thickness. Grossly unchanged acute subarachnoid hemorrhage along the left frontal lobe (  along the anterior aspect of the left sylvian fissure). Small-volume hemorrhage within the lateral ventricles has slightly increased. No evidence of hydrocephalus at this time. Apparent cortical restricted diffusion within the left frontal lobe (along the anterior aspect of the left sylvian fissure), which may reflect artifact from adjacent subarachnoid hemorrhage, acute infarct or parenchymal contusion. 2.2 x 2.5 cm meningioma along the dorsal aspect of the clivus, not  significantly changed in size from the brain MRI of 05/06/2019. Parenchymal atrophy and mild chronic small vessel ischemic disease. Electronically Signed   By: Kellie Simmering DO   On: 04/10/2021 12:40   CT MAXILLOFACIAL WO CONTRAST  Result Date: 04/09/2021 CLINICAL DATA:  Fall EXAM: CT MAXILLOFACIAL WITHOUT CONTRAST TECHNIQUE: Multidetector CT imaging of the maxillofacial structures was performed. Multiplanar CT image reconstructions were also generated. COMPARISON:  Head CT 04/08/2021 FINDINGS: Osseous: No fracture or mandibular dislocation. No destructive process. Orbits: Negative. No traumatic or inflammatory finding. Sinuses: Clear. Soft tissues: Negative. Limited intracranial: Unchanged pattern intracranial hemorrhage IMPRESSION: 1. No facial fracture. 2. Unchanged pattern of intracranial hemorrhage. Electronically Signed   By: Ulyses Jarred M.D.   On: 04/09/2021 21:21        Scheduled Meds:  donepezil  10 mg Oral QHS   gabapentin  300 mg Oral BID   levothyroxine  50 mcg Oral Q0600   memantine  10 mg Oral BID   midodrine  5 mg Oral TID WC   sertraline  100 mg Oral Daily   Continuous Infusions:  sodium chloride 125 mL/hr at 04/11/21 0455   cefTRIAXone (ROCEPHIN)  IV 1 g (04/10/21 1625)     LOS: 0 days    Time spent: 30 minutes    Barb Merino, MD [facility] Pager [phone]

## 2021-04-11 NOTE — Progress Notes (Signed)
Occupational Therapy Treatment Patient Details Name: Michelle [name] MRN: [id number] DOB: 08-27-1946 Today's Date: 04/11/2021    History of present illness Patient is a 75 year old female presenting to the emergency department after falling and hitting her head on 04/08/21. 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 No mass effect, all lesions non operative. PMH includes dementia with behavioral disturbance, orthostatic hypotension, seasonal allergy, and hypothyroidism   OT comments  Patient still needing x2 hand held assistance for functional ambulation which is below her baseline. More awake today and following directions more consistently just with increased time for ambulation. Sat onto toilet with less cues needed. Note patient bed sheet soiled upon standing and able to have small bowel movement on toilet, due to reliant on UE support for balance needing total A for perianal care. Spouse looking into CIR as patient is still not ambulating and transferring as well as she was prior to hospitalization after 3 consistent days of OT. D/C recommendations updated and will continue to follow acutely.    Follow Up Recommendations  CIR    Equipment Recommendations  Tub/shower seat       Precautions / Restrictions Precautions Precautions: Fall Precaution Comments: HOH, mits       Mobility Bed Mobility Overal bed mobility: Needs Assistance Bed Mobility: Supine to Sit;Sit to Supine     Supine to sit: Mod assist;+2 for physical assistance Sit to supine: Min assist;+2 for physical assistance   General bed mobility comments: limited initiation to sit up at edge of bed    Transfers Overall transfer level: Needs assistance Equipment used: 2 person hand held assist Transfers: Sit to/from Stand Sit to Stand: Min assist;+2 physical assistance;+2 safety/equipment;Mod assist         General transfer comment: please see toilet  transfer in ADL section    Balance Overall balance assessment: History of Falls;Needs assistance Sitting-balance support: Feet unsupported Sitting balance-Leahy Scale: Poor Sitting balance - Comments: patient leans posteriorly and keeps legs crossed with feet not touching the floor   Standing balance support: Bilateral upper extremity supported Standing balance-Leahy Scale: Poor Standing balance comment: HHA x2                           ADL either performed or assessed with clinical judgement   ADL Overall ADL's : Needs assistance/impaired                         Toilet Transfer: Minimal assistance;+2 for physical assistance;+2 for safety/equipment;Ambulation;Regular Glass blower/designer Details (indicate cue type and reason): hand held assistance x2, max cues to ambulate taking small shuffled steps to bathroom. increased carry over of cues this session to initiate sitting onto toilet Toileting- Clothing Manipulation and Hygiene: Total assistance;Sit to/from stand Toileting - Clothing Manipulation Details (indicate cue type and reason): patient incontinent of stool in bed and had small bowel movement on toilet, reliant on Ue support and limited cognition therefore total A for peri care     Functional mobility during ADLs: Minimal assistance;+2 for physical assistance;+2 for safety/equipment;Cueing for safety;Cueing for sequencing        Cognition Arousal/Alertness: Awake/alert Behavior During Therapy: Flat affect Overall Cognitive Status: History of cognitive impairments - at baseline  General Comments: spouse reporting patient more awake throughout the day today, was easier to arouse this OT session. following directions with increased time                   Pertinent Vitals/ Pain       Pain Assessment: Faces Faces Pain Scale: Hurts little more Pain Location: peri area with peri care Pain Descriptors /  Indicators: Grimacing Pain Intervention(s): Monitored during session         Frequency  Min 2X/week        Progress Toward Goals  OT Goals(current goals can now be found in the care plan section)  Progress towards OT goals: Progressing toward goals  Acute Rehab OT Goals Patient Stated Goal: "get her more mobile" OT Goal Formulation: With family Time For Goal Achievement: 04/23/21 Potential to Achieve Goals: Fair ADL Goals Pt Will Perform Lower Body Dressing: with min assist;sit to/from stand;sitting/lateral leans Pt Will Transfer to Toilet: with supervision;ambulating (elevated toilet seat) Additional ADL Goal #1: Patient will be supervision level for functional transfers and ambulation to reduce caregiver burden during daily routine  Plan Discharge plan needs to be updated       AM-PAC OT "6 Clicks" Daily Activity     Outcome Measure   Help from another person eating meals?: A Lot Help from another person taking care of personal grooming?: A Lot Help from another person toileting, which includes using toliet, bedpan, or urinal?: A Lot Help from another person bathing (including washing, rinsing, drying)?: A Lot Help from another person to put on and taking off regular upper body clothing?: A Lot Help from another person to put on and taking off regular lower body clothing?: Total 6 Click Score: 11    End of Session    OT Visit Diagnosis: Unsteadiness on feet (R26.81);Other abnormalities of gait and mobility (R26.89);History of falling (Z91.81)   Activity Tolerance Patient tolerated treatment well   Patient Left in bed;with call bell/phone within reach;with bed alarm set;with family/visitor present   Nurse Communication Mobility status        Time: [id number] OT Time Calculation (min): 29 min  Charges: OT General Charges $OT Visit: 1 Visit OT Treatments $Self Care/Home Management : 23-37 mins  Delbert Phenix OT OT pager: [place] 04/11/2021, 2:47 PM

## 2021-04-12 ENCOUNTER — Other Ambulatory Visit: Payer: Self-pay

## 2021-04-12 ENCOUNTER — Inpatient Hospital Stay (HOSPITAL_COMMUNITY)
Admission: RE | Admit: 2021-04-12 | Discharge: 2021-05-03 | DRG: 884 | Disposition: A | Discharge status: Skilled Nursing Facility | Payer: Medicare Other | Source: Intra-hospital | Attending: Physical Medicine & Rehabilitation | Admitting: Physical Medicine & Rehabilitation

## 2021-04-12 ENCOUNTER — Encounter (HOSPITAL_COMMUNITY): Payer: Self-pay | Admitting: Physical Medicine & Rehabilitation

## 2021-04-12 DIAGNOSIS — Z79899 Other long term (current) drug therapy: Secondary | ICD-10-CM | POA: Diagnosis not present

## 2021-04-12 DIAGNOSIS — K219 Gastro-esophageal reflux disease without esophagitis: Secondary | ICD-10-CM | POA: Diagnosis not present

## 2021-04-12 DIAGNOSIS — Z79890 Hormone replacement therapy (postmenopausal): Secondary | ICD-10-CM | POA: Diagnosis not present

## 2021-04-12 DIAGNOSIS — N390 Urinary tract infection, site not specified: Secondary | ICD-10-CM | POA: Diagnosis not present

## 2021-04-12 DIAGNOSIS — R0902 Hypoxemia: Secondary | ICD-10-CM | POA: Diagnosis not present

## 2021-04-12 DIAGNOSIS — Z8249 Family history of ischemic heart disease and other diseases of the circulatory system: Secondary | ICD-10-CM

## 2021-04-12 DIAGNOSIS — S065X0D Traumatic subdural hemorrhage without loss of consciousness, subsequent encounter: Secondary | ICD-10-CM | POA: Diagnosis not present

## 2021-04-12 DIAGNOSIS — Z87820 Personal history of traumatic brain injury: Secondary | ICD-10-CM

## 2021-04-12 DIAGNOSIS — R519 Headache, unspecified: Secondary | ICD-10-CM

## 2021-04-12 DIAGNOSIS — I951 Orthostatic hypotension: Secondary | ICD-10-CM

## 2021-04-12 DIAGNOSIS — R1312 Dysphagia, oropharyngeal phase: Secondary | ICD-10-CM | POA: Diagnosis not present

## 2021-04-12 DIAGNOSIS — Z7982 Long term (current) use of aspirin: Secondary | ICD-10-CM

## 2021-04-12 DIAGNOSIS — G8929 Other chronic pain: Secondary | ICD-10-CM | POA: Diagnosis not present

## 2021-04-12 DIAGNOSIS — D62 Acute posthemorrhagic anemia: Secondary | ICD-10-CM | POA: Diagnosis not present

## 2021-04-12 DIAGNOSIS — S065X2S Traumatic subdural hemorrhage with loss of consciousness of 31 minutes to 59 minutes, sequela: Secondary | ICD-10-CM | POA: Diagnosis not present

## 2021-04-12 DIAGNOSIS — E43 Unspecified severe protein-calorie malnutrition: Secondary | ICD-10-CM | POA: Diagnosis not present

## 2021-04-12 DIAGNOSIS — E039 Hypothyroidism, unspecified: Secondary | ICD-10-CM | POA: Diagnosis present

## 2021-04-12 DIAGNOSIS — Z7409 Other reduced mobility: Secondary | ICD-10-CM | POA: Diagnosis not present

## 2021-04-12 DIAGNOSIS — M6281 Muscle weakness (generalized): Secondary | ICD-10-CM | POA: Diagnosis not present

## 2021-04-12 DIAGNOSIS — Z83511 Family history of glaucoma: Secondary | ICD-10-CM | POA: Diagnosis not present

## 2021-04-12 DIAGNOSIS — F0281 Dementia in other diseases classified elsewhere with behavioral disturbance: Secondary | ICD-10-CM | POA: Diagnosis present

## 2021-04-12 DIAGNOSIS — S065X9A Traumatic subdural hemorrhage with loss of consciousness of unspecified duration, initial encounter: Principal | ICD-10-CM | POA: Diagnosis present

## 2021-04-12 DIAGNOSIS — H9190 Unspecified hearing loss, unspecified ear: Secondary | ICD-10-CM | POA: Diagnosis not present

## 2021-04-12 DIAGNOSIS — S065X0S Traumatic subdural hemorrhage without loss of consciousness, sequela: Secondary | ICD-10-CM | POA: Diagnosis not present

## 2021-04-12 DIAGNOSIS — Z974 Presence of external hearing-aid: Secondary | ICD-10-CM | POA: Diagnosis not present

## 2021-04-12 DIAGNOSIS — F0280 Dementia in other diseases classified elsewhere without behavioral disturbance: Secondary | ICD-10-CM | POA: Diagnosis not present

## 2021-04-12 DIAGNOSIS — W19XXXD Unspecified fall, subsequent encounter: Secondary | ICD-10-CM | POA: Diagnosis present

## 2021-04-12 DIAGNOSIS — I10 Essential (primary) hypertension: Secondary | ICD-10-CM | POA: Diagnosis present

## 2021-04-12 DIAGNOSIS — R2681 Unsteadiness on feet: Secondary | ICD-10-CM | POA: Diagnosis not present

## 2021-04-12 DIAGNOSIS — B961 Klebsiella pneumoniae [K. pneumoniae] as the cause of diseases classified elsewhere: Secondary | ICD-10-CM | POA: Diagnosis present

## 2021-04-12 DIAGNOSIS — S065X9S Traumatic subdural hemorrhage with loss of consciousness of unspecified duration, sequela: Secondary | ICD-10-CM | POA: Diagnosis not present

## 2021-04-12 DIAGNOSIS — Z741 Need for assistance with personal care: Secondary | ICD-10-CM | POA: Diagnosis not present

## 2021-04-12 DIAGNOSIS — S065X9D Traumatic subdural hemorrhage with loss of consciousness of unspecified duration, subsequent encounter: Secondary | ICD-10-CM | POA: Diagnosis not present

## 2021-04-12 DIAGNOSIS — Z90710 Acquired absence of both cervix and uterus: Secondary | ICD-10-CM

## 2021-04-12 DIAGNOSIS — Z7401 Bed confinement status: Secondary | ICD-10-CM | POA: Diagnosis not present

## 2021-04-12 DIAGNOSIS — E785 Hyperlipidemia, unspecified: Secondary | ICD-10-CM | POA: Diagnosis present

## 2021-04-12 DIAGNOSIS — B9689 Other specified bacterial agents as the cause of diseases classified elsewhere: Secondary | ICD-10-CM

## 2021-04-12 DIAGNOSIS — G309 Alzheimer's disease, unspecified: Secondary | ICD-10-CM | POA: Diagnosis not present

## 2021-04-12 DIAGNOSIS — F02818 Dementia in other diseases classified elsewhere, unspecified severity, with other behavioral disturbance: Secondary | ICD-10-CM | POA: Diagnosis present

## 2021-04-12 DIAGNOSIS — R4189 Other symptoms and signs involving cognitive functions and awareness: Principal | ICD-10-CM | POA: Diagnosis present

## 2021-04-12 DIAGNOSIS — Z20822 Contact with and (suspected) exposure to covid-19: Secondary | ICD-10-CM | POA: Diagnosis not present

## 2021-04-12 DIAGNOSIS — G301 Alzheimer's disease with late onset: Secondary | ICD-10-CM | POA: Diagnosis present

## 2021-04-12 DIAGNOSIS — S065XAA Traumatic subdural hemorrhage with loss of consciousness status unknown, initial encounter: Secondary | ICD-10-CM | POA: Diagnosis present

## 2021-04-12 DIAGNOSIS — R1013 Epigastric pain: Secondary | ICD-10-CM

## 2021-04-12 DIAGNOSIS — Z9049 Acquired absence of other specified parts of digestive tract: Secondary | ICD-10-CM

## 2021-04-12 DIAGNOSIS — K2289 Other specified disease of esophagus: Secondary | ICD-10-CM

## 2021-04-12 DIAGNOSIS — S06301A Unspecified focal traumatic brain injury with loss of consciousness of 30 minutes or less, initial encounter: Secondary | ICD-10-CM | POA: Diagnosis not present

## 2021-04-12 DIAGNOSIS — R404 Transient alteration of awareness: Secondary | ICD-10-CM | POA: Diagnosis not present

## 2021-04-12 DIAGNOSIS — R58 Hemorrhage, not elsewhere classified: Secondary | ICD-10-CM | POA: Diagnosis not present

## 2021-04-12 DIAGNOSIS — A499 Bacterial infection, unspecified: Secondary | ICD-10-CM | POA: Diagnosis not present

## 2021-04-12 HISTORY — DX: Urinary tract infection, site not specified: N39.0

## 2021-04-12 HISTORY — DX: Other specified bacterial agents as the cause of diseases classified elsewhere: B96.89

## 2021-04-12 HISTORY — DX: Personal history of other diseases of the respiratory system: Z87.09

## 2021-04-12 HISTORY — DX: Personal history of other specified conditions: Z87.898

## 2021-04-12 MED ORDER — FLEET ENEMA 7-19 GM/118ML RE ENEM: 1.0000 | ENEMA | Freq: Once | RECTAL | Status: DC | PRN

## 2021-04-12 MED ORDER — MIDODRINE HCL 5 MG PO TABS
5.0000 mg | ORAL_TABLET | Freq: Three times a day (TID) | ORAL | Status: DC
  Administered 2021-04-12 – 2021-05-03 (×62): 5 mg via ORAL
  Filled 2021-04-12 (×62): qty 1

## 2021-04-12 MED ORDER — SODIUM CHLORIDE 0.9 % IV SOLN
1.0000 g | Freq: Once | INTRAVENOUS | Status: AC
  Administered 2021-04-12: 1 g via INTRAVENOUS
  Filled 2021-04-12: qty 1

## 2021-04-12 MED ORDER — SODIUM CHLORIDE 0.9 % IV SOLN
1.0000 g | Freq: Once | INTRAVENOUS | Status: DC
  Filled 2021-04-12: qty 10

## 2021-04-12 MED ORDER — GABAPENTIN 300 MG PO CAPS
300.0000 mg | ORAL_CAPSULE | Freq: Two times a day (BID) | ORAL | Status: DC
  Administered 2021-04-12 – 2021-05-03 (×42): 300 mg via ORAL
  Filled 2021-04-12 (×42): qty 1

## 2021-04-12 MED ORDER — ACETAMINOPHEN 325 MG PO TABS: 325.0000 mg | ORAL_TABLET | ORAL | Status: DC | PRN

## 2021-04-12 MED ORDER — PROCHLORPERAZINE 25 MG RE SUPP
12.5000 mg | Freq: Four times a day (QID) | RECTAL | Status: DC | PRN
Start: 2021-04-12 — End: 2021-05-03

## 2021-04-12 MED ORDER — SERTRALINE HCL 100 MG PO TABS
100.0000 mg | ORAL_TABLET | Freq: Every day | ORAL | Status: DC
  Administered 2021-04-13 – 2021-05-03 (×21): 100 mg via ORAL
  Filled 2021-04-12 (×22): qty 1

## 2021-04-12 MED ORDER — DIPHENHYDRAMINE HCL 12.5 MG/5ML PO ELIX: 12.5000 mg | ORAL_SOLUTION | Freq: Four times a day (QID) | ORAL | Status: DC | PRN

## 2021-04-12 MED ORDER — ALUM & MAG HYDROXIDE-SIMETH 200-200-20 MG/5ML PO SUSP
30.0000 mL | ORAL | Status: DC | PRN
Start: 2021-04-12 — End: 2021-05-03

## 2021-04-12 MED ORDER — BISACODYL 10 MG RE SUPP: 10.0000 mg | Freq: Every day | RECTAL | Status: DC | PRN

## 2021-04-12 MED ORDER — CRANBERRY 1000 MG PO CAPS: 1000.0000 mg | ORAL_CAPSULE | Freq: Every day | ORAL | Status: DC

## 2021-04-12 MED ORDER — LORATADINE 10 MG PO TABS
10.0000 mg | ORAL_TABLET | Freq: Every day | ORAL | Status: DC
  Administered 2021-04-12 – 2021-05-03 (×22): 10 mg via ORAL
  Filled 2021-04-12 (×22): qty 1

## 2021-04-12 MED ORDER — PROCHLORPERAZINE MALEATE 5 MG PO TABS: 5.0000 mg | ORAL_TABLET | Freq: Four times a day (QID) | ORAL | Status: DC | PRN

## 2021-04-12 MED ORDER — FLUTICASONE PROPIONATE 50 MCG/ACT NA SUSP
2.0000 | Freq: Every day | NASAL | Status: DC
  Administered 2021-04-12 – 2021-05-03 (×21): 2 via NASAL
  Filled 2021-04-12 (×2): qty 16

## 2021-04-12 MED ORDER — LEVOTHYROXINE SODIUM 50 MCG PO TABS
50.0000 ug | ORAL_TABLET | Freq: Every day | ORAL | Status: DC
  Administered 2021-04-13 – 2021-05-03 (×21): 50 ug via ORAL
  Filled 2021-04-12 (×21): qty 1

## 2021-04-12 MED ORDER — POLYETHYLENE GLYCOL 3350 17 G PO PACK
17.0000 g | PACK | Freq: Every day | ORAL | Status: DC | PRN
  Administered 2021-04-21 – 2021-04-27 (×4): 17 g via ORAL
  Filled 2021-04-12 (×4): qty 1

## 2021-04-12 MED ORDER — MEMANTINE HCL 10 MG PO TABS
10.0000 mg | ORAL_TABLET | Freq: Two times a day (BID) | ORAL | Status: DC
  Administered 2021-04-12 – 2021-05-03 (×42): 10 mg via ORAL
  Filled 2021-04-12 (×42): qty 1

## 2021-04-12 MED ORDER — DONEPEZIL HCL 10 MG PO TABS
10.0000 mg | ORAL_TABLET | Freq: Every day | ORAL | Status: DC
  Administered 2021-04-12 – 2021-05-02 (×21): 10 mg via ORAL
  Filled 2021-04-12 (×21): qty 1

## 2021-04-12 MED ORDER — PROCHLORPERAZINE EDISYLATE 10 MG/2ML IJ SOLN: 5.0000 mg | Freq: Four times a day (QID) | INTRAMUSCULAR | Status: DC | PRN

## 2021-04-12 MED ORDER — TRAZODONE HCL 50 MG PO TABS
25.0000 mg | ORAL_TABLET | Freq: Every evening | ORAL | Status: DC | PRN
  Administered 2021-04-12: 25 mg via ORAL
  Administered 2021-05-01: 50 mg via ORAL
  Filled 2021-04-12 (×2): qty 1

## 2021-04-12 MED ORDER — CEPHALEXIN 500 MG PO CAPS: 500.0000 mg | ORAL_CAPSULE | Freq: Three times a day (TID) | ORAL | 0 refills | Status: DC

## 2021-04-12 MED ORDER — GUAIFENESIN-DM 100-10 MG/5ML PO SYRP
5.0000 mL | ORAL_SOLUTION | Freq: Four times a day (QID) | ORAL | Status: DC | PRN
  Administered 2021-04-13 – 2021-04-25 (×2): 10 mL via ORAL
  Filled 2021-04-12 (×2): qty 10

## 2021-04-12 NOTE — Progress Notes (Signed)
Inpatient Rehab Admissions Coordinator:   I received insurance authorization and have a bed for this Pt. On CIR. Today . Transport is to pickup at 1230. RN may call report to [phone].  Clemens Catholic, [place], [place] Admissions Coordinator  [phone] ([place]ina) [phone] (office)

## 2021-04-12 NOTE — Progress Notes (Addendum)
PMR Admission Coordinator Pre-Admission Assessment   Patient: Michelle [name] is an 75 y.o., female MRN: [id number] DOB: [birth year] Height: 5' 3" (160 cm) Weight: 61.4 kg   Insurance Information HMO: yes POS    PPO:      PCP:      IPA:      80/20:      OTHER: Group [id number] PRIMARY: [employer] medicare      Policy#: [id number]      Subscriber: patient CM Name: n/a     Phone#: [phone]     Fax#: [phone]    [facility] called with approval for Serita Kyle on 04/12/21 for  7 day admission 04/12/21-04/18/21. Pre-Cert#: [id number]      Employer: Retired Benefits:  Phone #: [phone]     Name: [email or web address]         Eff. Date: 09/08/20     Deduct: $0      Out of Pocket Max: $4500 (met $106.92)      Life Max: N/A CIR: $325 days 1-5      SNF: $0 days 1-20; $188 days 21-44; $0 days 45-100 Outpatient: medical necessity     Co-Pay: $30 visit copay Home Health: 100%     Co-Pay: none DME: 80%     Co-Pay: 20% Providers: in network  SECONDARY:       Policy#:      Phone#:   Development worker, community:       Phone#:   The Engineer, [employer]" for patients in Inpatient Rehabilitation Facilities with attached "Privacy Act [place] Records" was provided and verbally reviewed with: Patient and Family   Emergency Contact Information Contact Information       Name Relation Home Work [place] Spouse [phone]    [place] Daughter     [phone]    Ezekiel [place] Daughter     [phone]           Current Medical History  Patient Admitting Diagnosis: SDH/SAH   History of Present Illness: Michelle [name] is a 75 y.o. female with medical history significant for dementia with behavioral disturbance, orthostatic hypotension, seasonal allergy, and hypothyroidism, now presenting to the emergency department after falling and hitting her head.  She is accompanied by her husband who provides much of the history.  Patient had been  in her usual state of health when she fell tonight, appeared to hit her forehead on the ground, and was briefly unconscious.  She began to mumble in response to questions from family and gradually became more alert over the next 20 minutes.  She was complaining of a headache after this.  She has baseline dementia, is able to dress herself, but requires assistance from her husband with bathing and meals.  She is oriented to person only at baseline.  She has chronic headaches and was started on gabapentin for prophylaxis.   ED Course: Upon arrival to the ED, patient was found to be afebrile, saturating well on room air, and with stable blood pressure.  Chemistry panel with mild renal insufficiency and CBC with hemoglobin 11.1.  CT cervical spine negative for acute fracture or traumatic listhesis.  Head CT with subdural hemorrhage along the right frontal convexity, along the falx, and along the tentorium.  Also noted on head CT small intraparenchymal hemorrhage in the high left frontal lobe and subarachnoid hemorrhage along the sulci of the left frontal lobe and sylvian fissure.  There is no significant midline shift or mass-effect on CT.  Neurosurgery was consulted by the ED physician and the patient was treated with Tylenol. Admitted for observation.  Neurosurgery recommended no surgical intervention.  PT/OT evaluations were completed with recommendations for acute inpatient rehab admission.   Patient's medical record from [facility] has been reviewed by the rehabilitation admission coordinator and physician.   Past Medical History      Past Medical History:  Diagnosis Date   Adenomatous colon polyp     Allergy     Dementia (HCC)     GERD (gastroesophageal reflux disease)     Glaucoma     Hx of hearing loss      bilateral hearing aids   Hyperlipidemia     Hypertension     IBS (irritable bowel syndrome)     Low back pain     Thyroid disease        Family History   family history includes  Breast cancer in her maternal grandmother; Cancer in her father; Cancer (age of onset: 60) in her brother; Emphysema in her mother; Glaucoma in her mother; Hypertension in her father and mother; Macular degeneration in her father; Other in her brother; Prostate cancer in her brother and father.   Prior Rehab/Hospitalizations Has the patient had prior rehab or hospitalizations prior to admission? Yes   Has the patient had major surgery during 100 days prior to admission? No               Current Medications   Current Facility-Administered Medications:   0.9 %  sodium chloride infusion, , Intravenous, Continuous, Barb Merino, MD, Last Rate: 125 mL/hr at 04/11/21 0455, New Bag at 04/11/21 0455   acetaminophen (TYLENOL) tablet 650 mg, 650 mg, Oral, Q6H PRN **OR** acetaminophen (TYLENOL) suppository 650 mg, 650 mg, Rectal, Q6H PRN, Opyd, Ilene Qua, MD   cefTRIAXone (ROCEPHIN) 1 g in sodium chloride 0.9 % 100 mL IVPB, 1 g, Intravenous, Q24H, Ghimire, Kuber, MD, Last Rate: 200 mL/hr at 04/10/21 1625, 1 g at 04/10/21 1625   donepezil (ARICEPT) tablet 10 mg, 10 mg, Oral, QHS, Opyd, Ilene Qua, MD, 10 mg at 04/10/21 2113   gabapentin (NEURONTIN) capsule 300 mg, 300 mg, Oral, BID, Opyd, Ilene Qua, MD, 300 mg at 04/11/21 1003   haloperidol lactate (HALDOL) injection 2 mg, 2 mg, Intravenous, Q6H PRN, Opyd, Ilene Qua, MD, 2 mg at 04/09/21 0105   levothyroxine (SYNTHROID) tablet 50 mcg, 50 mcg, Oral, Q0600, Opyd, Ilene Qua, MD, 50 mcg at 04/11/21 0641   memantine (NAMENDA) tablet 10 mg, 10 mg, Oral, BID, Opyd, Ilene Qua, MD, 10 mg at 04/11/21 1004   midodrine (PROAMATINE) tablet 5 mg, 5 mg, Oral, TID WC, Opyd, Ilene Qua, MD, 5 mg at 04/11/21 1240   morphine 2 MG/ML injection 2 mg, 2 mg, Intravenous, Q3H PRN, Opyd, Ilene Qua, MD   ondansetron (ZOFRAN) tablet 4 mg, 4 mg, Oral, Q6H PRN **OR** ondansetron (ZOFRAN) injection 4 mg, 4 mg, Intravenous, Q6H PRN, Opyd, Ilene Qua, MD   senna-docusate (Senokot-S) tablet  1 tablet, 1 tablet, Oral, QHS PRN, Opyd, Ilene Qua, MD   sertraline (ZOLOFT) tablet 100 mg, 100 mg, Oral, Daily, Opyd, Ilene Qua, MD, 100 mg at 04/11/21 1004   Patients Current Diet:  Diet Order                  Diet Heart Room service appropriate? Yes; Fluid consistency: Thin  Diet effective now  Precautions / Restrictions Precautions Precautions: Fall Precaution Comments: HOH, mits Restrictions Weight Bearing Restrictions: No    Has the patient had 2 or more falls or a fall with injury in the past year? Yes   Prior Activity Level Limited Community (1-2x/wk): Went out 2-3 X a week with husband, patient did not drive.   Prior Functional Level Self Care: Did the patient need help bathing, dressing, using the toilet or eating? Needed some help   Indoor Mobility: Did the patient need assistance with walking from room to room (with or without device)? Needed some help   Stairs: Did the patient need assistance with internal or external stairs (with or without device)? Needed some help   Functional Cognition: Did the patient need help planning regular tasks such as shopping or remembering to take medications? Needed some help   Home Assistive Devices / [place] Devices/Equipment: Cane (specify quad or straight), Eyeglasses, Walker (specify type), Other (Comment) (tub/shower unit, walk-in shower, front wheeled walker, single point cane, handicap height toilet) Home Equipment: Walker - 2 wheels   Prior Device Use: Indicate devices/aids used by the patient prior to current illness, exacerbation or injury? None of the above   Current Functional Level Cognition   Overall Cognitive Status: History of cognitive impairments - at baseline Difficult to assess due to: Level of arousal Orientation Level: Oriented to person, Disoriented to place, Disoriented to time, Disoriented to situation General Comments: spouse reporting patient more awake  throughout the day today, was easier to arouse this OT session. following directions with increased time    Extremity Assessment (includes Sensation/Coordination)   Upper Extremity Assessment: Defer to OT evaluation  Lower Extremity Assessment: Difficult to assess due to impaired cognition (Pt not following MMT commands.  Does demonstrate ROM WFL and at least 4/5 strength throughout (keeping legs extended in sitting and therapist providing resistance to get feet on floor, not able to formally MMT))     ADLs   Overall ADL's : Needs assistance/impaired Grooming: Supervision/safety, Set up, Sitting Upper Body Bathing: Minimal assistance, Sitting Lower Body Bathing: Moderate assistance, Sit to/from stand, Sitting/lateral leans Upper Body Dressing : Minimal assistance, Sitting Lower Body Dressing: Total assistance, Sitting/lateral leans Lower Body Dressing Details (indicate cue type and reason): to don socks, spouse reports he helps her at baseline "sometimes I'm short on patience" Toilet Transfer: Minimal assistance, +2 for physical assistance, +2 for safety/equipment, Ambulation, Regular Toilet Toilet Transfer Details (indicate cue type and reason): hand held assistance x2, max cues to ambulate taking small shuffled steps to bathroom. increased carry over of cues this session to initiate sitting onto toilet Toileting- Clothing Manipulation and Hygiene: Total assistance, Sit to/from stand Toileting - Clothing Manipulation Details (indicate cue type and reason): patient incontinent of stool in bed and had small bowel movement on toilet, reliant on Ue support and limited cognition therefore total A for peri care Functional mobility during ADLs: Minimal assistance, +2 for physical assistance, +2 for safety/equipment, Cueing for safety, Cueing for sequencing General ADL Comments: patient still below her baseline with functional ambulation, decreased arousal needing mod to max cues/stimulation to  participate in self care tasks.     Mobility   Overal bed mobility: Needs Assistance Bed Mobility: Supine to Sit, Sit to Supine Supine to sit: Mod assist, +2 for physical assistance Sit to supine: Min assist, +2 for physical assistance General bed mobility comments: limited initiation to sit up at edge of bed     Transfers   Overall transfer  level: Needs assistance Equipment used: 2 person hand held assist Transfers: Sit to/from Stand Sit to Stand: Min assist, +2 physical assistance, +2 safety/equipment, Mod assist General transfer comment: please see toilet transfer in ADL section     Ambulation / Gait / Stairs / Wheelchair Mobility   Ambulation/Gait Ambulation/Gait assistance: Mod assist, +2 safety/equipment, +2 physical assistance Gait Distance (Feet): 50 Feet Assistive device: 2 person hand held assist Gait Pattern/deviations: Step-to pattern, Decreased stride length, Shuffle, Decreased dorsiflexion - right, Decreased dorsiflexion - left General Gait Details: Pt with shuffle gait and requiring HHA to steady and lead direction.  She did turn with cues.  Very short shuffle steps - cues and assist to weight shift to increase stride length - had 1 episode that was able to improve step length but mostly shuffled Gait velocity: decreased     Posture / Balance Dynamic Sitting Balance Sitting balance - Comments: patient leans posteriorly and keeps legs crossed with feet not touching the floor Balance Overall balance assessment: History of Falls, Needs assistance Sitting-balance support: Feet unsupported Sitting balance-Leahy Scale: Poor Sitting balance - Comments: patient leans posteriorly and keeps legs crossed with feet not touching the floor Standing balance support: Bilateral upper extremity supported Standing balance-Leahy Scale: Poor Standing balance comment: HHA x2     Special needs/care consideration Continuous Drip IV  NS 125 ml/hr and Behavioral consideration Has dementia     Previous Home Environment (from acute therapy documentation) Living Arrangements: Spouse/significant other Available Help at Discharge: Family, Available 24 hours/day Type of Home: House Home Layout: One level Home Access: Stairs to enter Entrance Stairs-Rails: Right, Left Entrance Stairs-Number of Steps: 5 Bathroom Shower/Tub: Multimedia programmer: Handicapped height Home Care Services: No   Discharge Living Setting Plans for Discharge Living Setting: Patient's home, House, Lives with (comment) (Lives with husband) Type of Home at Discharge: House Discharge Home Layout: One level Discharge Home Access: Stairs to enter Entrance Stairs-Rails: Right, Left, Can reach both Entrance Stairs-Number of Steps: 5 at [place] entrance Discharge Bathroom Shower/Tub: Walk-in shower, Door Discharge Bathroom Toilet: Handicapped height Discharge Bathroom Accessibility: No Does the patient have any problems obtaining your medications?: No   Social/Family/Support Systems Patient Roles: Spouse, Parent (Has husband, dtr and Gson.) Contact Information: Suszanne Conners - husband Anticipated Caregiver: Husband Anticipated Caregiver's Contact Information: Manahil Vanzile - husband - (c) [phone] Ability/Limitations of Caregiver: Husband can provide supervision and care as he has done for 6 years. Caregiver Availability: 24/7 Discharge Plan Discussed with Primary Caregiver: Yes Is Caregiver In Agreement with Plan?: Yes Does Caregiver/Family have Issues with Lodging/Transportation while Pt is in Rehab?: No   Goals Patient/Family Goal for Rehab: PT/OT/SLP supervision to min assist goals Expected length of stay: 12-14 days Cultural Considerations: None Pt/Family Agrees to Admission and willing to participate: Yes Program Orientation Provided & Reviewed with Pt/Caregiver Including Roles  & Responsibilities: Yes   Decrease burden of Care through IP rehab admission: N/A   Possible need  for SNF placement upon discharge: Not planned   Patient Condition: I have reviewed medical records from [facility], spoken with CM, and spouse. I discussed via phone for inpatient rehabilitation assessment.  Patient will benefit from ongoing PT and OT, can actively participate in 3 hours of therapy a day 5 days of the week, and can make measurable gains during the admission.  Patient will also benefit from the coordinated team approach during an Inpatient Acute Rehabilitation admission.  The patient will receive intensive therapy as well as Rehabilitation  physician, nursing, social worker, and care management interventions.  Due to bladder management, bowel management, safety, skin/wound care, disease management, medication administration, pain management, and patient education the patient requires 24 hour a day rehabilitation nursing.  The patient is currently min to mod assist with mobility and basic ADLs.  Discharge setting and therapy post discharge at home with home health is anticipated.  Patient has agreed to participate in the Acute Inpatient Rehabilitation Program and will admit today.   Preadmission Screen Completed By:  Retta Diones, 04/11/2021 4:03 PM with updates by Clemens Catholic MS CCC-SLP ______________________________________________________________________   Discussed status with Dr. Naaman Plummer on 04/09/21 at 14 and received approval for admission today.   Admission Coordinator:  Retta Diones, RN, time 1130/Date 04/12/21    Assessment/Plan: Diagnosis: traumatic SDH/SAH Does the need for close, 24 hr/day Medical supervision in concert with the patient's rehab needs make it unreasonable for this patient to be served in a less intensive setting? Yes Co-Morbidities requiring supervision/potential complications: dementia, orthostasis Due to bladder management, bowel management, safety, skin/wound care, disease management, medication administration, pain management, and patient education, does  the patient require 24 hr/day rehab nursing? Yes Does the patient require coordinated care of a physician, rehab nurse, PT, OT, and SLP to address physical and functional deficits in the context of the above medical diagnosis(es)? Yes Addressing deficits in the following areas: balance, endurance, locomotion, strength, transferring, bowel/bladder control, bathing, dressing, feeding, grooming, toileting, cognition, and psychosocial support Can the patient actively participate in an intensive therapy program of at least 3 hrs of therapy 5 days a week? Yes The potential for patient to make measurable gains while on inpatient rehab is excellent Anticipated functional outcomes upon discharge from inpatient rehab: supervision and min assist PT, supervision and min assist OT, supervision and min assist SLP supervision to min assist Estimated rehab length of stay to reach the above functional goals is: 12-14 days Anticipated discharge destination: Home 10. Overall Rehab/Functional Prognosis: good     MD Signature: Meredith Staggers, MD, [facility] 04/12/2021

## 2021-04-12 NOTE — Progress Notes (Signed)
Inpatient Rehabilitation  Patient information reviewed and entered into eRehab system by Jazmine Heckman M. Akiyah Eppolito, M.A., CCC/SLP, PPS Coordinator.  Information including medical coding, functional ability and quality indicators will be reviewed and updated through discharge.    

## 2021-04-12 NOTE — Discharge Summary (Signed)
Physician Discharge Summary  Michelle [name] [id number] DOB: [birth year] DOA: 04/08/2021  PCP: Ann Held, DO  Admit date: 04/08/2021 Discharge date: 04/12/2021  Admitted From: Home Disposition: Acute inpatient Rehab   Recommendations for Outpatient Follow-up:  Follow up with PCP in 1-2 weeks after discharge Encourage oral intake  Home Health: Not applicable Equipment/Devices: Not applicable  Discharge Condition: Stable fair CODE STATUS: DNR Diet recommendation: Regular diet.  Encourage nutrition and more supervised feeding.  Discharge summary: 75 year old female with history of dementia with behavioral disturbances, orthostatic hypotension, hypothyroidism presented to the emergency department after falling and hitting her head.  She lives at home with her husband.  Has progressively dementia.  Patient was noted to be unresponsive for few minutes and mumbling even after waking up.  Complained of headache.  On arrival to emergency room, she was afebrile, hemodynamically hemodynamically stable.  CT C-spine negative.  CT head with small subdural hemorrhage of the right frontal convexity, multiple parenchymal hemorrhage with no mass-effect.  Admitted to  observation.  Neurosurgery recommended no surgical intervention. Admitted due to significant problems and altered mental status.   Assessment & Plan of care:    # Traumatic intracranial hemorrhage: CT scan consistent with multiple area of parenchymal hemorrhage, 4 mm subdural hematoma with no mass-effect.  Neurosurgery recommended conservative management.  Discontinue aspirin and other blood thinners. Work with PT OT and recommended rehab. Patient has a history of meningioma, a repeat MRI was done that shows stable appearing meningioma and extensive parenchymal hemorrhage as above. Symptomatic treatment advised.  Avoid all aspirin and related products.   # Acute metabolic encephalopathy in a patient with Alzheimer's disease with  behavioral disturbances: Urinalysis consistent with UTI, Rocephin day 3 today.  Urine culture growing gram-negative bacteremia.  Previous E. coli sensitive to Rocephin.   Will treat patient with total 7 days of antibiotics.  Keflex for 4 more days on discharge starting tomorrow morning. Fall precautions.  Delirium precautions. Patient is on dementia treatment donepezil and Namenda.  #Orthostatic hypotension: Stable on midodrine.  # Hypothyroidism: Replaced on Synthroid.  Is recovering from acute illness.  She will benefit with ongoing aggressive rehab.  Is stable to transfer to acute inpatient level of care.     Discharge Diagnoses:  Principal Problem:   Intracranial hemorrhage following injury [facility]) Active Problems:   Hypothyroidism   Late onset Alzheimer's dementia with behavioral disturbance ([place])   Orthostatic hypotension   Intracranial hemorrhage [facility])    Discharge Instructions  Discharge Instructions     Diet general   Complete by: As directed    Increase activity slowly   Complete by: As directed       Allergies as of 04/12/2021   No Known Allergies      Medication List     STOP taking these medications    aspirin 81 MG tablet   nitrofurantoin 50 MG capsule Commonly known as: MACRODANTIN       TAKE these medications    AMBULATORY NON FORMULARY MEDICATION Take 1 tablet by mouth daily. IBgard: Takes prn   CALCIUM PO Take 1 tablet by mouth 2 (two) times daily.   cephALEXin 500 MG capsule Commonly known as: KEFLEX Take 1 capsule (500 mg total) by mouth 3 (three) times daily for 4 days. Start taking on: April 13, 2021   Cranberry 1000 MG Caps Take 1,000 mg by mouth daily.   CULTURELLE DIGESTIVE DAILY PO Take 1 tablet by mouth daily.   donepezil 10 MG tablet  Commonly known as: ARICEPT Take 1 tablet daily   fluticasone 50 MCG/ACT nasal spray Commonly known as: FLONASE Place 2 sprays into both nostrils daily. What changed:  when to take  this reasons to take this   gabapentin 300 MG capsule Commonly known as: NEURONTIN Take 1 cap twice a day   levothyroxine 50 MCG tablet Commonly known as: SYNTHROID TAKE 1 TABLET BY MOUTH EVERY DAY BEFORE BREAKFAST   loratadine 10 MG tablet Commonly known as: CLARITIN TAKE 1 TABLET BY MOUTH EVERY DAY   memantine 10 MG tablet Commonly known as: Namenda Take 1 tablet (10 mg total) by mouth 2 (two) times daily.   midodrine 5 MG tablet Commonly known as: PROAMATINE TAKE 1 TABLET (5 MG TOTAL) BY MOUTH 3 (THREE) TIMES DAILY WITH MEALS.   multivitamin tablet Take 1 tablet by mouth daily.   sertraline 100 MG tablet Commonly known as: Zoloft Take 1 tablet (100 mg total) by mouth daily.   VITAMIN D PO Take 1,000 Units by mouth daily.        Follow-up Information     Ann Held, DO Follow up in 2 week(s).   Specialty: Family Medicine Contact information: [address] [place] [address] [phone]                No Known Allergies  Consultations: Neurosurgery   Procedures/Studies: DG Ribs Unilateral W/Chest Left  Result Date: 04/09/2021 CLINICAL DATA:  Fall EXAM: LEFT RIBS AND CHEST - 3+ VIEW COMPARISON:  Chest radiograph 02/07/2020 FINDINGS: There is no evidence of displaced rib fracture. Unchanged cardiac silhouette. No focal airspace disease. No pleural effusion or pneumothorax. Bilateral shoulder degenerative changes. Thoracic spondylosis. IMPRESSION: No evidence of displaced rib fracture. Electronically Signed   By: Maurine Simmering   On: 04/09/2021 17:38   CT HEAD WO CONTRAST (5MM)  Result Date: 04/08/2021 CLINICAL DATA:  Fall, left head strike EXAM: CT HEAD WITHOUT CONTRAST CT CERVICAL SPINE WITHOUT CONTRAST TECHNIQUE: Multidetector CT imaging of the head and cervical spine was performed following the standard protocol without intravenous contrast. Multiplanar CT image reconstructions of the cervical spine were also generated.  COMPARISON:  None. FINDINGS: CT HEAD FINDINGS Brain: Multiple sites of hyperdense hemorrhage including: Subdural hemorrhage along the right frontal convexity measuring 4 mm in maximal thickness (6/23). Hyperdense subdural hemorrhage seen along the falx measuring up to 6 mm in maximal thickness Hyperdense subdural hemorrhage along the tentorium as well. Small intraparenchymal hemorrhage noted in the high left frontal lobe (3/25). Subarachnoid hemorrhage along the sulci of the left frontal lobe along the sylvian fissure (3/15). No significant midline shift or resulting mass effect. No CT evident large vascular territory or cortically based infarct. No focal lesion. Vascular: Atherosclerotic calcification of the carotid siphons. No hyperdense vessel. Skull: Minimal left frontal scalp thickening. No large hematoma. No calvarial fracture. No other acute osseous abnormality. Sinuses/Orbits: Paranasal sinuses and mastoid air cells are predominantly clear. Orbital structures are unremarkable aside from prior lens extractions. Other: None CT CERVICAL SPINE FINDINGS Alignment: Cervical stabilization collar absent the time exam. No evidence of traumatic listhesis. No abnormally widened, perched or jumped facets. Normal alignment of the craniocervical and atlantoaxial articulations. Skull base and vertebrae: Motion degraded imaging may limit detection of subtle abnormalities. Bony fusion across the left C3-4 facets. Multilevel cervical spondylitic changes further detailed below. Additional arthrosis at the atlantodental and basion dens intervals. Soft tissues and spinal canal: No pre or paravertebral fluid or swelling. No  visible canal hematoma. 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 Airways patent. No acute traumatic soft tissue abnormality of the neck. No suspicious adenopathy. Disc levels: Multilevel intervertebral disc height loss with spondylitic endplate changes. Ossification of posterior  longitudinal ligament C5-C7 resulting in some mild to moderate canal stenosis. Additional disc osteophyte complexes partially effacing the ventral thecal sac without significant canal impingement. Multilevel uncinate spurring facet hypertrophic changes resulting in some mild to moderate multilevel neural foraminal narrowing most pronounced C4-5 right changes are more moderate to severe. Upper chest: No acute abnormality in the upper chest or imaged lung apices. Other: Normal thyroid. IMPRESSION: Left frontal scalp swelling without calvarial fracture. Sites of intracranial hemorrhage including subdural hemorrhage along the right frontal convexity and in a para falcine and tentorial distribution, left frontal intraparenchymal hemorrhage and left frontal subarachnoid hemorrhage. No significant resulting mass effect or midline shift. Imaging cervical spine imaging degraded by motion artifact. No acute cervical spine fracture or traumatic listhesis. Cervical spondylitic changes, as detailed above. 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 These results were called by telephone at the time of interpretation on 04/08/2021 at 9:22 pm to provider ADAM CURATOLO , who verbally acknowledged these results. Electronically Signed   By: Lovena Le M.D.   On: 04/08/2021 21:22   CT Cervical Spine Wo Contrast  Result Date: 04/08/2021 CLINICAL DATA:  Fall, left head strike EXAM: CT HEAD WITHOUT CONTRAST CT CERVICAL SPINE WITHOUT CONTRAST TECHNIQUE: Multidetector CT imaging of the head and cervical spine was performed following the standard protocol without intravenous contrast. Multiplanar CT image reconstructions of the cervical spine were also generated. COMPARISON:  None. FINDINGS: CT HEAD FINDINGS Brain: Multiple sites of hyperdense hemorrhage including: Subdural hemorrhage along the right frontal convexity measuring 4 mm in maximal thickness (6/23). Hyperdense subdural hemorrhage seen along the falx  measuring up to 6 mm in maximal thickness Hyperdense subdural hemorrhage along the tentorium as well. Small intraparenchymal hemorrhage noted in the high left frontal lobe (3/25). Subarachnoid hemorrhage along the sulci of the left frontal lobe along the sylvian fissure (3/15). No significant midline shift or resulting mass effect. No CT evident large vascular territory or cortically based infarct. No focal lesion. Vascular: Atherosclerotic calcification of the carotid siphons. No hyperdense vessel. Skull: Minimal left frontal scalp thickening. No large hematoma. No calvarial fracture. No other acute osseous abnormality. Sinuses/Orbits: Paranasal sinuses and mastoid air cells are predominantly clear. Orbital structures are unremarkable aside from prior lens extractions. Other: None CT CERVICAL SPINE FINDINGS Alignment: Cervical stabilization collar absent the time exam. No evidence of traumatic listhesis. No abnormally widened, perched or jumped facets. Normal alignment of the craniocervical and atlantoaxial articulations. Skull base and vertebrae: Motion degraded imaging may limit detection of subtle abnormalities. Bony fusion across the left C3-4 facets. Multilevel cervical spondylitic changes further detailed below. Additional arthrosis at the atlantodental and basion dens intervals. Soft tissues and spinal canal: No pre or paravertebral fluid or swelling. No visible canal hematoma. 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 Airways patent. No acute traumatic soft tissue abnormality of the neck. No suspicious adenopathy. Disc levels: Multilevel intervertebral disc height loss with spondylitic endplate changes. Ossification of posterior longitudinal ligament C5-C7 resulting in some mild to moderate canal stenosis. Additional disc osteophyte complexes partially effacing the ventral thecal sac without significant canal impingement. Multilevel uncinate spurring facet hypertrophic changes  resulting in some mild to moderate multilevel neural foraminal narrowing most pronounced C4-5 right changes are more moderate to severe. Upper  chest: No acute abnormality in the upper chest or imaged lung apices. Other: Normal thyroid. IMPRESSION: Left frontal scalp swelling without calvarial fracture. Sites of intracranial hemorrhage including subdural hemorrhage along the right frontal convexity and in a para falcine and tentorial distribution, left frontal intraparenchymal hemorrhage and left frontal subarachnoid hemorrhage. No significant resulting mass effect or midline shift. Imaging cervical spine imaging degraded by motion artifact. No acute cervical spine fracture or traumatic listhesis. Cervical spondylitic changes, as detailed above. 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 These results were called by telephone at the time of interpretation on 04/08/2021 at 9:22 pm to provider ADAM CURATOLO , who verbally acknowledged these results. Electronically Signed   By: Lovena Le M.D.   On: 04/08/2021 21:22   MR BRAIN WO CONTRAST  Result Date: 04/10/2021 CLINICAL DATA:  Mental status change, persistent or worsening; history of meningioma. Additional history provided: Recent fall, advanced dementia, increased confusion, possible CVA. EXAM: MRI HEAD WITHOUT CONTRAST TECHNIQUE: Multiplanar, multiecho pulse sequences of the brain and surrounding structures were obtained without intravenous contrast. COMPARISON:  Prior head CT examinations 04/08/2021 and earlier. Brain MRI 05/06/2019. FINDINGS: Brain: Mild intermittent motion degradation. Moderate generalized cerebral atrophy. Comparatively mild cerebellar atrophy. Multiple hemorrhagic parenchymal contusions within the left frontal lobe measuring up to 11 mm. Redemonstrated small focus of acute hemorrhage within the septum pellucidum. Redemonstrated acute subdural hematoma along the falx and right cerebral hemisphere measuring up to 4  mm in greatest thickness. Subarachnoid hemorrhage along the left frontal lobe (along the anterior aspect of the left sylvian fissure). Small volume hemorrhage within the lateral ventricles has slightly increased in volume. No evidence of hydrocephalus at this time. Apparent cortical restricted diffusion within the left frontal lobe (along the anterior aspect of the left sylvian fissure), which may reflect artifact from subarachnoid blood products at this site, acute infarct or parenchymal contusion. Redemonstrated dural-based mass along the dorsal aspect of the clivus, eccentric to the left, compatible with a known meningioma. This mass has not significantly changed in size as compared to the brain MRI of 05/06/2019, measuring 0.9 x 2.2 x 2.5 cm (AP x TV x CC) on today's study. Mild multifocal T2/FLAIR hyperintensity within the cerebral white matter, nonspecific but compatible with chronic small vessel ischemic disease. No midline shift. Vascular: Expected proximal arterial flow voids. Skull and upper cervical spine: No focal marrow lesion. Sinuses/Orbits: Visualized orbits show no acute finding. Bilateral lens replacements. Small right maxillary sinus mucous retention cyst. IMPRESSION: Intermittently motion degraded examination. Multiple acute hemorrhagic parenchymal contusions within the left frontal lobe measuring up to 11 mm. Redemonstrated small focus of acute hemorrhage within the septum pellucidum. Unchanged volume of acute subdural hemorrhage along the falx and right cerebral hemisphere, measuring up to 4 mm in greatest thickness. Grossly unchanged acute subarachnoid hemorrhage along the left frontal lobe (along the anterior aspect of the left sylvian fissure). Small-volume hemorrhage within the lateral ventricles has slightly increased. No evidence of hydrocephalus at this time. Apparent cortical restricted diffusion within the left frontal lobe (along the anterior aspect of the left sylvian fissure), which  may reflect artifact from adjacent subarachnoid hemorrhage, acute infarct or parenchymal contusion. 2.2 x 2.5 cm meningioma along the dorsal aspect of the clivus, not significantly changed in size from the brain MRI of 05/06/2019. Parenchymal atrophy and mild chronic small vessel ischemic disease. Electronically Signed   By: Kellie Simmering DO   On: 04/10/2021 12:40   CT MAXILLOFACIAL WO CONTRAST  Result Date: 04/09/2021  CLINICAL DATA:  Fall EXAM: CT MAXILLOFACIAL WITHOUT CONTRAST TECHNIQUE: Multidetector CT imaging of the maxillofacial structures was performed. Multiplanar CT image reconstructions were also generated. COMPARISON:  Head CT 04/08/2021 FINDINGS: Osseous: No fracture or mandibular dislocation. No destructive process. Orbits: Negative. No traumatic or inflammatory finding. Sinuses: Clear. Soft tissues: Negative. Limited intracranial: Unchanged pattern intracranial hemorrhage IMPRESSION: 1. No facial fracture. 2. Unchanged pattern of intracranial hemorrhage. Electronically Signed   By: Ulyses Jarred M.D.   On: 04/09/2021 21:21   (Echo, Carotid, EGD, Colonoscopy, ERCP)    Subjective: Patient seen and examined.  Awake and trying to eat.  No overnight events.  Unable to express anything.   Discharge Exam: Vitals:   04/11/21 2055 04/12/21 0506  BP: 113/60 140/60  Pulse: 64 (!) 54  Resp: 16 16  Temp: 98.2 F (36.8 C) 97.8 F (36.6 C)  SpO2: 98%    Vitals:   04/11/21 0359 04/11/21 1418 04/11/21 2055 04/12/21 0506  BP: 121/63 131/62 113/60 140/60  Pulse: (!) 59 62 64 (!) 54  Resp: '15 18 16 16  '$ Temp: 98 F (36.7 C) 98.8 F (37.1 C) 98.2 F (36.8 C) 97.8 F (36.6 C)  TempSrc: Oral Oral Oral Oral  SpO2: 95% 99% 98%   Weight:      Height:        General: Pt is alert, awake, not in acute distress She is oriented to self.  Not oriented to time and place. Cardiovascular: RRR, S1/S2 +, no rubs, no gallops Respiratory: CTA bilaterally, no wheezing, no rhonchi Abdominal: Soft, NT,  ND, bowel sounds + Extremities: no edema, no cyanosis    The results of significant diagnostics from this hospitalization (including imaging, microbiology, ancillary and laboratory) are listed below for reference.     Microbiology: Recent Results (from the past 240 hour(s))  Resp Panel by RT-PCR (Flu A&B, Covid) Nasopharyngeal Swab     Status: None   Collection Time: 04/08/21  9:42 PM   Specimen: Nasopharyngeal Swab; Nasopharyngeal(NP) swabs in vial transport medium  Result Value Ref Range Status   SARS Coronavirus 2 by RT PCR NEGATIVE NEGATIVE Final    Comment: (NOTE) SARS-CoV-2 target nucleic acids are NOT DETECTED.  The SARS-CoV-2 RNA is generally detectable in upper respiratory specimens during the acute phase of infection. The lowest concentration of SARS-CoV-2 viral copies this assay can detect is 138 copies/mL. A negative result does not preclude SARS-Cov-2 infection and should not be used as the sole basis for treatment or other patient management decisions. A negative result may occur with  improper specimen collection/handling, submission of specimen other than nasopharyngeal swab, presence of viral mutation(s) within the areas targeted by this assay, and inadequate number of viral copies(<138 copies/mL). A negative result must be combined with clinical observations, patient history, and epidemiological information. The expected result is Negative.  Fact Sheet for Patients:  EntrepreneurPulse.com.au  Fact Sheet for Healthcare Providers:  IncredibleEmployment.be  This test is no t yet approved or cleared by the [place] FDA and  has been authorized for detection and/or diagnosis of SARS-CoV-2 by FDA under an Emergency Use Authorization (EUA). This EUA will remain  in effect (meaning this test can be used) for the duration of the COVID-19 declaration under Section 564(b)(1) of the Act, 21 U.S.C.section 360bbb-3(b)(1), unless  the authorization is terminated  or revoked sooner.       Influenza A by PCR NEGATIVE NEGATIVE Final   Influenza B by PCR NEGATIVE NEGATIVE Final    Comment: (  NOTE) The Xpert Xpress SARS-CoV-2/FLU/RSV plus assay is intended as an aid in the diagnosis of influenza from Nasopharyngeal swab specimens and should not be used as a sole basis for treatment. Nasal washings and aspirates are unacceptable for Xpert Xpress SARS-CoV-2/FLU/RSV testing.  Fact Sheet for Patients: EntrepreneurPulse.com.au  Fact Sheet for Healthcare Providers: IncredibleEmployment.be  This test is not yet approved or cleared by the [place] FDA and has been authorized for detection and/or diagnosis of SARS-CoV-2 by FDA under an Emergency Use Authorization (EUA). This EUA will remain in effect (meaning this test can be used) for the duration of the COVID-19 declaration under Section 564(b)(1) of the Act, 21 U.S.C. section 360bbb-3(b)(1), unless the authorization is terminated or revoked.  Performed at [facility], [place] [address], [place], [place] [address]   Urine Culture     Status: Abnormal (Preliminary result)   Collection Time: 04/10/21 11:05 AM   Specimen: Urine, Clean Catch  Result Value Ref Range Status   Specimen Description   Final    URINE, CLEAN CATCH Performed at [facility], [place] [address] [place], [place] [address]    Special Requests   Final    NONE Performed at [facility], [place] [address] [place], [place] [address]    Culture >=100,000 COLONIES/mL GRAM NEGATIVE RODS (A)  Final   Report Status PENDING  Incomplete     Labs: BNP (last 3 results) No results for input(s): BNP in the last 8760 hours. Basic Metabolic Panel: Recent Labs  Lab 04/08/21 2142 04/09/21 0500  NA 139 143  K 4.0 4.2  CL 103 110  CO2 27 27  GLUCOSE 119* 116*  BUN 20 16  CREATININE 1.06* 0.95  CALCIUM 8.9  8.8*   Liver Function Tests: No results for input(s): AST, ALT, ALKPHOS, BILITOT, PROT, ALBUMIN in the last 168 hours. No results for input(s): LIPASE, AMYLASE in the last 168 hours. No results for input(s): AMMONIA in the last 168 hours. CBC: Recent Labs  Lab 04/08/21 2142 04/09/21 0500  WBC 8.6 10.0  NEUTROABS 7.1  --   HGB 11.1* 10.3*  HCT 34.1* 31.5*  MCV 89.5 89.5  PLT 241 210   Cardiac Enzymes: No results for input(s): CKTOTAL, CKMB, CKMBINDEX, TROPONINI in the last 168 hours. BNP: Invalid input(s): POCBNP CBG: No results for input(s): GLUCAP in the last 168 hours. D-Dimer No results for input(s): DDIMER in the last 72 hours. Hgb A1c No results for input(s): HGBA1C in the last 72 hours. Lipid Profile No results for input(s): CHOL, HDL, LDLCALC, TRIG, CHOLHDL, LDLDIRECT in the last 72 hours. Thyroid function studies No results for input(s): TSH, T4TOTAL, T3FREE, THYROIDAB in the last 72 hours.  Invalid input(s): FREET3 Anemia work up No results for input(s): VITAMINB12, FOLATE, FERRITIN, TIBC, IRON, RETICCTPCT in the last 72 hours. Urinalysis    Component Value Date/Time   COLORURINE YELLOW 04/10/2021 1105   APPEARANCEUR CLOUDY (A) 04/10/2021 1105   LABSPEC 1.011 04/10/2021 1105   PHURINE 6.0 04/10/2021 1105   GLUCOSEU NEGATIVE 04/10/2021 1105   GLUCOSEU NEGATIVE 10/06/2016 1610   HGBUR SMALL (A) 04/10/2021 1105   HGBUR negative 09/12/2010 0820   BILIRUBINUR NEGATIVE 04/10/2021 1105   BILIRUBINUR Negative 02/17/2020 1014   KETONESUR 5 (A) 04/10/2021 1105   PROTEINUR NEGATIVE 04/10/2021 1105   UROBILINOGEN 0.2 02/17/2020 1014   UROBILINOGEN 0.2 10/06/2016 1610   NITRITE NEGATIVE 04/10/2021 1105   LEUKOCYTESUR LARGE (A) 04/10/2021 1105   Sepsis Labs Invalid  input(s): PROCALCITONIN,  WBC,  LACTICIDVEN Microbiology Recent Results (from the past 240 hour(s))  Resp Panel by RT-PCR (Flu A&B, Covid) Nasopharyngeal Swab     Status: None   Collection Time:  04/08/21  9:42 PM   Specimen: Nasopharyngeal Swab; Nasopharyngeal(NP) swabs in vial transport medium  Result Value Ref Range Status   SARS Coronavirus 2 by RT PCR NEGATIVE NEGATIVE Final    Comment: (NOTE) SARS-CoV-2 target nucleic acids are NOT DETECTED.  The SARS-CoV-2 RNA is generally detectable in upper respiratory specimens during the acute phase of infection. The lowest concentration of SARS-CoV-2 viral copies this assay can detect is 138 copies/mL. A negative result does not preclude SARS-Cov-2 infection and should not be used as the sole basis for treatment or other patient management decisions. A negative result may occur with  improper specimen collection/handling, submission of specimen other than nasopharyngeal swab, presence of viral mutation(s) within the areas targeted by this assay, and inadequate number of viral copies(<138 copies/mL). A negative result must be combined with clinical observations, patient history, and epidemiological information. The expected result is Negative.  Fact Sheet for Patients:  EntrepreneurPulse.com.au  Fact Sheet for Healthcare Providers:  IncredibleEmployment.be  This test is no t yet approved or cleared by the [place] FDA and  has been authorized for detection and/or diagnosis of SARS-CoV-2 by FDA under an Emergency Use Authorization (EUA). This EUA will remain  in effect (meaning this test can be used) for the duration of the COVID-19 declaration under Section 564(b)(1) of the Act, 21 U.S.C.section 360bbb-3(b)(1), unless the authorization is terminated  or revoked sooner.       Influenza A by PCR NEGATIVE NEGATIVE Final   Influenza B by PCR NEGATIVE NEGATIVE Final    Comment: (NOTE) The Xpert Xpress SARS-CoV-2/FLU/RSV plus assay is intended as an aid in the diagnosis of influenza from Nasopharyngeal swab specimens and should not be used as a sole basis for treatment. Nasal washings  and aspirates are unacceptable for Xpert Xpress SARS-CoV-2/FLU/RSV testing.  Fact Sheet for Patients: EntrepreneurPulse.com.au  Fact Sheet for Healthcare Providers: IncredibleEmployment.be  This test is not yet approved or cleared by the [place] FDA and has been authorized for detection and/or diagnosis of SARS-CoV-2 by FDA under an Emergency Use Authorization (EUA). This EUA will remain in effect (meaning this test can be used) for the duration of the COVID-19 declaration under Section 564(b)(1) of the Act, 21 U.S.C. section 360bbb-3(b)(1), unless the authorization is terminated or revoked.  Performed at [facility] [place] [address] [place], [place] [address]   Urine Culture     Status: Abnormal (Preliminary result)   Collection Time: 04/10/21 11:05 AM   Specimen: Urine, Clean Catch  Result Value Ref Range Status   Specimen Description   Final    URINE, CLEAN CATCH Performed at [facility], [place], [place], [place] [address]    Special Requests   Final    NONE Performed at [facility], [place] [address] [place], [place] [address]    Culture >=100,000 COLONIES/mL Lonell Grandchild NEGATIVE RODS (A)  Final   Report Status PENDING  Incomplete     Time coordinating discharge:  32 minutes  SIGNED:   Barb Merino, MD  [facility] 04/12/2021, 11:13 AM

## 2021-04-12 NOTE — H&P (Signed)
Physical Medicine and Rehabilitation Admission H&P        Chief Complaint  Patient presents with   TBI with functional deficits.       HPI: Michelle [name] is a 75 year old RH-female with history of HTN, hearing loss, idiopathic hypotension, chronic UTIs, Alzheimer's dementia with behavioral disturbance/hallucinations (followed by Dr. Delice Lesch) who was admitted to [facility] on 04/08/2021 after falling and striking her forehead followed by brief loss of consciousness.  She was confused with mumbling responses and had complaints of headache.  CT head done showing left frontal scalp swelling with left frontal SAH, left frontal IPH and SDH left frontal convexity and parafalcine and tentorial distribution.  Dr. Christella Noa consulted for input and felt no surgical intervention needed and to repeat scan as needed neurological changes.   She did have worsening of mental status on 08/03 and MRI brain done showing no change in acute subdural hemorrhage and multiple acute hemorrhagic parenchymal contusions left frontal lobe measuring up to 11 mm.  Urine culture also done showing greater than 100,000 colonies of gram-negative rods and she was started on Rocephin for treatment.  Mentation is improving with patient showing increased ability to follow commands however continues to have delayed processing, balance deficits as well as posterior lean affecting ADLs and mobility.  CIR recommended due to functional decline.     Review of Systems  Reason unable to perform ROS: mental acuity.          Past Medical History:  Diagnosis Date   Adenomatous colon polyp     Allergy     Dementia (HCC)     GERD (gastroesophageal reflux disease)     Glaucoma     Hx of hearing loss      bilateral hearing aids   Hyperlipidemia     Hypertension     IBS (irritable bowel syndrome)     Low back pain     Thyroid disease             Past Surgical History:  Procedure Laterality Date   ABDOMINAL HYSTERECTOMY   1998    CATARACT EXTRACTION, BILATERAL Bilateral 04/2015   CHOLECYSTECTOMY   1998   COLONOSCOPY   12/10/2016   GUM SURGERY   1996 / 2013    skin graft on gums   TUBAL LIGATION   1977           Family History  Problem Relation Age of Onset   Macular degeneration Father     Prostate cancer Father     Hypertension Father     Cancer Father          prostate   Glaucoma Mother     Emphysema Mother     Hypertension Mother     Other Brother          Brain tumor   Cancer Brother 57        brain tumor   Breast cancer Maternal Grandmother     Prostate cancer Brother     Colon cancer Neg Hx     Colon polyps Neg Hx     Esophageal cancer Neg Hx     Rectal cancer Neg Hx     Stomach cancer Neg Hx        Social History: Married. Independent for mobility and needed supervision with ADLS. Per  reports that she has never smoked. She has never used smokeless tobacco. She reports that she does not drink alcohol and  does not use drugs.     Allergies: No Known Allergies           Medications Prior to Admission  Medication Sig Dispense Refill   AMBULATORY NON FORMULARY MEDICATION Take 1 tablet by mouth daily. IBgard: Takes prn       aspirin 81 MG tablet Take 81 mg by mouth daily.       CALCIUM PO Take 1 tablet by mouth 2 (two) times daily.       Cranberry 1000 MG CAPS Take 1,000 mg by mouth daily.       donepezil (ARICEPT) 10 MG tablet Take 1 tablet daily 90 tablet 3   fluticasone (FLONASE) 50 MCG/ACT nasal spray Place 2 sprays into both nostrils daily. 16 g 5   gabapentin (NEURONTIN) 300 MG capsule Take 1 cap twice a day 180 capsule 3   Lactobacillus-Inulin (CULTURELLE DIGESTIVE DAILY PO) Take 1 tablet by mouth daily.       levothyroxine (SYNTHROID) 50 MCG tablet TAKE 1 TABLET BY MOUTH EVERY DAY BEFORE BREAKFAST 30 tablet 0   loratadine (CLARITIN) 10 MG tablet TAKE 1 TABLET BY MOUTH EVERY DAY 90 tablet 1   memantine (NAMENDA) 10 MG tablet Take 1 tablet (10 mg total) by mouth 2 (two) times daily.  180 tablet 3   midodrine (PROAMATINE) 5 MG tablet TAKE 1 TABLET (5 MG TOTAL) BY MOUTH 3 (THREE) TIMES DAILY WITH MEALS. 270 tablet 1   Multiple Vitamin (MULTIVITAMIN) tablet Take 1 tablet by mouth daily.       nitrofurantoin (MACRODANTIN) 50 MG capsule Take 1 capsule (50 mg total) by mouth at bedtime. 30 capsule 5   sertraline (ZOLOFT) 100 MG tablet Take 1 tablet (100 mg total) by mouth daily. 90 tablet 3   VITAMIN D PO Take 1,000 Units by mouth daily.           Drug Regimen Review  Drug regimen was reviewed and remains appropriate with no significant issues identified   Home: Home Living Family/patient expects to be discharged to:: Private residence Living Arrangements: Spouse/significant other Available Help at Discharge: Family, Available 24 hours/day Type of Home: House Home Access: Stairs to enter [employer] of Steps: 5 Entrance Stairs-Rails: Right, Left Home Layout: One level Bathroom Shower/Tub: Multimedia programmer: Handicapped height Home Equipment: Environmental consultant - 2 wheels   Functional History: Prior Function Level of Independence: Needs assistance Gait / Transfers Assistance Needed: Spouse reports pt ambulated without AD and could ambulate in house and short community distances. ADL's / Homemaking Assistance Needed: spouse assists with bathing/ getting undressed to bath. otherwise she can perform ADL tasks.   Functional Status:  Mobility: Bed Mobility Overal bed mobility: Needs Assistance Bed Mobility: Supine to Sit, Sit to Supine Supine to sit: Mod assist, +2 for physical assistance Sit to supine: Min assist, +2 for physical assistance General bed mobility comments: limited initiation to sit up at edge of bed Transfers Overall transfer level: Needs assistance Equipment used: 2 person hand held assist Transfers: Sit to/from Stand Sit to Stand: Min assist, +2 physical assistance, +2 safety/equipment, Mod assist General transfer comment: please see  toilet transfer in ADL section Ambulation/Gait Ambulation/Gait assistance: Mod assist, +2 safety/equipment, +2 physical assistance Gait Distance (Feet): 50 Feet Assistive device: 2 person hand held assist Gait Pattern/deviations: Step-to pattern, Decreased stride length, Shuffle, Decreased dorsiflexion - right, Decreased dorsiflexion - left General Gait Details: Pt with shuffle gait and requiring HHA to steady and lead direction.  She did turn with  cues.  Very short shuffle steps - cues and assist to weight shift to increase stride length - had 1 episode that was able to improve step length but mostly shuffled Gait velocity: decreased   ADL: ADL Overall ADL's : Needs assistance/impaired Grooming: Supervision/safety, Set up, Sitting Upper Body Bathing: Minimal assistance, Sitting Lower Body Bathing: Moderate assistance, Sit to/from stand, Sitting/lateral leans Upper Body Dressing : Minimal assistance, Sitting Lower Body Dressing: Total assistance, Sitting/lateral leans Lower Body Dressing Details (indicate cue type and reason): to don socks, spouse reports he helps her at baseline "sometimes I'm short on patience" Toilet Transfer: Minimal assistance, +2 for physical assistance, +2 for safety/equipment, Ambulation, Regular Toilet Toilet Transfer Details (indicate cue type and reason): hand held assistance x2, max cues to ambulate taking small shuffled steps to bathroom. increased carry over of cues this session to initiate sitting onto toilet Toileting- Clothing Manipulation and Hygiene: Total assistance, Sit to/from stand Toileting - Clothing Manipulation Details (indicate cue type and reason): patient incontinent of stool in bed and had small bowel movement on toilet, reliant on Ue support and limited cognition therefore total A for peri care Functional mobility during ADLs: Minimal assistance, +2 for physical assistance, +2 for safety/equipment, Cueing for safety, Cueing for  sequencing General ADL Comments: patient still below her baseline with functional ambulation, decreased arousal needing mod to max cues/stimulation to participate in self care tasks.   Cognition: Cognition Overall Cognitive Status: History of cognitive impairments - at baseline Orientation Level: Oriented to person Cognition Arousal/Alertness: Awake/alert Behavior During Therapy: Flat affect Overall Cognitive Status: History of cognitive impairments - at baseline General Comments: spouse reporting patient more awake throughout the day today, was easier to arouse this OT session. following directions with increased time Difficult to assess due to: Level of arousal   Physical Exam: Blood pressure (!) 114/55, pulse (!) 58, temperature 97.8 F (36.6 C), temperature source Oral, resp. rate 16, height '5\' 3"'$  (1.6 m), weight 61.4 kg, SpO2 98 %. Physical Exam Constitutional:      Appearance: She is not ill-appearing. HENT:    Head: Normocephalic and atraumatic.    Nose: Nose normal.    Mouth/Throat:    Mouth: Mucous membranes are moist.    Pharynx: Oropharynx is clear. Eyes:    Extraocular Movements: Extraocular movements intact.    Pupils: Pupils are equal, round, and reactive to light. Cardiovascular:    Rate and Rhythm: Regular rhythm. Bradycardia present.    Heart sounds: No murmur heard. Pulmonary:    Effort: Pulmonary effort is normal. No respiratory distress.    Breath sounds: No wheezing. Abdominal:    General: Abdomen is flat. There is no distension.    Palpations: Abdomen is soft.    Tenderness: There is no abdominal tenderness. Musculoskeletal:        General: No swelling or tenderness.    Cervical back: Normal range of motion. Skin:    General: Skin is warm and dry.    Comments: Small irritation near nail of left great toe  Neurological:    Mental Status: She is alert.    Comments: Pt is alert. Makes good eye contact. Essentially non-verbal except when stimulated by  pain or an emotional phrase/sentence which might provoke a spontaneous emotion or response. Moves all 4 limbs. Seems to have extensor tone in the LE's. Senses pain in all 4's. Doesn't cooperate consistently with neurological exam.   Psychiatric:    Comments: flat      Lab Results Last 48 Hours  No results found for this or any previous visit (from the past 48 hour(s)).    Imaging Results (Last 48 hours)  MR BRAIN WO CONTRAST   Result Date: 04/10/2021 CLINICAL DATA:  Mental status change, persistent or worsening; history of meningioma. Additional history provided: Recent fall, advanced dementia, increased confusion, possible CVA. EXAM: MRI HEAD WITHOUT CONTRAST TECHNIQUE: Multiplanar, multiecho pulse sequences of the brain and surrounding structures were obtained without intravenous contrast. COMPARISON:  Prior head CT examinations 04/08/2021 and earlier. Brain MRI 05/06/2019. FINDINGS: Brain: Mild intermittent motion degradation. Moderate generalized cerebral atrophy. Comparatively mild cerebellar atrophy. Multiple hemorrhagic parenchymal contusions within the left frontal lobe measuring up to 11 mm. Redemonstrated small focus of acute hemorrhage within the septum pellucidum. Redemonstrated acute subdural hematoma along the falx and right cerebral hemisphere measuring up to 4 mm in greatest thickness. Subarachnoid hemorrhage along the left frontal lobe (along the anterior aspect of the left sylvian fissure). Small volume hemorrhage within the lateral ventricles has slightly increased in volume. No evidence of hydrocephalus at this time. Apparent cortical restricted diffusion within the left frontal lobe (along the anterior aspect of the left sylvian fissure), which may reflect artifact from subarachnoid blood products at this site, acute infarct or parenchymal contusion. Redemonstrated dural-based mass along the dorsal aspect of the clivus, eccentric to the left, compatible with a known meningioma. This  mass has not significantly changed in size as compared to the brain MRI of 05/06/2019, measuring 0.9 x 2.2 x 2.5 cm (AP x TV x CC) on today's study. Mild multifocal T2/FLAIR hyperintensity within the cerebral white matter, nonspecific but compatible with chronic small vessel ischemic disease. No midline shift. Vascular: Expected proximal arterial flow voids. Skull and upper cervical spine: No focal marrow lesion. Sinuses/Orbits: Visualized orbits show no acute finding. Bilateral lens replacements. Small right maxillary sinus mucous retention cyst. IMPRESSION: Intermittently motion degraded examination. Multiple acute hemorrhagic parenchymal contusions within the left frontal lobe measuring up to 11 mm. Redemonstrated small focus of acute hemorrhage within the septum pellucidum. Unchanged volume of acute subdural hemorrhage along the falx and right cerebral hemisphere, measuring up to 4 mm in greatest thickness. Grossly unchanged acute subarachnoid hemorrhage along the left frontal lobe (along the anterior aspect of the left sylvian fissure). Small-volume hemorrhage within the lateral ventricles has slightly increased. No evidence of hydrocephalus at this time. Apparent cortical restricted diffusion within the left frontal lobe (along the anterior aspect of the left sylvian fissure), which may reflect artifact from adjacent subarachnoid hemorrhage, acute infarct or parenchymal contusion. 2.2 x 2.5 cm meningioma along the dorsal aspect of the clivus, not significantly changed in size from the brain MRI of 05/06/2019. Parenchymal atrophy and mild chronic small vessel ischemic disease. Electronically Signed   By: Kellie Simmering DO   On: 04/10/2021 12:40             Medical Problem List and Plan: 1.  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              -patient may shower             -ELOS/Goals: supervision to min assist goals with PT, OT, SLP 2.   Antithrombotics: -DVT/anticoagulation:  Mechanical: Sequential compression devices, below knee Bilateral lower extremities             -antiplatelet therapy: N/A 3. Pain Management: Tylenol pron.  4. Mood: LCSW to follow for evaluation and support.              -  antipsychotic agents: N/a 5. Neuropsych: This patient is not capable of making decisions on her own behalf. 6. Skin/Wound Care: Routine pressure relief measures.  7. Fluids/Electrolytes/Nutrition: Monitor I/O. Check CMET on 08/08             -she has been eating next to nothing while on acute             -suspect a lot of this due to cognition 8. Kleb pneumo UTI: On Rocephin D# 3--pending finalization/sensitivity. 9. Alzheimer's dementia:Continue Namenda and aricept             --Zoloft for mood stabilization  -check sleep chart 10 Hypothyroid: On supplement. 11. Acute blood loss anemia: h/H trending down--recheck CBC on 08/08. 12. Orthostatic Hypotension: Encourage fluid intake. Continue ProAmatine 5 mg tid. 13. Chronic HA: Managed with Gabapentin BID.       Bary Leriche, PA-C 04/12/2021   I have personally performed a face to face diagnostic evaluation of this patient and formulated the key components of the plan.  Additionally, I have personally reviewed laboratory data, imaging studies, as well as relevant notes and concur with the physician assistant's documentation above.  The patient's status has not changed from the original H&P.  Any changes in documentation from the acute care chart have been noted above.  Meredith Staggers, MD, [facility]

## 2021-04-12 NOTE — Progress Notes (Signed)
Pt discharged to [facility] via [facility]. RN gave report to RN at [facility]. IV left in place per request of RN.

## 2021-04-12 NOTE — Progress Notes (Addendum)
Inpatient Rehab Admissions Coordinator:    I continue to await insurance auth for CIR.   Kishon Garriga, MS, CCC-SLP Rehab Admissions Coordinator  [phone] (celll) [phone] (office)  

## 2021-04-12 NOTE — Progress Notes (Signed)
Patient arrived to unit, husband and daughter arrived shortly after. Educated family about unit routine and they verbalized understanding. Discussed fall precautions. Patient eating dinner with family assist.

## 2021-04-12 NOTE — H&P (Signed)
Physical Medicine and Rehabilitation Admission H&P    Chief Complaint  Patient presents with   TBI with functional deficits.     HPI: Michelle [name] is a 75 year old RH-female with history of HTN, hearing loss, idiopathic hypotension, chronic UTIs, Alzheimer's dementia with behavioral disturbance/hallucinations (followed by Dr. Delice Lesch) who was admitted to [facility] on 04/08/2021 after falling and striking her forehead followed by brief loss of consciousness.  She was confused with mumbling responses and had complaints of headache.  CT head done showing left frontal scalp swelling with left frontal SAH, left frontal IPH and SDH left frontal convexity and parafalcine and tentorial distribution.  Dr. Christella Noa consulted for input and felt no surgical intervention needed and to repeat scan as needed neurological changes.   She did have worsening of mental status on 08/03 and MRI brain done showing no change in acute subdural hemorrhage and multiple acute hemorrhagic parenchymal contusions left frontal lobe measuring up to 11 mm.  Urine culture also done showing greater than 100,000 colonies of gram-negative rods and she was started on Rocephin for treatment.  Mentation is improving with patient showing increased ability to follow commands however continues to have delayed processing, balance deficits as well as posterior lean affecting ADLs and mobility.  CIR recommended due to functional decline.   Review of Systems  Reason unable to perform ROS: mental acuity.    Past Medical History:  Diagnosis Date   Adenomatous colon polyp    Allergy    Dementia (HCC)    GERD (gastroesophageal reflux disease)    Glaucoma    Hx of hearing loss    bilateral hearing aids   Hyperlipidemia    Hypertension    IBS (irritable bowel syndrome)    Low back pain    Thyroid disease     Past Surgical History:  Procedure Laterality Date   ABDOMINAL HYSTERECTOMY  1998   CATARACT EXTRACTION, BILATERAL Bilateral  04/2015   CHOLECYSTECTOMY  1998   COLONOSCOPY  12/10/2016   GUM SURGERY  1996 / 2013   skin graft on gums   TUBAL LIGATION  1977    Family History  Problem Relation Age of Onset   Macular degeneration Father    Prostate cancer Father    Hypertension Father    Cancer Father        prostate   Glaucoma Mother    Emphysema Mother    Hypertension Mother    Other Brother        Brain tumor   Cancer Brother 7       brain tumor   Breast cancer Maternal Grandmother    Prostate cancer Brother    Colon cancer Neg Hx    Colon polyps Neg Hx    Esophageal cancer Neg Hx    Rectal cancer Neg Hx    Stomach cancer Neg Hx     Social History: Married. Independent for mobility and needed supervision with ADLS. Per  reports that she has never smoked. She has never used smokeless tobacco. She reports that she does not drink alcohol and does not use drugs.   Allergies: No Known Allergies   Medications Prior to Admission  Medication Sig Dispense Refill   AMBULATORY NON FORMULARY MEDICATION Take 1 tablet by mouth daily. IBgard: Takes prn     aspirin 81 MG tablet Take 81 mg by mouth daily.      CALCIUM PO Take 1 tablet by mouth 2 (two) times daily.  Cranberry 1000 MG CAPS Take 1,000 mg by mouth daily.     donepezil (ARICEPT) 10 MG tablet Take 1 tablet daily 90 tablet 3   fluticasone (FLONASE) 50 MCG/ACT nasal spray Place 2 sprays into both nostrils daily. 16 g 5   gabapentin (NEURONTIN) 300 MG capsule Take 1 cap twice a day 180 capsule 3   Lactobacillus-Inulin (CULTURELLE DIGESTIVE DAILY PO) Take 1 tablet by mouth daily.      levothyroxine (SYNTHROID) 50 MCG tablet TAKE 1 TABLET BY MOUTH EVERY DAY BEFORE BREAKFAST 30 tablet 0   loratadine (CLARITIN) 10 MG tablet TAKE 1 TABLET BY MOUTH EVERY DAY 90 tablet 1   memantine (NAMENDA) 10 MG tablet Take 1 tablet (10 mg total) by mouth 2 (two) times daily. 180 tablet 3   midodrine (PROAMATINE) 5 MG tablet TAKE 1 TABLET (5 MG TOTAL) BY MOUTH 3  (THREE) TIMES DAILY WITH MEALS. 270 tablet 1   Multiple Vitamin (MULTIVITAMIN) tablet Take 1 tablet by mouth daily.     nitrofurantoin (MACRODANTIN) 50 MG capsule Take 1 capsule (50 mg total) by mouth at bedtime. 30 capsule 5   sertraline (ZOLOFT) 100 MG tablet Take 1 tablet (100 mg total) by mouth daily. 90 tablet 3   VITAMIN D PO Take 1,000 Units by mouth daily.       Drug Regimen Review  Drug regimen was reviewed and remains appropriate with no significant issues identified  Home: Home Living Family/patient expects to be discharged to:: Private residence Living Arrangements: Spouse/significant other Available Help at Discharge: Family, Available 24 hours/day Type of Home: House Home Access: Stairs to enter [employer] of Steps: 5 Entrance Stairs-Rails: Right, Left Home Layout: One level Bathroom Shower/Tub: Multimedia programmer: Handicapped height Home Equipment: Environmental consultant - 2 wheels   Functional History: Prior Function Level of Independence: Needs assistance Gait / Transfers Assistance Needed: Spouse reports pt ambulated without AD and could ambulate in house and short community distances. ADL's / Homemaking Assistance Needed: spouse assists with bathing/ getting undressed to bath. otherwise she can perform ADL tasks.  Functional Status:  Mobility: Bed Mobility Overal bed mobility: Needs Assistance Bed Mobility: Supine to Sit, Sit to Supine Supine to sit: Mod assist, +2 for physical assistance Sit to supine: Min assist, +2 for physical assistance General bed mobility comments: limited initiation to sit up at edge of bed Transfers Overall transfer level: Needs assistance Equipment used: 2 person hand held assist Transfers: Sit to/from Stand Sit to Stand: Min assist, +2 physical assistance, +2 safety/equipment, Mod assist General transfer comment: please see toilet transfer in ADL section Ambulation/Gait Ambulation/Gait assistance: Mod assist, +2  safety/equipment, +2 physical assistance Gait Distance (Feet): 50 Feet Assistive device: 2 person hand held assist Gait Pattern/deviations: Step-to pattern, Decreased stride length, Shuffle, Decreased dorsiflexion - right, Decreased dorsiflexion - left General Gait Details: Pt with shuffle gait and requiring HHA to steady and lead direction.  She did turn with cues.  Very short shuffle steps - cues and assist to weight shift to increase stride length - had 1 episode that was able to improve step length but mostly shuffled Gait velocity: decreased    ADL: ADL Overall ADL's : Needs assistance/impaired Grooming: Supervision/safety, Set up, Sitting Upper Body Bathing: Minimal assistance, Sitting Lower Body Bathing: Moderate assistance, Sit to/from stand, Sitting/lateral leans Upper Body Dressing : Minimal assistance, Sitting Lower Body Dressing: Total assistance, Sitting/lateral leans Lower Body Dressing Details (indicate cue type and reason): to don socks, spouse reports he helps her  at baseline "sometimes I'm short on patience" Toilet Transfer: Minimal assistance, +2 for physical assistance, +2 for safety/equipment, Ambulation, Regular Toilet Toilet Transfer Details (indicate cue type and reason): hand held assistance x2, max cues to ambulate taking small shuffled steps to bathroom. increased carry over of cues this session to initiate sitting onto toilet Toileting- Clothing Manipulation and Hygiene: Total assistance, Sit to/from stand Toileting - Clothing Manipulation Details (indicate cue type and reason): patient incontinent of stool in bed and had small bowel movement on toilet, reliant on Ue support and limited cognition therefore total A for peri care Functional mobility during ADLs: Minimal assistance, +2 for physical assistance, +2 for safety/equipment, Cueing for safety, Cueing for sequencing General ADL Comments: patient still below her baseline with functional ambulation, decreased  arousal needing mod to max cues/stimulation to participate in self care tasks.  Cognition: Cognition Overall Cognitive Status: History of cognitive impairments - at baseline Orientation Level: Oriented to person Cognition Arousal/Alertness: Awake/alert Behavior During Therapy: Flat affect Overall Cognitive Status: History of cognitive impairments - at baseline General Comments: spouse reporting patient more awake throughout the day today, was easier to arouse this OT session. following directions with increased time Difficult to assess due to: Level of arousal  Physical Exam: Blood pressure (!) 114/55, pulse (!) 58, temperature 97.8 F (36.6 C), temperature source Oral, resp. rate 16, height '5\' 3"'$  (1.6 m), weight 61.4 kg, SpO2 98 %. Physical Exam Constitutional:      Appearance: She is not ill-appearing.  HENT:     Head: Normocephalic and atraumatic.     Nose: Nose normal.     Mouth/Throat:     Mouth: Mucous membranes are moist.     Pharynx: Oropharynx is clear.  Eyes:     Extraocular Movements: Extraocular movements intact.     Pupils: Pupils are equal, round, and reactive to light.  Cardiovascular:     Rate and Rhythm: Regular rhythm. Bradycardia present.     Heart sounds: No murmur heard. Pulmonary:     Effort: Pulmonary effort is normal. No respiratory distress.     Breath sounds: No wheezing.  Abdominal:     General: Abdomen is flat. There is no distension.     Palpations: Abdomen is soft.     Tenderness: There is no abdominal tenderness.  Musculoskeletal:        General: No swelling or tenderness.     Cervical back: Normal range of motion.  Skin:    General: Skin is warm and dry.     Comments: Small irritation near nail of left great toe  Neurological:     Mental Status: She is alert.     Comments: Pt is alert. Makes good eye contact. Essentially non-verbal except when stimulated by pain or an emotional phrase/sentence which might provoke a spontaneous emotion or  response. Moves all 4 limbs. Seems to have extensor tone in the LE's. Senses pain in all 4's. Doesn't cooperate consistently with neurological exam.   Psychiatric:     Comments: flat    No results found for this or any previous visit (from the past 48 hour(s)). MR BRAIN WO CONTRAST  Result Date: 04/10/2021 CLINICAL DATA:  Mental status change, persistent or worsening; history of meningioma. Additional history provided: Recent fall, advanced dementia, increased confusion, possible CVA. EXAM: MRI HEAD WITHOUT CONTRAST TECHNIQUE: Multiplanar, multiecho pulse sequences of the brain and surrounding structures were obtained without intravenous contrast. COMPARISON:  Prior head CT examinations 04/08/2021 and earlier. Brain MRI 05/06/2019. FINDINGS:  Brain: Mild intermittent motion degradation. Moderate generalized cerebral atrophy. Comparatively mild cerebellar atrophy. Multiple hemorrhagic parenchymal contusions within the left frontal lobe measuring up to 11 mm. Redemonstrated small focus of acute hemorrhage within the septum pellucidum. Redemonstrated acute subdural hematoma along the falx and right cerebral hemisphere measuring up to 4 mm in greatest thickness. Subarachnoid hemorrhage along the left frontal lobe (along the anterior aspect of the left sylvian fissure). Small volume hemorrhage within the lateral ventricles has slightly increased in volume. No evidence of hydrocephalus at this time. Apparent cortical restricted diffusion within the left frontal lobe (along the anterior aspect of the left sylvian fissure), which may reflect artifact from subarachnoid blood products at this site, acute infarct or parenchymal contusion. Redemonstrated dural-based mass along the dorsal aspect of the clivus, eccentric to the left, compatible with a known meningioma. This mass has not significantly changed in size as compared to the brain MRI of 05/06/2019, measuring 0.9 x 2.2 x 2.5 cm (AP x TV x CC) on today's study.  Mild multifocal T2/FLAIR hyperintensity within the cerebral white matter, nonspecific but compatible with chronic small vessel ischemic disease. No midline shift. Vascular: Expected proximal arterial flow voids. Skull and upper cervical spine: No focal marrow lesion. Sinuses/Orbits: Visualized orbits show no acute finding. Bilateral lens replacements. Small right maxillary sinus mucous retention cyst. IMPRESSION: Intermittently motion degraded examination. Multiple acute hemorrhagic parenchymal contusions within the left frontal lobe measuring up to 11 mm. Redemonstrated small focus of acute hemorrhage within the septum pellucidum. Unchanged volume of acute subdural hemorrhage along the falx and right cerebral hemisphere, measuring up to 4 mm in greatest thickness. Grossly unchanged acute subarachnoid hemorrhage along the left frontal lobe (along the anterior aspect of the left sylvian fissure). Small-volume hemorrhage within the lateral ventricles has slightly increased. No evidence of hydrocephalus at this time. Apparent cortical restricted diffusion within the left frontal lobe (along the anterior aspect of the left sylvian fissure), which may reflect artifact from adjacent subarachnoid hemorrhage, acute infarct or parenchymal contusion. 2.2 x 2.5 cm meningioma along the dorsal aspect of the clivus, not significantly changed in size from the brain MRI of 05/06/2019. Parenchymal atrophy and mild chronic small vessel ischemic disease. Electronically Signed   By: Kellie Simmering DO   On: 04/10/2021 12:40       Medical Problem List and Plan: 1.  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   -patient may shower  -ELOS/Goals: supervision to min assist goals with PT, OT, SLP 2.  Antithrombotics: -DVT/anticoagulation:  Mechanical: Sequential compression devices, below knee Bilateral lower extremities  -antiplatelet therapy: N/A 3. Pain Management: Tylenol pron.  4.  Mood: LCSW to follow for evaluation and support.   -antipsychotic agents: N/a 5. Neuropsych: This patient is not capable of making decisions on her own behalf. 6. Skin/Wound Care: Routine pressure relief measures.  7. Fluids/Electrolytes/Nutrition: Monitor I/O. Check CMET on 08/08  -she has been eating next to nothing while on acute  -suspect a lot of this due to cognition 8. Kleb pneumo UTI: On Rocephin D# 3--pending finalization/sensitivity.  9. Alzheimer's dementia:Continue Namenda and aricept  --Zoloft for mood stabilization 10 Hypothyroid: On supplement.  11. Acute blood loss anemia: h/H trending down--recheck CBC on 08/08.  12. Orthostatic Hypotension: Encourage fluid intake. Continue ProAmatine 5 mg tid.  13. Chronic HA: Managed with Gabapentin BID.        Bary Leriche, PA-C 04/12/2021

## 2021-04-12 NOTE — Progress Notes (Signed)
Inpatient Rehabilitation Medication Review by a Pharmacist  A complete drug regimen review was completed for this patient to identify any potential clinically significant medication issues.  Clinically significant medication issues were identified:  no  Check AMION for pharmacist assigned to patient if future medication questions/issues arise during this admission.  Pharmacist comments:   Time spent performing this drug regimen review (minutes):  20   Jibril Mcminn 04/12/2021 3:07 PM

## 2021-04-13 DIAGNOSIS — N390 Urinary tract infection, site not specified: Secondary | ICD-10-CM | POA: Diagnosis not present

## 2021-04-13 DIAGNOSIS — G309 Alzheimer's disease, unspecified: Secondary | ICD-10-CM | POA: Diagnosis not present

## 2021-04-13 DIAGNOSIS — A499 Bacterial infection, unspecified: Secondary | ICD-10-CM

## 2021-04-13 DIAGNOSIS — S065X2S Traumatic subdural hemorrhage with loss of consciousness of 31 minutes to 59 minutes, sequela: Secondary | ICD-10-CM | POA: Diagnosis not present

## 2021-04-13 LAB — URINE CULTURE: Culture: >=100000 — AB

## 2021-04-13 MED ORDER — CEPHALEXIN 250 MG PO CAPS
500.0000 mg | ORAL_CAPSULE | Freq: Two times a day (BID) | ORAL | Status: AC
  Administered 2021-04-13 – 2021-04-17 (×8): 500 mg via ORAL
  Filled 2021-04-13 (×8): qty 2

## 2021-04-13 NOTE — Evaluation (Signed)
Physical Therapy Assessment and Plan  Patient Details  Name: Michelle [name] MRN: [id number] Date of Birth: 05-04-1946  PT Diagnosis: Abnormal posture, Abnormality of gait, Cognitive deficits, Difficulty walking, Impaired cognition, Muscle weakness, and Pain in back Rehab Potential: Good ELOS: ~2.5 weeks   Today's Date: 04/13/2021 PT Individual Time: 6073-7106 PT Individual Time Calculation (min): 72 min    Hospital Problem: Principal Problem:   Subdural hemorrhage following injury [facility])   Past Medical History:  Past Medical History:  Diagnosis Date   Adenomatous colon polyp    Allergy    Dementia ([place])    GERD (gastroesophageal reflux disease)    Glaucoma    History of chronic cough    Hx of hearing loss    bilateral hearing aids   Hyperlipidemia    Hypertension    IBS (irritable bowel syndrome)    Low back pain    Thyroid disease    Past Surgical History:  Past Surgical History:  Procedure Laterality Date   ABDOMINAL HYSTERECTOMY  1998   CATARACT EXTRACTION, BILATERAL Bilateral 04/2015   CHOLECYSTECTOMY  1998   COLONOSCOPY  12/10/2016   GUM SURGERY  1996 / 2013   skin graft on gums   TUBAL LIGATION  1977    Assessment & Plan Clinical Impression: Patient is a 75 y.o. RH-female with history of HTN, hearing loss, idiopathic hypotension, chronic UTIs, Alzheimer's dementia with behavioral disturbance/hallucinations (followed by Dr. Delice Lesch) who was admitted to [facility] on 04/08/2021 after falling and striking her forehead followed by brief loss of consciousness.  She was confused with mumbling responses and had complaints of headache.  CT head done showing left frontal scalp swelling with left frontal SAH, left frontal IPH and SDH left frontal convexity and parafalcine and tentorial distribution.  Dr. Christella Noa consulted for input and felt no surgical intervention needed and to repeat scan as needed neurological changes.   She did have worsening of mental status on 08/03 and MRI  brain done showing no change in acute subdural hemorrhage and multiple acute hemorrhagic parenchymal contusions left frontal lobe measuring up to 11 mm.  Urine culture also done showing greater than 100,000 colonies of gram-negative rods and she was started on Rocephin for treatment.  Mentation is improving with patient showing increased ability to follow commands however continues to have delayed processing, balance deficits as well as posterior lean affecting ADLs and mobility.  CIR recommended due to functional decline. Patient transferred to CIR on 04/12/2021 .   Patient currently requires mod assist with mobility secondary to muscle weakness, decreased cardiorespiratoy endurance, unbalanced muscle activation and decreased motor planning,  , decreased initiation, decreased attention, decreased awareness, decreased problem solving, decreased safety awareness, decreased memory, and delayed processing, and decreased sitting balance, decreased standing balance, decreased postural control, and decreased balance strategies.  Prior to hospitalization, patient was independent  with mobility and lived with Spouse in a House home.  Home access is 5Stairs to enter.  Patient will benefit from skilled PT intervention to maximize safe functional mobility, minimize fall risk, and decrease caregiver burden for planned discharge home with 24 hour supervision.  Anticipate patient will benefit from follow up OP at discharge.  PT - End of Session Activity Tolerance: Tolerates 30+ min activity with multiple rests Endurance Deficit: Yes Endurance Deficit Description: frequent seated rest breaks PT Assessment Rehab Potential (ACUTE/IP ONLY): Good PT Barriers to Discharge: Home environment access/layout;Incontinence PT Patient demonstrates impairments in the following area(s): Balance;Safety;Behavior;Sensory;Edema;Skin Integrity;Endurance;Motor;Nutrition;Pain;Perception PT Transfers Functional Problem(s): Bed Mobility;Bed  to Chair;Car;Furniture;Floor PT Locomotion Functional Problem(s): Ambulation;Stairs PT Plan PT Intensity: Minimum of 1-2 x/day ,45 to 90 minutes PT Frequency: 5 out of 7 days PT Duration Estimated Length of Stay: ~2.5 weeks PT Treatment/Interventions: Ambulation/gait training;Community reintegration;DME/adaptive equipment instruction;Neuromuscular re-education;Psychosocial support;Stair training;UE/LE Strength taining/ROM;Balance/vestibular training;Discharge planning;Functional electrical stimulation;Pain management;Therapeutic Activities;Skin care/wound management;UE/LE Coordination activities;Cognitive remediation/compensation;Disease management/prevention;Functional mobility training;Patient/family education;Splinting/orthotics;Therapeutic Exercise;Visual/perceptual remediation/compensation PT Transfers Anticipated Outcome(s): supervision using LRAD PT Locomotion Anticipated Outcome(s): supervision using LRAD PT Recommendation Follow Up Recommendations: Outpatient PT;24 hour supervision/assistance Patient destination: Home   PT Evaluation Precautions/Restrictions  Precautions Precautions: Fall;Other (comment) Precaution Comments: HOH Restrictions Weight Bearing Restrictions: No Pain Pain Assessment Pain Scale: Faces Faces Pain Scale: Hurts little more Pain Type: Other (Comment);Acute pain (pt unable to confirm but likely acute) Pain Location: Back Pain Onset: On-going (during sit<>stand transfers) Pain Intervention(s): Rest;Repositioned Home Living/Prior Functioning Home Living Available Help at Discharge: Family;Available 24 hours/day (husband, daughter, and grandson) Type of Home: House Home Access: Stairs to enter Technical brewer of Steps: 5 Entrance Stairs-Rails: Right;Left;Can reach both Home Layout: One level  Lives With: Spouse (husband, Butch, but pt calls him "Butchie boy") Prior Function Level of Independence: Independent with gait;Independent with  transfers  Able to Take Stairs?: Yes Driving: No Comments: independent with mobility but required supervision/set-up assist for ADLs Perception  Perception Perception: Impaired Figure Ground: noticed impaired depth perception Praxis Praxis: Impaired Praxis Impairment Details: Motor planning;Initiation  Cognition Overall Cognitive Status: History of cognitive impairments - at baseline Arousal/Alertness: Awake/alert Orientation Level: Oriented to person (oriented to her name and her husband's nickname but not her birthday nor any other orientation questions) Attention: Focused;Sustained Focused Attention: Impaired Sustained Attention: Impaired Memory: Impaired (Alzheimer's has primarily presented as short term memory loss) Awareness: Impaired Safety/Judgment: Impaired Comments: Alzheimer's has primarily presented as short term memory loss (within the day or within a few minutes) Sensation  Sensation Light Touch: Not tested (unable to formally assess despite attempts as pt unable to follow directions) Hot/Cold: Not tested Proprioception: Appears Intact Coordination Gross Motor Movements are Fluid and Coordinated: No Coordination and Movement Description: GM movements are impaired due to impaired balance and general B LE weakness Motor  Motor Motor: Abnormal postural alignment and control Motor - Skilled Clinical Observations: anterior lean/LOB during gait, generalized weakness and deconditioning   Trunk/Postural Assessment  Cervical Assessment Cervical Assessment: Exceptions to [facility] (forward head) Thoracic Assessment Thoracic Assessment: Exceptions to [facility] (thoracic rounding) Lumbar Assessment Lumbar Assessment: Exceptions to [facility] (posterior pelvic tilt in sitting) Postural Control Postural Control: Deficits on evaluation  Balance Balance Balance Assessed: Yes Static Sitting Balance Static Sitting - Balance Support: Feet supported Static Sitting - Level of Assistance: 4:  Min assist Dynamic Sitting Balance Dynamic Sitting - Level of Assistance: 4: Min assist Static Standing Balance Static Standing - Balance Support: During functional activity;Right upper extremity supported Static Standing - Level of Assistance: 3: Mod assist Dynamic Standing Balance Dynamic Standing - Balance Support: During functional activity;Right upper extremity supported Dynamic Standing - Level of Assistance: 3: Mod assist;2: Max assist Extremity Assessment      RLE Assessment RLE Assessment: Exceptions to [facility] Passive Range of Motion (PROM) Comments: [facility] General Strength Comments: unable to formally assess due to inability to follow commands but demonstrates grossly 3+/5 to 4/5 functionally LLE Assessment LLE Assessment: Exceptions to [facility] Active Range of Motion (AROM) Comments: [facility] General Strength Comments: unable to formally assess due to inability to follow commands but demonstrates grossly 3+/5 to 4/5 functionally  Care Tool Care Tool  Bed Mobility Roll left and right activity   Roll left and right assist level: Moderate Assistance - Patient 50 - 74%    Sit to lying activity   Sit to lying assist level: Moderate Assistance - Patient 50 - 74%    Lying to sitting edge of bed activity   Lying to sitting edge of bed assist level: Maximal Assistance - Patient 25 - 49%     Care Tool Transfers Sit to stand transfer   Sit to stand assist level: Moderate Assistance - Patient 50 - 74%    Chair/bed transfer   Chair/bed transfer assist level: Moderate Assistance - Patient 50 - 74%     Toilet transfer   Assist Level: Moderate Assistance - Patient 50 - 74% Assistive Device Comment: Counselling psychologist transfer assist level: Moderate Assistance - Patient 50 - 74%      Care Tool Locomotion Ambulation   Assist level: Moderate Assistance - Patient 50 - 74% Assistive device: Hand held assist (R HHA) Max distance: 34ft  Walk 10 feet activity   Assist level: Moderate  Assistance - Patient - 50 - 74% Assistive device: Hand held assist   Walk 50 feet with 2 turns activity   Assist level: Moderate Assistance - Patient - 50 - 74% Assistive device: Hand held assist  Walk 150 feet activity Walk 150 feet activity did not occur: Safety/medical concerns      Walk 10 feet on uneven surfaces activity Walk 10 feet on uneven surfaces activity did not occur: Safety/medical concerns      Stairs   Assist level: Moderate Assistance - Patient - 50 - 74% Stairs assistive device: 2 hand rails Max number of stairs: 4  Walk up/down 1 step activity   Walk up/down 1 step (curb) assist level: Moderate Assistance - Patient - 50 - 74% Walk up/down 1 step or curb assistive device: 2 hand rails    Walk up/down 4 steps activity Walk up/down 4 steps assist level: Moderate Assistance - Patient - 50 - 74% Walk up/down 4 steps assistive device: 2 hand rails  Walk up/down 12 steps activity Walk up/down 12 steps activity did not occur: Safety/medical concerns      Pick up small objects from floor Pick up small object from the floor (from standing position) activity did not occur: Safety/medical concerns      Wheelchair Will patient use wheelchair at discharge?: No (TBD)          Wheel 50 feet with 2 turns activity      Wheel 150 feet activity        Refer to Care Plan for Long Term Goals  SHORT TERM GOAL WEEK 1 PT Short Term Goal 1 (Week 1): Pt will perform supine<>sit with mod assist PT Short Term Goal 2 (Week 1): Pt will perform sit<>stands with min assist PT Short Term Goal 3 (Week 1): Pt will perform bed<>chair transfers with min assist PT Short Term Goal 4 (Week 1): Pt will ambulate at least 68ft using LRAD with min assist PT Short Term Goal 5 (Week 1): Pt will navigate up/down 4 steps using B HRs with min assist  Recommendations for other services: None   Skilled Therapeutic Intervention Pt received sitting in w/c with her husband, Butch, present and pt  agreeable to therapy session. Pt with limited verbal responses to questions, then primarily with 1-word responses. Evaluation completed (see details above) with patient education regarding purpose of PT evaluation, PT POC  and goals, therapy schedule, weekly team meetings, and other CIR information including safety plan and fall risk safety. Pt completed the below mobility tasks with the specified level of assistance. Pt requires increased time and cuing to initiate tasks despite responding "yes" or "OK" when given question/command to start mobility task. During sit<>stands pt has delayed B LE hip/knee and trunk extension and moans in discomfort - determined pt likely experiencing back pain during this transition. During gait training pt noted to have shuffled steps with L forward trunk lean and intermittent mild anterior LOB due to this. During car transfer when given additional time, pt able to problem solve how to enter/exit the vehicle using side step technique (pt's husband reports this is her typical motor plan). Pt noted to be incontinent of bladder during session, pt unable to confirm incontinence demonstrating unawareness of this. Assisted pt via HHA gait training in/out of bathroom requiring mod assist when exiting due to larger anterior LOB stepping down bathroom threshold. Pt able to manage LB clothing with assist for pant button. Further continent of bladder on toilet. At end of session pt left seated in w/c with needs in reach, seat belt alarm on, and husband present.   Mobility Bed Mobility Bed Mobility: Supine to Sit;Sit to Supine Supine to Sit: Maximal Assistance - Patient - Patient 25-49% Sit to Supine: Maximal Assistance - Patient 25-49% Transfers Transfers: Sit to Stand;Stand to Sit;Stand Pivot Transfers Sit to Stand: Moderate Assistance - Patient 50-74% Stand to Sit: Minimal Assistance - Patient > 75% Stand Pivot Transfers: Moderate Assistance - Patient 50 - 74% Stand Pivot Transfer  Details: Tactile cues for sequencing;Tactile cues for initiation;Verbal cues for technique;Tactile cues for weight shifting;Tactile cues for placement;Visual cues/gestures for sequencing;Verbal cues for sequencing Stand Pivot Transfer Details (indicate cue type and reason): pt responds well to gentle guidance and visual cuing Transfer (Assistive device): 1 person hand held assist Locomotion  Gait Ambulation: Yes Gait Assistance: Moderate Assistance - Patient 50-74% Gait Distance (Feet): 80 Feet Assistive device: 1 person hand held assist (R HHA) Gait Assistance Details: Verbal cues for technique;Verbal cues for sequencing;Tactile cues for sequencing;Tactile cues for weight shifting;Tactile cues for posture;Tactile cues for weight beaing;Visual cues/gestures for sequencing;Verbal cues for gait pattern;Manual facilitation for weight shifting Gait Gait: Yes Gait Pattern: Impaired Gait Pattern: Step-through pattern;Trunk flexed;Lateral trunk lean to left;Shuffle;Decreased stride length;Decreased step length - right;Decreased step length - left;Poor foot clearance - left;Poor foot clearance - right;Narrow base of support Gait velocity: decreased Stairs / Additional Locomotion Stairs: Yes Stairs Assistance: Moderate Assistance - Patient 50 - 74% Stair Management Technique: Two rails Number of Stairs: 4 Height of Stairs: 6 Wheelchair Mobility Wheelchair Mobility: No   Discharge Criteria: Patient will be discharged from PT if patient refuses treatment 3 consecutive times without medical reason, if treatment goals not met, if there is a change in medical status, if patient makes no progress towards goals or if patient is discharged from hospital.  The above assessment, treatment plan, treatment alternatives and goals were discussed and mutually agreed upon: by patient and by family  Tawana Scale , PT, DPT, NCS, CSRS 04/13/2021, 12:33 PM

## 2021-04-13 NOTE — Progress Notes (Signed)
PRN trazodone '25mg'$  given po-effective. Pt slept well throughout the night. No s/sx of pain. Pt incontinent of urine throughout the night. Turned and repositioned by staff. No attempts made to get up unassisted.

## 2021-04-13 NOTE — Progress Notes (Signed)
PROGRESS NOTE   Subjective/Complaints: Nursing reports that she had a pretty good night. Husband took hearing aids home, so she can't hear much.   ROS: Limited due to cognitive/behavioral    Objective:   No results found. No results for input(s): WBC, HGB, HCT, PLT in the last 72 hours. No results for input(s): NA, K, CL, CO2, GLUCOSE, BUN, CREATININE, CALCIUM in the last 72 hours.  Intake/Output Summary (Last 24 hours) at 04/13/2021 1157 Last data filed at 04/13/2021 0700 Gross per 24 hour  Intake 70.32 ml  Output 126 ml  Net -55.68 ml        Physical Exam: Vital Signs Blood pressure 127/68, pulse (!) 55, temperature 97.7 F (36.5 C), temperature source Oral, resp. rate 17, height '5\' 3"'$  (1.6 m), weight 60 kg, SpO2 95 %.  General: Alert  , No apparent distress HEENT: Head is normocephalic, atraumatic, PERRLA, EOMI, sclera anicteric, oral mucosa pink and moist, dentition intact, ext ear canals clear,  Neck: Supple without JVD or lymphadenopathy Heart: Reg rate and rhythm. No murmurs rubs or gallops Chest: CTA bilaterally without wheezes, rales, or rhonchi; no distress Abdomen: Soft, non-tender, non-distended, bowel sounds positive. Extremities: No clubbing, cyanosis, or edema. Pulses are 2+ Psych: Pt's affect is appropriate. Pt is cooperative Skin: Clean and intact without signs of breakdown Neuro:  makes eye contact. Very HOH. Difficult to assess comprehension today. Tends to speak when spoken to and when comprehends. Moves all 4 limbs.  Musculoskeletal: no swelling or pain in limbs/trunk    Assessment/Plan: 1. Functional deficits which require 3+ hours per day of interdisciplinary therapy in a comprehensive inpatient rehab setting. Physiatrist is providing close team supervision and 24 hour management of active medical problems listed below. Physiatrist and rehab team continue to assess barriers to discharge/monitor  patient progress toward functional and medical goals  Care Tool:  Bathing              Bathing assist       Upper Body Dressing/Undressing Upper body dressing        Upper body assist      Lower Body Dressing/Undressing Lower body dressing            Lower body assist       Toileting Toileting    Toileting assist       Transfers Chair/bed transfer  Transfers assist           Locomotion Ambulation   Ambulation assist              Walk 10 feet activity   Assist           Walk 50 feet activity   Assist           Walk 150 feet activity   Assist           Walk 10 feet on uneven surface  activity   Assist           Wheelchair     Assist               Wheelchair 50 feet with 2 turns activity    Assist  Wheelchair 150 feet activity     Assist          Blood pressure 127/68, pulse (!) 55, temperature 97.7 F (36.5 C), temperature source Oral, resp. rate 17, height '5\' 3"'$  (1.6 m), weight 60 kg, SpO2 95 %.  Medical Problem List and Plan: 1.  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              -patient may shower             -ELOS/Goals: supervision to min assist goals with PT, OT, SLP  Patient is beginning CIR therapies today including PT and OT SLP  -asked RN to have a white board in room for communication  -really needs hearing aids 2.  Antithrombotics: -DVT/anticoagulation:  Mechanical: Sequential compression devices, below knee Bilateral lower extremities             -antiplatelet therapy: N/A 3. Pain Management: Tylenol prn  -gabapentin for chronic headache 4. Mood: LCSW to follow for evaluation and support.              -antipsychotic agents: N/a 5. Neuropsych: This patient is not capable of making decisions on her own behalf. 6. Skin/Wound Care: Routine pressure relief measures.  7. Fluids/Electrolytes/Nutrition:  Monitor I/O. Check CMET on 08/08             -need to work on increasing PO intake             -suspect a lot of this due to cognition  -consider megace trial 8. Kleb pneumo UTI: On Rocephin D# 4--S still pending 9. Alzheimer's dementia:Continue Namenda and aricept             --Zoloft for mood stabilization             -continue sleep chart 10 Hypothyroid: On supplement. 11. Acute blood loss anemia: h/H trending down--recheck CBC on 08/08. 12. Orthostatic Hypotension: Encourage fluid intake. Continue ProAmatine 5 mg tid.      LOS: 1 days A FACE TO [place] 04/13/2021, 11:57 AM

## 2021-04-13 NOTE — Evaluation (Signed)
Speech Language Pathology Assessment and Plan  Patient Details  Name: BRANDIS WIXTED MRN: [id number] Date of Birth: 06/26/1946  SLP Diagnosis: Cognitive Impairments;Speech and Language deficits  Rehab Potential: Good ELOS: 10-14 days   Today's Date: 04/13/2021 SLP Individual Time: 9678-9381 SLP Individual Time Calculation (min): 60 min  Hospital Problem: Principal Problem:   Subdural hemorrhage following injury ([place])  Past Medical History:  Past Medical History:  Diagnosis Date   Adenomatous colon polyp    Allergy    Dementia ([place])    GERD (gastroesophageal reflux disease)    Glaucoma    History of chronic cough    Hx of hearing loss    bilateral hearing aids   Hyperlipidemia    Hypertension    IBS (irritable bowel syndrome)    Low back pain    Thyroid disease    Past Surgical History:  Past Surgical History:  Procedure Laterality Date   ABDOMINAL HYSTERECTOMY  1998   CATARACT EXTRACTION, BILATERAL Bilateral 04/2015   CHOLECYSTECTOMY  1998   COLONOSCOPY  12/10/2016   GUM SURGERY  1996 / 2013   skin graft on gums   TUBAL LIGATION  1977    Assessment / Plan / Recommendation Clinical Impression  Allen Basista is a 75 year old RH-female with history of HTN, hearing loss, idiopathic hypotension, chronic UTIs, Alzheimer's dementia with behavioral disturbance/hallucinations (followed by Dr. Delice Lesch) who was admitted to [facility] on 04/08/2021 after falling and striking her forehead followed by brief loss of consciousness.  She was confused with mumbling responses and had complaints of headache.  CT head done showing left frontal scalp swelling with left frontal SAH, left frontal IPH and SDH left frontal convexity and parafalcine and tentorial distribution.  Dr. Christella Noa consulted for input and felt no surgical intervention needed and to repeat scan as needed neurological changes.   She did have worsening of mental status on 08/03 and MRI brain done showing no change in acute  subdural hemorrhage and multiple acute hemorrhagic parenchymal contusions left frontal lobe measuring up to 11 mm.  Urine culture also done showing greater than 100,000 colonies of gram-negative rods and she was started on Rocephin for treatment.  Mentation is improving with patient showing increased ability to follow commands however continues to have delayed processing, balance deficits as well as posterior lean affecting ADLs and mobility.  CIR recommended due to functional decline.  Evaluation impacted by pts significant lethargy and difficulty maintaining attention/alertness, however pt presents with severe cognitive impairment impacting expressive and receptive language at this time. Spoke with husband after assessment who states at baseline pt has inconsistent functional recall, orientation and initiation in social settings as a result of her Alzheimer's Dementia. He does state her decreased alertness and attention is a change since her fall. Pt answering simple yes/no questions on 40% of opportunities with max cues, correct response <10% of the time. Pt able to state her name but unable to state DOB, age, city in which she lives, or names of family members. Pt following 1-step directions ~25% of time, 2-step directions with 0% ability. Focused attention averaging <30 seconds. Pt seen with regular texture snack and thin liquid via straw for brief assessment of swallow function, no overt s/s aspiration or significant difficulty noted. Continue regular/thin diet changing to full supervision 2' pt fatigue and decreased self-monitoring.  Pt will benefit from skilled ST in CIR to increase safety and independence with daily routine before discharge home with family to provide 24/7 supervision and  care as needed.   Skilled Therapeutic Interventions          Pt participating in nonstandardized assessment of cognitive linguistic function. Please see above  SLP Assessment  Patient will need skilled Speech  [place] Pathology Services during CIR admission    Recommendations  Compensations: Minimize environmental distractions Postural Changes and/or Swallow Maneuvers: Seated upright 90 degrees Oral Care Recommendations: Oral care BID Patient destination: Home Follow up Recommendations: Home Health SLP;Outpatient SLP Equipment Recommended: None recommended by SLP    SLP Frequency 3 to 5 out of 7 days   SLP Duration  SLP Intensity  SLP Treatment/Interventions 10-14 days  Minumum of 1-2 x/day, 30 to 90 minutes  Cognitive remediation/compensation;Speech/Language facilitation;Therapeutic Activities;Therapeutic Exercise;Cueing hierarchy;Functional tasks;Medication managment;Patient/family education    Pain Pain Assessment Pain Scale: Faces Pain Score: 0-No pain  Prior Functioning Cognitive/Linguistic Baseline: Baseline deficits Baseline deficit details: DX Alzheimer's dementia  Lives With: Spouse Available Help at Discharge: Family;Available 24 hours/day  SLP Evaluation Cognition Overall Cognitive Status: History of cognitive impairments - at baseline Arousal/Alertness: Lethargic Orientation Level: Oriented to person Attention: Focused Focused Attention: Impaired Focused Attention Impairment: Verbal basic;Functional basic Memory: Impaired Memory Impairment: Storage deficit;Retrieval deficit;Decreased short term memory;Decreased recall of new information Decreased Short Term Memory: Verbal basic;Functional basic Safety/Judgment: Impaired Comments: need further assessment, pt lethargic  Comprehension Auditory Comprehension Overall Auditory Comprehension: Impaired Yes/No Questions: Impaired Basic Biographical Questions: 26-50% accurate Basic Immediate Environment Questions: 0-24% accurate Commands: Impaired One Step Basic Commands: 25-49% accurate Two Step Basic Commands: 0-24% accurate Conversation: Simple Interfering Components: Attention;Hearing;Processing  speed EffectiveTechniques: Extra processing time;Increased volume;Pausing;Repetition;Slowed speech Reading Comprehension Reading Status: Not tested Expression Expression Primary Mode of Expression: Verbal Verbal Expression Overall Verbal Expression: Impaired Initiation: Impaired Automatic Speech: Name Level of Generative/Spontaneous Verbalization: Word;Phrase Repetition: Impaired Level of Impairment: Word level;Phrase level Naming: Impairment Responsive: 26-50% accurate Confrontation: Impaired Verbal Errors: Not aware of errors Interfering Components: Attention Written Expression Written Expression: Not tested Oral Motor Oral Motor/Sensory Function Overall Oral Motor/Sensory Function: Within functional limits  Care Tool Care Tool Cognition Expression of Ideas and Wants Expression of Ideas and Wants: Frequent difficulty - frequently exhibits difficulty with expressing needs and ideas   Understanding Verbal and Non-Verbal Content Understanding Verbal and Non-Verbal Content: Sometimes understands - understands only basic conversations or simple, direct phrases. Frequently requires cues to understand   Memory/Recall Ability *first 3 days only Memory/Recall Ability *first 3 days only: None of the above were recalled     Short Term Goals: Week 1: SLP Short Term Goal 1 (Week 1): Pt will increase orientation to self, place, time, biographical concepts and recent medical events with max A verbal and visual cues SLP Short Term Goal 2 (Week 1): Pt will follow 1-2 step functional directions with mod A cues SLP Short Term Goal 3 (Week 1): Pt will increase focused attention to 5 minutes provided mod A cues SLP Short Term Goal 4 (Week 1): Pt will answer simple yes/no questions with 75% accuracy min A SLP Short Term Goal 5 (Week 1): Pt will demonstrate simple problem solving for functional tasks 50% of opportunities provided mod A cues  Refer to Care Plan for Long Term  Goals  Recommendations for other services: None   Discharge Criteria: Patient will be discharged from SLP if patient refuses treatment 3 consecutive times without medical reason, if treatment goals not met, if there is a change in medical status, if patient makes no progress towards goals or if patient is discharged from hospital.  The above assessment, treatment plan, treatment alternatives and goals were discussed and mutually agreed upon: by patient  Dewaine Conger 04/13/2021, 9:51 AM

## 2021-04-13 NOTE — Evaluation (Signed)
Occupational Therapy Assessment and Plan  Patient Details  Name: Michelle [name] MRN: [id number] Date of Birth: 01/14/1946  OT Diagnosis: altered mental status, cognitive deficits, disturbance of vision, muscle weakness (generalized), and traumatic brain injury Rehab Potential:   ELOS: 2 weeks   Today's Date: 04/13/2021 OT Individual Time: 1027-2536 OT Individual Time Calculation (min): 42 min     Hospital Problem: Principal Problem:   Subdural hemorrhage following injury ([place])   Past Medical History:  Past Medical History:  Diagnosis Date   Adenomatous colon polyp    Allergy    Dementia ([place])    GERD (gastroesophageal reflux disease)    Glaucoma    History of chronic cough    Hx of hearing loss    bilateral hearing aids   Hyperlipidemia    Hypertension    IBS (irritable bowel syndrome)    Low back pain    Thyroid disease    Past Surgical History:  Past Surgical History:  Procedure Laterality Date   ABDOMINAL HYSTERECTOMY  1998   CATARACT EXTRACTION, BILATERAL Bilateral 04/2015   CHOLECYSTECTOMY  1998   COLONOSCOPY  12/10/2016   GUM SURGERY  1996 / 2013   skin graft on gums   TUBAL LIGATION  1977    Assessment & Plan Clinical Impression: Patient is a 75 y.o. year old female with history of HTN, hearing loss, idiopathic hypotension, chronic UTIs, Alzheimer's dementia with behavioral disturbance/hallucinations (followed by Dr. Delice Lesch) who was admitted to [facility] on 04/08/2021 after falling and striking her forehead followed by brief loss of consciousness.  She was confused with mumbling responses and had complaints of headache.  CT head done showing left frontal scalp swelling with left frontal SAH, left frontal IPH and SDH left frontal convexity and parafalcine and tentorial distribution.  Dr. Christella Noa consulted for input and felt no surgical intervention needed and to repeat scan as needed neurological changes.   She did have worsening of mental status on 08/03 and MRI brain  done showing no change in acute subdural hemorrhage and multiple acute hemorrhagic parenchymal contusions left frontal lobe measuring up to 11 mm.  Urine culture also done showing greater than 100,000 colonies of gram-negative rods and she was started on Rocephin for treatment.  Mentation is improving with patient showing increased ability to follow commands however continues to have delayed processing, balance deficits as well as posterior lean affecting ADLs and mobility.   Patient transferred to CIR on 04/12/2021 .    Patient currently requires max with basic self-care skills secondary to muscle weakness, decreased cardiorespiratoy endurance, impaired timing and sequencing, decreased coordination, and decreased motor planning, impaired visual attention/processing, decreased initiation, decreased attention, decreased awareness, decreased problem solving, decreased safety awareness, decreased memory, and delayed processing, and decreased sitting balance, decreased standing balance, and decreased postural control.  Prior to hospitalization, patient could complete adl with min.  Patient will benefit from skilled intervention to decrease level of assist with basic self-care skills prior to discharge home with care partner.  Anticipate patient will require 24 hour supervision and minimal physical assistance and follow up home health.  OT - End of Session Activity Tolerance: Tolerates 10 - 20 min activity with multiple rests Endurance Deficit: Yes Endurance Deficit Description: difficulty with waking up at start of session and appears fatigued at times with adl tasks OT Assessment OT Patient demonstrates impairments in the following area(s): Balance;Cognition;Endurance;Motor;Perception;Safety;Vision OT Basic ADL's Functional Problem(s): Eating;Grooming;Bathing;Dressing;Toileting OT Transfers Functional Problem(s): Toilet;Tub/Shower OT Plan OT Intensity: Minimum of 1-2 x/day,  45 to 90 minutes OT Frequency:  5 out of 7 days OT Duration/Estimated Length of Stay: 2 weeks OT Treatment/Interventions: Balance/vestibular training;Cognitive remediation/compensation;Discharge planning;DME/adaptive equipment instruction;Functional mobility training;Patient/family education;Self Care/advanced ADL retraining;Therapeutic Activities;Therapeutic Exercise;UE/LE Strength taining/ROM;UE/LE Coordination activities;Visual/perceptual remediation/compensation OT Self Feeding Anticipated Outcome(s): set up/independent OT Basic Self-Care Anticipated Outcome(s): CS/min A OT Toileting Anticipated Outcome(s): CS OT Bathroom Transfers Anticipated Outcome(s): CS/CGA OT Recommendation Patient destination: Home Follow Up Recommendations: Home health OT Equipment Recommended: To be determined   OT Evaluation Precautions/Restrictions  Precautions Precautions: Fall Precaution Comments: HOH Restrictions Weight Bearing Restrictions: No General Family/Caregiver Present: Yes Vital Signs Therapy Vitals Temp: 97.7 F (36.5 C) Temp Source: Oral Pulse Rate: (!) 56 Resp: 17 BP: (!) 127/59 Patient Position (if appropriate): Sitting Oxygen Therapy SpO2: 99 % O2 Device: Room Air Pain Pain Assessment Pain Scale: FLACC Faces Pain Scale: No hurt PAINAD (Pain Assessment in Advanced Dementia) Breathing: normal Negative Vocalization: none Facial Expression: smiling or inexpressive Body Language: relaxed Consolability: no need to console PAINAD Score: 0 Home Living/Prior Functioning Home Living Family/patient expects to be discharged to:: Private residence Living Arrangements: Spouse/significant other Available Help at Discharge: Family, Available 24 hours/day Type of Home: House Home Access: Stairs to enter [employer] of Steps: 5 Entrance Stairs-Rails: Right, Left, Can reach both Home Layout: One level Bathroom Shower/Tub: Multimedia programmer: Handicapped height  Lives With: Spouse Prior  Function Level of Independence: Needs assistance with ADLs Bath: Minimal Dressing: Supervision/set-up  Able to Take Stairs?: Yes Driving: No Comments: independent with mobility but required supervision/set-up assist for ADLs Vision Baseline Vision/History:  (history of cataract surgery, did not require glasses prior to fall as per husband) Patient Visual Report: Other (comment) (patient unable to answer questions related to vision) Vision Assessment?: Yes Eye Alignment: Within Functional Limits Ocular Range of Motion: Within Functional Limits Alignment/Gaze Preference: Within Defined Limits Tracking/Visual Pursuits: Other (comment) (locating husband and responding in all directions, poor attention and ability to follow directions for visual assessment) Additional Comments: eyes grossly symmetrical, no squinting/head tilt with funcitonal activity, she is able to focus on husband in all directions but does not read on command or follow directions for convergence, fields, saccades Perception  Perception: Impaired Praxis Praxis: Impaired Praxis Impairment Details: Motor planning;Initiation Cognition Overall Cognitive Status: History of cognitive impairments - at baseline Arousal/Alertness: Awake/alert Orientation Level:  (unable to state name and responds "I dont know" when asked where she is or what happened) Year: Other (Comment) (unable) Month:  (unable to respond) Day of Week: Other (Comment) (unable) Memory: Impaired Memory Impairment: Storage deficit;Retrieval deficit;Decreased short term memory;Decreased recall of new information Decreased Short Term Memory: Verbal basic;Functional basic Memory Recall Sock: Not able to recall Memory Recall Blue: Not able to recall Memory Recall Bed: Not able to recall Attention: Focused;Sustained Focused Attention: Impaired Focused Attention Impairment: Verbal basic;Functional basic Sustained Attention: Impaired Awareness: Impaired Problem  Solving: Impaired Problem Solving Impairment: Verbal basic Safety/Judgment: Impaired Comments: Alzheimer's has primarily presented as short term memory loss (within the day or within a few minutes) Sensation Sensation Light Touch:  (unable to formally assess despite attempts as pt unable to follow directions) Hot/Cold: Appears Intact Proprioception: Appears Intact Coordination Fine Motor Movements are Fluid and Coordinated: Yes Finger Nose Finger Test: unable to follow directions for specific assessments, but able to manage adl tasks with adequate dexterity bilaterally Motor  Motor Motor: Abnormal postural alignment and control Motor - Skilled Clinical Observations: posterior lean in sitting and stance  Trunk/Postural Assessment  Postural  Control Postural Control: Deficits on evaluation  Balance Balance Balance Assessed: Yes Static Sitting Balance Static Sitting - Balance Support: Feet supported Static Sitting - Level of Assistance: 4: Min assist Dynamic Sitting Balance Dynamic Sitting - Level of Assistance: 4: Min assist Static Standing Balance Static Standing - Level of Assistance: 3: Mod assist Dynamic Standing Balance Dynamic Standing - Balance Support: During functional activity Dynamic Standing - Level of Assistance: 3: Mod assist Extremity/Trunk Assessment RUE Assessment RUE Assessment: Within Functional Limits LUE Assessment LUE Assessment: Within Functional Limits  Care Tool Care Tool Self Care Eating        Oral Care    Oral Care Assist Level: Minimal Assistance - Patient > 75%    Bathing   Body parts bathed by patient: Right arm;Left arm;Chest;Face Body parts bathed by helper: Right arm;Left arm;Chest;Abdomen;Front perineal area;Buttocks;Right upper leg;Left upper leg;Right lower leg;Left lower leg   Assist Level: Maximal Assistance - Patient 24 - 49%    Upper Body Dressing(including orthotics)   What is the patient wearing?: Pull over shirt;Bra    Assist Level: Maximal Assistance - Patient 25 - 49%    Lower Body Dressing (excluding footwear)   What is the patient wearing?: Underwear/pull up;Pants Assist for lower body dressing: Total Assistance - Patient < 25%    Putting on/Taking off footwear   What is the patient wearing?: Non-skid slipper socks Assist for footwear: Dependent - Patient 0%       Care Tool Toileting Toileting activity   Assist for toileting: Total Assistance - Patient < 25%     Care Tool Bed Mobility Roll left and right activity        Sit to lying activity        Lying to sitting edge of bed activity         Care Tool Transfers Sit to stand transfer        Chair/bed transfer         Toilet transfer         Care Tool Cognition Expression of Ideas and Wants Expression of Ideas and Wants: Frequent difficulty - frequently exhibits difficulty with expressing needs and ideas   Understanding Verbal and Non-Verbal Content Understanding Verbal and Non-Verbal Content: Sometimes understands - understands only basic conversations or simple, direct phrases. Frequently requires cues to understand   Memory/Recall Ability *first 3 days only Memory/Recall Ability *first 3 days only: None of the above were recalled    Refer to Care Plan for Long Term Goals  SHORT TERM GOAL WEEK 1 OT Short Term Goal 1 (Week 1): Patient will attend to grooming tasks and complete with min cues and CGA in stance OT Short Term Goal 2 (Week 1): patient will complete upper body bathing and dressing with min A OT Short Term Goal 3 (Week 1): patient will complete toileting, lower body bathing and dressing with mod A OT Short Term Goal 4 (Week 1): patient will complete sit to stand and SPT with LRAD min a  Recommendations for other services: None    Skilled Therapeutic Intervention  Patient in bed, asleep.  Husband present and able to provide baseline information.  Patient is aroused with moderate stimulation (hearing aides  in) - she seems to respond most to husbands voice.  Supine to sitting edge of bed with max A - posterior lean, she responds well to gentle guidance and tactile cues for posture and mobility.  Occ verbal responses, able to follow verbal directions less than 20%, follows gesture  directions and backward chaining for functional tasks >50%.  Evaluation completed as documented above.  She presents with balance, mobility and cognitive deficits limiting all aspects of adl.  Reviewed role of OT, plan of care, safety and goals for therapy.  Husband demonstrates good understanding.  Completed adl and mobility training with patient and husband as documented below - she uses objects such as deodorant and tooth paste appropriately when handed to her.  Poor initiation of tasks.  She remained seated in w/c at close of session, seat belt alarm set and husband present, call bell and tray table in reach.    ADL ADL Grooming: Moderate assistance Where Assessed-Grooming: Wheelchair;Sitting at sink Upper Body Bathing: Maximal assistance Where Assessed-Upper Body Bathing: Sitting at sink;Wheelchair Lower Body Bathing: Dependent Where Assessed-Lower Body Bathing: Sitting at sink;Standing at sink;Wheelchair Upper Body Dressing: Maximal assistance Where Assessed-Upper Body Dressing: Wheelchair Lower Body Dressing: Maximal assistance Where Assessed-Lower Body Dressing: Wheelchair Toileting: Dependent Where Assessed-Toileting: Other (Comment) (incontinent of bladder in brief) Mobility  Bed Mobility Bed Mobility: Supine to Sit Supine to Sit: Maximal Assistance - Patient - Patient 25-49% Transfers Sit to Stand: Moderate Assistance - Patient 50-74% Stand to Sit: Minimal Assistance - Patient > 75%   Discharge Criteria: Patient will be discharged from OT if patient refuses treatment 3 consecutive times without medical reason, if treatment goals not met, if there is a change in medical status, if patient makes no progress  towards goals or if patient is discharged from hospital.  The above assessment, treatment plan, treatment alternatives and goals were discussed and mutually agreed upon: by family  Carlos Levering 04/13/2021, 3:45 PM

## 2021-04-14 DIAGNOSIS — N390 Urinary tract infection, site not specified: Secondary | ICD-10-CM | POA: Diagnosis not present

## 2021-04-14 DIAGNOSIS — S065X2S Traumatic subdural hemorrhage with loss of consciousness of 31 minutes to 59 minutes, sequela: Secondary | ICD-10-CM | POA: Diagnosis not present

## 2021-04-14 DIAGNOSIS — G309 Alzheimer's disease, unspecified: Secondary | ICD-10-CM | POA: Diagnosis not present

## 2021-04-14 DIAGNOSIS — A499 Bacterial infection, unspecified: Secondary | ICD-10-CM | POA: Diagnosis not present

## 2021-04-14 NOTE — Progress Notes (Addendum)
PROGRESS NOTE   Subjective/Complaints: No problems overnight. Able to sleep. No pain  ROS: Limited due to cognitive/behavioral   Objective:   No results found. No results for input(s): WBC, HGB, HCT, PLT in the last 72 hours. No results for input(s): NA, K, CL, CO2, GLUCOSE, BUN, CREATININE, CALCIUM in the last 72 hours.  Intake/Output Summary (Last 24 hours) at 04/14/2021 1012 Last data filed at 04/14/2021 0800 Gross per 24 hour  Intake 1094 ml  Output --  Net 1094 ml        Physical Exam: Vital Signs Blood pressure 136/60, pulse 60, temperature 97.9 F (36.6 C), resp. rate 17, height '5\' 3"'$  (1.6 m), weight 60 kg, SpO2 97 %.  Constitutional: No distress . Vital signs reviewed. HEENT: NCAT, EOMI, oral membranes moist Neck: supple Cardiovascular: RRR without murmur. No JVD    Respiratory/Chest: CTA Bilaterally without wheezes or rales. Normal effort    GI/Abdomen: BS +, non-tender, non-distended Ext: no clubbing, cyanosis, or edema Psych: pleasant and cooperative Skin: Clean and intact without signs of breakdown Neuro:  makes eye contact. Very HOH. Difficult to assess comprehension today. Tends to speak when spoken to and when comprehends. Moves all 4's Musculoskeletal: no swelling or pain in limbs/trunk    Assessment/Plan: 1. Functional deficits which require 3+ hours per day of interdisciplinary therapy in a comprehensive inpatient rehab setting. Physiatrist is providing close team supervision and 24 hour management of active medical problems listed below. Physiatrist and rehab team continue to assess barriers to discharge/monitor patient progress toward functional and medical goals  Care Tool:  Bathing    Body parts bathed by patient: Right arm, Left arm, Chest, Face   Body parts bathed by helper: Right arm, Left arm, Chest, Abdomen, Front perineal area, Buttocks, Right upper leg, Left upper leg, Right lower  leg, Left lower leg     Bathing assist Assist Level: Maximal Assistance - Patient 24 - 49%     Upper Body Dressing/Undressing Upper body dressing   What is the patient wearing?: Pull over shirt    Upper body assist Assist Level: Maximal Assistance - Patient 25 - 49%    Lower Body Dressing/Undressing Lower body dressing      What is the patient wearing?: Underwear/pull up     Lower body assist Assist for lower body dressing: Total Assistance - Patient < 25%     Toileting Toileting    Toileting assist Assist for toileting: Total Assistance - Patient < 25%     Transfers Chair/bed transfer  Transfers assist     Chair/bed transfer assist level: Moderate Assistance - Patient 50 - 74%     Locomotion Ambulation   Ambulation assist      Assist level: Moderate Assistance - Patient 50 - 74% Assistive device: Hand held assist (R HHA) Max distance: 79f   Walk 10 feet activity   Assist     Assist level: Moderate Assistance - Patient - 50 - 74% Assistive device: Hand held assist   Walk 50 feet activity   Assist    Assist level: Moderate Assistance - Patient - 50 - 74% Assistive device: Hand held assist    Walk 150  feet activity   Assist Walk 150 feet activity did not occur: Safety/medical concerns         Walk 10 feet on uneven surface  activity   Assist Walk 10 feet on uneven surfaces activity did not occur: Safety/medical concerns         Wheelchair     Assist Will patient use wheelchair at discharge?: No (TBD)             Wheelchair 50 feet with 2 turns activity    Assist            Wheelchair 150 feet activity     Assist          Blood pressure 136/60, pulse 60, temperature 97.9 F (36.6 C), resp. rate 17, height '5\' 3"'$  (1.6 m), weight 60 kg, SpO2 97 %.  Medical Problem List and Plan: 1.  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              -patient may  shower             -ELOS/Goals: supervision to min assist goals with PT, OT, SLP  Patient is beginning CIR therapies today including PT and OT SLP  - white board in room for communication  -really needs hearing aids 2.  Antithrombotics: -DVT/anticoagulation:  Mechanical: Sequential compression devices, below knee Bilateral lower extremities             -antiplatelet therapy: N/A 3. Pain Management: Tylenol prn  -gabapentin for chronic headache 4. Mood: LCSW to follow for evaluation and support.              -antipsychotic agents: N/a 5. Neuropsych: This patient is not capable of making decisions on her own behalf. 6. Skin/Wound Care: Routine pressure relief measures.  7. Fluids/Electrolytes/Nutrition: Monitor I/O. Check CMET on 08/08             -PO intake reasonable on 8/6--continue to encourage             -suspect a lot of this due to cognition  -consider megace trial 8. Kleb pneumo UTI: S to keflex--continue x 4 days total ( in addition to 3d rocephin) 9. Alzheimer's dementia:Continue Namenda and aricept             --Zoloft for mood stabilization             -continue sleep chart 10 Hypothyroid: On supplement. 11. Acute blood loss anemia: h/H trending down--recheck CBC on 08/08. 12. Orthostatic Hypotension: Encourage fluid intake. Continue ProAmatine 5 mg tid.      LOS: 2 days A FACE TO [place] 04/14/2021, 10:12 AM

## 2021-04-15 DIAGNOSIS — S065X0D Traumatic subdural hemorrhage without loss of consciousness, subsequent encounter: Secondary | ICD-10-CM

## 2021-04-15 NOTE — Progress Notes (Signed)
PROGRESS NOTE   Subjective/Complaints:  Pt awakens to voice, very limited and delayed responses, some spont speech "you have cold hands"  ROS: Limited due to cognitive/behavioral   Objective:   No results found. No results for input(s): WBC, HGB, HCT, PLT in the last 72 hours. No results for input(s): NA, K, CL, CO2, GLUCOSE, BUN, CREATININE, CALCIUM in the last 72 hours.  Intake/Output Summary (Last 24 hours) at 04/15/2021 0923 Last data filed at 04/14/2021 1254 Gross per 24 hour  Intake 150 ml  Output --  Net 150 ml         Physical Exam: Vital Signs Blood pressure 121/66, pulse (!) 54, temperature 97.9 F (36.6 C), resp. rate 16, height '5\' 3"'$  (1.6 m), weight 60 kg, SpO2 94 %.   General: No acute distress Mood and affect are appropriate Heart: Regular rate and rhythm no rubs murmurs or extra sounds Lungs: Clear to auscultation, breathing unlabored, no rales or wheezes Abdomen: Positive bowel sounds, soft nontender to palpation, nondistended Extremities: No clubbing, cyanosis, or edema Skin: No evidence of breakdown, no evidence of rash Neurologic: Cranial nerves II through XII intact, motor strength is 5/5 in bilateral deltoid, bicep, tricep, grip, hip flexor, knee extensors, ankle dorsiflexor and plantar flexor Sensory exam cannot assess due to MS  Musculoskeletal: Full range of motion in all 4 extremities. No joint swelling    Assessment/Plan: 1. Functional deficits which require 3+ hours per day of interdisciplinary therapy in a comprehensive inpatient rehab setting. Physiatrist is providing close team supervision and 24 hour management of active medical problems listed below. Physiatrist and rehab team continue to assess barriers to discharge/monitor patient progress toward functional and medical goals  Care Tool:  Bathing    Body parts bathed by patient: Right arm, Left arm, Chest, Face   Body parts  bathed by helper: Right arm, Left arm, Chest, Abdomen, Front perineal area, Buttocks, Right upper leg, Left upper leg, Right lower leg, Left lower leg     Bathing assist Assist Level: Maximal Assistance - Patient 24 - 49%     Upper Body Dressing/Undressing Upper body dressing   What is the patient wearing?: Pull over shirt    Upper body assist Assist Level: Maximal Assistance - Patient 25 - 49%    Lower Body Dressing/Undressing Lower body dressing      What is the patient wearing?: Incontinence brief     Lower body assist Assist for lower body dressing: Total Assistance - Patient < 25%     Toileting Toileting    Toileting assist Assist for toileting: Total Assistance - Patient < 25%     Transfers Chair/bed transfer  Transfers assist     Chair/bed transfer assist level: Moderate Assistance - Patient 50 - 74%     Locomotion Ambulation   Ambulation assist      Assist level: Moderate Assistance - Patient 50 - 74% Assistive device: Hand held assist (R HHA) Max distance: 44f   Walk 10 feet activity   Assist     Assist level: Moderate Assistance - Patient - 50 - 74% Assistive device: Hand held assist   Walk 50 feet activity   Assist  Assist level: Moderate Assistance - Patient - 50 - 74% Assistive device: Hand held assist    Walk 150 feet activity   Assist Walk 150 feet activity did not occur: Safety/medical concerns         Walk 10 feet on uneven surface  activity   Assist Walk 10 feet on uneven surfaces activity did not occur: Safety/medical concerns         Wheelchair     Assist Will patient use wheelchair at discharge?: No (TBD)             Wheelchair 50 feet with 2 turns activity    Assist            Wheelchair 150 feet activity     Assist          Blood pressure 121/66, pulse (!) 54, temperature 97.9 F (36.6 C), resp. rate 16, height '5\' 3"'$  (1.6 m), weight 60 kg, SpO2 94 %.  Medical Problem  List and Plan: 1.  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              -patient may shower             -ELOS/Goals: supervision to min assist goals with PT, OT, SLP  Patient is beginning CIR therapies today including PT and OT SLP  - white board in room for communication  -really needs hearing aids 2.  Antithrombotics: -DVT/anticoagulation:  Mechanical: Sequential compression devices, below knee Bilateral lower extremities             -antiplatelet therapy: N/A 3. Pain Management: Tylenol prn  -gabapentin for chronic headache 4. Mood: LCSW to follow for evaluation and support.              -antipsychotic agents: N/a 5. Neuropsych: This patient is not capable of making decisions on her own behalf. 6. Skin/Wound Care: Routine pressure relief measures.  7. Fluids/Electrolytes/Nutrition: Monitor I/O. Check CMET on 08/08             -PO intake reasonable on 8/6--continue to encourage             -suspect a lot of this due to cognition  -consider megace trial 8. Kleb pneumo UTI: S to keflex--continue x 4 days total ( in addition to 3d rocephin) 9. Alzheimer's dementia:Continue Namenda and aricept             --Zoloft for mood stabilization             -continue sleep chart 10 Hypothyroid: On supplement. 11. Acute blood loss anemia: h/H trending down--recheck CBC on 08/08. 12. Orthostatic Hypotension: Encourage fluid intake. Continue ProAmatine 5 mg tid.   Vitals:   04/14/21 1920 04/15/21 0319  BP: (!) 101/58 121/66  Pulse: 84 (!) 54  Resp: 18 16  Temp: 98.4 F (36.9 C) 97.9 F (36.6 C)  SpO2: 99% 94%   Controlled 8/8    LOS: 3 days A FACE TO FACE EVALUATION WAS PERFORMED  Michelle [name] 04/15/2021, 9:23 AM

## 2021-04-15 NOTE — Progress Notes (Signed)
Occupational Therapy TBI Note  Patient Details  Name: Michelle [name] MRN: [id number] Date of Birth: [birth year]  Today's Date: 04/15/2021 OT Individual Time: [id number] OT Individual Time Calculation (min): 65 min    Short Term Goals: Week 1:  OT Short Term Goal 1 (Week 1): Patient will attend to grooming tasks and complete with min cues and CGA in stance OT Short Term Goal 2 (Week 1): patient will complete upper body bathing and dressing with min A OT Short Term Goal 3 (Week 1): patient will complete toileting, lower body bathing and dressing with mod A OT Short Term Goal 4 (Week 1): patient will complete sit to stand and SPT with LRAD min a  Skilled Therapeutic Interventions/Progress Updates:    Pt supine, eyes closed, easily aroused with vc. Pt responds yes/no throughout session with occasional echolalia. Mod-max verbal and tactile cueing for initiation of all commands. Mod A often provided to initiate transfer, but once pt initiated motor plan, she was able to transfer and remain standing with CGA-min A. Pt with severe deficits in attention, requiring intervention of the environment and max cueing by OT. UB bathing with max A d/t attention , poor motor planning and inability to follow commands despite backward chaining, verbal and visual cueing. Pt more successful with automatic tasks- donning shirt with set up assist with direct vc. Transfer to toilet with min A, pt sat for several minutes and then had spontaneous void. Mod A for hygiene. Pt donned pants and depends with mod A. Pt able to pull up pants in standing with automatic motor plan initiation. RN entered to administer medication, so OT left room to find posey vest for pt to have a task to occupy her hands. Chair alarm belt was fastened and pt was left sitting at the nurses desk for supervision.   Therapy Documentation Precautions:  Precautions Precautions: Fall Precaution Comments: HOH Restrictions Weight Bearing Restrictions:  No   Agitated Behavior Scale: TBI Observation Details Observation Environment: pt room Start of observation period - Date: 04/15/21 Start of observation period - Time: 0940 End of observation period - Date: 04/15/21 End of observation period - Time: 1045 Agitated Behavior Scale (DO NOT LEAVE BLANKS) Short attention span, easy distractibility, inability to concentrate: Present to a moderate degree Impulsive, impatient, low tolerance for pain or frustration: Absent Uncooperative, resistant to care, demanding: Absent Violent and/or threatening violence toward people or property: Absent Explosive and/or unpredictable anger: Absent Rocking, rubbing, moaning, or other self-stimulating behavior: Absent Pulling at tubes, restraints, etc.: Absent Wandering from treatment areas: Absent Restlessness, pacing, excessive movement: Present to a moderate degree Repetitive behaviors, motor, and/or verbal: Absent Rapid, loud, or excessive talking: Absent Sudden changes of mood: Absent Easily initiated or excessive crying and/or laughter: Absent Self-abusiveness, physical and/or verbal: Absent Agitated behavior scale total score: 18   Therapy/Group: Individual Therapy  Curtis Sites 04/15/2021, 11:08 AM

## 2021-04-15 NOTE — Progress Notes (Addendum)
Physical Therapy TBI Note  Patient Details  Name: Michelle [name] MRN: [id number] Date of Birth: 08/21/1946  Today's Date: 04/15/2021 PT Individual Time: [id number] PT Individual Time Calculation (min): 54 min   Short Term Goals: Week 1:  PT Short Term Goal 1 (Week 1): Pt will perform supine<>sit with mod assist PT Short Term Goal 2 (Week 1): Pt will perform sit<>stands with min assist PT Short Term Goal 3 (Week 1): Pt will perform bed<>chair transfers with min assist PT Short Term Goal 4 (Week 1): Pt will ambulate at least 72f using LRAD with min assist PT Short Term Goal 5 (Week 1): Pt will navigate up/down 4 steps using B HRs with min assist  Skilled Therapeutic Interventions/Progress Updates:   [facility]. Pt agreeable to PT treatment and did not appear to be in any pain Session with emphasis on functional mobility/transfers, toileting, generalized strengthening, dynamic standing balance/coordination, ambulation, NMR, motor planning/sequencing, and improved activity tolerance. Suggested pt attempt to void (as pt has timed toileting schedule) and unaware if she needed to void or not. Sit<>stand with RW and mod A with max cues for sequencing as pt nodding yes to understanding task therapist was asking but then not initiating any movement. Pt ambulated [id number] 2 trials with RW and mod A to/from bathroom with mod A to steer RW. Pt frequently pushing RW too far forward and required max verbal/tactile/visual cues to step feet forward as pt initially continuing to push RW but not moving feet. Pt required total A for clothing management but ultimately unable to void; therapist provided total A for peri-care. [facility] for time management purposes and worked on one step commands having pt clip/unclip clothespins to/from clothesline with emphasis on sustained attention, problem solving, and following commands. Pt able to clip 7  clothespins and unclip 3 with max cues and significantly increased time. Pt demonstrated significantly decreased initiation and very externally distracted by other people walking around the gym requiring max cues for attention and focus to task. Pt began closing eyes and appeared fatigued but unable to communicate to therapist if she was tired or not. [facility] and requested to stay in [facility] with pt sitting in WC, needs within reach, and seatbelt alarm on. Husband present at bedside.   Therapy Documentation Precautions:  Precautions Precautions: Fall Precaution Comments: HOH Restrictions Weight Bearing Restrictions: No  Agitated Behavior Scale: TBI Observation Details Observation Environment: pt's room Start of observation period - Date: 04/15/21 Start of observation period - Time: 1300 End of observation period - Date: 04/15/21 End of observation period - Time: 1400 Agitated Behavior Scale (DO NOT LEAVE BLANKS) Short attention span, easy distractibility, inability to concentrate: Present to a moderate degree Impulsive, impatient, low tolerance for pain or frustration: Absent Uncooperative, resistant to care, demanding: Present to a slight degree Violent and/or threatening violence toward people or property: Absent Explosive and/or unpredictable anger: Absent Rocking, rubbing, moaning, or other self-stimulating behavior: Present to a slight degree Pulling at tubes, restraints, etc.: Absent Wandering from treatment areas: Absent Restlessness, pacing, excessive movement: Present to a slight degree Repetitive behaviors, motor, and/or verbal: Present to a slight degree Rapid, loud, or excessive talking: Absent Sudden changes of mood: Absent Easily initiated or excessive crying and/or laughter: Absent Self-abusiveness, physical and/or verbal: Absent Agitated behavior scale total score: 20   Therapy/Group: Individual Therapy AnBetsey Holiday  [place], DPT   04/15/2021, 7:16 AM

## 2021-04-15 NOTE — Progress Notes (Signed)
Inpatient Rehabilitation Care Coordinator Assessment and Plan Patient Details  Name: Michelle [name] MRN: [id number] Date of Birth: 05-25-1946  Today's Date: 04/15/2021  Hospital Problems: Principal Problem:   Subdural hemorrhage following injury [facility])  Past Medical History:  Past Medical History:  Diagnosis Date   Adenomatous colon polyp    Allergy    Dementia ([place])    GERD (gastroesophageal reflux disease)    Glaucoma    History of chronic cough    Hx of hearing loss    bilateral hearing aids   Hyperlipidemia    Hypertension    IBS (irritable bowel syndrome)    Low back pain    Thyroid disease    Past Surgical History:  Past Surgical History:  Procedure Laterality Date   ABDOMINAL HYSTERECTOMY  1998   CATARACT EXTRACTION, BILATERAL Bilateral 04/2015   CHOLECYSTECTOMY  1998   COLONOSCOPY  12/10/2016   GUM SURGERY  1996 / 2013   skin graft on gums   TUBAL LIGATION  1977   Social History:  reports that she has never smoked. She has never used smokeless tobacco. She reports that she does not drink alcohol and does not use drugs.  Family / Support Systems Marital Status: Married How Long?: 32 years Patient Roles: Spouse Spouse/Significant Other: Butch (husband) Children: 2 adult daughters- Gardiner Barefoot (lives in [place]), and Abigail Butts (lives in [place]). Other Supports: None reported Anticipated Caregiver: Pt husband. Ability/Limitations of Caregiver: None reported Caregiver Availability: 24/7 Family Dynamics: Pt lives with her husband.  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 Education: high school grad. Read: Yes Write: Yes Employment Status: Retired Date Retired/Disabled/Unemployed: 2005 Age Retired: 15 Public relations account executive Issues: Husband denies Guardian/Conservator: N/A   Abuse/Neglect Abuse/Neglect Assessment Can Be Completed:  Unable to assess, patient is non-responsive or altered mental status (Pt has advance stage of dementia. Pt answered questions for pt.) Physical Abuse: Denies Verbal Abuse: Denies Sexual Abuse: Denies Exploitation of patient/patient's resources: Denies Self-Neglect: Denies  Emotional Status Pt's affect, behavior and adjustment status: Pt in good spirits at time of visit. Pt was eating lunch with NT. Recent Psychosocial Issues: N./A Psychiatric History: Pt has advanced dementia Substance Abuse History: Husband denies  Patient / Family Perceptions, Expectations & Goals Pt/Family understanding of illness & functional limitations: Pt husband has a general understanding of pt care needs. Premorbid pt/family roles/activities: Assistance with some self-care needs. Anticipated changes in roles/activities/participation: No changes Pt/family expectations/goals: Pt husband wiould like her to get full independence back to where she could complete her self-care needs with supervision.  Community Resources [employer]: None Premorbid Home Care/DME Agencies: None Transportation available at discharge: Husband  Discharge Planning Living Arrangements: Spouse/significant other Support Systems: Spouse/significant other Type of Residence: Private residence Administrator, sports: Multimedia programmer (specify) [facility] Medicare) Financial Screen Referred: No Living Expenses: Own Money Management: Spouse Does the patient have any problems obtaining your medications?: No Home Management: Husband manages all care needs Patient/Family Preliminary Plans: No Changes Care Coordinator Barriers to Discharge: Decreased caregiver support, Lack of/limited family support Care Coordinator Anticipated Follow Up Needs: HH/OP Expected length of stay: 2-2.5 weeks  Clinical Impression SW met with pt and pt husband in room to introduce self, explain role, and discuss discharge process.  Pt primary caregiver her husband.  Pt states that pt enjoys sits on the dect and watches nature (clouds, birds), watches tv, and sometimes plays on the computer (  trying to) play solitaire. Pt husband completed assessment for pt. Pt advanced care directive on file. Pt not a veteran. DME: RW, cane, high rise toilet eats, 3in1 BSC (will check and see if this is still in storage).   (Lives on [place] line. Considered [facility] resident).  Caroline Matters A Alexee Delsanto 04/15/2021, 8:50 PM

## 2021-04-15 NOTE — Care Management (Signed)
Inpatient [place] Individual Statement of Services  Patient Name:  Michelle [name]  Date:  04/15/2021  Welcome to the [facility].  Our goal is to provide you with an individualized program based on your diagnosis and situation, designed to meet your specific needs.  With this comprehensive rehabilitation program, you will be expected to participate in at least 3 hours of rehabilitation therapies Monday-Friday, with modified therapy programming on the weekends.  Your rehabilitation program will include the following services:  Physical Therapy (PT), Occupational Therapy (OT), Speech Therapy (ST), 24 hour per day rehabilitation nursing, Therapeutic Recreaction (TR), Psychology, Neuropsychology, Care Coordinator, Rehabilitation Medicine, [place], and Other  Weekly team conferences will be held on Tuesdays to discuss your progress.  Your Inpatient Rehabilitation Care Coordinator will talk with you frequently to get your input and to update you on team discussions.  Team conferences with you and your family in attendance may also be held.  Expected length of stay: 2-2.5 weeks    Overall anticipated outcome: Supervision  Depending on your progress and recovery, your program may change. Your Inpatient Rehabilitation Care Coordinator will coordinate services and will keep you informed of any changes. Your Inpatient Rehabilitation Care Coordinator's name and contact numbers are listed  below.  The following services may also be recommended but are not provided by the [facility] will be made to provide these services after discharge if needed.  Arrangements include referral to agencies that provide these services.  Your insurance has been verified to be:  [facility] Medicare  Your primary  doctor is:  Roma Schanz  Pertinent information will be shared with your doctor and your insurance company.  Inpatient Rehabilitation Care Coordinator:  Cathleen Corti [phone] or [phone]  Information discussed with and copy given to patient by: Rana Snare, 04/15/2021, 8:55 AM

## 2021-04-15 NOTE — Progress Notes (Signed)
Speech Language Pathology TBI Note  Patient Details  Name: Michelle [name] MRN: [id number] Date of Birth: February 15, 1946  Today's Date: 04/15/2021 SLP Individual Time: [id number] SLP Individual Time Calculation (min): 45 min  Short Term Goals: Week 1: SLP Short Term Goal 1 (Week 1): Pt will increase orientation to self, place, time, biographical concepts and recent medical events with max A verbal and visual cues SLP Short Term Goal 2 (Week 1): Pt will follow 1-2 step functional directions with mod A cues SLP Short Term Goal 3 (Week 1): Pt will increase focused attention to 5 minutes provided mod A cues SLP Short Term Goal 4 (Week 1): Pt will answer simple yes/no questions with 75% accuracy min A SLP Short Term Goal 5 (Week 1): Pt will demonstrate simple problem solving for functional tasks 50% of opportunities provided mod A cues  Skilled Therapeutic Interventions: Patient agreeable to skilled ST intervention with focus on cognitive-communication goals. Patient was sleeping on arrival and somewhat difficult to arouse, although became more alert after turning on the lights, elevating HOB, and using warm wash cloth on face. She required max A verbal and tactile (light touch on hand) cues to maintain attention during session with average focused attention of approximately 3-5 minute intervals. SLP facilitated feeding assistance with morning meal when patient demonstrated optimal alertness. Patient was dependent for feeding despite attempts to facilitate hand-over-hand. Patient consumed regular textures and thin liquids via straw. Tolerated well with only one instance of coughing with small sausage bite. Otherwise no overt s/sx of aspiration noted during meal. Regular diet and thin liquids continues to appear appropriate at this time. From a communication standpoint, patient only responded to yes/no questions via head nods to indicate preferences with morning meal and her personal comfort. Minimal verbal  expression consisted only of "I don't know" and "huh?", and occasional repetition of SLP's prompts. Patient was left in bed with alarm activated and immediate needs within reach at end of session. Continue per current plan of care.      Pain Pain Assessment Pain Scale: 0-10 Pain Score: 0-No pain Faces Pain Scale: No hurt PAINAD (Pain Assessment in Advanced Dementia) Breathing: normal Negative Vocalization: none Facial Expression: smiling or inexpressive Body Language: relaxed Consolability: no need to console PAINAD Score: 0  Agitated Behavior Scale: TBI Observation Details Observation Environment: pt room Start of observation period - Date: 04/15/21 Start of observation period - Time: 0800 End of observation period - Date: 04/15/21 End of observation period - Time: 0845 Agitated Behavior Scale (DO NOT LEAVE BLANKS) Short attention span, easy distractibility, inability to concentrate: Present to a moderate degree Impulsive, impatient, low tolerance for pain or frustration: Absent Uncooperative, resistant to care, demanding: Absent Violent and/or threatening violence toward people or property: Absent Explosive and/or unpredictable anger: Absent Rocking, rubbing, moaning, or other self-stimulating behavior: Absent Pulling at tubes, restraints, etc.: Absent Wandering from treatment areas: Absent Restlessness, pacing, excessive movement: Absent Repetitive behaviors, motor, and/or verbal: Absent Rapid, loud, or excessive talking: Absent Sudden changes of mood: Absent Easily initiated or excessive crying and/or laughter: Absent Self-abusiveness, physical and/or verbal: Absent Agitated behavior scale total score: 16  Therapy/Group: Individual Therapy  Patty Sermons 04/15/2021, 12:16 PM

## 2021-04-16 DIAGNOSIS — S065X0D Traumatic subdural hemorrhage without loss of consciousness, subsequent encounter: Secondary | ICD-10-CM | POA: Diagnosis not present

## 2021-04-16 NOTE — IPOC Note (Signed)
Overall Plan of Care [facility]) Patient Details Name: Michelle [name] MRN: [id number] DOB: [birth year]  Admitting Diagnosis: Subdural hemorrhage following injury [facility])  Hospital Problems: Principal Problem:   Subdural hemorrhage following injury ([place])     Functional Problem List: Nursing Behavior, Bladder, Bowel, Endurance, Medication Management, Motor, Safety  PT Balance, Safety, Behavior, Sensory, Edema, Skin Integrity, Endurance, Motor, Nutrition, Pain, Perception  OT Balance, Cognition, Endurance, Motor, Perception, Safety, Vision  SLP Cognition, Endurance, Safety  TR         Basic ADL's: OT Eating, Grooming, Bathing, Dressing, Toileting     Advanced  ADL's: OT       Transfers: PT Bed Mobility, Bed to Chair, Car, Furniture, Floor  OT Toilet, Tub/Shower     Locomotion: PT Ambulation, Stairs     Additional Impairments: OT    SLP Social Cognition   Problem Solving, Memory, Attention, Awareness  TR      Anticipated Outcomes Item Anticipated Outcome  Self Feeding set up/independent  Swallowing      Basic self-care  CS/min A  Toileting  CS   Bathroom Transfers CS/CGA  Bowel/Bladder  maintain regular voiding and BM pattern with mod asssit  Transfers  supervision using LRAD  Locomotion  supervision using LRAD  Communication  SPV communication of wants/needs  Cognition  Min A for basic cog  Pain  no pain or less than 3  Safety/Judgment  no falls, skin breakdown, infection   Therapy Plan: PT Intensity: Minimum of 1-2 x/day ,45 to 90 minutes PT Frequency: 5 out of 7 days PT Duration Estimated Length of Stay: ~2.5 weeks OT Intensity: Minimum of 1-2 x/day, 45 to 90 minutes OT Frequency: 5 out of 7 days OT Duration/Estimated Length of Stay: 2 weeks SLP Intensity: Minumum of 1-2 x/day, 30 to 90 minutes SLP Frequency: 3 to 5 out of 7 days SLP Duration/Estimated Length of Stay: 10-14 days   Due to the current state of emergency, patients may not be receiving  their 3-hours of Medicare-mandated therapy.   Team Interventions: Nursing Interventions Bladder Management, Bowel Management, Pain Management, Discharge Planning, Psychosocial Support  PT interventions Ambulation/gait training, Community reintegration, DME/adaptive equipment instruction, Neuromuscular re-education, Psychosocial support, Stair training, UE/LE Strength taining/ROM, Training and development officer, Discharge planning, Functional electrical stimulation, Pain management, Therapeutic Activities, Skin care/wound management, UE/LE Coordination activities, Cognitive remediation/compensation, Disease management/prevention, Functional mobility training, Patient/family education, Splinting/orthotics, Therapeutic Exercise, Visual/perceptual remediation/compensation  OT Interventions Balance/vestibular training, Cognitive remediation/compensation, Discharge planning, DME/adaptive equipment instruction, Functional mobility training, Patient/family education, Self Care/advanced ADL retraining, Therapeutic Activities, Therapeutic Exercise, UE/LE Strength taining/ROM, UE/LE Coordination activities, Visual/perceptual remediation/compensation  SLP Interventions Cognitive remediation/compensation, Speech/Language facilitation, Therapeutic Activities, Therapeutic Exercise, Cueing hierarchy, Functional tasks, Medication managment, Patient/family education  TR Interventions    SW/CM Interventions Discharge Planning, Psychosocial Support, Patient/Family Education   Barriers to Discharge MD  Medical stability and Lack of/limited family support  Nursing Decreased caregiver support, Home environment access/layout, Wound Care, Lack of/limited family support, Medication compliance, Behavior    PT Home environment access/layout, Incontinence    OT      SLP Other (comments) none related to ST  SW Decreased caregiver support, Lack of/limited family support     Team Discharge Planning: Destination: PT-Home ,OT-  Home , SLP-Home Projected Follow-up: PT-Outpatient PT, 24 hour supervision/assistance, OT-  Home health OT, SLP-Home Health SLP, Outpatient SLP Projected Equipment Needs: PT- , OT- To be determined, SLP-None recommended by SLP Equipment Details: PT- , OT-  Patient/family involved in discharge planning: PT- Patient, Family  member/caregiver,  OT-Family member/caregiver, SLP-Patient  MD ELOS: 10-14d Medical Rehab Prognosis:  Fair Assessment: 75 year old RH-female with history of HTN, hearing loss, idiopathic hypotension, chronic UTIs, Alzheimer's dementia with behavioral disturbance/hallucinations (followed by Dr. Delice Lesch) who was admitted to [facility] on 04/08/2021 after falling and striking her forehead followed by brief loss of consciousness.  She was confused with mumbling responses and had complaints of headache.  CT head done showing left frontal scalp swelling with left frontal SAH, left frontal IPH and SDH left frontal convexity and parafalcine and tentorial distribution.  Dr. Christella Noa consulted for input and felt no surgical intervention needed and to repeat scan as needed neurological changes.   She did have worsening of mental status on 08/03 and MRI brain done showing no change in acute subdural hemorrhage and multiple acute hemorrhagic parenchymal contusions left frontal lobe measuring up to 11 mm.  Urine culture also done showing greater than 100,000 colonies of gram-negative rods and she was started on Rocephin for treatment.  Mentation is improving with patient showing increased ability to follow commands however continues to have delayed processing, balance deficits as well as posterior lean affecting ADLs and mobility.  CIR recommended due to functional decline.      See Team Conference Notes for weekly updates to the plan of care

## 2021-04-16 NOTE — Progress Notes (Signed)
Physical Therapy TBI Note  Patient Details  Name: Michelle [name] MRN: [id number] Date of Birth: July 01, 1946  Today's Date: 04/16/2021 PT Individual Time: 0900-1000 PT Individual Time Calculation (min): 60 min   Short Term Goals: Week 1:  PT Short Term Goal 1 (Week 1): Pt will perform supine<>sit with mod assist PT Short Term Goal 2 (Week 1): Pt will perform sit<>stands with min assist PT Short Term Goal 3 (Week 1): Pt will perform bed<>chair transfers with min assist PT Short Term Goal 4 (Week 1): Pt will ambulate at least 108f using LRAD with min assist PT Short Term Goal 5 (Week 1): Pt will navigate up/down 4 steps using B HRs with min assist Week 2:    Week 3:     Skilled Therapeutic Interventions/Progress Updates:  Pt received oob in wc. Transported to gym. BP sitting 91/50, HR 61 Sit to stand w/moderate tactile cues to intitiate, min to complete. Gait: 357fx 1, 505f 1, gait interrupted when pt changed direction to walk to window and sit on mat.  Decreased clearance LLE thru swing on L, occasionally misses swing phase on L appears due to inattention/motor planning issue.  Pt tends to wander during gait and sit randomly as opportunity presents itself, in intentional manner.  BP following gait 109/60, HR 80  Sit to stand w/max cues to initiate, min assist to complete.  Gait 70f36f stairs w/HHA/mod. Ascends 4 6in and descends 8 3 in steps then repeats in reverse using 2 rails and cga, redirection at top of stsirs as pt distracted by view at window. Seated rest due to fatigue w/this x 5 min for recovery.  Gait 50ft28fod assist to intitiate, min to mod assist as above, again tends to wander, touching miscellaneous items along way.  Hands saitized following gait as she likes to rest w/hands on face, removes mask during gait. Becomes very fatigued, incontinent of bowels, sits on mat then lies down purposely but not directed by therapist.  Rests in supine w/lEs in bridge position on  mat.  Supine to sit w/max visual and tactile cues to initiate, mod assist to complete.  Pt slow to comprehend/pariticpate in task. stand pivot transfer to wc w/mod assist/cues to initiate, for sequencing, and to complete safely . Pt transported to room.  Nursing notified pt incontinent of bowels and in need of personal care as session timed out.  Therapy Documentation Precautions:  Precautions Precautions: Fall Precaution Comments: HOH Restrictions Weight Bearing Restrictions: No   Pain: Pain Assessment Pain Scale: PAINAD Pain Score: 0-No pain Faces Pain Scale: No hurt Agitated Behavior Scale: TBI Observation Details Observation Environment: gym Start of observation period - Date: 04/16/21 Start of observation period - Time: 1000 End of observation period - Date: 04/16/21 End of observation period - Time: 1100 Agitated Behavior Scale (DO NOT LEAVE BLANKS) Short attention span, easy distractibility, inability to concentrate: Present to an extreme degree Impulsive, impatient, low tolerance for pain or frustration: Absent Uncooperative, resistant to care, demanding: Absent Violent and/or threatening violence toward people or property: Absent Explosive and/or unpredictable anger: Absent Rocking, rubbing, moaning, or other self-stimulating behavior: Absent Pulling at tubes, restraints, etc.: Present to a slight degree Wandering from treatment areas: Present to a slight degree Restlessness, pacing, excessive movement: Present to a slight degree Repetitive behaviors, motor, and/or verbal: Absent Rapid, loud, or excessive talking: Absent Sudden changes of mood: Absent Easily initiated or excessive crying and/or laughter: Absent Self-abusiveness, physical and/or verbal: Absent Agitated behavior scale  total score: 20    Therapy/Group: Individual Therapy Callie Fielding, [place] 04/16/2021, 10:45 AM

## 2021-04-16 NOTE — Progress Notes (Signed)
Occupational Therapy TBI Note  Patient Details  Name: Michelle [name] MRN: [id number] Date of Birth: [birth year]  Today's Date: 04/16/2021 OT Individual Time: [id number] OT Individual Time Calculation (min): 75 min    Short Term Goals: Week 1:  OT Short Term Goal 1 (Week 1): Patient will attend to grooming tasks and complete with min cues and CGA in stance OT Short Term Goal 2 (Week 1): patient will complete upper body bathing and dressing with min A OT Short Term Goal 3 (Week 1): patient will complete toileting, lower body bathing and dressing with mod A OT Short Term Goal 4 (Week 1): patient will complete sit to stand and SPT with LRAD min a  Skilled Therapeutic Interventions/Progress Updates:    Pt received supine and sleeping soundly, requiring heavy verbal and manual cues to awaken. Max A transfer to EOB and then pt arousal increased significantly. Pt required max cueing and manual facilitation to initiate/follow commands throughout session. Mod A stand pivot to w/c and then to toilet. Pt had continent urine void. Short distance ambulatory transfer into shower with mod HHA. Pt required increased time to allow for initiation of motor planning and problem solving. Some automatic behaviors with LB bathing, but no initiation of UB. Simple, direct vc provided, accompanied with tactile and visual cues. Pt required max A for UB bathing overall. Poor focused attention to task, pt often "cleaning" shower wall despite redirection. Pt completed UB dressing with max A- only pulling over head and using shirt as a towel. Pt did show automatic initiation of LB dressing when OT brought pants to feet, lifting her legs and appropriately pushing legs through pant legs. Poor recognition of socks- trying to dry ears. Mod A transfer out of shower. Pt was set up to eat breakfast. She began closing her eyes and falling asleep. Music turned on (60's playlist for familiarity) and pt increased alertness enough to eat  breakfast. She was able to self feed after manual initiation of first 2 bites. Pt appropriately attended to breakfast, no overt s/s of aspiration. Supervision of meal handed over to NT in room. Chair alarm belt set.   Therapy Documentation Precautions:  Precautions Precautions: Fall Precaution Comments: HOH Restrictions Weight Bearing Restrictions: No  Agitated Behavior Scale: TBI Observation Details Observation Environment: pt room Start of observation period - Date: 04/16/21 Start of observation period - Time: 0730 End of observation period - Date: 04/16/21 End of observation period - Time: 0845 Agitated Behavior Scale (DO NOT LEAVE BLANKS) Short attention span, easy distractibility, inability to concentrate: Present to a moderate degree Impulsive, impatient, low tolerance for pain or frustration: Absent Uncooperative, resistant to care, demanding: Absent Violent and/or threatening violence toward people or property: Absent Explosive and/or unpredictable anger: Absent Rocking, rubbing, moaning, or other self-stimulating behavior: Absent Pulling at tubes, restraints, etc.: Absent Wandering from treatment areas: Absent Restlessness, pacing, excessive movement: Present to a moderate degree Repetitive behaviors, motor, and/or verbal: Present to a slight degree Rapid, loud, or excessive talking: Absent Sudden changes of mood: Absent Easily initiated or excessive crying and/or laughter: Absent Self-abusiveness, physical and/or verbal: Absent Agitated behavior scale total score: 19   Therapy/Group: Individual Therapy  Curtis Sites 04/16/2021, 8:37 AM

## 2021-04-16 NOTE — Progress Notes (Signed)
Speech Language Pathology TBI Note  Patient Details  Name: Michelle [name] MRN: [id number] Date of Birth: [birth year]  Today's Date: 04/16/2021 SLP Individual Time: 1330-1400 SLP Individual Time Calculation (min): 30 min and Today's Date: 04/16/2021 SLP Missed Time: 15 Minutes Missed Time Reason: Patient fatigue  Short Term Goals: Week 1: SLP Short Term Goal 1 (Week 1): Pt will increase orientation to self, place, time, biographical concepts and recent medical events with max A verbal and visual cues SLP Short Term Goal 2 (Week 1): Pt will follow 1-2 step functional directions with mod A cues SLP Short Term Goal 3 (Week 1): Pt will increase focused attention to 5 minutes provided mod A cues SLP Short Term Goal 4 (Week 1): Pt will answer simple yes/no questions with 75% accuracy min A SLP Short Term Goal 5 (Week 1): Pt will demonstrate simple problem solving for functional tasks 50% of opportunities provided mod A cues  Skilled Therapeutic Interventions: Patient received sleeping soundly in bed and required extensive verbal and tactile cues to awaken. Patient kept her eyes closed for majority of session although observed moving her hands and feet to suggest she was awake, and responded to simple yes/no questions to biographical questions and regarding comfort during 20% of attempts with verbal response or subtle head nod. Her lunch tray was untouched at bedside. Nurse tech reported multiple attempts to arouse for meal however patient unable to maintain arousal to safely consume meal. SLP utilized background music, turned on all lights, elevated HOB, and used warm wash cloth on face to increase enhance alertness. Attempts were minimally effective thus did not attempt to facilitate consumption of meal. Considering patient was known to move her hands and feet and respond occasionally to therapist, SLP played 60-70's music and encouraged patient to tap her hand to the beat. Attempts were unsuccessful with  max verbal/tactile cues. Session concluded 15 minutes early due to minimal participation and arousal/alertness. Patient was left in bed with alarm activated and immediate needs within reach at end of session. Continue per current plan of care.      Pain Pain Assessment Pain Scale: 0-10 Pain Score: 0-No pain  Agitated Behavior Scale: TBI Observation Details Observation Environment: patient room Start of observation period - Date: 04/16/21 Start of observation period - Time: 1330 End of observation period - Date: 04/16/21 End of observation period - Time: 1400 Agitated Behavior Scale (DO NOT LEAVE BLANKS) Short attention span, easy distractibility, inability to concentrate: Present to an extreme degree Impulsive, impatient, low tolerance for pain or frustration: Absent Uncooperative, resistant to care, demanding: Absent Violent and/or threatening violence toward people or property: Absent Explosive and/or unpredictable anger: Absent Rocking, rubbing, moaning, or other self-stimulating behavior: Absent Pulling at tubes, restraints, etc.: Absent Wandering from treatment areas: Absent Restlessness, pacing, excessive movement: Absent Repetitive behaviors, motor, and/or verbal: Absent Rapid, loud, or excessive talking: Absent Sudden changes of mood: Absent Easily initiated or excessive crying and/or laughter: Absent Self-abusiveness, physical and/or verbal: Absent Agitated behavior scale total score: 17  Therapy/Group: Individual Therapy  Patty Sermons 04/16/2021, 2:19 PM

## 2021-04-16 NOTE — Progress Notes (Signed)
PROGRESS NOTE   Subjective/Complaints:  Difficulty following commands even with verbal cues awake and alert no agitation   ROS: Limited due to cognitive/behavioral   Objective:   No results found. No results for input(s): WBC, HGB, HCT, PLT in the last 72 hours. No results for input(s): NA, K, CL, CO2, GLUCOSE, BUN, CREATININE, CALCIUM in the last 72 hours.  Intake/Output Summary (Last 24 hours) at 04/16/2021 0932 Last data filed at 04/15/2021 1818 Gross per 24 hour  Intake 297 ml  Output --  Net 297 ml         Physical Exam: Vital Signs Blood pressure (!) 137/54, pulse (!) 54, temperature 97.9 F (36.6 C), temperature source Oral, resp. rate 18, height '5\' 3"'$  (1.6 m), weight 60 kg, SpO2 98 %.    General: No acute distress Mood and affect are appropriate Heart: Regular rate and rhythm no rubs murmurs or extra sounds Lungs: Clear to auscultation, breathing unlabored, no rales or wheezes Abdomen: Positive bowel sounds, soft nontender to palpation, nondistended Extremities: No clubbing, cyanosis, or edema Skin: No evidence of breakdown, no evidence of rash   Neurologic: Cranial nerves II through XII intact, motor strength is 5/5 in bilateral deltoid, bicep, tricep, grip, hip flexor, knee extensors, ankle dorsiflexor and plantar flexor Sensory exam cannot assess due to MS Mood/affect no lability or agitation  Musculoskeletal: Full range of motion in all 4 extremities. No joint swelling    Assessment/Plan: 1. Functional deficits which require 3+ hours per day of interdisciplinary therapy in a comprehensive inpatient rehab setting. Physiatrist is providing close team supervision and 24 hour management of active medical problems listed below. Physiatrist and rehab team continue to assess barriers to discharge/monitor patient progress toward functional and medical goals  Care Tool:  Bathing    Body parts bathed by  patient: Face, Front perineal area, Right upper leg, Left upper leg   Body parts bathed by helper: Right arm, Left arm, Chest, Abdomen, Buttocks, Right lower leg, Left lower leg     Bathing assist Assist Level: Maximal Assistance - Patient 24 - 49%     Upper Body Dressing/Undressing Upper body dressing   What is the patient wearing?: Pull over shirt    Upper body assist Assist Level: Maximal Assistance - Patient 25 - 49%    Lower Body Dressing/Undressing Lower body dressing      What is the patient wearing?: Underwear/pull up, Pants     Lower body assist Assist for lower body dressing: Total Assistance - Patient < 25%     Toileting Toileting    Toileting assist Assist for toileting: Maximal Assistance - Patient 25 - 49%     Transfers Chair/bed transfer  Transfers assist     Chair/bed transfer assist level: Moderate Assistance - Patient 50 - 74% Chair/bed transfer assistive device: Programmer, multimedia   Ambulation assist      Assist level: Moderate Assistance - Patient 50 - 74% Assistive device: Hand held assist (R HHA) Max distance: 15f   Walk 10 feet activity   Assist     Assist level: Moderate Assistance - Patient - 50 - 74% Assistive device: Hand held assist  Walk 50 feet activity   Assist    Assist level: Moderate Assistance - Patient - 50 - 74% Assistive device: Hand held assist    Walk 150 feet activity   Assist Walk 150 feet activity did not occur: Safety/medical concerns         Walk 10 feet on uneven surface  activity   Assist Walk 10 feet on uneven surfaces activity did not occur: Safety/medical concerns         Wheelchair     Assist Will patient use wheelchair at discharge?: No (TBD)             Wheelchair 50 feet with 2 turns activity    Assist            Wheelchair 150 feet activity     Assist          Blood pressure (!) 137/54, pulse (!) 54, temperature 97.9 F (36.6  C), temperature source Oral, resp. rate 18, height '5\' 3"'$  (1.6 m), weight 60 kg, SpO2 98 %.  Medical Problem List and Plan: 1.  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              -patient may shower             -ELOS/Goals: supervision to min assist goals with PT, OT, SLP  Patient is beginning CIR therapies today including PT and OT SLP  - white board in room for communication  -really needs hearing aids- does not respond well to visual cues so not just a hearing problem  2.  Antithrombotics: -DVT/anticoagulation:  Mechanical: Sequential compression devices, below knee Bilateral lower extremities             -antiplatelet therapy: N/A 3. Pain Management: Tylenol prn  -gabapentin for chronic headache 4. Mood: LCSW to follow for evaluation and support.              -antipsychotic agents: N/a 5. Neuropsych: This patient is not capable of making decisions on her own behalf. 6. Skin/Wound Care: Routine pressure relief measures.  7. Fluids/Electrolytes/Nutrition: Monitor I/O. Check CMET on 08/08             -PO intake reasonable on 8/6--continue to encourage             -suspect a lot of this due to cognition  -consider megace trial 8. Kleb pneumo UTI: S to keflex--continue x 4 days total ( in addition to 3d rocephin) 9. Alzheimer's dementia:Continue Namenda and aricept             --Zoloft for mood stabilization             -continue sleep chart 10 Hypothyroid: On supplement. 11. Acute blood loss anemia: h/H trending down--recheck CBC on 08/08. 12. Orthostatic Hypotension: Encourage fluid intake. Continue ProAmatine 5 mg tid.   Vitals:   04/15/21 1940 04/16/21 0339  BP: (!) 124/46 (!) 137/54  Pulse: 63 (!) 54  Resp: 18 18  Temp: 98.6 F (37 C) 97.9 F (36.6 C)  SpO2: (!) 73% 98%   Controlled 8/9    LOS: 4 days A FACE TO FACE EVALUATION WAS PERFORMED  Charlett Blake 04/16/2021, 9:32 AM

## 2021-04-16 NOTE — Progress Notes (Signed)
Patient ID: Michelle [name], female   DOB: 11-04-1945, 75 y.o.   MRN: [id number]   SW met with pt husband in room to provide updates from team conference,and ELOS 2 weeks. SW will continue to provide updates after team conference.   Loralee Pacas, MSW, [place] Office: [phone] Cell: [phone] Fax: [phone]

## 2021-04-16 NOTE — Plan of Care (Signed)
  Problem: RH BOWEL ELIMINATION Goal: RH STG MANAGE BOWEL WITH ASSISTANCE Description: STG Manage Bowel with Assistance. mod Outcome: Progressing   Problem: RH BLADDER ELIMINATION Goal: RH STG MANAGE BLADDER WITH ASSISTANCE Description: STG Manage Bladder With Assistance mod Outcome: Progressing   Problem: RH SAFETY Goal: RH STG ADHERE TO SAFETY PRECAUTIONS W/ASSISTANCE/DEVICE Description: STG Adhere to Safety Precautions With Assistance/Device. Mod assist Outcome: Progressing   Problem: RH PAIN MANAGEMENT Goal: RH STG PAIN MANAGED AT OR BELOW PT'S PAIN GOAL Description: No pain or less than 4 Outcome: Progressing   Problem: RH Vision Goal: RH LTG Vision (Specify) Outcome: Progressing

## 2021-04-17 DIAGNOSIS — B9689 Other specified bacterial agents as the cause of diseases classified elsewhere: Secondary | ICD-10-CM

## 2021-04-17 DIAGNOSIS — N390 Urinary tract infection, site not specified: Secondary | ICD-10-CM

## 2021-04-17 DIAGNOSIS — G8929 Other chronic pain: Secondary | ICD-10-CM

## 2021-04-17 DIAGNOSIS — R519 Headache, unspecified: Secondary | ICD-10-CM

## 2021-04-17 DIAGNOSIS — D62 Acute posthemorrhagic anemia: Secondary | ICD-10-CM

## 2021-04-17 NOTE — Progress Notes (Signed)
PROGRESS NOTE   Subjective/Complaints: Patient seen laying in bed this morning.  No reported issues overnight.  Sleep chart not updated.  ROS: Limited due to cognition  Objective:   No results found. No results for input(s): WBC, HGB, HCT, PLT in the last 72 hours. No results for input(s): NA, K, CL, CO2, GLUCOSE, BUN, CREATININE, CALCIUM in the last 72 hours.  Intake/Output Summary (Last 24 hours) at 04/17/2021 1119 Last data filed at 04/17/2021 0839 Gross per 24 hour  Intake 240 ml  Output --  Net 240 ml         Physical Exam: Vital Signs Blood pressure 108/64, pulse (!) 54, temperature 97.7 F (36.5 C), temperature source Oral, resp. rate 18, height '5\' 3"'$  (1.6 m), weight 60 kg, SpO2 98 %. Constitutional: No distress . Vital signs reviewed. HENT: Normocephalic.  Atraumatic. Eyes: EOMI. No discharge. Cardiovascular: No JVD.  RRR. Respiratory: Normal effort.  No stridor.  Bilateral clear to auscultation. GI: Non-distended.  BS +. Skin: Warm and dry.  Intact. Psych: Nonverbal Musc: No edema in extremities.  No tenderness in extremities. Neuro: Alert  Motor: Spontaneously moving bilateral upper extremities  Assessment/Plan: 1. Functional deficits which require 3+ hours per day of interdisciplinary therapy in a comprehensive inpatient rehab setting. Physiatrist is providing close team supervision and 24 hour management of active medical problems listed below. Physiatrist and rehab team continue to assess barriers to discharge/monitor patient progress toward functional and medical goals  Care Tool:  Bathing    Body parts bathed by patient: Face, Front perineal area, Right upper leg, Left upper leg   Body parts bathed by helper: Right arm, Left arm, Chest, Abdomen, Buttocks, Right lower leg, Left lower leg     Bathing assist Assist Level: Maximal Assistance - Patient 24 - 49%     Upper Body  Dressing/Undressing Upper body dressing   What is the patient wearing?: Pull over shirt    Upper body assist Assist Level: Maximal Assistance - Patient 25 - 49%    Lower Body Dressing/Undressing Lower body dressing      What is the patient wearing?: Underwear/pull up, Pants     Lower body assist Assist for lower body dressing: Total Assistance - Patient < 25%     Toileting Toileting    Toileting assist Assist for toileting: Maximal Assistance - Patient 25 - 49%     Transfers Chair/bed transfer  Transfers assist     Chair/bed transfer assist level: Moderate Assistance - Patient 50 - 74% Chair/bed transfer assistive device: Programmer, multimedia   Ambulation assist      Assist level: Moderate Assistance - Patient 50 - 74% Assistive device: Walker-rolling Max distance: 70   Walk 10 feet activity   Assist     Assist level: Moderate Assistance - Patient - 50 - 74% Assistive device: Walker-rolling   Walk 50 feet activity   Assist    Assist level: Moderate Assistance - Patient - 50 - 74% Assistive device: Walker-rolling    Walk 150 feet activity   Assist Walk 150 feet activity did not occur: Safety/medical concerns         Walk 10 feet  on uneven surface  activity   Assist Walk 10 feet on uneven surfaces activity did not occur: Safety/medical concerns         Wheelchair     Assist Will patient use wheelchair at discharge?: No (TBD)             Wheelchair 50 feet with 2 turns activity    Assist            Wheelchair 150 feet activity     Assist          Blood pressure 108/64, pulse (!) 54, temperature 97.7 F (36.5 C), temperature source Oral, resp. rate 18, height '5\' 3"'$  (1.6 m), weight 60 kg, SpO2 98 %.  Medical Problem List and Plan: 1.  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   Continue CIR 2.  Antithrombotics: -DVT/anticoagulation:   Mechanical: Sequential compression devices, below knee Bilateral lower extremities             -antiplatelet therapy: N/A 3. Pain Management: Tylenol prn  -gabapentin for chronic headache  Appears to be controlled on 8/10 4. Mood: LCSW to follow for evaluation and support.              -antipsychotic agents: N/a 5. Neuropsych: This patient is not capable of making decisions on her own behalf. 6. Skin/Wound Care: Routine pressure relief measures.  7. Fluids/Electrolytes/Nutrition: Monitor I/Os. Check CMET on 08/08             -PO intake reasonable on 8/6--continue to encourage             -suspect a lot of this due to cognition  -consider megace trial 8. Kleb pneumo UTI: S to keflex--continue x 4 days total ( in addition to 3d rocephin), last day on 8/10 9. Alzheimer's dementia:Continue Namenda and aricept             --Zoloft for mood stabilization             -continue sleep chart, need to update 10 Hypothyroid: On supplement. 11. Acute blood loss anemia:   Hemoglobin 10.3 on 8/2, labs ordered for tomorrow 12. Orthostatic Hypotension: Encourage fluid intake. Continue ProAmatine 5 mg tid.   Vitals:   04/16/21 1933 04/17/21 0431  BP: (!) 113/57 108/64  Pulse: 70 (!) 54  Resp: 18 18  Temp: 98.7 F (37.1 C) 97.7 F (36.5 C)  SpO2: 99% 98%   Controlled on 8/10  LOS: 5 days A FACE TO FACE EVALUATION WAS PERFORMED  Michelle [name] Lorie Phenix 04/17/2021, 11:19 AM

## 2021-04-17 NOTE — Patient Care Conference (Signed)
Inpatient RehabilitationTeam Conference and Plan of Care Update Date: 04/16/2021   Time: 10:30 AM    Patient Name: Michelle [name]      Medical Record Number: [id number]  Date of Birth: 09-Dec-1945 Sex: Female         Room/Bed: 4W16C/4W16C-01 Payor Info: Payor: [employer] MEDICARE / Plan: [facility] MEDICARE / Product Type: *No Product type* /    Admit Date/Time:  04/12/2021  2:20 PM  Primary Diagnosis:  Subdural hemorrhage following injury [facility])  Hospital Problems: Principal Problem:   Subdural hemorrhage following injury [facility])    Expected Discharge Date: Expected Discharge Date:  (2 weeks)  Team Members Present: Physician leading conference: Dr. Leeroy Cha Social Worker Present: Loralee Pacas, [place] Nurse Present: Dorthula Nettles, RN PT Present: Tereasa Coop, PT OT Present: Laverle Hobby, OT SLP Present: Lillie Columbia, SLP PPS Coordinator present : Gunnar Fusi, SLP     Current Status/Progress Goal Weekly Team Focus  Bowel/Bladder   incontnent B/B  regain continence      Swallow/Nutrition/ Hydration   Tolerating Regular diet and thin liquids  WFL - No dysphagia goals set      ADL's   Mod-max A for ADLs depending on arousal and cueing, requires max cueing for attention to task, motor plan initiation and for following commands overall. Would benefit from set daily schedule and perhaps 15/7 for consistency.  (S) to min A  following commands, ADLs, transfers   Mobility   min A STS, min/mod A gait 89f with R HHA (but tends to lean forward and L with shuffled steps causing mild anterior LOB), min A 4 steps using B HRs  supervision  dynamic standing balance, gait training, family education, problem solving   Communication   Max A for expression/comprehension. Yes/no responses with head nods, very limited expressive communciation, difficulty following simple 1-step commands.  min A  Following basic commands, answering yes/no questions, functional communciation    Safety/Cognition/ Behavioral Observations  Max A. Patient with baseline deficits secondary to Alzheimer's Dementia. Spouse reports decreased alertness and attention since fall. So far patient has been limited by reduced alertness and lethargy.  Min A  Orientation, maintaining alertness, focused attention, basic problem solving and safety   Pain   no c/o pain  < 3 on a 0-10 pain scale  assess q 4hr and prn   Skin   CDI  no breakdown  assess q shift and prn     Discharge Planning:  Pt to discharge to home with her husband who is primary caregiver. Intermittent support from dtr [place] grandson.   Team Discussion: Orthostatic Hypotension, Alzheimer's dementia, Klebsiella pneumonia UTI. Incontinent B/B, no pain reported, skin CDI. Mod/max ADL's, max cueing. Supervision to min assist goals. Min/mod assist ambulation, min assist 4 steps both rails. Mod/max assist with 1 step directions. Patient on target to meet rehab goals: no, mod/max assist and requiring max cueing.  *See Care Plan and progress notes for long and short-term goals.   Revisions to Treatment Plan:  Continuing medications.  Teaching Needs: Family education, medication management, bowel/bladder management, transfer training, gait training, balance training, endurance training, stair training, safety awareness.  Current Barriers to Discharge: Decreased caregiver support, Medical stability, Home enviroment access/layout, Incontinence, Lack of/limited family support, and Medication compliance  Possible Resolutions to Barriers: Continue current medications, provide emotional support.     Medical Summary Current Status: orthostatic hypotension, Alzheimer's dementia, acute blood loss anemia, Klebsiella pneumonia UTI  Barriers to Discharge: Medical stability  Barriers to  Discharge Comments: orthostatic hypotension, Alzheimer's dementia, acute blood loss anemia, Klebsiella pneumonia UTI Possible Resolutions to [employer]  Focus: continue midodrine '5mg'$  TID, continue Namenda and Aricept, continue to monitor Hemoglobins regularly, continue Keflex   Continued Need for Acute Rehabilitation Level of Care: The patient requires daily medical management by a physician with specialized training in physical medicine and rehabilitation for the following reasons: Direction of a multidisciplinary physical rehabilitation program to maximize functional independence : Yes Medical management of patient stability for increased activity during participation in an intensive rehabilitation regime.: Yes Analysis of laboratory values and/or radiology reports with any subsequent need for medication adjustment and/or medical intervention. : Yes   I attest that I was present, lead the team conference, and concur with the assessment and plan of the team.   Cristi Loron 04/17/2021, 11:17 AM

## 2021-04-17 NOTE — Progress Notes (Signed)
Occupational Therapy TBI Note  Patient Details  Name: Michelle [name] MRN: [id number] Date of Birth: April 30, 1946  Today's Date: 04/17/2021 OT Individual Time: 1400-1500 OT Individual Time Calculation (min): 60 min    Short Term Goals: Week 1:  OT Short Term Goal 1 (Week 1): Patient will attend to grooming tasks and complete with min cues and CGA in stance OT Short Term Goal 2 (Week 1): patient will complete upper body bathing and dressing with min A OT Short Term Goal 3 (Week 1): patient will complete toileting, lower body bathing and dressing with mod A OT Short Term Goal 4 (Week 1): patient will complete sit to stand and SPT with LRAD min a  Skilled Therapeutic Interventions/Progress Updates:     Pt received in bed asleep with no verbalization of pain and no non verbal signs of pain. Focus of session on initaiton in functional context, functional transfers and ADL performance. Pt requires MAX A for mobility to EOB and MOD A to power up increased A required for pivot steps. Pt frequently closing eye and leaning forward with hands on face requiring MAX VC and up to MOD A for postural control sitting on toilet and EOB. Pt demo delayed protective responses and righting reactions. Pt demo 1 instance of attempting to bite therapist gently on arm while completing stand pivot transfer. Overall pt extremely externally distracted impacting intiation/continuation/sequencing. Pt requires total A for toileting and MAX A for UB dressing despite increased time to process short concise commands.  Pt left at end of session in bed with exit alarm on, call light in reach and all needs met   Therapy Documentation Precautions:  Precautions Precautions: Fall Precaution Comments: HOH Restrictions Weight Bearing Restrictions: No General: General PT Missed Treatment Reason: Patient fatigue Vital Signs: Therapy Vitals Temp: 98 F (36.7 C) Temp Source: Oral Pulse Rate: 66 Resp: 16 BP: (!) 115/48 Patient  Position (if appropriate): Lying Oxygen Therapy SpO2: 96 % O2 Device: Room Air Pain:   Agitated Behavior Scale: TBI  Observation Details Observation Environment: pt room Start of observation period - Date: 04/17/21 Start of observation period - Time: 1400 End of observation period - Date: 04/17/21 End of observation period - Time: 1500 Agitated Behavior Scale (DO NOT LEAVE BLANKS) Short attention span, easy distractibility, inability to concentrate: Present to an extreme degree Impulsive, impatient, low tolerance for pain or frustration: Absent Uncooperative, resistant to care, demanding: Present to a slight degree Violent and/or threatening violence toward people or property: Absent Explosive and/or unpredictable anger: Absent Rocking, rubbing, moaning, or other self-stimulating behavior: Absent Pulling at tubes, restraints, etc.: Absent Wandering from treatment areas: Absent Restlessness, pacing, excessive movement: Present to a slight degree Repetitive behaviors, motor, and/or verbal: Present to a slight degree Rapid, loud, or excessive talking: Absent Sudden changes of mood: Absent Easily initiated or excessive crying and/or laughter: Absent Self-abusiveness, physical and/or verbal: Absent Agitated behavior scale total score: 20  ADL: ADL Grooming: Moderate assistance Where Assessed-Grooming: Wheelchair, Sitting at sink Upper Body Bathing: Maximal assistance Where Assessed-Upper Body Bathing: Sitting at sink, Wheelchair Lower Body Bathing: Dependent Where Assessed-Lower Body Bathing: Sitting at sink, Standing at sink, Wheelchair Upper Body Dressing: Maximal assistance Where Assessed-Upper Body Dressing: Wheelchair Lower Body Dressing: Maximal assistance Where Assessed-Lower Body Dressing: Wheelchair Toileting: Dependent Where Assessed-Toileting: Other (Comment) (incontinent of bladder in brief) Vision   Perception    Praxis   Exercises:   Other Treatments:      Therapy/Group: Individual Therapy  Tonny Branch  04/17/2021, 4:10 PM

## 2021-04-17 NOTE — Progress Notes (Signed)
Speech Language Pathology TBI Note  Patient Details  Name: Michelle [name] MRN: [id number] Date of Birth: 1946/03/20  Today's Date: 04/17/2021 SLP Individual Time: [id number] SLP Individual Time Calculation (min): 40 min  Short Term Goals: Week 1: SLP Short Term Goal 1 (Week 1): Pt will increase orientation to self, place, time, biographical concepts and recent medical events with max A verbal and visual cues SLP Short Term Goal 2 (Week 1): Pt will follow 1-2 step functional directions with mod A cues SLP Short Term Goal 3 (Week 1): Pt will increase focused attention to 5 minutes provided mod A cues SLP Short Term Goal 4 (Week 1): Pt will answer simple yes/no questions with 75% accuracy min A SLP Short Term Goal 5 (Week 1): Pt will demonstrate simple problem solving for functional tasks 50% of opportunities provided mod A cues  Skilled Therapeutic Interventions: Skilled treatment session focused on cognitive goals. Upon arrival, patient was asleep in bed but easily awakened. However, patient frequently falling back asleep requiring constant cuing. Patient with minimal verbal expression today. Patient would answer yes/no questions in 75% of opportunities but unable to answer open-ended questions about familiar topics like her name or make a choice from a field of 2. Patient did spontaneously initiate sentence regarding the show on tv. SLP provided hand over hand assist for initiation of drinking via straw with minimal success. Patient left upright in bed with alarm on and all needs within reach. Continue with current plan of care.      Pain No indications of pain   Agitated Behavior Scale: TBI Observation Details Observation Environment: Patient's room Start of observation period - Date: 04/17/21 Start of observation period - Time: 62 End of observation period - Date: 04/17/21 End of observation period - Time: 1130 Agitated Behavior Scale (DO NOT LEAVE BLANKS) Short attention span, easy  distractibility, inability to concentrate: Present to an extreme degree Impulsive, impatient, low tolerance for pain or frustration: Absent Uncooperative, resistant to care, demanding: Absent Violent and/or threatening violence toward people or property: Absent Explosive and/or unpredictable anger: Absent Rocking, rubbing, moaning, or other self-stimulating behavior: Absent Pulling at tubes, restraints, etc.: Absent Wandering from treatment areas: Absent Restlessness, pacing, excessive movement: Absent Repetitive behaviors, motor, and/or verbal: Absent Rapid, loud, or excessive talking: Absent Sudden changes of mood: Absent Easily initiated or excessive crying and/or laughter: Absent Self-abusiveness, physical and/or verbal: Absent Agitated behavior scale total score: 17  Therapy/Group: Individual Therapy  Quincey Quesinberry 04/17/2021, 3:52 PM

## 2021-04-17 NOTE — Progress Notes (Signed)
Physical Therapy Session Note  Patient Details  Name: Michelle [name] MRN: [id number] Date of Birth: 01-10-1946  Today's Date: 04/17/2021 PT Individual Time: 0800-0900 and 1300-1332 PT Individual Time Calculation (min): 60 min and 32 min. Missed time 23' 2/2 lethargy. Short Term Goals: Week 1:  PT Short Term Goal 1 (Week 1): Pt will perform supine<>sit with mod assist PT Short Term Goal 2 (Week 1): Pt will perform sit<>stands with min assist PT Short Term Goal 3 (Week 1): Pt will perform bed<>chair transfers with min assist PT Short Term Goal 4 (Week 1): Pt will ambulate at least 73f using LRAD with min assist PT Short Term Goal 5 (Week 1): Pt will navigate up/down 4 steps using B HRs with min assist  Skilled Therapeutic Interventions/Progress Updates:   First session:  Pt presents w/ HOB elevated and NT attempting to feed breakfast.  Pt states "OK" to participating w/ therapy.  Pt performed rolling w/ mod A and then decreasing w/ rolls side to side for total care to doff soiled brief of urine and then to don pull-up and then scrubs.  Pt required manual cues for hooklying position to perform rolling.  Pt able to position R LE x 1 attempt w/ only verbal cue after LLE was positioned.  Pt transfers sup to sit w/ mod A although once manually initiated by PT, requires only min A.  Pt sat EOB to don shoes w/ Total A, but requires mod A to maintain seated balance, although maintains x 10-15 seconds at time.  Pt performed sit to stand w/ mod A and verbal cueing and pt says" OK", but still requires hands-on to initiate and Total A to pull up pants.  Pt amb multiple trials w/ RW and min A initially, but does decrease to mod A as fatigues w/ increased verbal cues for maintaining position in RW, step length and manual assist for turns.  Pt does respond to 1-step commands 40% of times.  Pt amb up to 759 including turns.  Pt returned to bed w/ min A but increased time and manual cueing to lean towards pillow,  remaining in L sidelying.  Bed alarm on and all needs in reach.  NT charting on incontinence and can attempt feeding.  Second session:  Pt presents supine in bed and asleep.  Pt very lethargic, occasionally answering me, but slow and continues to close eyes.  Pt required Total A to don shoes in supine and then encouraged to sit at EOB.  Pt states "Yes" but does not initiate movement.  Pt required mod to max A to bring LEs off bed, trying to bring them back on.  Pt required max A for supine to sit and then constant assist to maintain.  Pt transferred sit to stand w/ max to Total A w/ o AD.  Pt transferred bed <> w/c w/ max A, w/ cueing for movement.  Pt returned to bed w/ mod A after directing her to "lie down on the pillow".  Pt rolled to supine w/ min A and promptly falls asleep.  Spouse called and apprised of situation.  He states coming in soon.  Ed alarm on and all needs in reach.     Therapy Documentation Precautions:  Precautions Precautions: Fall Precaution Comments: HOH Restrictions Weight Bearing Restrictions: No General:   Vital Signs:  Pain: unable to verbally state, but occasionally appears to have pain in knees.      Therapy/Group: Individual Therapy  JLadoris Gene8/06/2021, 9:01  AM  

## 2021-04-18 LAB — CBC WITH DIFFERENTIAL/PLATELET
Abs Immature Granulocytes: 0.04 10*3/uL (ref 0.00–0.07)
Basophils Absolute: 0 10*3/uL (ref 0.0–0.1)
Basophils Relative: 0 %
Eosinophils Absolute: 0 10*3/uL (ref 0.0–0.5)
Eosinophils Relative: 1 %
HCT: 32.6 % — ABNORMAL LOW (ref 36.0–46.0)
Hemoglobin: 11.2 g/dL — ABNORMAL LOW (ref 12.0–15.0)
Immature Granulocytes: 1 %
Lymphocytes Relative: 14 %
Lymphs Abs: 1.1 10*3/uL (ref 0.7–4.0)
MCH: 30.6 pg (ref 26.0–34.0)
MCHC: 34.4 g/dL (ref 30.0–36.0)
MCV: 89.1 fL (ref 80.0–100.0)
Monocytes Absolute: 0.7 10*3/uL (ref 0.1–1.0)
Monocytes Relative: 9 %
Neutro Abs: 6 10*3/uL (ref 1.7–7.7)
Neutrophils Relative %: 75 %
Platelets: 288 10*3/uL (ref 150–400)
RBC: 3.66 MIL/uL — ABNORMAL LOW (ref 3.87–5.11)
RDW: 12.1 % (ref 11.5–15.5)
WBC: 7.9 10*3/uL (ref 4.0–10.5)
nRBC: 0 % (ref 0.0–0.2)

## 2021-04-18 NOTE — Progress Notes (Signed)
Speech Language Pathology TBI Note  Patient Details  Name: Michelle [name] MRN: [id number] Date of Birth: 1945-12-09  Today's Date: 04/18/2021 SLP Individual Time: 0715-0805 SLP Individual Time Calculation (min): 50 min  Short Term Goals: Week 1: SLP Short Term Goal 1 (Week 1): Pt will increase orientation to self, place, time, biographical concepts and recent medical events with max A verbal and visual cues SLP Short Term Goal 2 (Week 1): Pt will follow 1-2 step functional directions with mod A cues SLP Short Term Goal 3 (Week 1): Pt will increase focused attention to 5 minutes provided mod A cues SLP Short Term Goal 4 (Week 1): Pt will answer simple yes/no questions with 75% accuracy min A SLP Short Term Goal 5 (Week 1): Pt will demonstrate simple problem solving for functional tasks 50% of opportunities provided mod A cues  Skilled Therapeutic Interventions: Skilled treatment session focused on cognitive goals. Upon arrival, patient was awake in bed but intermittently closing her eyes and pulling her blanket up to her neck requiring Max verbal and tactile cues to keep her hands exposed in order to maximize initiation with self-feeding. Patient with minimal verbal output but would intermittently respond to yes/no question regarding wants/needs. Suspect responses impacted by severely impaired hearing (husband to bring hearing aids today).  Patient self-fed ~25% of her meal with SLP initially scooping food onto the fork and handing it to the patient. However, with extra time and Max verbal and tactile cues, patient initiated scoping and bringing food to her mouth. Patient required extra time for mastication due to poor bolus awareness resulting in cough X 1 with solids. Patient left upright in bed with alarm on and all needs within reach. Continue with current plan of care.      Pain Pain Assessment Pain Scale: 0-10 Pain Score: 0-No pain  Agitated Behavior Scale: TBI Observation  Details Observation Environment: Patient's room Start of observation period - Date: 04/18/21 Start of observation period - Time: 0715 End of observation period - Date: 04/18/21 End of observation period - Time: 0805 Agitated Behavior Scale (DO NOT LEAVE BLANKS) Short attention span, easy distractibility, inability to concentrate: Present to an extreme degree Impulsive, impatient, low tolerance for pain or frustration: Absent Uncooperative, resistant to care, demanding: Absent Violent and/or threatening violence toward people or property: Absent Explosive and/or unpredictable anger: Absent Rocking, rubbing, moaning, or other self-stimulating behavior: Absent Pulling at tubes, restraints, etc.: Absent Wandering from treatment areas: Absent Restlessness, pacing, excessive movement: Absent Repetitive behaviors, motor, and/or verbal: Absent Rapid, loud, or excessive talking: Absent Sudden changes of mood: Absent Easily initiated or excessive crying and/or laughter: Absent Self-abusiveness, physical and/or verbal: Absent Agitated behavior scale total score: 17  Therapy/Group: Individual Therapy  Kahlen Morais 04/18/2021, 8:39 AM

## 2021-04-18 NOTE — Progress Notes (Signed)
Physical Therapy Session Note  Patient Details  Name: Michelle [name] MRN: [id number] Date of Birth: 07-Oct-1945  Today's Date: 04/18/2021 PT Individual Time: 1000-1045 PT Individual Time Calculation (min): 45 min   Short Term Goals: Week 1:  PT Short Term Goal 1 (Week 1): Pt will perform supine<>sit with mod assist PT Short Term Goal 2 (Week 1): Pt will perform sit<>stands with min assist PT Short Term Goal 3 (Week 1): Pt will perform bed<>chair transfers with min assist PT Short Term Goal 4 (Week 1): Pt will ambulate at least 76f using LRAD with min assist PT Short Term Goal 5 (Week 1): Pt will navigate up/down 4 steps using B HRs with min assist Week 2:    Week 3:     Skilled Therapeutic Interventions/Progress Updates:   Pain:  Pt w/no indications of pain.  Treatment to tolerance.  Rest breaks and repositioning as needed.  Pt initially oob in wc received as handoff from OT.  Pt transported to hallway.  Worked on fCharity fundraiserusing shopping cart as use of RW has proven challenging for pt. Sit to stand w/max tactile cuing to intitiate task, mod assist to complete. Gait 715fx 1, 5021f 1 pushing cart w/therapist leading forward progression via propulsion of car.  Pt w/forward lean, shuffling gait which both deteriorate w/fatigue.   Overall mod assist. Stairs - ascended 4 6in and descended 8 3 in stairs w/2 rails w/max tactile cues to initiate, when turning to reverse, pt attempts to sit on stairs at bottom, second person assist to retrieve chair, pt max assist to chair due to motor planning difficulties.   stand pivot transfer chair to wc w/max cues to initiate, min assist to complete Pt transported to rehab kitchen.  Sit to stand at counter w/max cues to initiate, min assist to complete. In standing pt transferred colored fruit from counter top into overhead cabinets using R hand w/overhead and crossbody reaching incorporated, overall min assist and  max cues for attention to task, distracts herself w/wiping countertop intermittently as if cleaning.  At end of session, pt transported to room.  Pt left oob in wc w/alarm belt set and needs in reach    Therapy Documentation Precautions:  Precautions Precautions: Fall Precaution Comments: HOH Restrictions Weight Bearing Restrictions: No   Therapy/Group: Individual Therapy BarCallie FieldingT Bruno11/2022, 12:42 PM

## 2021-04-18 NOTE — Progress Notes (Signed)
PROGRESS NOTE   Subjective/Complaints: CBC reviewed  Coughing during meal with SLP, no resp distress or stridor  ROS: Limited due to cognition  Objective:   No results found. Recent Labs    04/18/21 0548  WBC 7.9  HGB 11.2*  HCT 32.6*  PLT 288   No results for input(s): NA, K, CL, CO2, GLUCOSE, BUN, CREATININE, CALCIUM in the last 72 hours.  Intake/Output Summary (Last 24 hours) at 04/18/2021 0759 Last data filed at 04/17/2021 1856 Gross per 24 hour  Intake 340 ml  Output --  Net 340 ml         Physical Exam: Vital Signs Blood pressure (!) 105/54, pulse (!) 59, temperature (!) 97.5 F (36.4 C), temperature source Oral, resp. rate 16, height '5\' 3"'$  (1.6 m), weight 60 kg, SpO2 92 %.  General: No acute distress Mood and affect are appropriate Heart: Regular rate and rhythm no rubs murmurs or extra sounds Lungs: Clear to auscultation, breathing unlabored, no rales or wheezes Abdomen: Positive bowel sounds, soft nontender to palpation, nondistended Extremities: No clubbing, cyanosis, or edema Skin: No evidence of breakdown, no evidence of rash   Neuro: Alert  Motor: Spontaneously moving bilateral upper extremities, has difficulty following commands even with visual cues   Assessment/Plan: 1. Functional deficits which require 3+ hours per day of interdisciplinary therapy in a comprehensive inpatient rehab setting. Physiatrist is providing close team supervision and 24 hour management of active medical problems listed below. Physiatrist and rehab team continue to assess barriers to discharge/monitor patient progress toward functional and medical goals  Care Tool:  Bathing    Body parts bathed by patient: Face, Front perineal area, Right upper leg, Left upper leg   Body parts bathed by helper: Right arm, Left arm, Chest, Abdomen, Buttocks, Right lower leg, Left lower leg     Bathing assist Assist Level:  Maximal Assistance - Patient 24 - 49%     Upper Body Dressing/Undressing Upper body dressing   What is the patient wearing?: Pull over shirt    Upper body assist Assist Level: Maximal Assistance - Patient 25 - 49%    Lower Body Dressing/Undressing Lower body dressing      What is the patient wearing?: Underwear/pull up, Pants     Lower body assist Assist for lower body dressing: Total Assistance - Patient < 25%     Toileting Toileting    Toileting assist Assist for toileting: Maximal Assistance - Patient 25 - 49%     Transfers Chair/bed transfer  Transfers assist     Chair/bed transfer assist level: Moderate Assistance - Patient 50 - 74% Chair/bed transfer assistive device: Programmer, multimedia   Ambulation assist      Assist level: Moderate Assistance - Patient 50 - 74% Assistive device: Walker-rolling Max distance: 70   Walk 10 feet activity   Assist     Assist level: Moderate Assistance - Patient - 50 - 74% Assistive device: Walker-rolling   Walk 50 feet activity   Assist    Assist level: Moderate Assistance - Patient - 50 - 74% Assistive device: Walker-rolling    Walk 150 feet activity   Assist Walk 150  feet activity did not occur: Safety/medical concerns         Walk 10 feet on uneven surface  activity   Assist Walk 10 feet on uneven surfaces activity did not occur: Safety/medical concerns         Wheelchair     Assist Will patient use wheelchair at discharge?: No (TBD)             Wheelchair 50 feet with 2 turns activity    Assist            Wheelchair 150 feet activity     Assist          Blood pressure (!) 105/54, pulse (!) 59, temperature (!) 97.5 F (36.4 C), temperature source Oral, resp. rate 16, height '5\' 3"'$  (1.6 m), weight 60 kg, SpO2 92 %.  Medical Problem List and Plan: 1.  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    Continue CIR 2.  Antithrombotics: -DVT/anticoagulation:  Mechanical: Sequential compression devices, below knee Bilateral lower extremities             -antiplatelet therapy: N/A 3. Pain Management: Tylenol prn  -gabapentin for chronic headache  Appears to be controlled on 8/10 4. Mood: LCSW to follow for evaluation and support.              -antipsychotic agents: N/a 5. Neuropsych: This patient is not capable of making decisions on her own behalf. 6. Skin/Wound Care: Routine pressure relief measures.  7. Fluids/Electrolytes/Nutrition: Monitor I/Os. Check CMET on 08/08             -PO intake reasonable on 8/6--continue to encourage             -suspect a lot of this due to cognition  -consider megace trial 8. Kleb pneumo UTI: S to keflex--continue x 4 days total ( in addition to 3d rocephin), completed 8/10 9. Alzheimer's dementia:Continue Namenda and aricept             --Zoloft for mood stabilization             -continue sleep chart, need to update 10 Hypothyroid: On supplement. 11. Acute blood loss anemia:   Hemoglobin 10.3 on 8/2, improving to 11.2 on 8/11 12. Orthostatic Hypotension: Encourage fluid intake. Continue ProAmatine 5 mg tid.   Vitals:   04/17/21 1937 04/18/21 0544  BP: (!) 113/55 (!) 105/54  Pulse: 64 (!) 59  Resp: 16 16  Temp: 98.5 F (36.9 C) (!) 97.5 F (36.4 C)  SpO2: 95% 92%   Controlled on 8/11  LOS: 6 days A FACE TO FACE EVALUATION WAS PERFORMED  Charlett Blake 04/18/2021, 7:59 AM

## 2021-04-18 NOTE — Plan of Care (Signed)
  Problem: RH BOWEL ELIMINATION Goal: RH STG MANAGE BOWEL WITH ASSISTANCE Description: STG Manage Bowel with Assistance. mod Outcome: Progressing   Problem: RH BLADDER ELIMINATION Goal: RH STG MANAGE BLADDER WITH ASSISTANCE Description: STG Manage Bladder With Assistance mod Outcome: Progressing   Problem: RH SAFETY Goal: RH STG ADHERE TO SAFETY PRECAUTIONS W/ASSISTANCE/DEVICE Description: STG Adhere to Safety Precautions With Assistance/Device. Mod assist Outcome: Progressing   Problem: RH PAIN MANAGEMENT Goal: RH STG PAIN MANAGED AT OR BELOW PT'S PAIN GOAL Description: No pain or less than 4 Outcome: Progressing   Problem: RH Vision Goal: RH LTG Vision (Specify) Outcome: Progressing

## 2021-04-18 NOTE — Progress Notes (Signed)
Occupational Therapy Session Note  Patient Details  Name: Michelle [name] MRN: [id number] Date of Birth: 03-20-1946  Today's Date: 04/18/2021 OT Individual Time: [id number]   &   1400-1430 OT Individual Time Calculation (min): 58 min    &   30 min   Short Term Goals: Week 1:  OT Short Term Goal 1 (Week 1): Patient will attend to grooming tasks and complete with min cues and CGA in stance OT Short Term Goal 2 (Week 1): patient will complete upper body bathing and dressing with min A OT Short Term Goal 3 (Week 1): patient will complete toileting, lower body bathing and dressing with mod A OT Short Term Goal 4 (Week 1): patient will complete sit to stand and SPT with LRAD min a  Skilled Therapeutic Interventions/Progress Updates:    AM session:   patient asleep in bed, she is aroused with min verbal/tactile stim.  Supine to sitting edge of bed with max A.  She responds "yes" to directions for change of position but does not initiate without moderate physical assistance/guidance.  Donns shoes seated edge of bed with min A to maintain seated balance and max A.   SPT to toilet max A, max A to doff brief.    She states that she has to void but does not after sitting for a number of minutes.  Dependent for hygiene.  Completed oral care, hand hygiene and washing face seated at sink in w/c with HOH to initiate.  OH shirt max A - distracted by the tags on the shirt.  Pull up and pants mod /max A - clothing management in stance max A.  Deoderant and hair combing with set up, placing items in hand and increased time.  She holds pen appropriately but does not write with directions to copy image or write name.  Hand off to PT at close of session.     PM session:   patient seated in w/c.  She is drowsy but more alert with stimulation.  To therapy gym via w/c.  SPT w/c to mat table with mod A.  Initially with posterior lean in sitting position, occ lateral lean - poor attention for body awareness.  Able to stand  with mod A - once in stance able to maintain with min A and ongoing tactile cues / gentle guidance.  She is able to toss bean bags at a target with good accuracy.  Reaches down to pick up dropped bean bag with min A.  Completed standing weight shift and stepping activities with her able to perform after visual demonstration.  SPT back to w/c and then bed with min A.  She removes shoes seated edge of bed with min A.  Sit to supine max A.  Eyes closed and asleep before I left the room.   Bed alarm set and call bell in reach.  Nurse tech aware of patient positioning.       Therapy Documentation Precautions:  Precautions Precautions: Fall Precaution Comments: HOH Restrictions Weight Bearing Restrictions: No   Therapy/Group: Individual Therapy  Carlos Levering 04/18/2021, 7:25 AM

## 2021-04-19 NOTE — Progress Notes (Signed)
Occupational Therapy Weekly Progress Note  Patient Details  Name: Michelle [name] MRN: [id number] Date of Birth: November 19, 1945  Beginning of progress report period: April 13, 2021 End of progress report period: April 19, 2021  Today's Date: 04/19/2021 OT Individual Time: 1045-1150 OT Individual Time Calculation (min): 65 min    Patient has met 0 of 4 short term goals.  Pt has made little to no progress this reporting period. Pt continues to be limited by her cognitive deficits that impact her ability to follow commands or carry out even familiar motor plans. D/t pt's baseline dementia, it is difficult to discern TBI behaviors vs worsening dementia behaviors. The team will continue to push forward and assess progress in the next reporting period to discern proper goals and next level of care.   Patient continues to demonstrate the following deficits: muscle weakness, decreased motor planning, decreased initiation, decreased attention, decreased awareness, decreased problem solving, decreased safety awareness, decreased memory, and delayed processing, and decreased sitting balance, decreased standing balance, decreased postural control, and decreased balance strategies and therefore will continue to benefit from skilled OT intervention to enhance overall performance with BADL and Reduce care partner burden.  Patient not progressing toward long term goals.  See goal revision..  Continue plan of care.  OT Short Term Goals Week 1:  OT Short Term Goal 1 (Week 1): Patient will attend to grooming tasks and complete with min cues and CGA in stance OT Short Term Goal 1 - Progress (Week 1): Not met OT Short Term Goal 2 (Week 1): patient will complete upper body bathing and dressing with min A OT Short Term Goal 2 - Progress (Week 1): Not met OT Short Term Goal 3 (Week 1): patient will complete toileting, lower body bathing and dressing with mod A OT Short Term Goal 3 - Progress (Week 1): Not met OT Short  Term Goal 4 (Week 1): patient will complete sit to stand and SPT with LRAD min a OT Short Term Goal 4 - Progress (Week 1): Not met Week 2:  OT Short Term Goal 1 (Week 2): Pt will complete 1 familiar ADL task with no more than mod cueing for initiation OT Short Term Goal 2 (Week 2): Pt will complete UB dressing with mod A consistently for 3 sessions OT Short Term Goal 3 (Week 2): Pt will initiate 50% of commands during sessions  Skilled Therapeutic Interventions/Progress Updates:    Pt recevied supine sleeping, requiring mod multimodal cues to arouse. She required max A to come to EOB, to initiate and for trunk elevation. Once EOB pt took several minutes to begin maintaining balance statically, with heavy posterior lean, before progressing to sitting with CGA. Shoes donned max A. Pt transferred to w/c with mod A, multimodal cueing required for sequencing/motor planning. She required max A for oral care at the sink. Stand pivot transfer to the toilet with mod A using grab bar. Pt incontinent of BM but finished voiding on the toilet both urine and bowel. Pt more talkative this session, sometimes appropriate, sometimes not. Pt looking/reaching into shirt pocket and OT stated "what are you hiding in your pocket" and pt stated "I can't tell you" while laughing. Pt very restless throughout session, perseveratively "cleaning" and messing with gait belt. Max A for toileting tasks. Pt with forward flexed posture throughout session requiring max A to bring back into sitting up. Pt intermittently yelling "ow" with upright posture, suspect back pain but pt unable to confirm/deny. Pt was brought via  w/c to the therapy gym to work in more familiar, automatic environment. OT put powder on counters to "make a mess" and pt eagerly began to clean up, with OT facilitating sit > stand with mod A. She maintained forward flexed posture and required min-mod A to remain upright during task. Pt was returned to her room, very  fatigued/tired with eyes closing. Pt was returned to supine in bed and left with all needs met, bed alarm set.   Therapy Documentation Precautions:  Precautions Precautions: Fall Precaution Comments: HOH Restrictions Weight Bearing Restrictions: No     Therapy/Group: Individual Therapy  Curtis Sites 04/19/2021, 12:42 PM

## 2021-04-19 NOTE — Progress Notes (Signed)
Physical Therapy Session Note  Patient Details  Name: Michelle [name] MRN: [id number] Date of Birth: August 08, 1946  Today's Date: 04/19/2021 PT Individual Time: [id number] PT Individual Time Calculation (min): 56 min   Short Term Goals: Week 1:  PT Short Term Goal 1 (Week 1): Pt will perform supine<>sit with mod assist PT Short Term Goal 2 (Week 1): Pt will perform sit<>stands with min assist PT Short Term Goal 3 (Week 1): Pt will perform bed<>chair transfers with min assist PT Short Term Goal 4 (Week 1): Pt will ambulate at least 15f using LRAD with min assist PT Short Term Goal 5 (Week 1): Pt will navigate up/down 4 steps using B HRs with min assist  Skilled Therapeutic Interventions/Progress Updates: Pt presents supine in bed and "ok" for therapy. Pt required mod A for sup to sit w/ cueing, manually and verbally and increased time.  Pt sat EOB w/ min A to maintain.  Pt required total A to thread pants over LEs, w/ occasional list to R.  Pt encouraged to remain sitting to don shoes w/ Total A.  Pt transferred sit to stand w/ mod A and verbal and manual cues for hand placement to RW.  Pt encouraged to " stand up", does complete upright posture, but then during gait, flexes trunk.  Verbal cues for foot clearance and step length and improves x 2-3 steps before returning to shuffling.  Pt amb x 15' in room to w/c w/ 1 step commands to "walk to chair" after visualizing, "sit in the chair" w/ mod A to complete turn and approach seat.  Pt wheeled to main gym for time conservation.  Pt transferred sit to stand w/ mod A and attempted clothespins , but not performing although picks out correct color when asked, place in cup, but then distracted by pins already in cup.  Pt then begins to reach into back of brief and returned to room for toileting.  Pt transfers w/ mod A from w/c to toilet and 1 step commands.  Pt continent of urine in BR w/ NT present and will chart.  Pt amb x 15' w/ RW to bed and mod A,  continued flexed posture and decreased foot clearance and step length.  Pt required manual cueing to lie on pillow after verbal cueing produces lying to foot of bed.  Bed alarm on and all needs in reach.      Therapy Documentation Precautions:  Precautions Precautions: Fall Precaution Comments: HOH Restrictions Weight Bearing Restrictions: No General:   Vital Signs: Therapy Vitals Pulse Rate: 61 BP: (!) 112/59 Oxygen Therapy SpO2: 97 % Pain:appears pain-free. Pain Assessment Pain Scale: 0-10 Pain Score: 0-No pain Mobility:       Therapy/Group: Individual Therapy  JLadoris Gene8/08/2021, 9:31 AM

## 2021-04-19 NOTE — Progress Notes (Signed)
Speech Language Pathology Weekly Progress and Session Note  Patient Details  Name: Michelle [name] MRN: [id number] Date of Birth: September 17, 1945  Beginning of progress report period: April 13, 2021 End of progress report period: April 19, 2021  Today's Date: 04/19/2021 SLP Individual Time: 0725-0810 SLP Individual Time Calculation (min): 45 min  Short Term Goals: Week 1: SLP Short Term Goal 1 (Week 1): Pt will increase orientation to self, place, time, biographical concepts and recent medical events with max A verbal and visual cues SLP Short Term Goal 1 - Progress (Week 1): Not met SLP Short Term Goal 2 (Week 1): Pt will follow 1-2 step functional directions with mod A cues SLP Short Term Goal 2 - Progress (Week 1): Met SLP Short Term Goal 3 (Week 1): Pt will increase focused attention to 5 minutes provided mod A cues SLP Short Term Goal 3 - Progress (Week 1): Not met SLP Short Term Goal 4 (Week 1): Pt will answer simple yes/no questions with 75% accuracy min A SLP Short Term Goal 4 - Progress (Week 1): Met SLP Short Term Goal 5 (Week 1): Pt will demonstrate simple problem solving for functional tasks 50% of opportunities provided mod A cues SLP Short Term Goal 5 - Progress (Week 1): Not met    New Short Term Goals: Week 2: SLP Short Term Goal 1 (Week 2): Patient will initiate functional tasks with Mod verbal and tactile cues in 75% of opportunities. SLP Short Term Goal 2 (Week 2): Patient will demonstrate sustained attention to functional and familiar tasks for 2 minutes with Max A multimodal cues. SLP Short Term Goal 3 (Week 2): Patient will follow 1-step commands with Min A multimodal cues. SLP Short Term Goal 4 (Week 2): Patient will answer basic yes/no questions in regards to wants/needs in 90% of opportunities with Min multimodal cues.  Weekly Progress Updates: Patient has made slow and inconsistent gains and has met 2 of 5 STGs this reporting period. Overall, patient remains  essentially nonverbal and will intermittently engage in conversation when answering basic yes/no questions in regards to wants/needs. Comprehension is impacted by severe hearing impairment but decreased attention and poor initiation also impact function requiring overall Mod-Max A multimodal cues for both initiation of functional tasks and verbal expression. Patient and family education ongoing. Patient would benefit from continued skilled SLP intervention to maximize her cognitive-linguistic functioning prior to discharge.      Intensity: Minumum of 1-2 x/day, 30 to 90 minutes Frequency: 3 to 5 out of 7 days Duration/Length of Stay: 10-14 days Treatment/Interventions: Cognitive remediation/compensation;Speech/Language facilitation;Therapeutic Activities;Therapeutic Exercise;Cueing hierarchy;Functional tasks;Patient/family education;Internal/external aids;Environmental controls   Daily Session  Skilled Therapeutic Interventions:  Skilled treatment session focused on cognitive goals. Upon arrival, patient was awake in bed but intermittently closing her eyes requiring Mod verbal and tactile cues for arousal and attention during self-feeding. Patient with minimal verbal output but would intermittently respond to yes/no question regarding wants/needs in ~50% of opportunities. Patient self-fed ~25% of her meal with SLP providing extra time and Max verbal and visual cues for initiation. Overall, patient appeared more alert this session compared to yesterday. Patient left upright in bed with alarm on and all needs within reach. Continue with current plan of care.       Pain No indications of pain   Therapy/Group: Individual Therapy  Maridel Pixler 04/19/2021, 6:25 AM

## 2021-04-19 NOTE — Plan of Care (Signed)
Behavioral Plan: [facility] Michelle [name]   [place] Level: V   Behavior to decrease/ eliminate:  - Lethargy  - Poor attention/initiation  Changes to environment:  -Promote sleep/wake cycle. Blinds open/lights on during the day  - Eliminate visual distractions during meals- no TV, remove tray- just plate on table -When watching TV pt enjoys the hallmark channel   Interventions: - OOB as much as possible -Hearing aid must be in her L ear  -Chair alarm when OOB -Timed toileting- pt will void when on toilet but cannot verbalize need   Recommendations for interactions with patient: - SIMPLE, DIRECT commands- and give pt time to process following command/direction -Yes/no questions  - gentle tactile cues for initiation   Attendees:  Laverle Hobby, OT Weston Anna, SLP

## 2021-04-19 NOTE — Progress Notes (Signed)
PROGRESS NOTE   Subjective/Complaints: Occasional cough when I was in room. Has hearing aids today!  ROS: Limited due to cognitive/behavioral    Objective:   No results found. Recent Labs    04/18/21 0548  WBC 7.9  HGB 11.2*  HCT 32.6*  PLT 288   No results for input(s): NA, K, CL, CO2, GLUCOSE, BUN, CREATININE, CALCIUM in the last 72 hours.  Intake/Output Summary (Last 24 hours) at 04/19/2021 1210 Last data filed at 04/18/2021 1800 Gross per 24 hour  Intake 540 ml  Output --  Net 540 ml        Physical Exam: Vital Signs Blood pressure (!) 112/59, pulse 61, temperature 98.6 F (37 C), temperature source Oral, resp. rate 18, height '5\' 3"'$  (1.6 m), weight 60 kg, SpO2 97 %.  Constitutional: No distress . Vital signs reviewed. HEENT: NCAT, EOMI, oral membranes moist Neck: supple Cardiovascular: RRR without murmur. No JVD    Respiratory/Chest: CTA Bilaterally without wheezes or rales. Normal effort    GI/Abdomen: BS +, non-tender, non-distended Ext: no clubbing, cyanosis, or edema Psych: pleasant and cooperative Skin: No evidence of breakdown, no evidence of rash Neuro: Alert  Motor: Spontaneously moving bilateral upper extremities, has difficulty following commands even with visual cues. Hard of hearing. Limited insight and awareness.   Assessment/Plan: 1. Functional deficits which require 3+ hours per day of interdisciplinary therapy in a comprehensive inpatient rehab setting. Physiatrist is providing close team supervision and 24 hour management of active medical problems listed below. Physiatrist and rehab team continue to assess barriers to discharge/monitor patient progress toward functional and medical goals  Care Tool:  Bathing    Body parts bathed by patient: Face, Front perineal area, Right upper leg, Left upper leg   Body parts bathed by helper: Right arm, Left arm, Chest, Abdomen, Buttocks, Right  lower leg, Left lower leg     Bathing assist Assist Level: Maximal Assistance - Patient 24 - 49%     Upper Body Dressing/Undressing Upper body dressing   What is the patient wearing?: Pull over shirt    Upper body assist Assist Level: Maximal Assistance - Patient 25 - 49%    Lower Body Dressing/Undressing Lower body dressing      What is the patient wearing?: Underwear/pull up, Pants     Lower body assist Assist for lower body dressing: Maximal Assistance - Patient 25 - 49%     Toileting Toileting    Toileting assist Assist for toileting: Total Assistance - Patient < 25%     Transfers Chair/bed transfer  Transfers assist     Chair/bed transfer assist level: Moderate Assistance - Patient 50 - 74% Chair/bed transfer assistive device: Programmer, multimedia   Ambulation assist      Assist level: Moderate Assistance - Patient 50 - 74% Assistive device: Walker-rolling Max distance: 15   Walk 10 feet activity   Assist     Assist level: Moderate Assistance - Patient - 50 - 74% Assistive device: Walker-rolling   Walk 50 feet activity   Assist    Assist level: Moderate Assistance - Patient - 50 - 74% Assistive device: Walker-rolling    Walk  150 feet activity   Assist Walk 150 feet activity did not occur: Safety/medical concerns         Walk 10 feet on uneven surface  activity   Assist Walk 10 feet on uneven surfaces activity did not occur: Safety/medical concerns         Wheelchair     Assist Will patient use wheelchair at discharge?: No (TBD)             Wheelchair 50 feet with 2 turns activity    Assist            Wheelchair 150 feet activity     Assist          Blood pressure (!) 112/59, pulse 61, temperature 98.6 F (37 C), temperature source Oral, resp. rate 18, height '5\' 3"'$  (1.6 m), weight 60 kg, SpO2 97 %.  Medical Problem List and Plan: 1.  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   Continue CIR PT, OT, SLP 2.  Antithrombotics: -DVT/anticoagulation:  Mechanical: Sequential compression devices, below knee Bilateral lower extremities             -antiplatelet therapy: N/A 3. Pain Management: Tylenol prn  -gabapentin for chronic headache  Appears to be controlled on 8/12 4. Mood: LCSW to follow for evaluation and support.              -antipsychotic agents: N/a 5. Neuropsych: This patient is not capable of making decisions on her own behalf. 6. Skin/Wound Care: Routine pressure relief measures.  7. Fluids/Electrolytes/Nutrition: Monitor I/Os. Check CMET on 08/08             -PO intake reasonable -continue to encourage 8. Kleb pneumo UTI: S to keflex--continue x 4 days total ( in addition to 3d rocephin), completed 8/10 9. Alzheimer's dementia:Continue Namenda and aricept             --Zoloft for mood stabilization             -continue sleep chart 10 Hypothyroid: On supplement. 11. Acute blood loss anemia:   Hemoglobin 10.3 on 8/2, improving to 11.2 on 8/11 12. Orthostatic Hypotension: Encourage fluid intake. Continue ProAmatine 5 mg tid.   Vitals:   04/19/21 0406 04/19/21 0828  BP: (!) 110/55 (!) 112/59  Pulse: (!) 55 61  Resp: 18   Temp: 98.6 F (37 C)   SpO2: 95% 97%   Controlled on 8/12  LOS: 7 days A FACE TO FACE EVALUATION WAS PERFORMED  Meredith Staggers 04/19/2021, 12:10 PM

## 2021-04-19 NOTE — Progress Notes (Signed)
Occupational Therapy Session Note  Patient Details  Name: Michelle [name] MRN: [id number] Date of Birth: Sep 11, 1945  Today's Date: 04/20/2021 OT Individual Time: 1430-1500 OT Individual Time Calculation (min): 30 min    Skilled Therapeutic Interventions/Progress Updates:    Pt greeted in bed with no s/s pain. Used yes/no questions for communication. Pts spouse, [name], assisted with all communication due to pts cognitive deficits/HOH. Note that she was wearing her hearing aides during tx. Pt was agreeable to attempt to use the restroom. Max A for supine<sit and Mod-Max A for stand pivot<w/c<toilet without AD. Note that pt requires increased time to process, motor plan, and to execute motor movements. Manual facilitation needed for weight shifting and moving feet. External distractibility also noted. Pt unable to void, though was given increased time to do so. Mod A for sit<stand in order for 2nd helper (pts husband) to assist with elevating brief. Once pt returned to the w/c she transferred back to bed in the same manner as written above. Discussed with husband importance of timed toileting at home. He verbalized understanding. Pt remained in bed with all needs within reach and bed alarm set.   Therapy Documentation Precautions:  Precautions Precautions: Fall Precaution Comments: HOH Restrictions Weight Bearing Restrictions: No Vital Signs: Therapy Vitals Temp: 98.8 F (37.1 C) Temp Source: Oral Pulse Rate: 60 Resp: 17 BP: (!) 108/53 Patient Position (if appropriate): Sitting Oxygen Therapy SpO2: 97 % O2 Device: Room Air   ADL: ADL Grooming: Moderate assistance Where Assessed-Grooming: Wheelchair, Sitting at sink Upper Body Bathing: Maximal assistance Where Assessed-Upper Body Bathing: Sitting at sink, Wheelchair Lower Body Bathing: Dependent Where Assessed-Lower Body Bathing: Sitting at sink, Standing at sink, Wheelchair Upper Body Dressing: Maximal assistance Where  Assessed-Upper Body Dressing: Wheelchair Lower Body Dressing: Maximal assistance Where Assessed-Lower Body Dressing: Wheelchair Toileting: Dependent Where Assessed-Toileting: Other (Comment) (incontinent of bladder in brief)     Therapy/Group: Individual Therapy  Freda Jaquith A Mahkayla Preece 04/20/2021, 4:15 PM

## 2021-04-20 NOTE — Progress Notes (Addendum)
Physical Therapy TBI Note  Patient Details  Name: Michelle [name] MRN: [id number] Date of Birth: 1945/10/21  Today's Date: 04/20/2021 PT Individual Time:  1007-1050  PT Individual Time Calculation (min): 42 min    Short Term Goals: Week 1:  PT Short Term Goal 1 (Week 1): Pt will perform supine<>sit with mod assist PT Short Term Goal 2 (Week 1): Pt will perform sit<>stands with min assist PT Short Term Goal 3 (Week 1): Pt will perform bed<>chair transfers with min assist PT Short Term Goal 4 (Week 1): Pt will ambulate at least 59f using LRAD with min assist PT Short Term Goal 5 (Week 1): Pt will navigate up/down 4 steps using B HRs with min assist   Skilled Therapeutic Interventions/Progress Updates:  Patient semireclined in bed with hands to face on entrance to room. Patient alert and agreeable to PT session via head nodding on introduction. Patient with no indication of pain during session. Hearing aid applied to L ear.   Therapeutic Activity: Bed Mobility: Patient requires extensive encouragement to initiate transition to EOB. Pt performed supine --> sit with Max A. Sitting balance requires Min A to maintain as pt moves to lean to R side consistently. Attempts for pt to reach to targets of water cup as well as stuffed animal with minimal interest. Attempts for simple cues  unsuccessful at interesting pt in reaching task for balance challenge. VC/ tc required for initiation and technique throughout. At end of session, pt guided in bridging and is able to lift hips with simple cues. Performs x10 overall.  Transfers: Patient performed STS with Mod/ Max A and max encouragement and cues. Pt unwilling to maintain standing and returns to sit EOB. Extensive encouragement to stand again with pt refusing with minimal shake of head.   Patient supine in bed with hands to face at end of session with brakes locked, bed alarm set, and all needs within reach.     Therapy Documentation Precautions:   Precautions Precautions: Fall Precaution Comments: HOH Restrictions Weight Bearing Restrictions: No  Agitated Behavior Scale: TBI Observation Details Observation Environment: Patient's Room Start of observation period - Date: 04/19/21 Start of observation period - Time: 1000 End of observation period - Date: 04/19/21 End of observation period - Time: 1045 Agitated Behavior Scale (DO NOT LEAVE BLANKS) Short attention span, easy distractibility, inability to concentrate: Present to a moderate degree Impulsive, impatient, low tolerance for pain or frustration: Absent Uncooperative, resistant to care, demanding: Present to a slight degree Violent and/or threatening violence toward people or property: Absent Explosive and/or unpredictable anger: Absent Rocking, rubbing, moaning, or other self-stimulating behavior: Absent Pulling at tubes, restraints, etc.: Absent Wandering from treatment areas: Absent Restlessness, pacing, excessive movement: Absent Repetitive behaviors, motor, and/or verbal: Absent Rapid, loud, or excessive talking: Absent Sudden changes of mood: Absent Easily initiated or excessive crying and/or laughter: Absent Self-abusiveness, physical and/or verbal: Absent Agitated behavior scale total score: 17   Therapy/Group: Individual Therapy  JAlger SimonsPT, DPT 04/20/2021, 7:56 AM

## 2021-04-20 NOTE — Progress Notes (Signed)
Physical Therapy Weekly Progress Note  Patient Details  Name: Michelle [name] MRN: [id number] Date of Birth: 10/22/1945  Beginning of progress report period: April 13, 2021 End of progress report period: April 20, 2021   Pt's progress assessed from visit and treatment session notes since evaluation. See separate note for treatment session.   Patient has met 1 of 5 short term goals.  Pt is slowly making progress toward all short term goals as well as long term goals. Cognition plays a roll in barrier to progress. Continues to require overall Mod A for all mobility. Has ambulated up to 60 feet with Mod A.   Patient continues to demonstrate the following deficits muscle weakness, decreased cardiorespiratoy endurance, unbalanced muscle activation and decreased motor planning, ,, decreased initiation, decreased attention, decreased awareness, decreased problem solving, decreased safety awareness, decreased memory, and delayed processing, and decreased sitting balance, decreased standing balance, decreased postural control, and decreased balance strategies and therefore will continue to benefit from skilled PT intervention to increase functional independence with mobility.  Patient progressing toward long term goals..  Continue plan of care.  PT Short Term Goals Week 1:  PT Short Term Goal 1 (Week 1): Pt will perform supine<>sit with mod assist PT Short Term Goal 1 - Progress (Week 1): Met PT Short Term Goal 2 (Week 1): Pt will perform sit<>stands with min assist PT Short Term Goal 2 - Progress (Week 1): Progressing toward goal PT Short Term Goal 3 (Week 1): Pt will perform bed<>chair transfers with min assist PT Short Term Goal 3 - Progress (Week 1): Progressing toward goal PT Short Term Goal 4 (Week 1): Pt will ambulate at least 75ft using LRAD with min assist PT Short Term Goal 4 - Progress (Week 1): Progressing toward goal PT Short Term Goal 5 (Week 1): Pt will navigate up/down 4 steps using  B HRs with min assist PT Short Term Goal 5 - Progress (Week 1): Progressing toward goal Week 2:  PT Short Term Goal 1 (Week 2): Pt will perform supine<>sit with Min assist PT Short Term Goal 2 (Week 2): Pt will perform sit<>stands with min assist PT Short Term Goal 3 (Week 2): Pt will perform bed<>chair transfers with min assist PT Short Term Goal 4 (Week 2): Pt will ambulate at least 75ft using LRAD with min assist PT Short Term Goal 5 (Week 2): Pt will navigate up/down 4 steps using B HRs with min assist  Skilled Therapeutic Interventions/Progress Updates:  Ambulation/gait training;Community reintegration;DME/adaptive equipment instruction;Neuromuscular re-education;Psychosocial support;Stair training;UE/LE Strength taining/ROM;Balance/vestibular training;Discharge planning;Functional electrical stimulation;Pain management;Therapeutic Activities;Skin care/wound management;UE/LE Coordination activities;Cognitive remediation/compensation;Disease management/prevention;Functional mobility training;Patient/family education;Splinting/orthotics;Therapeutic Exercise;Visual/perceptual remediation/compensation    Therapy Documentation Precautions:  Precautions Precautions: Fall Precaution Comments: HOH Restrictions Weight Bearing Restrictions: No  Therapy/Group: Individual Therapy   A  PT, DPT 04/20/2021, 7:57 AM  

## 2021-04-21 NOTE — Progress Notes (Signed)
Speech Language Pathology TBI Note  Patient Details  Name: Michelle [name] MRN: [id number] Date of Birth: Mar 05, 1946  Today's Date: 04/21/2021 SLP Individual Time: 0800-0830 SLP Individual Time Calculation (min): 30 min  Short Term Goals: Week 2: SLP Short Term Goal 1 (Week 2): Patient will initiate functional tasks with Mod verbal and tactile cues in 75% of opportunities. SLP Short Term Goal 2 (Week 2): Patient will demonstrate sustained attention to functional and familiar tasks for 2 minutes with Max A multimodal cues. SLP Short Term Goal 3 (Week 2): Patient will follow 1-step commands with Min A multimodal cues. SLP Short Term Goal 4 (Week 2): Patient will answer basic yes/no questions in regards to wants/needs in 90% of opportunities with Min multimodal cues.  Skilled Therapeutic Interventions:  Pt was seen for skilled ST targeting cognitive goals.  Upon arrival, pt was soundly asleep and only briefly awakened to voice and light touch.  SLP attempted environmental and postural modifications and put in pt's hearing aids to improve pt's alertness with no appreciable changes noted.  Cold compresses were also ineffective for improving alertness.  SLP attempted sitting pt at the edge of bed to further work towards improving alertness; however, once pt's legs were over the side of the bed pt continued attempting to lay down and was repositioned back in bed for safety.  Pt did indicate that she wanted the TV on  when asked so she was left in bed sitting upright with pillow positioned behind lower back and TV on; however, even with pt's favorite channel on and in a fully upright position she remained soundly asleep.  As a result session was ended early and pt was left in bed with bed alarm set and call bell within reach.  Continue per current plan of care.    Pain Pain Assessment Pain Scale: 0-10 Pain Score: 0-No pain  Agitated Behavior Scale: TBI Observation Details Observation Environment:  pt's room Start of observation period - Date: 04/21/21 Start of observation period - Time: 0800 End of observation period - Date: 04/21/21 End of observation period - Time: 0830 Agitated Behavior Scale (DO NOT LEAVE BLANKS) Short attention span, easy distractibility, inability to concentrate: Present to a moderate degree Impulsive, impatient, low tolerance for pain or frustration: Absent Uncooperative, resistant to care, demanding: Absent Violent and/or threatening violence toward people or property: Absent Explosive and/or unpredictable anger: Absent Rocking, rubbing, moaning, or other self-stimulating behavior: Absent Pulling at tubes, restraints, etc.: Absent Wandering from treatment areas: Absent Restlessness, pacing, excessive movement: Absent Repetitive behaviors, motor, and/or verbal: Absent Rapid, loud, or excessive talking: Absent Sudden changes of mood: Absent Easily initiated or excessive crying and/or laughter: Absent Self-abusiveness, physical and/or verbal: Absent Agitated behavior scale total score: 16  Therapy/Group: Individual Therapy  Nirvan Laban, Selinda Orion 04/21/2021, 8:36 AM

## 2021-04-21 NOTE — Progress Notes (Signed)
PROGRESS NOTE   Subjective/Complaints: Patient seen laying in bed this morning.  No reported issues overnight.  ROS: Limited due to cognition  Objective:   No results found. No results for input(s): WBC, HGB, HCT, PLT in the last 72 hours.  No results for input(s): NA, K, CL, CO2, GLUCOSE, BUN, CREATININE, CALCIUM in the last 72 hours.  Intake/Output Summary (Last 24 hours) at 04/21/2021 1945 Last data filed at 04/21/2021 1836 Gross per 24 hour  Intake 120 ml  Output --  Net 120 ml         Physical Exam: Vital Signs Blood pressure (!) 96/54, pulse 62, temperature 98.2 F (36.8 C), temperature source Oral, resp. rate 17, height '5\' 3"'$  (1.6 m), weight 61.5 kg, SpO2 96 %. Constitutional: No distress . Vital signs reviewed. HENT: Normocephalic.  Atraumatic. Eyes: EOMI. No discharge. Cardiovascular: No JVD.  RRR. Respiratory: Normal effort.  No stridor.  Bilateral clear to auscultation. GI: Non-distended.  BS +. Skin: Warm and dry.  Intact. Psych: Normal mood.  Normal behavior. Musc: No edema in extremities.  No tenderness in extremities. Neuro: Alert Motor: Spontaneously moving bilateral upper extremities, has difficulty following commands even with visual cues.  HOH  Limited insight and awareness.   Assessment/Plan: 1. Functional deficits which require 3+ hours per day of interdisciplinary therapy in a comprehensive inpatient rehab setting. Physiatrist is providing close team supervision and 24 hour management of active medical problems listed below. Physiatrist and rehab team continue to assess barriers to discharge/monitor patient progress toward functional and medical goals  Care Tool:  Bathing    Body parts bathed by patient: Face, Front perineal area, Right upper leg, Left upper leg   Body parts bathed by helper: Right arm, Left arm, Chest, Abdomen, Buttocks, Right lower leg, Left lower leg     Bathing  assist Assist Level: Maximal Assistance - Patient 24 - 49%     Upper Body Dressing/Undressing Upper body dressing   What is the patient wearing?: Pull over shirt    Upper body assist Assist Level: Maximal Assistance - Patient 25 - 49%    Lower Body Dressing/Undressing Lower body dressing      What is the patient wearing?: Underwear/pull up, Pants     Lower body assist Assist for lower body dressing: Maximal Assistance - Patient 25 - 49%     Toileting Toileting    Toileting assist Assist for toileting: Maximal Assistance - Patient 25 - 49%     Transfers Chair/bed transfer  Transfers assist     Chair/bed transfer assist level: Moderate Assistance - Patient 50 - 74% Chair/bed transfer assistive device: Programmer, multimedia   Ambulation assist      Assist level: Moderate Assistance - Patient 50 - 74% Assistive device: Walker-rolling Max distance: 15   Walk 10 feet activity   Assist     Assist level: Moderate Assistance - Patient - 50 - 74% Assistive device: Walker-rolling   Walk 50 feet activity   Assist    Assist level: Moderate Assistance - Patient - 50 - 74% Assistive device: Walker-rolling    Walk 150 feet activity   Assist Walk 150 feet activity  did not occur: Safety/medical concerns         Walk 10 feet on uneven surface  activity   Assist Walk 10 feet on uneven surfaces activity did not occur: Safety/medical concerns         Wheelchair     Assist Will patient use wheelchair at discharge?: No (TBD)             Wheelchair 50 feet with 2 turns activity    Assist            Wheelchair 150 feet activity     Assist          Blood pressure (!) 96/54, pulse 62, temperature 98.2 F (36.8 C), temperature source Oral, resp. rate 17, height '5\' 3"'$  (1.6 m), weight 61.5 kg, SpO2 96 %.  Medical Problem List and Plan: 1.  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   Continue CIR 2.  Antithrombotics: -DVT/anticoagulation:  Mechanical: Sequential compression devices, below knee Bilateral lower extremities             -antiplatelet therapy: N/A 3. Pain Management: Tylenol prn  -gabapentin for chronic headache  Appears to be controlled on 8/14 4. Mood: LCSW to follow for evaluation and support.              -antipsychotic agents: N/a 5. Neuropsych: This patient is not capable of making decisions on her own behalf. 6. Skin/Wound Care: Routine pressure relief measures.  7. Fluids/Electrolytes/Nutrition: Monitor I/Os.             -PO intake reasonable -continue to encourage  BMP ordered for tomorrow 8. Kleb pneumo UTI: S to keflex--continue x 4 days total ( in addition to 3d rocephin), completed 8/10 9. Alzheimer's dementia:Continue Namenda and aricept             --Zoloft for mood stabilization             -continue sleep chart 10 Hypothyroid: On supplement. 11. Acute blood loss anemia:   Hemoglobin 11.2 on 8/11 12. Orthostatic Hypotension: Encourage fluid intake. Continue ProAmatine 5 mg tid.   Vitals:   04/21/21 1310 04/21/21 1934  BP: (!) 89/49 (!) 96/54  Pulse: (!) 59 62  Resp: 16 17  Temp: 98.6 F (37 C) 98.2 F (36.8 C)  SpO2: 98% 96%   Soft on 8/14  LOS: 9 days A FACE TO FACE EVALUATION WAS PERFORMED  Jaiyah Beining Lorie Phenix 04/21/2021, 7:45 PM

## 2021-04-22 LAB — CBC
HCT: 34.7 % — ABNORMAL LOW (ref 36.0–46.0)
Hemoglobin: 11.5 g/dL — ABNORMAL LOW (ref 12.0–15.0)
MCH: 29.1 pg (ref 26.0–34.0)
MCHC: 33.1 g/dL (ref 30.0–36.0)
MCV: 87.8 fL (ref 80.0–100.0)
Platelets: 326 10*3/uL (ref 150–400)
RBC: 3.95 MIL/uL (ref 3.87–5.11)
RDW: 12.1 % (ref 11.5–15.5)
WBC: 8.5 10*3/uL (ref 4.0–10.5)
nRBC: 0 % (ref 0.0–0.2)

## 2021-04-22 LAB — BASIC METABOLIC PANEL
Anion gap: 6 (ref 5–15)
BUN: 18 mg/dL (ref 8–23)
CO2: 26 mmol/L (ref 22–32)
Calcium: 9.1 mg/dL (ref 8.9–10.3)
Chloride: 104 mmol/L (ref 98–111)
Creatinine, Ser: 0.79 mg/dL (ref 0.44–1.00)
GFR, Estimated: >60 mL/min (ref >60)
Glucose, Bld: 99 mg/dL (ref 70–99)
Potassium: 3.7 mmol/L (ref 3.5–5.1)
Sodium: 136 mmol/L (ref 135–145)

## 2021-04-22 NOTE — Progress Notes (Signed)
Speech Language Pathology TBI Note  Patient Details  Name: Michelle [name] MRN: [id number] Date of Birth: [birth year]  Today's Date: 04/22/2021 SLP Individual Time: 1205-1250 SLP Individual Time Calculation (min): 45 min  Short Term Goals: Week 2: SLP Short Term Goal 1 (Week 2): Patient will initiate functional tasks with Mod verbal and tactile cues in 75% of opportunities. SLP Short Term Goal 2 (Week 2): Patient will demonstrate sustained attention to functional and familiar tasks for 2 minutes with Max A multimodal cues. SLP Short Term Goal 3 (Week 2): Patient will follow 1-step commands with Min A multimodal cues. SLP Short Term Goal 4 (Week 2): Patient will answer basic yes/no questions in regards to wants/needs in 90% of opportunities with Min multimodal cues.  Skilled Therapeutic Interventions: Skilled treatment session focused on cognitive goals. Upon arrival, patient was asleep in the recliner. SLP provided Max verbal and tactile cues as well as provided an upright position in order to maximize arousal. Once awake, patient was more verbally responsive and demonstrated increased social engagement with SLP compared to previous sessions. Patient required initial Max A multimodal cues for initiation of self-feeding but then continued on with task for ~20 minutes with overall Mod verbal cues.  Patient then appeared to start falling back asleep despite Max multimodal cues, therefore, meal was discontinued. NT made aware. Patient left upright in recliner with alarm on and all needs within reach. Continue with current plan of care.      Pain No/Denies Pain   Agitated Behavior Scale: TBI Observation Details Observation Environment: patient's room Start of observation period - Date: 04/22/21 Start of observation period - Time: 1205 End of observation period - Date: 04/22/21 End of observation period - Time: 1250 Agitated Behavior Scale (DO NOT LEAVE BLANKS) Short attention span, easy  distractibility, inability to concentrate: Present to a moderate degree Impulsive, impatient, low tolerance for pain or frustration: Absent Uncooperative, resistant to care, demanding: Absent Violent and/or threatening violence toward people or property: Absent Explosive and/or unpredictable anger: Absent Rocking, rubbing, moaning, or other self-stimulating behavior: Absent Pulling at tubes, restraints, etc.: Absent Wandering from treatment areas: Absent Restlessness, pacing, excessive movement: Absent Repetitive behaviors, motor, and/or verbal: Absent Rapid, loud, or excessive talking: Absent Sudden changes of mood: Absent Easily initiated or excessive crying and/or laughter: Absent Self-abusiveness, physical and/or verbal: Absent Agitated behavior scale total score: 16  Therapy/Group: Individual Therapy  Sheronica Corey, [place] 04/22/2021, 1:29 PM

## 2021-04-22 NOTE — Progress Notes (Signed)
PROGRESS NOTE   Subjective/Complaints: Pt in bed. NT helping to feed her. She denies pain or other issues.   ROS: Limited due to cognitive/behavioral   Objective:   No results found. Recent Labs    04/22/21 0652  WBC 8.5  HGB 11.5*  HCT 34.7*  PLT 326   Recent Labs    04/22/21 0652  NA 136  K 3.7  CL 104  CO2 26  GLUCOSE 99  BUN 18  CREATININE 0.79  CALCIUM 9.1    Intake/Output Summary (Last 24 hours) at 04/22/2021 1139 Last data filed at 04/21/2021 2300 Gross per 24 hour  Intake 240 ml  Output --  Net 240 ml        Physical Exam: Vital Signs Blood pressure (!) 101/50, pulse (!) 58, temperature 97.9 F (36.6 C), resp. rate 17, height '5\' 3"'$  (1.6 m), weight 61.5 kg, SpO2 94 %. Constitutional: No distress . Vital signs reviewed. HEENT: NCAT, EOMI, oral membranes moist Neck: supple Cardiovascular: RRR without murmur. No JVD    Respiratory/Chest: CTA Bilaterally without wheezes or rales. Normal effort    GI/Abdomen: BS +, non-tender, non-distended Ext: no clubbing, cyanosis, or edema Psych: pleasant and cooperative  Skin: intact Musc: No edema in extremities.  No tenderness in extremities. Neuro: Alert Motor: Spontaneously moving bilateral upper extremities. Makes good eye contact. When she hears does respond to verbal cues and answers basic questions.  HOH  Limited insight and awareness.   Assessment/Plan: 1. Functional deficits which require 3+ hours per day of interdisciplinary therapy in a comprehensive inpatient rehab setting. Physiatrist is providing close team supervision and 24 hour management of active medical problems listed below. Physiatrist and rehab team continue to assess barriers to discharge/monitor patient progress toward functional and medical goals  Care Tool:  Bathing    Body parts bathed by patient: Face, Front perineal area, Right upper leg, Left upper leg   Body parts  bathed by helper: Right arm, Left arm, Chest, Abdomen, Buttocks, Right lower leg, Left lower leg     Bathing assist Assist Level: Maximal Assistance - Patient 24 - 49%     Upper Body Dressing/Undressing Upper body dressing   What is the patient wearing?: Pull over shirt    Upper body assist Assist Level: Maximal Assistance - Patient 25 - 49%    Lower Body Dressing/Undressing Lower body dressing      What is the patient wearing?: Underwear/pull up, Pants     Lower body assist Assist for lower body dressing: Maximal Assistance - Patient 25 - 49%     Toileting Toileting    Toileting assist Assist for toileting: Maximal Assistance - Patient 25 - 49%     Transfers Chair/bed transfer  Transfers assist     Chair/bed transfer assist level: Moderate Assistance - Patient 50 - 74% Chair/bed transfer assistive device: Programmer, multimedia   Ambulation assist      Assist level: Moderate Assistance - Patient 50 - 74% Assistive device: Walker-rolling Max distance: 15   Walk 10 feet activity   Assist     Assist level: Moderate Assistance - Patient - 50 - 74% Assistive device: Walker-rolling  Walk 50 feet activity   Assist    Assist level: Moderate Assistance - Patient - 50 - 74% Assistive device: Walker-rolling    Walk 150 feet activity   Assist Walk 150 feet activity did not occur: Safety/medical concerns         Walk 10 feet on uneven surface  activity   Assist Walk 10 feet on uneven surfaces activity did not occur: Safety/medical concerns         Wheelchair     Assist Will patient use wheelchair at discharge?: No (TBD)             Wheelchair 50 feet with 2 turns activity    Assist            Wheelchair 150 feet activity     Assist          Blood pressure (!) 101/50, pulse (!) 58, temperature 97.9 F (36.6 C), resp. rate 17, height '5\' 3"'$  (1.6 m), weight 61.5 kg, SpO2 94 %.  Medical Problem List  and Plan: 1.  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   Continue CIR PT, OT, SLP 2.  Antithrombotics: -DVT/anticoagulation:  Mechanical: Sequential compression devices, below knee Bilateral lower extremities             -antiplatelet therapy: N/A 3. Pain Management: Tylenol prn  -gabapentin for chronic headache  Appears to be controlled on 8/15 4. Mood: LCSW to follow for evaluation and support.              -antipsychotic agents: N/a 5. Neuropsych: This patient is not capable of making decisions on her own behalf. 6. Skin/Wound Care: Routine pressure relief measures.  7. Fluids/Electrolytes/Nutrition: Monitor I/Os.             -PO intake reasonable -continue to encourage  BMP 8/15 reviewed and is WNL 8. Kleb pneumo UTI: S to keflex--continue x 4 days total ( in addition to 3d rocephin), completed 8/10 9. Alzheimer's dementia:Continue Namenda and aricept             --Zoloft for mood stabilization             -continue sleep chart 10 Hypothyroid: On supplement. 11. Acute blood loss anemia:   Hemoglobin 11.5 on 8/15 12. Orthostatic Hypotension: Encourage fluid intake. Continue ProAmatine 5 mg tid.   Vitals:   04/21/21 1934 04/22/21 0516  BP: (!) 96/54 (!) 101/50  Pulse: 62 (!) 58  Resp: 17 17  Temp: 98.2 F (36.8 C) 97.9 F (36.6 C)  SpO2: 96% 94%   Soft on 8/15. No changes. Continue midodrine  LOS: 10 days A FACE TO FACE EVALUATION WAS PERFORMED  Meredith Staggers 04/22/2021, 11:39 AM

## 2021-04-22 NOTE — Progress Notes (Signed)
Physical Therapy TBI Note  Patient Details  Name: Michelle [name] MRN: [id number] Date of Birth: [birth year]  Today's Date: 04/22/2021 PT Individual Time: 0900-0930 PT Individual Time Calculation (min): 30 min  PT Amount of Missed Time (min): 30 Minutes PT Missed Treatment Reason: Patient unwilling to participate  Short Term Goals: Week 2:  PT Short Term Goal 1 (Week 2): Pt will perform supine<>sit with Min assist PT Short Term Goal 2 (Week 2): Pt will perform sit<>stands with min assist PT Short Term Goal 3 (Week 2): Pt will perform bed<>chair transfers with min assist PT Short Term Goal 4 (Week 2): Pt will ambulate at least 70f using LRAD with min assist PT Short Term Goal 5 (Week 2): Pt will navigate up/down 4 steps using B HRs with min assist  Skilled Therapeutic Interventions/Progress Updates:    Pt received seated in bed handed off from NT having just finished breakfast. No appearance or indications of pain. Encouraged patient to don pants and use the bathroom this morning. Pt is max A to don pants at bed level, dependent to don tennis shoes. Supine to sit with mod A for trunk elevation. Pt unable to remain seated EOB with posterior pushing noted. Attempted pt to sit up to EOB multiple times with mod A and encouraged pt to stand to RW and/or try to use the bathroom. Pt lays back down every time after sitting up, unable to encourage to remain upright. Pt laying down in bed with eyes closed, unable to be encouraged to participate further in session. Pt left semi-reclined in bed with needs in reach. Pt missed 30 min of scheduled therapy session due to refusal to participate.  Therapy Documentation Precautions:  Precautions Precautions: Fall Precaution Comments: HOH Restrictions Weight Bearing Restrictions: No General: PT Amount of Missed Time (min): 30 Minutes PT Missed Treatment Reason: Patient unwilling to participate Pain: Pain Assessment Pain Scale: Faces Pain Score: 0-No  pain Agitated Behavior Scale: TBI Observation Details Observation Environment: pt's room Start of observation period - Date: 04/22/21 Start of observation period - Time: 0900 End of observation period - Date: 04/22/21 End of observation period - Time: 0930 Agitated Behavior Scale (DO NOT LEAVE BLANKS) Short attention span, easy distractibility, inability to concentrate: Present to a moderate degree Impulsive, impatient, low tolerance for pain or frustration: Absent Uncooperative, resistant to care, demanding: Present to a moderate degree Violent and/or threatening violence toward people or property: Absent Explosive and/or unpredictable anger: Absent Rocking, rubbing, moaning, or other self-stimulating behavior: Absent Pulling at tubes, restraints, etc.: Absent Wandering from treatment areas: Absent Restlessness, pacing, excessive movement: Absent Repetitive behaviors, motor, and/or verbal: Absent Rapid, loud, or excessive talking: Absent Sudden changes of mood: Absent Easily initiated or excessive crying and/or laughter: Absent Self-abusiveness, physical and/or verbal: Absent Agitated behavior scale total score: 18      Therapy/Group: Individual Therapy   TExcell Seltzer PT, DPT, CSRS  04/22/2021, 9:44 AM

## 2021-04-22 NOTE — Progress Notes (Signed)
Occupational Therapy TBI Note  Patient Details  Name: Michelle [name] MRN: [id number] Date of Birth: September 18, 1945  Today's Date: 04/22/2021 OT Individual Time: 1030-1120 OT Individual Time Calculation (min): 50 min    Short Term Goals: Week 2:  OT Short Term Goal 1 (Week 2): Pt will complete 1 familiar ADL task with no more than mod cueing for initiation OT Short Term Goal 2 (Week 2): Pt will complete UB dressing with mod A consistently for 3 sessions OT Short Term Goal 3 (Week 2): Pt will initiate 50% of commands during sessions  Skilled Therapeutic Interventions/Progress Updates:    Pt supine with eyes open, no indications of pain. Odor of BM present. Pt required max multimodal cues to initiate transfer to EOB and max A overall. Once EOB she required direct, short commands with visual cues and extended processing time to follow ~25% of commands at best. Mod A transfer to the w/c. Mod A transfer to toilet with better recognition of transfer familiarity. Pt had no further void but incontinent BM. Total A for peri hygiene. Spontaneous speech throughout session, sometimes appropriate- for example after wiping pt and showing her washcloth she stated "ew!". Pt returned to w/c. Total A for LB bathing with no initiation or attempts to participate. Some recognition of motor plans with pt moving feet correctly as OT threaded into pants. Mod A to stand for LB dressing. Better recognition and automaticity with applying deodorant, requiring only min cueing for apwplying to her R arm. Backward chaining was successful in donning shirt, mod A overall. Pt was left sitting up in the recliner with all needs met, chair alarm set.   Therapy Documentation Precautions:  Precautions Precautions: Fall Precaution Comments: HOH Restrictions Weight Bearing Restrictions: No    Agitated Behavior Scale: TBI Observation Details Observation Environment: pt room Start of observation period - Date: 04/22/21 Start of  observation period - Time: 1030 End of observation period - Date: 04/22/21 End of observation period - Time: 1130 Agitated Behavior Scale (DO NOT LEAVE BLANKS) Short attention span, easy distractibility, inability to concentrate: Present to a moderate degree Impulsive, impatient, low tolerance for pain or frustration: Absent Uncooperative, resistant to care, demanding: Present to a moderate degree Violent and/or threatening violence toward people or property: Absent Explosive and/or unpredictable anger: Absent Rocking, rubbing, moaning, or other self-stimulating behavior: Absent Pulling at tubes, restraints, etc.: Absent Wandering from treatment areas: Absent Restlessness, pacing, excessive movement: Absent Repetitive behaviors, motor, and/or verbal: Absent Rapid, loud, or excessive talking: Absent Sudden changes of mood: Absent Easily initiated or excessive crying and/or laughter: Absent Self-abusiveness, physical and/or verbal: Absent Agitated behavior scale total score: 18   Therapy/Group: Individual Therapy  Curtis Sites 04/22/2021, 12:30 PM

## 2021-04-22 NOTE — Plan of Care (Signed)
  Problem: RH BOWEL ELIMINATION Goal: RH STG MANAGE BOWEL WITH ASSISTANCE Description: STG Manage Bowel with Assistance. mod Outcome: Progressing   Problem: RH BLADDER ELIMINATION Goal: RH STG MANAGE BLADDER WITH ASSISTANCE Description: STG Manage Bladder With Assistance mod Outcome: Progressing   Problem: RH SAFETY Goal: RH STG ADHERE TO SAFETY PRECAUTIONS W/ASSISTANCE/DEVICE Description: STG Adhere to Safety Precautions With Assistance/Device. Mod assist Outcome: Progressing   Problem: RH PAIN MANAGEMENT Goal: RH STG PAIN MANAGED AT OR BELOW PT'S PAIN GOAL Description: No pain or less than 4 Outcome: Progressing   Problem: RH Vision Goal: RH LTG Vision (Specify) Outcome: Progressing

## 2021-04-22 NOTE — Progress Notes (Signed)
Slept good last night. Incontinent of B & B. Doesn't call for assistance, needs anticipated by staff. Left hearing aid in place and right aid in hearing aid container. Takes aides out and will lay on chest or in bed. Michelle [name]

## 2021-04-23 NOTE — Plan of Care (Signed)
  Problem: RH BOWEL ELIMINATION Goal: RH STG MANAGE BOWEL WITH ASSISTANCE Description: STG Manage Bowel with Assistance. mod Outcome: Progressing   Problem: RH BLADDER ELIMINATION Goal: RH STG MANAGE BLADDER WITH ASSISTANCE Description: STG Manage Bladder With Assistance mod Outcome: Progressing   Problem: RH SAFETY Goal: RH STG ADHERE TO SAFETY PRECAUTIONS W/ASSISTANCE/DEVICE Description: STG Adhere to Safety Precautions With Assistance/Device. Mod assist Outcome: Progressing   Problem: RH PAIN MANAGEMENT Goal: RH STG PAIN MANAGED AT OR BELOW PT'S PAIN GOAL Description: No pain or less than 4 Outcome: Progressing   Problem: RH Vision Goal: RH LTG Vision (Specify) Outcome: Progressing

## 2021-04-23 NOTE — Patient Care Conference (Signed)
Inpatient RehabilitationTeam Conference and Plan of Care Update Date: 04/23/2021   Time: 10:00 AM    Patient Name: Hardy Record Number: [id number]  Date of Birth: 1946/02/05 Sex: Female         Room/Bed: 4W16C/4W16C-01 Payor Info: Payor: [employer] MEDICARE / Plan: [facility] MEDICARE / Product Type: *No Product type* /    Admit Date/Time:  04/12/2021  2:20 PM  Primary Diagnosis:  Subdural hemorrhage following injury [facility])  Hospital Problems: Principal Problem:   Subdural hemorrhage following injury ([place]) Active Problems:   Acute blood loss anemia   UTI due to Klebsiella species   Chronic nonintractable headache    Expected Discharge Date: Expected Discharge Date: 04/26/21  Team Members Present: Physician leading conference: Dr. Alger Simons Social Worker Present: Loralee Pacas, [place] Nurse Present: Dorthula Nettles, RN PT Present: Apolinar Junes, PT OT Present: Laverle Hobby, OT SLP Present: Weston Anna, SLP PPS Coordinator present : Gunnar Fusi, SLP     Current Status/Progress Goal Weekly Team Focus  Bowel/Bladder   incontinent of B&B  regain continence  time toileting   Swallow/Nutrition/ Hydration             ADL's   mod-max A overall. Max multimodcal cueing. No real progress, sometimes pt is more alert.  (S) to min A  following commands, cognitive retraining, automatic motor plans, ADLs   Mobility   mod-max A overall, resistant to performing mobility with therapy yesterday  supervision  activity tolerance, functional mobility, gait and stair training, attention, participation, balance, patient/caregiver education.   Communication   Max A for expression, will answer to yes/no questions, need assist following commands  min A  following basic commands, functional communication to express basic wants/needs   Safety/Cognition/ Behavioral Observations  Max A  Min A  attention and initiation with functional and familiar tasks   Pain    without complaint of pain  <2 on faces pain scale  assess every 4 hours and prn   Skin   sacrum red, blanchable  no breakdown  assess q shift and with each incontinence episode     Discharge Planning:  Pt to discharge to home with her husband who is primary caregiver. Intermittent support from dtr Mitchell and grandson.   Team Discussion: Medically fairly stable, need to push PO intake, baseline dementia, and very HOH. Incontinent B/B, confused, no pain reported, skin CDI. Very little progress with OT, requires max cueing. Behavior is dementia like. Mod/max assist overall. If automatic response patient will initiate otherwise, lots of cueing. SLP reports she was more alert yesterday. Lots of cueing for initiation.  Patient on target to meet rehab goals: no, behavior is dementia like, not a lot of carryover.  *See Care Plan and progress notes for long and short-term goals.   Revisions to Treatment Plan:  BP medication adjustment.  Teaching Needs: Family education, medication management, transfer training, gait training, balance training, endurance training, safety awareness.  Current Barriers to Discharge: Decreased caregiver support, Medical stability, Home enviroment access/layout, Incontinence, Lack of/limited family support, Medication compliance, Behavior, and Nutritional means  Possible Resolutions to Barriers: Continue current medications, provide emotional support.     Medical Summary Current Status: TBI with hx of dementia, HOH. slow cognitive progress. pushing po. bp's soft but stable  Barriers to Discharge: Medical stability   Possible Resolutions to [employer] Focus: daily assessment of pt data, labs. bp medication adjustment   Continued Need for Acute Rehabilitation Level of Care:  The patient requires daily medical management by a physician with specialized training in physical medicine and rehabilitation for the following reasons: Direction of a multidisciplinary  physical rehabilitation program to maximize functional independence : Yes Medical management of patient stability for increased activity during participation in an intensive rehabilitation regime.: Yes Analysis of laboratory values and/or radiology reports with any subsequent need for medication adjustment and/or medical intervention. : Yes   I attest that I was present, lead the team conference, and concur with the assessment and plan of the team.   Cristi Loron 04/23/2021, 1:11 PM

## 2021-04-23 NOTE — Progress Notes (Signed)
Physical Therapy TBI Note  Patient Details  Name: Michelle [name] MRN: [id number] Date of Birth: 06/25/1946  Today's Date: 04/23/2021 PT Individual Time: 0816-0850 PT Individual Time Calculation (min): 34 min   Short Term Goals: Week 2:  PT Short Term Goal 1 (Week 2): Pt will perform supine<>sit with Min assist PT Short Term Goal 2 (Week 2): Pt will perform sit<>stands with min assist PT Short Term Goal 3 (Week 2): Pt will perform bed<>chair transfers with min assist PT Short Term Goal 4 (Week 2): Pt will ambulate at least 77f using LRAD with min assist PT Short Term Goal 5 (Week 2): Pt will navigate up/down 4 steps using B HRs with min assist  Skilled Therapeutic Interventions/Progress Updates:    Patient in supine and sleeping soundly.  Time to awaken patient and place hearing aides in supine.  Patient supine to sit with max A as not initiating.  Seated EOB initially max A for balance due to retropulsion.  Finally able to sit with S and NT in to assist with donning pants and shoes at EOB.  Patient sit to stand with mod A and ambulated with RW and mod A to bathroom x 12'.  Assisted for clothing management.  Stand to sit increased time and min to mod A for safety.  Patient ambulated with RW and mod to min a to recliner with A for walker management and balance/postural stability.  Patient in recliner positioned for upright posture.  NT in the room to assist with feeding patient breakfast prior to next session so patient missed 11 minutes skilled PT.   Therapy Documentation Precautions:  Precautions Precautions: Fall Precaution Comments: HOH Restrictions Weight Bearing Restrictions: No General: PT Amount of Missed Time (min): 11 Minutes PT Missed Treatment Reason: Nursing care (feeding breakfast) Pain: Pain Assessment Pain Scale: Faces Faces Pain Scale: No hurt Agitated Behavior Scale: TBI Observation Details Observation Environment: patient's room Start of observation period -  Date: 04/23/21 Start of observation period - Time: 0816 End of observation period - Date: 04/23/21 End of observation period - Time: 0850 Agitated Behavior Scale (DO NOT LEAVE BLANKS) Short attention span, easy distractibility, inability to concentrate: Present to a moderate degree Impulsive, impatient, low tolerance for pain or frustration: Absent Uncooperative, resistant to care, demanding: Present to a slight degree Violent and/or threatening violence toward people or property: Absent Explosive and/or unpredictable anger: Absent Rocking, rubbing, moaning, or other self-stimulating behavior: Present to a moderate degree Pulling at tubes, restraints, etc.: Absent Wandering from treatment areas: Absent Restlessness, pacing, excessive movement: Absent Repetitive behaviors, motor, and/or verbal: Present to a slight degree Rapid, loud, or excessive talking: Absent Sudden changes of mood: Absent Easily initiated or excessive crying and/or laughter: Absent Self-abusiveness, physical and/or verbal: Absent Agitated behavior scale total score: 20     Therapy/Group: Individual Therapy  CReginia NaasCMagda Kiel PT 04/23/2021, 8:52 AM

## 2021-04-23 NOTE — Discharge Instructions (Signed)
Inpatient Rehab Discharge Instructions  Michelle [name] Discharge date and time: No discharge date for patient encounter.   Activities/Precautions/ Functional Status: Activity: activity as tolerated Diet: regular diet Wound Care: Routine skin checks Functional status:  ___ No restrictions     ___ Walk up steps independently ___ 24/7 supervision/assistance   ___ Walk up steps with assistance ___ Intermittent supervision/assistance  ___ Bathe/dress independently ___ Walk with walker     __x_ Bathe/dress with assistance ___ Walk Independently    ___ Shower independently ___ Walk with assistance    ___ Shower with assistance ___ No alcohol     ___ Return to work/school ________  Special Instructions: No driving smoking or alcohol   My questions have been answered and I understand these instructions. I will adhere to these goals and the provided educational materials after my discharge from the hospital.  Patient/Caregiver Signature _______________________________ Date __________  Clinician Signature _______________________________________ Date __________  Please bring this form and your medication list with you to all your follow-up doctor's appointments.

## 2021-04-23 NOTE — Progress Notes (Signed)
Occupational Therapy TBI Note  Patient Details  Name: BRENNA FRIESENHAHN MRN: [id number] Date of Birth: 1946-07-08  Today's Date: 04/23/2021 OT Individual Time: 1406-1500 OT Individual Time Calculation (min): 54 min    Short Term Goals: Week 2:  OT Short Term Goal 1 (Week 2): Pt will complete 1 familiar ADL task with no more than mod cueing for initiation OT Short Term Goal 2 (Week 2): Pt will complete UB dressing with mod A consistently for 3 sessions OT Short Term Goal 3 (Week 2): Pt will initiate 50% of commands during sessions  Skilled Therapeutic Interventions/Progress Updates:    Pt in recliner with no indications of pain, both hands on face, resting. Pt taken to the bathroom, stand pivot transfers at mod-max A throughout session d/t cognitive deficits impacting initiation. Once pt is on her feet she can often complete transfers with only min A. Deficits in motor planning, attention, and sequencing greatly impact her ability to complete ADLs. No urine void but incontinence brief is heavily saturated with urine. Asked pt if she wanted to shower and she responded "sure why not". Min A ambulatory transfer into the shower. Bathing with max-total A overall as pt could not attend to task and was perseveratively washing the walls of the shower. Pt donned shirt and pants with max A. Automatic combing of hair when placed in front of the sink. Pt required max multimodal cueing and max A to brush teeth. Mod A to pants, with pt initiating threading over feet. Pt transferred back to the recliner and was left sitting up with all needs met, chair alarm set.   Therapy Documentation Precautions:  Precautions Precautions: Fall Precaution Comments: HOH Restrictions Weight Bearing Restrictions: No  Agitated Behavior Scale: TBI Observation Details Observation Environment: PT room Start of observation period - Date: 04/23/21 Start of observation period - Time: 1400 End of observation period - Date:  04/23/21 End of observation period - Time: 1500 Agitated Behavior Scale (DO NOT LEAVE BLANKS) Short attention span, easy distractibility, inability to concentrate: Present to a moderate degree Impulsive, impatient, low tolerance for pain or frustration: Absent Uncooperative, resistant to care, demanding: Absent Violent and/or threatening violence toward people or property: Absent Explosive and/or unpredictable anger: Absent Rocking, rubbing, moaning, or other self-stimulating behavior: Absent Pulling at tubes, restraints, etc.: Absent Wandering from treatment areas: Absent Restlessness, pacing, excessive movement: Absent Repetitive behaviors, motor, and/or verbal: Absent Rapid, loud, or excessive talking: Absent Sudden changes of mood: Absent Easily initiated or excessive crying and/or laughter: Absent Self-abusiveness, physical and/or verbal: Absent Agitated behavior scale total score: 16   Therapy/Group: Individual Therapy  Curtis Sites 04/23/2021, 3:09 PM

## 2021-04-23 NOTE — Progress Notes (Signed)
Physical Therapy Session Note  Patient Details  Name: SALY HOSKIN MRN: [id number] Date of Birth: [birth year]  Today's Date: 04/23/2021 PT Individual Time: 0902-0958 PT Individual Time Calculation (min): 56 min   Short Term Goals: Week 2:  PT Short Term Goal 1 (Week 2): Pt will perform supine<>sit with Min assist PT Short Term Goal 2 (Week 2): Pt will perform sit<>stands with min assist PT Short Term Goal 3 (Week 2): Pt will perform bed<>chair transfers with min assist PT Short Term Goal 4 (Week 2): Pt will ambulate at least 70f using LRAD with min assist PT Short Term Goal 5 (Week 2): Pt will navigate up/down 4 steps using B HRs with min assist  Skilled Therapeutic Interventions/Progress Updates:     Pt received seated in recliner. Nods head agreement to therapy but unclear if pt aware of situation. No apparent pain. PT cues for sit to stand [facility], pushing backward in recliner. PT provides redirection and multiple cues and eventually pt assists with transfer, requiring minA/modA to complete. WC transport to gym for time management. Pt cues to initiate ambulation and again pt resists. PT uses "beach music" to engage pt and pt begins tapping toes to music. Sit to stand achieved with modA. Pt ambulates x80' with modA due to shuffling gait pattern and flexing forward at trunk significantly. PT then "dances" with pt, working on weight shifting, side steps, backward steps, and dynamic standing balance. Pt eventually requires modA/maxA to [facility] with transitional movements and following direction.  Following extended seated rest break, pt performs sit to stand with improved sequencing and initiation, only requiring minA. Pt ambulates x20' with modA due to shuffling and forward flexion. Pt transitions to ascending 4 steps with bilateral hand rails and minA, then descends x8 3" steps with same assist, but requiring modA [facility]' [facility]   WC transport back to room. Stand pivot transfer to recliner with modA. Left seated with alarm intact and all needs within reach.    Therapy Documentation Precautions:  Precautions Precautions: Fall Precaution Comments: HOH Restrictions Weight Bearing Restrictions: No    Therapy/Group: Individual Therapy  WBreck Coons PT, DPT 04/23/2021, 9:33 AM

## 2021-04-23 NOTE — Progress Notes (Signed)
PROGRESS NOTE   Subjective/Complaints: No new issues this morning. Was just awakening when I came in.   ROS: limited due to language/communication   Objective:   No results found. Recent Labs    04/22/21 0652  WBC 8.5  HGB 11.5*  HCT 34.7*  PLT 326   Recent Labs    04/22/21 0652  NA 136  K 3.7  CL 104  CO2 26  GLUCOSE 99  BUN 18  CREATININE 0.79  CALCIUM 9.1   No intake or output data in the 24 hours ending 04/23/21 1347       Physical Exam: Vital Signs Blood pressure 115/61, pulse (!) 59, temperature 98.9 F (37.2 C), temperature source Axillary, resp. rate 14, height '5\' 3"'$  (1.6 m), weight 61.5 kg, SpO2 95 %. Constitutional: No distress . Vital signs reviewed. HEENT: NCAT, EOMI, oral membranes moist Neck: supple Cardiovascular: RRR without murmur. No JVD    Respiratory/Chest: CTA Bilaterally without wheezes or rales. Normal effort    GI/Abdomen: BS +, non-tender, non-distended Ext: no clubbing, cyanosis, or edema Psych: flat Skin: intact Musc: No edema in extremities.  No tenderness in extremities. Neuro: Alert Motor: Spontaneously moving bilateral upper extremities. Makes good eye contact. When she hears does respond to verbal cues and answers basic questions.  HOH but able to communicate when both hearing aids are in Limited insight and awareness.   Assessment/Plan: 1. Functional deficits which require 3+ hours per day of interdisciplinary therapy in a comprehensive inpatient rehab setting. Physiatrist is providing close team supervision and 24 hour management of active medical problems listed below. Physiatrist and rehab team continue to assess barriers to discharge/monitor patient progress toward functional and medical goals  Care Tool:  Bathing    Body parts bathed by patient: Face, Front perineal area, Right upper leg, Left upper leg   Body parts bathed by helper: Right arm, Left arm,  Chest, Abdomen, Buttocks, Right lower leg, Left lower leg     Bathing assist Assist Level: Maximal Assistance - Patient 24 - 49%     Upper Body Dressing/Undressing Upper body dressing   What is the patient wearing?: Pull over shirt    Upper body assist Assist Level: Maximal Assistance - Patient 25 - 49%    Lower Body Dressing/Undressing Lower body dressing      What is the patient wearing?: Underwear/pull up, Pants     Lower body assist Assist for lower body dressing: Maximal Assistance - Patient 25 - 49%     Toileting Toileting    Toileting assist Assist for toileting: Maximal Assistance - Patient 25 - 49%     Transfers Chair/bed transfer  Transfers assist     Chair/bed transfer assist level: Moderate Assistance - Patient 50 - 74% Chair/bed transfer assistive device: Programmer, multimedia   Ambulation assist      Assist level: Moderate Assistance - Patient 50 - 74% Assistive device: Walker-rolling Max distance: 15   Walk 10 feet activity   Assist     Assist level: Moderate Assistance - Patient - 50 - 74% Assistive device: Walker-rolling   Walk 50 feet activity   Assist    Assist level:  Moderate Assistance - Patient - 50 - 74% Assistive device: Walker-rolling    Walk 150 feet activity   Assist Walk 150 feet activity did not occur: Safety/medical concerns         Walk 10 feet on uneven surface  activity   Assist Walk 10 feet on uneven surfaces activity did not occur: Safety/medical concerns         Wheelchair     Assist Will patient use wheelchair at discharge?: No (TBD)             Wheelchair 50 feet with 2 turns activity    Assist            Wheelchair 150 feet activity     Assist          Blood pressure 115/61, pulse (!) 59, temperature 98.9 F (37.2 C), temperature source Axillary, resp. rate 14, height '5\' 3"'$  (1.6 m), weight 61.5 kg, SpO2 95 %.  Medical Problem List and Plan: 1.   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   -Continue CIR therapies including PT, OT, and SLP ---home at end of week. Has plateaued from a functional recovery standpoint 2.  Antithrombotics: -DVT/anticoagulation:  Mechanical: Sequential compression devices, below knee Bilateral lower extremities             -antiplatelet therapy: N/A 3. Pain Management: Tylenol prn  -gabapentin for chronic headache  Appears to be controlled on 8/16 4. Mood: LCSW to follow for evaluation and support.              -antipsychotic agents: N/a 5. Neuropsych: This patient is not capable of making decisions on her own behalf. 6. Skin/Wound Care: Routine pressure relief measures.  7. Fluids/Electrolytes/Nutrition: Monitor I/Os.             -PO intake reasonable -continue to encourage  BMP 8/15 reviewed and is WNL 8. Kleb pneumo UTI: S to keflex--continue x 4 days total ( in addition to 3d rocephin), completed 8/10 9. Alzheimer's dementia:Continue Namenda and aricept             --Zoloft for mood stabilization             -continue sleep chart 10 Hypothyroid: On supplement. 11. Acute blood loss anemia:   Hemoglobin 11.5 on 8/15 12. Orthostatic Hypotension: Encourage fluid intake. Continue ProAmatine 5 mg tid.   Vitals:   04/22/21 1933 04/23/21 0408  BP: 117/62 115/61  Pulse: 63 (!) 59  Resp: 14 14  Temp: 98.9 F (37.2 C)   SpO2: 99% 95%   8/16 bp stable. Continue midodrine  LOS: 11 days A FACE TO FACE EVALUATION WAS PERFORMED  Meredith Staggers 04/23/2021, 1:47 PM

## 2021-04-23 NOTE — Progress Notes (Signed)
Speech Language Pathology TBI Note  Patient Details  Name: Michelle [name] MRN: [id number] Date of Birth: 14-Aug-1946  Today's Date: 04/23/2021 SLP Individual Time: 1255-1325 SLP Individual Time Calculation (min): 30 min  Short Term Goals: Week 2: SLP Short Term Goal 1 (Week 2): Patient will initiate functional tasks with Mod verbal and tactile cues in 75% of opportunities. SLP Short Term Goal 2 (Week 2): Patient will demonstrate sustained attention to functional and familiar tasks for 2 minutes with Max A multimodal cues. SLP Short Term Goal 3 (Week 2): Patient will follow 1-step commands with Min A multimodal cues. SLP Short Term Goal 4 (Week 2): Patient will answer basic yes/no questions in regards to wants/needs in 90% of opportunities with Min multimodal cues.  Skilled Therapeutic Interventions: Skilled treatment session focused on cognitive goals. Upon arrival, patient was awake in the recliner with RN feeding the patient. SLP facilitated session by providing extra time and Max verbal and tactile cues for patient to initiate drinking from cup. Patient able to bring fork to her mouth once food was scooped in 100% of opportunities with extra time and Max A multimodal cues needed. Patient appeared lethargic with minimal engagement noted. Patient answered to yes/no questions in 25% of opportunities and when asked any open ended question would shrug her shoulders. Patient left upright in recliner with alarm on and all needs within reach. Continue with current plan of care.     Pain No indications of pain   Agitated Behavior Scale: TBI Observation Details Observation Environment: Patient's room Start of observation period - Date: 04/23/21 Start of observation period - Time: 1255 End of observation period - Date: 04/23/21 End of observation period - Time: 1325 Agitated Behavior Scale (DO NOT LEAVE BLANKS) Short attention span, easy distractibility, inability to concentrate: Present to a  moderate degree Impulsive, impatient, low tolerance for pain or frustration: Absent Uncooperative, resistant to care, demanding: Absent Violent and/or threatening violence toward people or property: Absent Explosive and/or unpredictable anger: Absent Rocking, rubbing, moaning, or other self-stimulating behavior: Absent Pulling at tubes, restraints, etc.: Absent Wandering from treatment areas: Absent Restlessness, pacing, excessive movement: Absent Repetitive behaviors, motor, and/or verbal: Absent Rapid, loud, or excessive talking: Absent Sudden changes of mood: Absent Easily initiated or excessive crying and/or laughter: Absent Self-abusiveness, physical and/or verbal: Absent Agitated behavior scale total score: 16  Therapy/Group: Individual Therapy  Gracious Renken 04/23/2021, 1:30 PM

## 2021-04-24 NOTE — Progress Notes (Signed)
Physical Therapy TBI Note  Patient Details  Name: Michelle [name] MRN: [id number] Date of Birth: [birth year]  Today's Date: 04/24/2021 PT Individual Time: 0920-1020 PT Individual Time Calculation (min): 60 min   Short Term Goals: Week 2:  PT Short Term Goal 1 (Week 2): Pt will perform supine<>sit with Min assist PT Short Term Goal 2 (Week 2): Pt will perform sit<>stands with min assist PT Short Term Goal 3 (Week 2): Pt will perform bed<>chair transfers with min assist PT Short Term Goal 4 (Week 2): Pt will ambulate at least 74f using LRAD with min assist PT Short Term Goal 5 (Week 2): Pt will navigate up/down 4 steps using B HRs with min assist  Skilled Therapeutic Interventions/Progress Updates:     Patient in w/c in the room upon PT arrival. Patient alert and participatory throughout session without any outward signe of pain and denied pain verbally when asked. Patient continues to present with significant deficits in attention and initiation requiring increased time and max multimodal cuing to initiate tasks, however improved with repetition. Performed tasks with min and intermittent mod A when provided time a repetitive cues to initiate.   Therapeutic Activity: Transfers: Patient performed sit to/from stand from w/c x2 and mat table x2 with min A with B HHA on therapist, patient tended to reach for therapist's hips encouraging forward flexion in standing, improved with hand-over-hand assist to place patient's hands on therapist's shoulders. Provided max verbal, visual, and tactile cues for initiation, forward weight shift, and trunk extension in standing.  Patient was transported in the w/c with total A for energy conservation and time management to/from ortho gym for low-mod stimulating environment during session.  Patient ambulated 15-25 feet x4 using B upper extremity support on therapist's shoulders, patient demonstrated behaviors consistent with fear of falling with single HHA, with  min A and mod A when patient externally distracted and reaching to hold a table or attempting to sit on a rolling stool. Required increased time to redirect from these distractions. Ambulated with shuffling gait, significant forward flexion, and poor safety awareness. Provided max multimodal cues for erect posture, increased step height and length, and attention to task.  Patient provided seated rest breaks between trials. Patient sat EOB x2 with CGA for sitting balance with significant forward trunk flexion, provided max multimodal cues, responded best to visual cues, for erect posture in sitting. Patient with coughing x2 and provided tissues to blow her nose x3, cued patient to locate the tissue box initially, however, patient would not automatically retrieve tissues, placed tissues in front of patient and she slowly folded them then used them appropriately each time.  Patient in w/c in the room at end of session with breaks locked, seat belt alarm set, and all needs within reach.   Therapy Documentation Precautions:  Precautions Precautions: Fall Precaution Comments: HOH Restrictions Weight Bearing Restrictions: No  Agitated Behavior Scale: TBI Observation Details Observation Environment: CIR Start of observation period - Date: 04/24/21 Start of observation period - Time: 0920 End of observation period - Date: 04/24/21 End of observation period - Time: 1020 Agitated Behavior Scale (DO NOT LEAVE BLANKS) Short attention span, easy distractibility, inability to concentrate: Present to a moderate degree Impulsive, impatient, low tolerance for pain or frustration: Absent Uncooperative, resistant to care, demanding: Absent Violent and/or threatening violence toward people or property: Absent Explosive and/or unpredictable anger: Absent Rocking, rubbing, moaning, or other self-stimulating behavior: Absent Pulling at tubes, restraints, etc.: Absent Wandering from treatment areas:  Absent  Restlessness, pacing, excessive movement: Absent Repetitive behaviors, motor, and/or verbal: Absent Rapid, loud, or excessive talking: Absent Sudden changes of mood: Absent Easily initiated or excessive crying and/or laughter: Absent Self-abusiveness, physical and/or verbal: Absent Agitated behavior scale total score: 16   Therapy/Group: Individual Therapy  Kyan Giannone L Jackeline Gutknecht PT, DPT  04/24/2021, 5:08 PM

## 2021-04-24 NOTE — Progress Notes (Signed)
Patient ID: HENRY DEMERITT, female   DOB: 09/13/1945, 75 y.o.   MRN: [id number]  SW received phone call from pt dtr Abigail Butts who called about concerns with regard to her father who is in possible Afib and will possible have an echocardiogram on Friday. Wanted to discuss options for pt. SW shared [facility] with therapies and aide request for once a week for self-care and hiring private aide for more services, OR, short term rehab in SNF. SW discussed process in which a SNF must be located and insurance will need to approve for placement. States as a family they will discuss today what is the best plan considering his current health state, and her mother's care needs. SW also suggested [facility] as an option. States her father will be here today to discuss further.   *SW met with pt husband in room, and pt friend on phone. Discussed above. Provided Medicare.gov list, Adult nurse, and inform on [facility] [place]. SW reiterated that placement is based on a SNF excepting, and insurance approval, and it is possible that insurance may deny placement. Open to SW exploring options. Will f/u with SW about SNF preference.   Loralee Pacas, MSW, [place] Office: [phone] Cell: [phone] Fax: [phone]

## 2021-04-24 NOTE — Progress Notes (Signed)
Occupational Therapy TBI Note  Patient Details  Name: Michelle [name] MRN: [id number] Date of Birth: 05-12-1946  Today's Date: 04/24/2021 OT Individual Time: [id number] OT Individual Time Calculation (min): 31 min    Short Term Goals: Week 2:  OT Short Term Goal 1 (Week 2): Pt will complete 1 familiar ADL task with no more than mod cueing for initiation OT Short Term Goal 2 (Week 2): Pt will complete UB dressing with mod A consistently for 3 sessions OT Short Term Goal 3 (Week 2): Pt will initiate 50% of commands during sessions  Skilled Therapeutic Interventions/Progress Updates:    Pt sitting in wheelchair with eyes closed and hands on her face.  She opened them to therapist's verbal and tactile stimulation.  Therapist placed his hand out and she in turn shook it.  Non-verbal throughout session overall with her spouse present, but initially on the phone.  Had her complete sit to stand with max demonstratiional cueing for initiation and mod assist to complete.  Once standing she reached and place her hands inside of her pants at her buttocks.  Therapist noticed slightly soiled brief when she did this and proceeded to assist pt with functional mobility to the bathroom.  Increased trunk flexion noted with mobility and no assistive device, with decreased reciprical pattern and pt reaching out with her arms to try and grab hold of anything available she could reach.  Once to the toilet, she needed total assist for managing clothing with max assist to initiate and complete stand tp sit transition.  After several mins, she was unsuccessful with further voiding or BM, so both therapist and her spouse assisted with peri cleaning at total assist in standing as well as for donning new brief and pulling up pants.  When pt was presented with wipes and given the verbal as well as demonstrational cueing to clean herself, she would not initiate the task.  Ambulated back out to the wheelchair at max assist level  without assistive device again, where she was left with her spouse with the call button and phone in reach and safety belt in place.  Had brief discussion with spouse on pt's current level and pt's upcoming discharge plans.  He feels like he will not be able to take her home on Friday secondary to him having a cardioversion scheduled that am.  Will pass on to the team this information.    Therapy Documentation Precautions:  Precautions Precautions: Fall Precaution Comments: HOH Restrictions Weight Bearing Restrictions: No  Pain: Pain Assessment Pain Scale: Faces Pain Score: 0-No pain Agitated Behavior Scale: TBI Observation Details Observation Environment: pt room Start of observation period - Date: 04/24/21 Start of observation period - Time: 1411 End of observation period - Date: 04/24/21 End of observation period - Time: 1442 Agitated Behavior Scale (DO NOT LEAVE BLANKS) Short attention span, easy distractibility, inability to concentrate: Present to a moderate degree Impulsive, impatient, low tolerance for pain or frustration: Absent Uncooperative, resistant to care, demanding: Present to a slight degree Violent and/or threatening violence toward people or property: Absent Explosive and/or unpredictable anger: Absent Rocking, rubbing, moaning, or other self-stimulating behavior: Absent Pulling at tubes, restraints, etc.: Absent Wandering from treatment areas: Absent Restlessness, pacing, excessive movement: Absent Repetitive behaviors, motor, and/or verbal: Absent Rapid, loud, or excessive talking: Absent Sudden changes of mood: Absent Easily initiated or excessive crying and/or laughter: Absent Self-abusiveness, physical and/or verbal: Absent Agitated behavior scale total score: 17  ADL: See Care Tool Section for some  details of mobility and selfcare   Therapy/Group: Individual Therapy  Ladoris Lythgoe OTR/L 04/24/2021, 4:19 PM

## 2021-04-24 NOTE — Progress Notes (Signed)
Speech Language Pathology TBI Note  Patient Details  Name: Michelle [name] MRN: [id number] Date of Birth: October 09, 1945  Today's Date: 04/24/2021 SLP Individual Time: 1300-1400 SLP Individual Time Calculation (min): 60 min  Short Term Goals: Week 2: SLP Short Term Goal 1 (Week 2): Patient will initiate functional tasks with Mod verbal and tactile cues in 75% of opportunities. SLP Short Term Goal 2 (Week 2): Patient will demonstrate sustained attention to functional and familiar tasks for 2 minutes with Max A multimodal cues. SLP Short Term Goal 3 (Week 2): Patient will follow 1-step commands with Min A multimodal cues. SLP Short Term Goal 4 (Week 2): Patient will answer basic yes/no questions in regards to wants/needs in 90% of opportunities with Min multimodal cues.  Skilled Therapeutic Interventions: Pt seen for skilled ST with focus on cognitive goals. NT present assisting pt with lunch meal, SLP taking over supervision/assist. Pt able to self-feed finger foods this date with setup A and min verbal cues. Pt requiring max verbal and tactile cues to initiate drinking from straw and self-feeding with prepared fork. Pt externally distracted by towel and arm bands, max cues effective in redirecting to meal. Pt consuming ~25% of meal before becoming lethargic and putting head in hands. SLP facilitated simple yes/no questions related to wants, needs and biographical information with pt responding in 40% of opportunities. Pt left in wheelchair with alarm belt on and all needs within reach. Cont ST POC.   Pain Pain Assessment Pain Scale: 0-10 Pain Score: 0-No pain PAINAD (Pain Assessment in Advanced Dementia) Breathing: normal Negative Vocalization: none Facial Expression: smiling or inexpressive Body Language: relaxed Consolability: no need to console PAINAD Score: 0  Agitated Behavior Scale: TBI Observation Details Observation Environment: pt room Start of observation period - Date:  04/24/21 Start of observation period - Time: 1300 End of observation period - Date: 04/24/21 End of observation period - Time: 1400 Agitated Behavior Scale (DO NOT LEAVE BLANKS) Short attention span, easy distractibility, inability to concentrate: Present to a moderate degree Impulsive, impatient, low tolerance for pain or frustration: Absent Uncooperative, resistant to care, demanding: Absent Violent and/or threatening violence toward people or property: Absent Explosive and/or unpredictable anger: Absent Rocking, rubbing, moaning, or other self-stimulating behavior: Absent Pulling at tubes, restraints, etc.: Absent Wandering from treatment areas: Absent Restlessness, pacing, excessive movement: Absent Repetitive behaviors, motor, and/or verbal: Absent Rapid, loud, or excessive talking: Absent Sudden changes of mood: Absent Easily initiated or excessive crying and/or laughter: Absent Self-abusiveness, physical and/or verbal: Absent Agitated behavior scale total score: 16  Therapy/Group: Individual Therapy  Dewaine Conger 04/24/2021, 1:35 PM

## 2021-04-24 NOTE — Progress Notes (Signed)
Occupational Therapy TBI Note  Patient Details  Name: AMINAH ZABAWA MRN: [id number] Date of Birth: [birth year]  Today's Date: 04/24/2021 OT Individual Time: 1694-5038 OT Individual Time Calculation (min): 60 min    Short Term Goals: Week 2:  OT Short Term Goal 1 (Week 2): Pt will complete 1 familiar ADL task with no more than mod cueing for initiation OT Short Term Goal 2 (Week 2): Pt will complete UB dressing with mod A consistently for 3 sessions OT Short Term Goal 3 (Week 2): Pt will initiate 50% of commands during sessions  Skilled Therapeutic Interventions/Progress Updates:     Pt received in bed asleep, eventually when EOB putting hands on face leaning into OT. No indication of pain  ADL:  Pt requires total A to change brief and pants at bed level rolling d/t poor cognition and arousal. Pt sup>sit wit MAX A and requires min-total A for sitting balance with hands on face. MAX A to power up into standin and MOD A to pivot steps over to w/c with RW. Pt demo poor initiation this date. Despite 15 min attempted to self feed and total A feeding (pt shaking head "yes" to being hungry). Pt doesn't open mouth and draws face away from food brought to mouth. Pt able ot initate washing face with tactile cues and comb hair when handed brush, however pt shows teeth mimicing visual cue from OT but resists hand under hand A to bring toothbrush to mouth. MAX A overall to change shirt.  Pt left at end of session in bed with exit alarm on, call light in reach and all needs met   Therapy Documentation Precautions:  Precautions Precautions: Fall Precaution Comments: HOH Restrictions Weight Bearing Restrictions: No General:   Vital Signs:  Pain: Pain Assessment Pain Scale: 0-10 Pain Score: 0-No pain PAINAD (Pain Assessment in Advanced Dementia) Breathing: normal Negative Vocalization: none Facial Expression: smiling or inexpressive Body Language: relaxed Consolability: no need to  console PAINAD Score: 0 Agitated Behavior Scale: TBI  Observation Details Observation Environment: pt room Start of observation period - Date: 04/24/21 Start of observation period - Time: 0800 End of observation period - Date: 04/24/21 End of observation period - Time: 0900 Agitated Behavior Scale (DO NOT LEAVE BLANKS) Short attention span, easy distractibility, inability to concentrate: Present to an extreme degree Impulsive, impatient, low tolerance for pain or frustration: Absent Uncooperative, resistant to care, demanding: Absent Violent and/or threatening violence toward people or property: Absent Explosive and/or unpredictable anger: Absent Rocking, rubbing, moaning, or other self-stimulating behavior: Absent Pulling at tubes, restraints, etc.: Absent Wandering from treatment areas: Absent Restlessness, pacing, excessive movement: Absent Repetitive behaviors, motor, and/or verbal: Absent Rapid, loud, or excessive talking: Absent Sudden changes of mood: Absent Easily initiated or excessive crying and/or laughter: Absent Self-abusiveness, physical and/or verbal: Absent Agitated behavior scale total score: 17  ADL: ADL Grooming: Moderate assistance Where Assessed-Grooming: Wheelchair, Sitting at sink Upper Body Bathing: Maximal assistance Where Assessed-Upper Body Bathing: Sitting at sink, Wheelchair Lower Body Bathing: Dependent Where Assessed-Lower Body Bathing: Sitting at sink, Standing at sink, Wheelchair Upper Body Dressing: Maximal assistance Where Assessed-Upper Body Dressing: Wheelchair Lower Body Dressing: Maximal assistance Where Assessed-Lower Body Dressing: Wheelchair Toileting: Dependent Where Assessed-Toileting: Other (Comment) (incontinent of bladder in brief) Vision   Perception    Praxis   Exercises:   Other Treatments:     Therapy/Group: Individual Therapy  Tonny Branch 04/24/2021, 11:20 AM

## 2021-04-24 NOTE — NC FL2 (Signed)
[place] LEVEL OF CARE SCREENING TOOL     IDENTIFICATION  Patient Name: Michelle [name] Birthdate: 1946-06-18 Sex: female Admission Date (Current Location): 04/12/2021  [facility] and [place] Number:  [employer] and Address:  [facility] [place] [address] [place], [place] [address]      Provider Number: [id number]  Attending Physician Name and Address:  Meredith Staggers, MD  Relative Name and Phone Number:  Fifi Beasley (husband) [phone]    Current Level of Care: Hospital Recommended Level of Care: [place] Prior Approval Number:    Date Approved/Denied:   PASRR Number:    Discharge Plan: SNF    Current Diagnoses: Patient Active Problem List   Diagnosis Date Noted   Acute blood loss anemia    UTI due to Klebsiella species    Chronic nonintractable headache    Subdural hemorrhage following injury ([place]) 04/12/2021   Intracranial hemorrhage ([place]) 04/11/2021   CKD (chronic kidney disease), stage II    Intracranial hemorrhage following injury ([place]) 04/08/2021   Orthostatic hypotension 02/08/2020   Near syncope 02/07/2020   Acute lower UTI 02/07/2020   Late onset Alzheimer's dementia with behavioral disturbance ([place]) 11/10/2019   Idiopathic hypotension 10/12/2019   Syncope 10/12/2019   Pain in joint of right shoulder 09/20/2019   Impingement syndrome of right shoulder region 09/20/2019   Viral upper respiratory tract infection 08/24/2019   Pain of joint of left ankle and foot 03/18/2019   Peroneal mononeuropathy, left 12/15/2016   Preventative health care 10/31/2016   Left foot drop 10/14/2016   Weight loss 10/14/2016   Tension-type headache, not intractable 09/17/2016   Meningioma ([place]) 06/05/2016   Mild cognitive impairment 04/02/2016   Depression 04/02/2016   Memory loss 03/21/2016   Asymptomatic postmenopausal status 07/26/2009   INTESTINAL GAS 09/18/2008   ONYCHOMYCOSIS, TOENAILS  08/14/2008   Unspecified glaucoma 07/24/2008   Hypertensive disorder 07/24/2008   MYALGIA 02/29/2008   COUGH 07/26/2007   ADENOMATOUS COLONIC POLYP 05/20/2007   DIVERTICULOSIS, COLON 05/20/2007   Hypothyroidism 04/07/2007   Hypercholesterolemia 04/07/2007   GERD 04/07/2007   IBS 04/07/2007    Orientation RESPIRATION BLADDER Height & Weight      (varies)  Normal Incontinent Weight: 135 lb 9.3 oz (61.5 kg) Height:  '5\' 3"'$  (160 cm)  BEHAVIORAL SYMPTOMS/MOOD NEUROLOGICAL BOWEL NUTRITION STATUS      Incontinent Diet  AMBULATORY STATUS COMMUNICATION OF NEEDS Skin   Limited Assist Non-Verbally (must anticipate pt care needs) Normal                       Personal Care Assistance Level of Assistance  Bathing, Feeding, Dressing Bathing Assistance: Limited assistance Feeding assistance: Limited assistance (requires supervision with meals) Dressing Assistance: Limited assistance     Functional Limitations Info  Hearing   Hearing Info: Impaired (has bilateral hearing aides)      [place]  PT (By licensed PT), OT (By licensed OT), Speech therapy     PT Frequency: 5xs per week OT Frequency: 5xs per week     Speech Therapy Frequency: 5xs per week      Contractures Contractures Info: Not present    Additional Factors Info  Code Status, Allergies Code Status Info: DNR Allergies Info: NKA           Current Medications (04/24/2021):  This is the current hospital active medication list Current Facility-Administered Medications  Medication Dose Route Frequency Provider  Last Rate Last Admin   acetaminophen (TYLENOL) tablet 325-650 mg  325-650 mg Oral Q4H PRN Love, Pamela S, PA-C       alum & mag hydroxide-simeth (MAALOX/MYLANTA) 200-200-20 MG/5ML suspension 30 mL  30 mL Oral Q4H PRN Love, Pamela S, PA-C       bisacodyl (DULCOLAX) suppository 10 mg  10 mg Rectal Daily PRN Love, Pamela S, PA-C       diphenhydrAMINE (BENADRYL) 12.5 MG/5ML elixir 12.5-25  mg  12.5-25 mg Oral Q6H PRN Love, Pamela S, PA-C       donepezil (ARICEPT) tablet 10 mg  10 mg Oral QHS Love, Pamela S, PA-C   10 mg at 04/23/21 2014   fluticasone (FLONASE) 50 MCG/ACT nasal spray 2 spray  2 spray Each Nare Daily Bary Leriche, PA-C   2 spray at 04/24/21 0843   gabapentin (NEURONTIN) capsule 300 mg  300 mg Oral BID Bary Leriche, PA-C   300 mg at 04/24/21 0843   guaiFENesin-dextromethorphan (ROBITUSSIN DM) 100-10 MG/5ML syrup 5-10 mL  5-10 mL Oral Q6H PRN Bary Leriche, PA-C   10 mL at 04/13/21 1944   levothyroxine (SYNTHROID) tablet 50 mcg  50 mcg Oral Q0600 Bary Leriche, PA-C   50 mcg at 04/24/21 0546   loratadine (CLARITIN) tablet 10 mg  10 mg Oral Daily Bary Leriche, PA-C   10 mg at 04/24/21 0843   memantine (NAMENDA) tablet 10 mg  10 mg Oral BID Bary Leriche, PA-C   10 mg at 04/24/21 0843   midodrine (PROAMATINE) tablet 5 mg  5 mg Oral TID WC LoveIvan Anchors, PA-C   5 mg at 04/24/21 1145   polyethylene glycol (MIRALAX / GLYCOLAX) packet 17 g  17 g Oral Daily PRN Bary Leriche, PA-C   17 g at 04/21/21 1230   prochlorperazine (COMPAZINE) tablet 5-10 mg  5-10 mg Oral Q6H PRN Love, Pamela S, PA-C       Or   prochlorperazine (COMPAZINE) injection 5-10 mg  5-10 mg Intramuscular Q6H PRN Love, Pamela S, PA-C       Or   prochlorperazine (COMPAZINE) suppository 12.5 mg  12.5 mg Rectal Q6H PRN Love, Pamela S, PA-C       sertraline (ZOLOFT) tablet 100 mg  100 mg Oral Daily Reesa Chew S, PA-C   100 mg at 04/24/21 [id number]   sodium phosphate (FLEET) 7-19 GM/118ML enema 1 enema  1 enema Rectal Once PRN Love, Pamela S, PA-C       traZODone (DESYREL) tablet 25-50 mg  25-50 mg Oral QHS PRN Bary Leriche, PA-C   25 mg at 04/12/21 2032     Discharge Medications: Please see discharge summary for a list of discharge medications.  Relevant Imaging Results:  Relevant Lab Results:   Additional Information [id number]  Rana Snare, LCSW

## 2021-04-25 NOTE — Progress Notes (Signed)
Patient ID: [name], female   DOB: 26-Jun-1946, 75 y.o.   MRN: [id number]  SW received phone call from [place] with [facility] stating she received phone call from pt husband reporting that he was coming in this afternoon for a tour. Needs to confirm if pt is fully vaccinated. Also states pt will have semi-private room until private room becomes available.  SW met with pt husband in room to discuss SNF preference: 1) [facility] (bed offer), 2) [facility] (pending), 3) [facility] (declined no beds), 4) [facility] (bed offer), and 4) [facility] (bed offer). SW shared with pt husband all SNFs that extended bed offer,and those that declined. SW reiterated once he confirms a SNF, will submit SNF authorization to insurance. Confirms he is going tour Programme researcher, broadcasting/film/video today at [employer] and will f/u with Day, MSW, [place] Office: [phone] Cell: [phone] Fax: [phone]

## 2021-04-25 NOTE — Progress Notes (Signed)
Occupational Therapy TBI Note  Patient Details  Name: Michelle [name] MRN: [id number] Date of Birth: 02-11-1946  Today's Date: 04/25/2021 OT Individual Time: 1000-1058 OT Individual Time Calculation (min): 58 min    Short Term Goals: Week 1:  OT Short Term Goal 1 (Week 1): Patient will attend to grooming tasks and complete with min cues and CGA in stance OT Short Term Goal 1 - Progress (Week 1): Not met OT Short Term Goal 2 (Week 1): patient will complete upper body bathing and dressing with min A OT Short Term Goal 2 - Progress (Week 1): Not met OT Short Term Goal 3 (Week 1): patient will complete toileting, lower body bathing and dressing with mod A OT Short Term Goal 3 - Progress (Week 1): Not met OT Short Term Goal 4 (Week 1): patient will complete sit to stand and SPT with LRAD min a OT Short Term Goal 4 - Progress (Week 1): Not met  Skilled Therapeutic Interventions/Progress Updates:     Pt received in w/c with no no indication of pain  ADL:  Pt completes UB dressing with seated in w/c with MAX A to doff and MOD A to don with pt demoing poor continuation skill holding face mid way through donning shirt Pt completes toileting with MAX A for CM and hand under hand to initiate wiping.  Pt completes toileting transfer with MIN A after SIGNIFICNTLY increased time to intiate sit to stand with min A for facilitation of weight sihft and walks to/from bathroom with MAX A for steering/faciliating pivot steps. If pt walking in straight line, pt does well and requires MIN A Pt completes  grooming seated at sink with hand under hand at to initiate/seuqnce face washing and toothbrushing. Pt able ot follow VC for locating comb/combing hair.  Pt left at end of session in bed with exit alarm on, call light in reach and all needs met  Session 2: 45 min 1300-1345 time  Pt received in w/c agreeable to OT with head nod and no indication of pain. Pt escorted to ADL apartment total A for time. Pt  able to apply 2 pillow cases to pillows when set up next to EOB with pillow in lap and pillow case handed to patient. Pt continues to demo poor sustainedd attention and initiation/continuation skill.  Attempted pt sitting on red foam wedge on EOM to improve anterior pelvic tilt, however pt demo no righting reactions nearly laying flat.towel rolls placed in w/c at lumbr spint and vertical in upside down T shapr to improve posture in w/c. Pt completes towel folding grading up as pt able initially with OT and pt folding same towel, increasing to pt mirroring OT folding towel to pt being able to fold towel with VC for selective attention d/t mod stim environment. Exited session with pt seated in w/c, exit alarm on and call light in reach    Therapy Documentation Precautions:  Precautions Precautions: Fall Precaution Comments: HOH Restrictions Weight Bearing Restrictions: No General:   Vital Signs:  Pain:   Agitated Behavior Scale: TBI Observation Details Observation Environment: pt room Start of observation period - Date: 04/25/21 Start of observation period - Time: 1300 End of observation period - Date: 04/25/21 End of observation period - Time: 1345 Agitated Behavior Scale (DO NOT LEAVE BLANKS) Short attention span, easy distractibility, inability to concentrate: Present to a moderate degree Impulsive, impatient, low tolerance for pain or frustration: Absent Uncooperative, resistant to care, demanding: Absent Violent and/or threatening violence toward  people or property: Absent Explosive and/or unpredictable anger: Absent Rocking, rubbing, moaning, or other self-stimulating behavior: Absent Pulling at tubes, restraints, etc.: Absent Wandering from treatment areas: Absent Restlessness, pacing, excessive movement: Absent Repetitive behaviors, motor, and/or verbal: Present to a slight degree Rapid, loud, or excessive talking: Absent Sudden changes of mood: Absent Easily initiated or  excessive crying and/or laughter: Absent Self-abusiveness, physical and/or verbal: Absent Agitated behavior scale total score: 17  ADL: ADL Grooming: Moderate assistance Where Assessed-Grooming: Wheelchair, Sitting at sink Upper Body Bathing: Maximal assistance Where Assessed-Upper Body Bathing: Sitting at sink, Wheelchair Lower Body Bathing: Dependent Where Assessed-Lower Body Bathing: Sitting at sink, Standing at sink, Wheelchair Upper Body Dressing: Maximal assistance Where Assessed-Upper Body Dressing: Wheelchair Lower Body Dressing: Maximal assistance Where Assessed-Lower Body Dressing: Wheelchair Toileting: Dependent Where Assessed-Toileting: Other (Comment) (incontinent of bladder in brief) Vision   Perception    Praxis   Exercises:   Other Treatments:     Therapy/Group: Individual Therapy  Tonny Branch 04/25/2021, 10:07 AM

## 2021-04-25 NOTE — Progress Notes (Signed)
PROGRESS NOTE   Subjective/Complaints: Pt up in bed. No new problems reported. Husband came in later. I returned to speak with him  ROS: Limited due to cognitive/behavioral   Objective:   No results found. No results for input(s): WBC, HGB, HCT, PLT in the last 72 hours.  No results for input(s): NA, K, CL, CO2, GLUCOSE, BUN, CREATININE, CALCIUM in the last 72 hours.   Intake/Output Summary (Last 24 hours) at 04/25/2021 1529 Last data filed at 04/24/2021 1830 Gross per 24 hour  Intake 120 ml  Output --  Net 120 ml         Physical Exam: Vital Signs Blood pressure (!) 105/57, pulse 63, temperature 98.1 F (36.7 C), temperature source Oral, resp. rate 15, height '5\' 3"'$  (1.6 m), weight 61.5 kg, SpO2 100 %. Constitutional: No distress . Vital signs reviewed. HEENT: NCAT, EOMI, oral membranes moist Neck: supple Cardiovascular: RRR without murmur. No JVD    Respiratory/Chest: CTA Bilaterally without wheezes or rales. Normal effort    GI/Abdomen: BS +, non-tender, non-distended Ext: no clubbing, cyanosis, or edema Psych: flat, cooperative Skin: intact Musc: No edema in extremities.  No tenderness in extremities. Neuro: Alert Motor: Spontaneously moving bilateral upper extremities. Makes good eye contact. When she hears does respond to verbal cues and answers basic questions. Delayed in processing, initiating HOH but able to communicate when both hearing aids are in Limited insight and awareness.   Assessment/Plan: 1. Functional deficits which require 3+ hours per day of interdisciplinary therapy in a comprehensive inpatient rehab setting. Physiatrist is providing close team supervision and 24 hour management of active medical problems listed below. Physiatrist and rehab team continue to assess barriers to discharge/monitor patient progress toward functional and medical goals  Care Tool:  Bathing    Body parts bathed  by patient: Face, Front perineal area, Right upper leg, Left upper leg   Body parts bathed by helper: Right arm, Left arm, Chest, Abdomen, Buttocks, Right lower leg, Left lower leg     Bathing assist Assist Level: Maximal Assistance - Patient 24 - 49%     Upper Body Dressing/Undressing Upper body dressing   What is the patient wearing?: Pull over shirt    Upper body assist Assist Level: Maximal Assistance - Patient 25 - 49%    Lower Body Dressing/Undressing Lower body dressing      What is the patient wearing?: Underwear/pull up, Pants     Lower body assist Assist for lower body dressing: Maximal Assistance - Patient 25 - 49%     Toileting Toileting    Toileting assist Assist for toileting: Total Assistance - Patient < 25%     Transfers Chair/bed transfer  Transfers assist     Chair/bed transfer assist level: Moderate Assistance - Patient 50 - 74% Chair/bed transfer assistive device: Programmer, multimedia   Ambulation assist      Assist level: Maximal Assistance - Patient 25 - 49% Assistive device: No Device Max distance: 10'   Walk 10 feet activity   Assist     Assist level: Moderate Assistance - Patient - 50 - 74% Assistive device: Walker-rolling   Walk 50 feet activity  Assist    Assist level: Moderate Assistance - Patient - 50 - 74% Assistive device: Walker-rolling    Walk 150 feet activity   Assist Walk 150 feet activity did not occur: Safety/medical concerns         Walk 10 feet on uneven surface  activity   Assist Walk 10 feet on uneven surfaces activity did not occur: Safety/medical concerns         Wheelchair     Assist Will patient use wheelchair at discharge?: No             Wheelchair 50 feet with 2 turns activity    Assist            Wheelchair 150 feet activity     Assist          Blood pressure (!) 105/57, pulse 63, temperature 98.1 F (36.7 C), temperature source Oral,  resp. rate 15, height '5\' 3"'$  (1.6 m), weight 61.5 kg, SpO2 100 %.  Medical Problem List and Plan: 1.  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   -Continue CIR therapies including PT, OT, and SLP ---husband is having cardioversion tomorrow. Now pursuing SNF  -discussed prognosis, TBI issues with husband today 2.  Antithrombotics: -DVT/anticoagulation:  Mechanical: Sequential compression devices, below knee Bilateral lower extremities             -antiplatelet therapy: N/A 3. Pain Management: Tylenol prn  -gabapentin for chronic headache  Appears to be controlled on 8/18 4. Mood: LCSW to follow for evaluation and support.              -antipsychotic agents: N/a 5. Neuropsych: This patient is not capable of making decisions on her own behalf. 6. Skin/Wound Care: Routine pressure relief measures.  7. Fluids/Electrolytes/Nutrition: Monitor I/Os.             -PO intake reasonable -continue to encourage  BMP 8/15 reviewed and is WNL 8. Kleb pneumo UTI: S to keflex--continue x 4 days total ( in addition to 3d rocephin), completed 8/10 9. Alzheimer's dementia:Continue Namenda and aricept             --Zoloft for mood stabilization             -continue sleep chart 10 Hypothyroid: On supplement. 11. Acute blood loss anemia:   Hemoglobin 11.5 on 8/15 12. Orthostatic Hypotension: Encourage fluid intake. Continue ProAmatine 5 mg tid.   Vitals:   04/25/21 0525 04/25/21 1300  BP: 114/60 (!) 105/57  Pulse: (!) 55 63  Resp: 16 15  Temp: 97.6 F (36.4 C) 98.1 F (36.7 C)  SpO2: 100% 100%   8/18 bp stable. Continue midodrine  LOS: 13 days A FACE TO FACE EVALUATION WAS PERFORMED  Meredith Staggers 04/25/2021, 3:29 PM

## 2021-04-25 NOTE — Progress Notes (Signed)
Physical Therapy Session Note  Patient Details  Name: GINNI IXTA MRN: [id number] Date of Birth: May 27, 1946  Today's Date: 04/25/2021 PT Missed Time: 27 Minutes Missed Time Reason: Patient fatigue  Short Term Goals: Week 2:  PT Short Term Goal 1 (Week 2): Pt will perform supine<>sit with Min assist PT Short Term Goal 2 (Week 2): Pt will perform sit<>stands with min assist PT Short Term Goal 3 (Week 2): Pt will perform bed<>chair transfers with min assist PT Short Term Goal 4 (Week 2): Pt will ambulate at least 30f using LRAD with min assist PT Short Term Goal 5 (Week 2): Pt will navigate up/down 4 steps using B HRs with min assist  Skilled Therapeutic Interventions/Progress Updates:     Pt seated in recliner with eyes closed. Opens eyes briefly when PT addresses pt for therapy, but then closes eyes and does not respond to PT. Will follow up as appropriate.  Therapy Documentation Precautions:  Precautions Precautions: Fall Precaution Comments: HOH Restrictions Weight Bearing Restrictions: No General: PT Amount of Missed Time (min): 30 Minutes PT Missed Treatment Reason: Patient fatigue   Therapy/Group: Individual Therapy  WBreck Coons PT, DPT 04/25/2021, 4:02 PM

## 2021-04-25 NOTE — Progress Notes (Signed)
Physical Therapy TBI Note  Patient Details  Name: Michelle [name] MRN: [id number] Date of Birth: 01/17/1946  Today's Date: 04/25/2021 PT Individual Time: [id number] PT Individual Time Calculation (min): 25 min   Short Term Goals: Week 2:  PT Short Term Goal 1 (Week 2): Pt will perform supine<>sit with Min assist PT Short Term Goal 2 (Week 2): Pt will perform sit<>stands with min assist PT Short Term Goal 3 (Week 2): Pt will perform bed<>chair transfers with min assist PT Short Term Goal 4 (Week 2): Pt will ambulate at least 33f using LRAD with min assist PT Short Term Goal 5 (Week 2): Pt will navigate up/down 4 steps using B HRs with min assist  Skilled Therapeutic Interventions/Progress Updates:     Patient in bed upon PT arrival. Patient alert and participatory throughout session without any outward sign of pain and denied pain verbally when asked. Patient's husband arrived during session, patient very happy with intermittent verbal phrases and increased engagement with her husband present.   Therapeutic Activity: Bed Mobility: Patient performed supine to sit with min-mod A with increased time due to deficits in sustained attention to task, required x2 attempts. Provided mod multimodal cues for initiation, attention, and sequencing. In sitting patient with significant posterior bias requiring min-mod A for sitting balance and returning to lying without signs of awareness of change in spatial orientation. Donned paper scrub pants with patient sitting EOB and pulled pants over hips in standing with total-max A.  Transfers: Patient performed sit to/from stand x1 and progressed to stand pivot on second trial with min-mod A. Provided mod-max multimodal cues for initiation, forward weight shift, and looking ahead toward her husband for visual target.  Patient in w/c with her husband in the room at end of session with breaks locked, seat belt alarm set, and all needs within reach.   Therapy  Documentation Precautions:  Precautions Precautions: Fall Precaution Comments: HOH Restrictions Weight Bearing Restrictions: No  Agitated Behavior Scale: TBI Observation Details Observation Environment: pt room Start of observation period - Date: 04/25/21 Start of observation period - Time: 0845 End of observation period - Date: 04/25/21 End of observation period - Time: 0910 Agitated Behavior Scale (DO NOT LEAVE BLANKS) Short attention span, easy distractibility, inability to concentrate: Present to a moderate degree Impulsive, impatient, low tolerance for pain or frustration: Absent Uncooperative, resistant to care, demanding: Absent Violent and/or threatening violence toward people or property: Absent Explosive and/or unpredictable anger: Absent Rocking, rubbing, moaning, or other self-stimulating behavior: Absent Pulling at tubes, restraints, etc.: Absent Wandering from treatment areas: Absent Restlessness, pacing, excessive movement: Absent Repetitive behaviors, motor, and/or verbal: Present to a slight degree Rapid, loud, or excessive talking: Absent Sudden changes of mood: Absent Easily initiated or excessive crying and/or laughter: Absent Self-abusiveness, physical and/or verbal: Absent Agitated behavior scale total score: 17      Therapy/Group: Individual Therapy  Doll Frazee L Asani Deniston PT, DPT  04/25/2021, 4:42 PM

## 2021-04-25 NOTE — Discharge Summary (Addendum)
Physician Discharge Summary  Patient ID: Michelle [name] MRN: [id number] DOB/AGE: [birth year] 75 y.o.  Admit date: 04/12/2021 Discharge date: 05/02/2021  Discharge Diagnoses:  Principal Problem:   Subdural hemorrhage following injury [facility]) Active Problems:   Late onset Alzheimer's dementia with behavioral disturbance (HCC)   Acute blood loss anemia   Chronic nonintractable headache   Dyspepsia   Discharged Condition: stable  Significant Diagnostic Studies: N/a  Labs:  Basic Metabolic Panel: BMP Latest Ref Rng & Units 04/29/2021 04/22/2021 04/09/2021  Glucose 70 - 99 mg/dL 92 99 116(H)  BUN 8 - 23 mg/dL '16 18 16  '$ Creatinine 0.44 - 1.00 mg/dL 0.81 0.79 0.95  Sodium 135 - 145 mmol/L 139 136 143  Potassium 3.5 - 5.1 mmol/L 3.7 3.7 4.2  Chloride 98 - 111 mmol/L 105 104 110  CO2 22 - 32 mmol/L '30 26 27  '$ Calcium 8.9 - 10.3 mg/dL 9.1 9.1 8.8(L)     CBC: CBC Latest Ref Rng & Units 04/29/2021 04/22/2021 04/18/2021  WBC 4.0 - 10.5 K/uL 6.9 8.5 7.9  Hemoglobin 12.0 - 15.0 g/dL 11.5(L) 11.5(L) 11.2(L)  Hematocrit 36.0 - 46.0 % 35.0(L) 34.7(L) 32.6(L)  Platelets 150 - 400 K/uL 292 326 288     CBG: No results for input(s): GLUCAP in the last 168 hours.  Brief HPI:   Michelle [name] is a 75 y.o. female with history of HTN, hearing loss, idiopathic hypotension, chronic Alzheimer's dementia who was admitted to [facility] long hospital on 04/08/2021 after falling and striking her forehead which was followed by brief LOC.  She was confused and mumbling and had complaints of headache.  CT head done showing left frontal scalp swelling with left frontal SAH, left frontal IPH and SDH and left frontal convexity.  Dr. Christella Noa was consulted for input and felt that no surgical intervention needed.  Patient did have worsening of mental status on 08/03 and MRI brain done showing no change in acute subdural hemorrhage and multiple hemorrhagic parenchymal contusions stable.  Urine culture was done showing greater than  100,000 colonies of Kleb pneumonia and she was started on Rocephin for treatment.  Her mentation was improving with increasing ability to follow commands however she continued to have issues with delayed processing, balance deficits as well as posterior lean with ADLs and mobility.  CIR was recommended due to functional decline.   Hospital Course: [name] was admitted to rehab 04/12/2021 for inpatient therapies to consist of PT, ST and OT at least three hours five days a week. Past admission physiatrist, therapy team and rehab RN have worked together to provide customized collaborative inpatient rehab.Rocephin was transitioned to Keflex at admission to complete 7 total days of antibiotic regimen for Kleb UTI.  Her blood pressures were monitored on TID basis and she continues on ProAmatine due to chronic hypotension.  Lethargy has resolved and blood pressures have been reasonably stable.  She was encouraged to increase fluid intake and check of electrolytes shows renal status to be within normal limits.  She continues on Pradaxa for secondary stroke prophylaxis and serial check of CBC shows acute blood loss anemia to be stable.  Chronic headaches have been reasonably controlled on home dose gabapentin.Mood has been stable on current regimen amantadine was added to help with activation on 08/22 and is showing improvement in attention.     Rehab course: During patient's stay in rehab weekly team conferences were held to monitor patient's progress, set goals and discuss barriers to discharge. At admission, patient  required mod assist with mobility and with ADL tasks.  She exhibited cognitive deficits with decreased ability to follow one-step command and required max cues to answer simple yes/no questions. She  has had improvement in activity tolerance, balance, postural control as well as ability to compensate for deficits. She requires max assist with ADL tasks and mod to max assist with mobility. She     She is tolerating current diet without s/s of aspiration.   Disposition: [facility].   Diet: Dysphagia 3, thins liquids.   Special Instructions: 1.Needs full supervision and assistance with meals.  2. Medications to be administered crushed in puree.    Allergies as of 05/02/2021   No Known Allergies      Medication List     STOP taking these medications    AMBULATORY NON FORMULARY MEDICATION   CALCIUM PO   cephALEXin 500 MG capsule Commonly known as: KEFLEX   Cranberry 1000 MG Caps   donepezil 10 MG tablet Commonly known as: ARICEPT   VITAMIN D PO       TAKE these medications    acetaminophen 325 MG tablet Commonly known as: TYLENOL Take 1-2 tablets (325-650 mg total) by mouth every 4 (four) hours as needed for mild pain.   amantadine 100 MG capsule Commonly known as: SYMMETREL Take 1 capsule (100 mg total) by mouth 2 (two) times daily.   CULTURELLE DIGESTIVE DAILY PO Take 1 tablet by mouth daily.   fluticasone 50 MCG/ACT nasal spray Commonly known as: FLONASE Place 2 sprays into both nostrils daily.   gabapentin 300 MG capsule Commonly known as: NEURONTIN Take 1 cap twice a day   levothyroxine 50 MCG tablet Commonly known as: SYNTHROID TAKE 1 TABLET BY MOUTH EVERY DAY BEFORE BREAKFAST   loratadine 10 MG tablet Commonly known as: CLARITIN TAKE 1 TABLET BY MOUTH EVERY DAY   memantine 10 MG tablet Commonly known as: Namenda Take 1 tablet (10 mg total) by mouth 2 (two) times daily.   midodrine 5 MG tablet Commonly known as: PROAMATINE TAKE 1 TABLET (5 MG TOTAL) BY MOUTH 3 (THREE) TIMES DAILY WITH MEALS.   multivitamin tablet Take 1 tablet by mouth daily.   polyethylene glycol 17 g packet Commonly known as: MIRALAX / GLYCOLAX Take 17 g by mouth daily as needed for mild constipation.   senna-docusate 8.6-50 MG tablet Commonly known as: Senokot-S Take 2 tablets by mouth at bedtime.   sertraline 100 MG tablet Commonly known  as: Zoloft Take 1 tablet (100 mg total) by mouth daily.        Contact information for follow-up providers     Meredith Staggers, MD Follow up.   Specialty: Physical Medicine and Rehabilitation Why: office to call for appointment Contact information: [address] [place] [address] [phone]         Cameron Sprang, MD Follow up.   Specialty: Neurology Why: Call for appointment Contact information: [address] [place] [address] [phone]         Ashok Pall, MD Follow up.   Specialty: Neurosurgery Why: Call for appointment Contact information: [address] [place] [address] [phone]              Contact information for after-discharge care     Destination     [facility] .   Service: Skilled Chiropodist information: [address] [place] [phone]  Signed: Bary Leriche 05/02/2021, 12:48 PM

## 2021-04-25 NOTE — Evaluation (Addendum)
Speech Language Pathology Bedside Swallow Evaluation and Session Note   Patient Details  Name: Michelle [name] MRN: [id number] Date of Birth: July 30, 1946  SLP Diagnosis: Dysphagia  Rehab Potential: Fair ELOS: 04/26/21    Today's Date: 04/25/2021 SLP Individual Time: 7939-0300 SLP Individual Time Calculation (min): 39 min   Hospital Problem: Principal Problem:   Subdural hemorrhage following injury ([place]) Active Problems:   Acute blood loss anemia   UTI due to Klebsiella species   Chronic nonintractable headache  Past Medical History:  Past Medical History:  Diagnosis Date   Adenomatous colon polyp    Allergy    Dementia ([place])    GERD (gastroesophageal reflux disease)    Glaucoma    History of chronic cough    Hx of hearing loss    bilateral hearing aids   Hyperlipidemia    Hypertension    IBS (irritable bowel syndrome)    Low back pain    Thyroid disease    Past Surgical History:  Past Surgical History:  Procedure Laterality Date   ABDOMINAL HYSTERECTOMY  1998   CATARACT EXTRACTION, BILATERAL Bilateral 04/2015   CHOLECYSTECTOMY  1998   COLONOSCOPY  12/10/2016   GUM SURGERY  1996 / 2013   skin graft on gums   TUBAL LIGATION  1977    Assessment / Plan / Recommendation Clinical Impression Patient initially lethargic and required Max A multimodal cues for arousal. Total A was initially needed for feeding with breakfast meal of regular textures with thin liquids, however, it faded to hand over hand assist by end of session. Patient with poor awareness of bolus with intermittent verbal cues needed for mastication due to fatigue resulting in large, prolonged coughing episode with solid textures. No overt s/s of aspiration noted with thin liquids. Due to poor attention and intermittent arousal, recommend patient downgrade to Dys. 3 textures. Spoke to patient's husband regarding recommendations. He verbalized understanding and agreement. Patient left upright in bed with alarm  on and all needs within reach. Continue with current plan of care.    Skilled Therapeutic Interventions          Administered a BSE, please see above for details.   SLP Assessment       Recommendations  SLP Diet Recommendations: Dysphagia 3 (Mech soft);Thin Liquid Administration via: Straw Medication Administration: Crushed with puree Supervision: Full supervision/cueing for compensatory strategies;Staff to assist with self feeding Compensations: Minimize environmental distractions;Slow rate;Small sips/bites Postural Changes and/or Swallow Maneuvers: Seated upright 90 degrees Oral Care Recommendations: Oral care BID Patient destination: [place] [facility] (SNF) Follow up Recommendations: Skilled Nursing facility;24 hour supervision/assistance Equipment Recommended: None recommended by SLP    SLP Frequency 3 to 5 out of 7 days   SLP Duration  SLP Intensity  SLP Treatment/Interventions 04/26/21  Minumum of 1-2 x/day, 30 to 90 minutes  Therapeutic Activities;Cueing hierarchy;Functional tasks;Patient/family education;Internal/external aids;Environmental controls;Dysphagia/aspiration precaution training    Pain PAINAD (Pain Assessment in Advanced Dementia) Breathing: normal Negative Vocalization: none Facial Expression: smiling or inexpressive Body Language: relaxed Consolability: no need to console PAINAD Score: 0  Care Tool Care Tool Cognition Expression of Ideas and Wants Expression of Ideas and Wants: Rarely/Never expressess or very difficult - rarely/never expresses self or speech is very difficult to understand   Understanding Verbal and Non-Verbal Content Understanding Verbal and Non-Verbal Content: Sometimes understands - understands only basic conversations or simple, direct phrases. Frequently requires cues to understand   Memory/Recall Ability *first 3 days only Memory/Recall Ability *first 3 days only:  None of the above were recalled    Bedside Swallowing  Assessment General Date of Onset: 04/25/21 Previous Swallow Assessment: N/A Diet Prior to this Study: Regular;Thin liquids Temperature Spikes Noted: No Respiratory Status: Room air History of Recent Intubation: No Behavior/Cognition: Pleasant mood;Distractible;Requires cueing;Lethargic/Drowsy Oral Cavity - Dentition: Adequate natural dentition Self-Feeding Abilities: Needs assist;Total assist Patient Positioning: Upright in bed Baseline Vocal Quality: Normal Volitional Cough: Cognitively unable to elicit Volitional Swallow: Unable to elicit  Ice Chips Ice chips: Not tested Thin Liquid Thin Liquid: Within functional limits Presentation: Straw Nectar Thick Nectar Thick Liquid: Not tested Honey Thick Honey Thick Liquid: Not tested Puree Puree: Impaired Presentation: Self Fed;Spoon Oral Phase Functional Implications: Prolonged oral transit Solid Solid: Impaired Presentation: Self Fed;Spoon Oral Phase Impairments: Poor awareness of bolus Oral Phase Functional Implications: Impaired mastication;Prolonged oral transit Pharyngeal Phase Impairments: Cough - Immediate Other Comments: X 1 throughout meal BSE Assessment Risk for Aspiration Impact on safety and function: Mild aspiration risk;Moderate aspiration risk Other Related Risk Factors: Cognitive impairment;Lethargy  Short Term Goals: Week 1: SLP Short Term Goal 1 (Week 1): Pt will increase orientation to self, place, time, biographical concepts and recent medical events with max A verbal and visual cues SLP Short Term Goal 1 - Progress (Week 1): Not met SLP Short Term Goal 2 (Week 1): Pt will follow 1-2 step functional directions with mod A cues SLP Short Term Goal 2 - Progress (Week 1): Met SLP Short Term Goal 3 (Week 1): Pt will increase focused attention to 5 minutes provided mod A cues SLP Short Term Goal 3 - Progress (Week 1): Not met SLP Short Term Goal 4 (Week 1): Pt will answer simple yes/no questions with 75%  accuracy min A SLP Short Term Goal 4 - Progress (Week 1): Met SLP Short Term Goal 5 (Week 1): Pt will demonstrate simple problem solving for functional tasks 50% of opportunities provided mod A cues SLP Short Term Goal 5 - Progress (Week 1): Not met  Refer to Care Plan for Long Term Goals  Recommendations for other services: None   Discharge Criteria: Patient will be discharged from SLP if patient refuses treatment 3 consecutive times without medical reason, if treatment goals not met, if there is a change in medical status, if patient makes no progress towards goals or if patient is discharged from hospital.  The above assessment, treatment plan, treatment alternatives and goals were discussed and mutually agreed upon: by family  Michelle [name] 04/25/2021, 11:54 AM

## 2021-04-26 NOTE — Progress Notes (Signed)
Patient ID: NHU GLASBY, female   DOB: 30-Oct-1945, 75 y.o.   MRN: [id number]  SW met with pt husband in room to discuss placement with [facility]. Reports he would like to move forward with placement. SW informed will work on [employer] stream[employer] to submit pre-auth request.   SW spoke with [facility] [phone]; Ref CO#[id number] to submit SNF pre-auth request and if approved for it to be effective 8/21. Request for updated clinicals. SW faxed clinicals and waiting on follow-up.   Loralee Pacas, MSW, [place] Office: [phone] Cell: [phone] Fax: [phone]

## 2021-04-26 NOTE — Plan of Care (Signed)
  Problem: RH Cognition - SLP Goal: RH LTG Patient will demonstrate orientation with cues Description:  LTG:  Patient will demonstrate orientation to person/place/time/situation with cues (SLP)   Outcome: Not Applicable Note: Goal discharged-not appropriate at this time   Problem: RH Comprehension Communication Goal: LTG Patient will comprehend basic/complex auditory (SLP) Description: LTG: Patient will comprehend basic/complex auditory information with cues (SLP). Outcome: Not Applicable Flowsheets (Taken 04/26/2021 0914) LTG: Patient will comprehend auditory information with cueing (SLP): Maximal Assistance - Patient 25 - 49% Note: Goal downgraded due to lack of progress   Problem: RH Expression Communication Goal: LTG Patient will express needs/wants via multi-modal(SLP) Description: LTG:  Patient will express needs/wants via multi-modal communication (gestures/written, etc) with cues (SLP) Outcome: Not Applicable Flowsheets (Taken 04/26/2021 0914) LTG: Patient will express needs/wants via multimodal communication (gestures/written, etc) with cueing (SLP): Maximal Assistance - Patient 25 - 49% Note: Goal downgraded due to lack of progress   Problem: RH Problem Solving Goal: LTG Patient will demonstrate problem solving for (SLP) Description: LTG:  Patient will demonstrate problem solving for basic/complex daily situations with cues  (SLP) Outcome: Not Applicable Note: Goal discharged-not appropriate at this time   Problem: RH Attention Goal: LTG Patient will demonstrate this level of attention during functional activites (SLP) Description: LTG:  Patient will will demonstrate this level of attention during functional activites (SLP) Outcome: Not Applicable Flowsheets (Taken 04/26/2021 0914) LTG: Patient will demonstrate this level of attention during cognitive/linguistic activities with assistance of (SLP): Moderate Assistance - Patient 50 - 74% Note: Goal downgraded due to lack of  progress

## 2021-04-26 NOTE — Progress Notes (Signed)
Occupational Therapy TBI Note  Patient Details  Name: Michelle [name] MRN: [id number] Date of Birth: 05-Jul-1946  Today's Date: 04/26/2021 OT Individual Time: 1300-1358 OT Individual Time Calculation (min): 58 min    Short Term Goals: Week 1:  OT Short Term Goal 1 (Week 1): Patient will attend to grooming tasks and complete with min cues and CGA in stance OT Short Term Goal 1 - Progress (Week 1): Not met OT Short Term Goal 2 (Week 1): patient will complete upper body bathing and dressing with min A OT Short Term Goal 2 - Progress (Week 1): Not met OT Short Term Goal 3 (Week 1): patient will complete toileting, lower body bathing and dressing with mod A OT Short Term Goal 3 - Progress (Week 1): Not met OT Short Term Goal 4 (Week 1): patient will complete sit to stand and SPT with LRAD min a OT Short Term Goal 4 - Progress (Week 1): Not met  Skilled Therapeutic Interventions/Progress Updates:     Pt received in w/c wth no pain reported/indicated ADL:  Pt completes bathing with A to wash stomach, back, buttocks and RUE. Pt able to wash and sequence BLE and peri area.  Pt completes UB dressing with A to initiate threading BLE and VC for threading head.  Pt completes LB dressing with A to thread 1LE and pt able to thread second LE and pull pants past hips  Pt completes footwear with VC and placing 1LE into figure 4 to pt to don socks with S Pt completes shower/Tub transfer with MIN A for stand pivot to grab bar/shower seat   Pt left at end of session in bed with exit alarm on, call light in reach and all needs met   Therapy Documentation Precautions:  Precautions Precautions: Fall Precaution Comments: HOH Restrictions Weight Bearing Restrictions: No General:   Vital Signs: Therapy Vitals Temp: 98.1 F (36.7 C) Pulse Rate: (!) 55 Resp: 18 BP: 111/71 Patient Position (if appropriate): Lying Oxygen Therapy SpO2: 93 % O2 Device: Room Air Pain:   Agitated Behavior  Scale: TBI  Observation Details Observation Environment: pt room Start of observation period - Date: 04/26/21 Start of observation period - Time: 1300 End of observation period - Date: 04/26/21 End of observation period - Time: 1400 Agitated Behavior Scale (DO NOT LEAVE BLANKS) Short attention span, easy distractibility, inability to concentrate: Present to a moderate degree Impulsive, impatient, low tolerance for pain or frustration: Absent Uncooperative, resistant to care, demanding: Absent Violent and/or threatening violence toward people or property: Absent Explosive and/or unpredictable anger: Absent Rocking, rubbing, moaning, or other self-stimulating behavior: Absent Pulling at tubes, restraints, etc.: Absent Wandering from treatment areas: Absent Restlessness, pacing, excessive movement: Absent Repetitive behaviors, motor, and/or verbal: Present to a slight degree Rapid, loud, or excessive talking: Absent Sudden changes of mood: Absent Easily initiated or excessive crying and/or laughter: Absent Self-abusiveness, physical and/or verbal: Absent Agitated behavior scale total score: 17  ADL: ADL Grooming: Moderate assistance Where Assessed-Grooming: Wheelchair, Sitting at sink Upper Body Bathing: Maximal assistance Where Assessed-Upper Body Bathing: Sitting at sink, Wheelchair Lower Body Bathing: Dependent Where Assessed-Lower Body Bathing: Sitting at sink, Standing at sink, Wheelchair Upper Body Dressing: Maximal assistance Where Assessed-Upper Body Dressing: Wheelchair Lower Body Dressing: Maximal assistance Where Assessed-Lower Body Dressing: Wheelchair Toileting: Dependent Where Assessed-Toileting: Other (Comment) (incontinent of bladder in brief) Vision   Perception    Praxis   Exercises:   Other Treatments:     Therapy/Group: Individual Therapy  [place]  M Jadian Karman 04/26/2021, 6:58 AM

## 2021-04-26 NOTE — Progress Notes (Signed)
Speech Language Pathology Weekly Progress and Session Note  Patient Details  Name: KEYANI RIGDON MRN: [id number] Date of Birth: [birth year]  Beginning of progress report period: April 19, 2021 End of progress report period: April 26, 2021  Today's Date: 04/26/2021 SLP Individual Time: 8016-5537 SLP Individual Time Calculation (min): 55 min  Short Term Goals: Week 2: SLP Short Term Goal 1 (Week 2): Patient will initiate functional tasks with Mod verbal and tactile cues in 75% of opportunities. SLP Short Term Goal 1 - Progress (Week 2): Not met SLP Short Term Goal 2 (Week 2): Patient will demonstrate sustained attention to functional and familiar tasks for 2 minutes with Max A multimodal cues. SLP Short Term Goal 2 - Progress (Week 2): Met SLP Short Term Goal 3 (Week 2): Patient will follow 1-step commands with Min A multimodal cues. SLP Short Term Goal 3 - Progress (Week 2): Not met SLP Short Term Goal 4 (Week 2): Patient will answer basic yes/no questions in regards to wants/needs in 90% of opportunities with Min multimodal cues. SLP Short Term Goal 4 - Progress (Week 2): Not met SLP Short Term Goal 5 (Week 2): Patient will consume current diet with minimal overt s/s of aspiration with Min verbal cues for use of swallowing strategies SLP Short Term Goal 5 - Progress (Week 2): Met    New Short Term Goals: Week 3: SLP Short Term Goal 1 (Week 3): STGS=LTGs due to ELOS  Weekly Progress Updates: Patient has made minimal gains this reporting period and has met 2 of 5 STGs. Patient's diet has been downgraded to Dys. 3 textures due to poor awareness of bolus resulting in intermittent overt s/s of aspiration. Patient continues to demonstrate limited verbal output and only answers yes/no questions ~60% of the time. Patient also continues to require Max-Total A for sustained attention and initiation with functional tasks. Patient and family education ongoing. Patient would benefit from continued  skilled SLP intervention to maximize her cognitive functioning and overall functional independence prior to discharge.      Intensity: Minumum of 1-2 x/day, 30 to 90 minutes Frequency: 3 to 5 out of 7 days Duration/Length of Stay: 04/26/21 Treatment/Interventions: Therapeutic Activities;Cueing hierarchy;Functional tasks;Patient/family education;Internal/external aids;Environmental controls;Dysphagia/aspiration precaution training   Daily Session  Skilled Therapeutic Interventions:  Skilled treatment session focused on dysphagia and cognitive goals. SLP facilitated session by providing extra time and Max A multimodal cues for arousal. Patient required hand over hand assist for self-feeding which improved to Mod verbal cues with yogurt as patient was holding the yogurt in her left hand and the spoon in her right hand. Patient required ~30 minutes to feed  herself ~50% of a ypgurt cup. SLP eventually took over feeding to maximize PO intake. SLP also facilitated session with skilled observation with breakfast meal of dys. 3 textures with thin liquids. Patient consumed meal without overt s/s of aspiration. Recommend patient continue current diet. Patient left upright in bed with alarm on and all needs within reach. Continue with current plan of care.    Pain: No/Denies Pain  Therapy/Group: Individual Therapy  Ajai Terhaar 04/26/2021, 9:21 AM

## 2021-04-26 NOTE — Progress Notes (Signed)
PROGRESS NOTE   Subjective/Complaints: No new problems reported by nursing overnight  ROS: limited due to language/communication   Objective:   No results found. No results for input(s): WBC, HGB, HCT, PLT in the last 72 hours.  No results for input(s): NA, K, CL, CO2, GLUCOSE, BUN, CREATININE, CALCIUM in the last 72 hours.   Intake/Output Summary (Last 24 hours) at 04/26/2021 1030 Last data filed at 04/25/2021 1700 Gross per 24 hour  Intake 180 ml  Output --  Net 180 ml         Physical Exam: Vital Signs Blood pressure (!) 104/50, pulse (!) 59, temperature 98.1 F (36.7 C), resp. rate 18, height '5\' 3"'$  (1.6 m), weight 61.5 kg, SpO2 100 %. Constitutional: No distress . Vital signs reviewed. HEENT: NCAT, EOMI, oral membranes moist Neck: supple Cardiovascular: RRR without murmur. No JVD    Respiratory/Chest: CTA Bilaterally without wheezes or rales. Normal effort    GI/Abdomen: BS +, non-tender, non-distended Ext: no clubbing, cyanosis, or edema Psych: flat Skin: intact Musc: No edema in extremities.  No tenderness in extremities. Neuro: Alert Motor: Spontaneously moving bilateral upper extremities. Makes good eye contact. When she hears does respond to verbal cues and answers basic questions. Continued delays in processing, initiation HOH but able to communicate better when both hearing aids are in Limited insight and awareness.   Assessment/Plan: 1. Functional deficits which require 3+ hours per day of interdisciplinary therapy in a comprehensive inpatient rehab setting. Physiatrist is providing close team supervision and 24 hour management of active medical problems listed below. Physiatrist and rehab team continue to assess barriers to discharge/monitor patient progress toward functional and medical goals  Care Tool:  Bathing    Body parts bathed by patient: Face, Front perineal area, Right upper leg, Left  upper leg   Body parts bathed by helper: Right arm, Left arm, Chest, Abdomen, Buttocks, Right lower leg, Left lower leg     Bathing assist Assist Level: Maximal Assistance - Patient 24 - 49%     Upper Body Dressing/Undressing Upper body dressing   What is the patient wearing?: Pull over shirt    Upper body assist Assist Level: Maximal Assistance - Patient 25 - 49%    Lower Body Dressing/Undressing Lower body dressing      What is the patient wearing?: Underwear/pull up, Pants     Lower body assist Assist for lower body dressing: Maximal Assistance - Patient 25 - 49%     Toileting Toileting    Toileting assist Assist for toileting: Total Assistance - Patient < 25%     Transfers Chair/bed transfer  Transfers assist     Chair/bed transfer assist level: Moderate Assistance - Patient 50 - 74% Chair/bed transfer assistive device: Programmer, multimedia   Ambulation assist      Assist level: Maximal Assistance - Patient 25 - 49% Assistive device: No Device Max distance: 10'   Walk 10 feet activity   Assist     Assist level: Moderate Assistance - Patient - 50 - 74% Assistive device: Walker-rolling   Walk 50 feet activity   Assist    Assist level: Moderate Assistance - Patient -  50 - 74% Assistive device: Walker-rolling    Walk 150 feet activity   Assist Walk 150 feet activity did not occur: Safety/medical concerns         Walk 10 feet on uneven surface  activity   Assist Walk 10 feet on uneven surfaces activity did not occur: Safety/medical concerns         Wheelchair     Assist Will patient use wheelchair at discharge?: No             Wheelchair 50 feet with 2 turns activity    Assist            Wheelchair 150 feet activity     Assist          Blood pressure (!) 104/50, pulse (!) 59, temperature 98.1 F (36.7 C), resp. rate 18, height '5\' 3"'$  (1.6 m), weight 61.5 kg, SpO2 100 %.  Medical  Problem List and Plan: 1.  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   -Continue CIR therapies including PT, OT, and SLP ---husband is having cardioversion today. Plan is now for SNF  -discussed prognosis, TBI issues with husband 8/18 2.  Antithrombotics: -DVT/anticoagulation:  Mechanical: Sequential compression devices, below knee Bilateral lower extremities             -antiplatelet therapy: N/A 3. Pain Management: Tylenol prn  -gabapentin for chronic headache  Appears to be controlled on 8/19 4. Mood: LCSW to follow for evaluation and support.              -antipsychotic agents: N/a 5. Neuropsych: This patient is not capable of making decisions on her own behalf. 6. Skin/Wound Care: Routine pressure relief measures.  7. Fluids/Electrolytes/Nutrition: Monitor I/Os.             -PO intake reasonable -continue to encourage  BMP 8/15 reviewed and is WNL 8. Kleb pneumo UTI: S to keflex--continue x 4 days total ( in addition to 3d rocephin), completed 8/10 9. Alzheimer's dementia:Continue Namenda and aricept             --Zoloft for mood stabilization             -continue sleep chart 10 Hypothyroid: On supplement. 11. Acute blood loss anemia:   Hemoglobin 11.5 on 8/15 12. Orthostatic Hypotension: Encourage fluid intake. Continue ProAmatine 5 mg tid.   Vitals:   04/26/21 0444 04/26/21 0915  BP: 111/71 (!) 104/50  Pulse: (!) 55 (!) 59  Resp: 18 18  Temp: 98.1 F (36.7 C)   SpO2: 93% 100%   8/19 bp stable. Continue midodrine as above  LOS: 14 days A FACE TO [place] 04/26/2021, 10:30 AM

## 2021-04-26 NOTE — Progress Notes (Signed)
Physical Therapy Session Note  Patient Details  Name: Michelle [name] MRN: [id number] Date of Birth: 1945-12-07  Today's Date: 04/26/2021 PT Individual Time: 1103-1200 PT Individual Time Calculation (min): 57 min   Short Term Goals: Week 2:  PT Short Term Goal 1 (Week 2): Pt will perform supine<>sit with Min assist PT Short Term Goal 2 (Week 2): Pt will perform sit<>stands with min assist PT Short Term Goal 3 (Week 2): Pt will perform bed<>chair transfers with min assist PT Short Term Goal 4 (Week 2): Pt will ambulate at least 54f using LRAD with min assist PT Short Term Goal 5 (Week 2): Pt will navigate up/down 4 steps using B HRs with min assist  Skilled Therapeutic Interventions/Progress Updates: Pt presents sitting in w/c w/ daughter present.  Pt does not resist participating in therapy.  Daughter grateful for assistance.  Majority of session spent in family education for improving pt performance w/ ADLs, transfers as daughter states was PTA.  Educated on gaining pt attention, 1 step-commands, and then giving time to process/perform.  Pt may then need tactile cues to initiate.  Pt often answers "OK" but does not initiate.  Pt transfers sit to stand multiple times w/ mod A and HHA, after asked "stand up" and then pt initiates but stops.  Pt amb w/ hands on PT shoulder w/ cueing for posture and step length.  Pt also amb w/ HHA of 2 w/ improved posture.  Spouse then present and pt more animated.  Education given to spouse as well for improved performance.  Pt remained sitting in w/c w/ family members present, chair alarm on.     Therapy Documentation Precautions:  Precautions Precautions: Fall Precaution Comments: HOH Restrictions Weight Bearing Restrictions: No General:   Vital Signs: Therapy Vitals Pulse Rate: (!) 59 Resp: 18 BP: (!) 104/50 Oxygen Therapy SpO2: 100 % Pain: appears pain-free. Pain Assessment Pain Scale: Faces Pain Score: 0-No  pain Mobility:     Therapy/Group: Individual Therapy  JLadoris Gene8/19/2022, 12:20 PM

## 2021-04-26 NOTE — Plan of Care (Signed)
  Problem: RH Comprehension Communication Goal: LTG Patient will comprehend basic/complex auditory (SLP) Description: LTG: Patient will comprehend basic/complex auditory information with cues (SLP). Flowsheets (Taken 04/26/2021 [phone]) LTG: Patient will comprehend auditory information with cueing (SLP): Maximal Assistance - Patient 25 - 49% Note: Goal re-established due to error   Problem: RH Expression Communication Goal: LTG Patient will express needs/wants via multi-modal(SLP) Description: LTG:  Patient will express needs/wants via multi-modal communication (gestures/written, etc) with cues (SLP) Flowsheets (Taken 04/26/2021 0918) LTG: Patient will express needs/wants via multimodal communication (gestures/written, etc) with cueing (SLP): Minimal Assistance - Patient > 75% Note: Goal re-established due to error   Problem: RH Attention Goal: LTG Patient will demonstrate this level of attention during functional activites (SLP) Description: LTG:  Patient will will demonstrate this level of attention during functional activites (SLP) Flowsheets (Taken 04/26/2021 [phone]) Patient will demonstrate during cognitive/linguistic activities the attention type of: Focused LTG: Patient will demonstrate this level of attention during cognitive/linguistic activities with assistance of (SLP): Moderate Assistance - Patient 50 - 74% Note: Goal re-established due to error

## 2021-04-26 NOTE — Plan of Care (Signed)
Pt's plan of care adjusted to QD after speaking with care team and discussed with MD in team conference as pt currently unable to tolerate current therapy schedule with OT, PT, and SLP.   

## 2021-04-26 NOTE — Progress Notes (Signed)
Physical Therapy TBI Note  Patient Details  Name: Michelle [name] MRN: [id number] Date of Birth: April 16, 1946  Today's Date: 04/26/2021 PT Individual Time: [id number] PT Individual Time Calculation (min): 33 min   Short Term Goals: Week 2:  PT Short Term Goal 1 (Week 2): Pt will perform supine<>sit with Min assist PT Short Term Goal 2 (Week 2): Pt will perform sit<>stands with min assist PT Short Term Goal 3 (Week 2): Pt will perform bed<>chair transfers with min assist PT Short Term Goal 4 (Week 2): Pt will ambulate at least 53f using LRAD with min assist PT Short Term Goal 5 (Week 2): Pt will navigate up/down 4 steps using B HRs with min assist  Skilled Therapeutic Interventions/Progress Updates: Pt presented in bed awake with dgt present. Pt agreeable to OOB to w/c. Pt noted to have incontinent brief. Pt performed supine to sit with modA requiring increased time and tactile cues. Once seated at EOB pt with noted posterior lean but was able to correct if PTA told pt to "reach for my shoulders". PTA donned new brief, pants, socks, shoes total A for time management. Pt required modA for STS from EOB with HHA. As PTA removing brief with pt incontinent episode of bowel and bladder. Pt returned to sitting and PTA obtained washcloths while dgt guarded pt to avoid falling backwards while sitting on bed. PTA obtained RW and assisted pt to standing with minA to allow PTA to perform peri-care and complete LB dressing total A. Pt's dgt assisting by calling pt's name to have pt look at dgt to maintain erect posture. PTA then removed RW and completed stand pivot transfer to w/c with HHA and mod to maxA. Pt was able to scoot back in w/c with increased time and verbal cues. Per dgt's request changed shirt total A. Pt left in w/c at end of session with belt alarm on, call bell within reach and dgt present.      Therapy Documentation Precautions:  Precautions Precautions: Fall Precaution Comments:  HOH Restrictions Weight Bearing Restrictions: No General:   Vital Signs: Therapy Vitals Pulse Rate: (!) 59 Resp: 18 BP: (!) 104/50 Oxygen Therapy SpO2: 100 % Pain: Pain Assessment Pain Scale: Faces Pain Score: 0-No pain Agitated Behavior Scale: TBI Observation Details Observation Environment: Pt room Start of observation period - Date: 04/26/21 Start of observation period - Time: 0945 End of observation period - Date: 04/26/21 End of observation period - Time: 1018 Agitated Behavior Scale (DO NOT LEAVE BLANKS) Short attention span, easy distractibility, inability to concentrate: Present to a moderate degree Impulsive, impatient, low tolerance for pain or frustration: Absent Uncooperative, resistant to care, demanding: Absent Violent and/or threatening violence toward people or property: Absent Explosive and/or unpredictable anger: Absent Rocking, rubbing, moaning, or other self-stimulating behavior: Absent Pulling at tubes, restraints, etc.: Absent Wandering from treatment areas: Absent Restlessness, pacing, excessive movement: Absent Repetitive behaviors, motor, and/or verbal: Present to a slight degree Rapid, loud, or excessive talking: Absent Sudden changes of mood: Absent Easily initiated or excessive crying and/or laughter: Absent Self-abusiveness, physical and/or verbal: Absent Agitated behavior scale total score: 17    Therapy/Group: Individual Therapy  Reizel Calzada Drevin Ortner, PTA  04/26/2021, 12:20 PM

## 2021-04-27 NOTE — Progress Notes (Signed)
Pt. LBM 04/21/21 as recorded. Pt. not eating good per day shift nurse Tilford Pillar, RN). PRN Miralax given at 1913.

## 2021-04-28 DIAGNOSIS — S065X0S Traumatic subdural hemorrhage without loss of consciousness, sequela: Secondary | ICD-10-CM

## 2021-04-28 MED ORDER — SENNOSIDES-DOCUSATE SODIUM 8.6-50 MG PO TABS
2.0000 | ORAL_TABLET | Freq: Every day | ORAL | Status: DC
  Administered 2021-04-28 – 2021-05-02 (×5): 2 via ORAL
  Filled 2021-04-28 (×5): qty 2

## 2021-04-28 NOTE — Progress Notes (Signed)
Physical Therapy Weekly Progress Note  Patient Details  Name: Michelle [name] MRN: [id number] Date of Birth: [birth year]  Beginning of progress report period: April 20, 2021 End of progress report period: April 28, 2021  Patient has met 1 of 3 short term goals.  Patient with limited progress this week due to deficits in arousal, attention, motor planning and initiation leading to inconsistency with performance in mobility. Patient currently requires min-mod A for bed mobility, transfers, gait with B HHA up to 15 ft, and min A up 4 steps with B rails.   Patient continues to demonstrate the following deficits muscle weakness and muscle joint tightness, decreased cardiorespiratoy endurance, impaired timing and sequencing, motor apraxia, decreased coordination, and decreased motor planning, decreased visual perceptual skills, decreased midline orientation and decreased motor planning, decreased initiation, decreased attention, decreased awareness, decreased problem solving, decreased safety awareness, decreased memory, and delayed processing, and decreased sitting balance, decreased standing balance, decreased postural control, and decreased balance strategies and therefore will continue to benefit from skilled PT intervention to increase functional independence with mobility.  Patient not progressing toward long term goals.  See goal revision. Plan of care revisions: Downgraded patient's goals to min A and d/c plan now SNF placement due to patient's limited progress and decreased caregiver support available at home.  PT Short Term Goals Week 2:  PT Short Term Goal 1 (Week 2): Pt will perform supine<>sit with Min assist PT Short Term Goal 1 - Progress (Week 2): Met PT Short Term Goal 2 (Week 2): Pt will perform sit<>stands with min assist PT Short Term Goal 2 - Progress (Week 2): Met PT Short Term Goal 3 (Week 2): Pt will perform bed<>chair transfers with min assist PT Short Term Goal 3 - Progress  (Week 2): Progressing toward goal PT Short Term Goal 4 (Week 2): Pt will ambulate at least 3f using LRAD with min assist PT Short Term Goal 4 - Progress (Week 2): Progressing toward goal PT Short Term Goal 5 (Week 2): Pt will navigate up/down 4 steps using B HRs with min assist PT Short Term Goal 5 - Progress (Week 2): Met Week 3:  PT Short Term Goal 1 (Week 3): Patient will perform basic transfers with min A >75% of the time. PT Short Term Goal 2 (Week 3): Patient will ambulate 50 ft using LRAD with mod A. PT Short Term Goal 3 (Week 3): Patient will perform bed mobility with min A >75% of the time.   Therapy Documentation Precautions:  Precautions Precautions: Fall Precaution Comments: HOH Restrictions Weight Bearing Restrictions: No   Aqeel Norgaard L Carmita Boom PT, DPT  04/28/2021, 4:35 PM

## 2021-04-28 NOTE — Plan of Care (Signed)
Problem: RH Balance Goal: LTG: Patient will maintain dynamic sitting balance (OT) Description: LTG:  Patient will maintain dynamic sitting balance with assistance during activities of daily living (OT) Flowsheets (Taken 04/28/2021 1206) LTG: Pt will maintain dynamic sitting balance during ADLs with: Minimal Assistance - Patient > 75% Note: Downgraded d/t progress/cognition SMS OT 8/21 Goal: LTG Patient will maintain dynamic standing with ADLs (OT) Description: LTG:  Patient will maintain dynamic standing balance with assist during activities of daily living (OT)  Flowsheets (Taken 04/28/2021 1206) LTG: Pt will maintain dynamic standing balance during ADLs with: Minimal Assistance - Patient > 75% Note: Downgraded d/t progress/cognition SMS OT 8/21   Problem: Sit to Stand Goal: LTG:  Patient will perform sit to stand in prep for activites of daily living with assistance level (OT) Description: LTG:  Patient will perform sit to stand in prep for activites of daily living with assistance level (OT) Flowsheets (Taken 04/28/2021 1206) LTG: PT will perform sit to stand in prep for activites of daily living with assistance level: Minimal Assistance - Patient > 75% Note: Downgraded d/t progress/cognition SMS OT 8/21   Problem: RH Eating Goal: LTG Patient will perform eating w/assist, cues/equip (OT) Description: LTG: Patient will perform eating with assist, with/without cues using equipment (OT) Flowsheets (Taken 04/28/2021 1206) LTG: Pt will perform eating with assistance level of: Minimal Assistance - Patient > 75% Note: Downgraded d/t progress/cognition SMS OT 8/21   Problem: RH Grooming Goal: LTG Patient will perform grooming w/assist,cues/equip (OT) Description: LTG: Patient will perform grooming with assist, with/without cues using equipment (OT) Flowsheets (Taken 04/28/2021 1206) LTG: Pt will perform grooming with assistance level of: Moderate Assistance - Patient 50 - 74% Note: Downgraded  d/t progress/cognition SMS OT 8/21   Problem: RH Bathing Goal: LTG Patient will bathe all body parts with assist levels (OT) Description: LTG: Patient will bathe all body parts with assist levels (OT) Flowsheets (Taken 04/28/2021 1206) LTG: Pt will perform bathing with assistance level/cueing: Moderate Assistance - Patient 50 - 74% Note: Downgraded d/t progress/cognition SMS OT 8/21   Problem: RH Dressing Goal: LTG Patient will perform upper body dressing (OT) Description: LTG Patient will perform upper body dressing with assist, with/without cues (OT). Flowsheets (Taken 04/28/2021 1206) LTG: Pt will perform upper body dressing with assistance level of: Minimal Assistance - Patient > 75% Note: Downgraded d/t progress/cognition SMS OT 8/21 Goal: LTG Patient will perform lower body dressing w/assist (OT) Description: LTG: Patient will perform lower body dressing with assist, with/without cues in positioning using equipment (OT) Flowsheets (Taken 04/28/2021 1206) LTG: Pt will perform lower body dressing with assistance level of: Moderate Assistance - Patient 50 - 74% Note: Downgraded d/t progress/cognition SMS OT 8/21   Problem: RH Toileting Goal: LTG Patient will perform toileting task (3/3 steps) with assistance level (OT) Description: LTG: Patient will perform toileting task (3/3 steps) with assistance level (OT)  Flowsheets (Taken 04/28/2021 1206) LTG: Pt will perform toileting task (3/3 steps) with assistance level: Minimal Assistance - Patient > 75% Note: Downgraded d/t progress/cognition SMS OT 8/21   Problem: RH Toileting Goal: LTG Patient will perform toileting task (3/3 steps) with assistance level (OT) Description: LTG: Patient will perform toileting task (3/3 steps) with assistance level (OT)  Flowsheets (Taken 04/28/2021 1206) LTG: Pt will perform toileting task (3/3 steps) with assistance level: Minimal Assistance - Patient > 75% Note: Downgraded d/t progress/cognition SMS OT  8/21   Problem: RH Toilet Transfers Goal: LTG Patient will perform toilet transfers w/assist (OT)  Description: LTG: Patient will perform toilet transfers with assist, with/without cues using equipment (OT) Flowsheets (Taken 04/28/2021 1206) LTG: Pt will perform toilet transfers with assistance level of: Minimal Assistance - Patient > 75% Note: Downgraded d/t progress/cognition SMS OT 8/21   Problem: RH Tub/Shower Transfers Goal: LTG Patient will perform tub/shower transfers w/assist (OT) Description: LTG: Patient will perform tub/shower transfers with assist, with/without cues using equipment (OT) Flowsheets (Taken 04/28/2021 1206) LTG: Pt will perform tub/shower stall transfers with assistance level of: Minimal Assistance - Patient > 75%

## 2021-04-28 NOTE — Plan of Care (Signed)
Problem: RH Car Transfers Goal: LTG Patient will perform car transfers with assist (PT) Description: LTG: Patient will perform car transfers with assistance (PT). Outcome: Not Applicable Flowsheets (Taken 04/28/2021 1645) LTG: Pt will perform car transfers with assist:: (No longer applies, as patient is to d/c to SNF.) -- Note: No longer applies, as patient is to d/c to SNF.   Problem: RH Balance Goal: LTG Patient will maintain dynamic sitting balance (PT) Description: LTG:  Patient will maintain dynamic sitting balance with assistance during mobility activities (PT) Flowsheets (Taken 04/28/2021 1645) LTG: Pt will maintain dynamic sitting balance during mobility activities with:: (Downgraded goal due to limited progress secondary to cognitive and motor planning deficits.) Minimal Assistance - Patient > 75% Note: Downgraded goal due to limited progress secondary to cognitive and motor planning deficits. Goal: LTG Patient will maintain dynamic standing balance (PT) Description: LTG:  Patient will maintain dynamic standing balance with assistance during mobility activities (PT) Flowsheets (Taken 04/28/2021 1645) LTG: Pt will maintain dynamic standing balance during mobility activities with:: (Downgraded goal due to limited progress secondary to cognitive and motor planning deficits.) Minimal Assistance - Patient > 75% Note: Downgraded goal due to limited progress secondary to cognitive and motor planning deficits.   Problem: Sit to Stand Goal: LTG:  Patient will perform sit to stand with assistance level (PT) Description: LTG:  Patient will perform sit to stand with assistance level (PT) Flowsheets (Taken 04/28/2021 1645) LTG: PT will perform sit to stand in preparation for functional mobility with assistance level: (Downgraded goal due to limited progress secondary to cognitive and motor planning deficits.) Minimal Assistance - Patient > 75% Note: Downgraded goal due to limited progress  secondary to cognitive and motor planning deficits.   Problem: RH Bed Mobility Goal: LTG Patient will perform bed mobility with assist (PT) Description: LTG: Patient will perform bed mobility with assistance, with/without cues (PT). Flowsheets (Taken 04/28/2021 1645) LTG: Pt will perform bed mobility with assistance level of: (Downgraded goal due to limited progress secondary to cognitive and motor planning deficits.) Minimal Assistance - Patient > 75% Note: Downgraded goal due to limited progress secondary to cognitive and motor planning deficits.   Problem: RH Bed to Chair Transfers Goal: LTG Patient will perform bed/chair transfers w/assist (PT) Description: LTG: Patient will perform bed to chair transfers with assistance (PT). Flowsheets (Taken 04/28/2021 1645) LTG: Pt will perform Bed to Chair Transfers with assistance level: (Downgraded goal due to limited progress secondary to cognitive and motor planning deficits.) Minimal Assistance - Patient > 75% Note: Downgraded goal due to limited progress secondary to cognitive and motor planning deficits.   Problem: RH Furniture Transfers Goal: LTG Patient will perform furniture transfers w/assist (OT/PT) Description: LTG: Patient will perform furniture transfers  with assistance (OT/PT). Flowsheets (Taken 04/28/2021 1645) LTG: Pt will perform furniture transfers with assist:: (Downgraded goal due to limited progress secondary to cognitive and motor planning deficits.) Moderate Assistance - Patient 50 - 74% Note: Downgraded goal due to limited progress secondary to cognitive and motor planning deficits.   Problem: RH Ambulation Goal: LTG Patient will ambulate in controlled environment (PT) Description: LTG: Patient will ambulate in a controlled environment, # of feet with assistance (PT). Flowsheets (Taken 04/28/2021 1645) LTG: Pt will ambulate in controlled environ  assist needed:: (Downgraded goal due to limited progress secondary to cognitive  and motor planning deficits.) Minimal Assistance - Patient > 75% LTG: Ambulation distance in controlled environment: 50 ft using LRAD Note: Downgraded goal due to limited progress secondary  to cognitive and motor planning deficits. Goal: LTG Patient will ambulate in home environment (PT) Description: LTG: Patient will ambulate in home environment, # of feet with assistance (PT). Flowsheets (Taken 04/28/2021 1645) LTG: Pt will ambulate in home environ  assist needed:: (Downgraded goal due to limited progress secondary to cognitive and motor planning deficits.) Minimal Assistance - Patient > 75% LTG: Ambulation distance in home environment: 25 ft using LRAD Note: Downgraded goal due to limited progress secondary to cognitive and motor planning deficits.   Problem: RH Stairs Goal: LTG Patient will ambulate up and down stairs w/assist (PT) Description: LTG: Patient will ambulate up and down # of stairs with assistance (PT) Flowsheets Taken 04/28/2021 1645 by Quintyn Dombek L, PT LTG: Pt will ambulate up/down stairs assist needed:: (Downgraded goal due to limited progress secondary to cognitive and motor planning deficits.) Minimal Assistance - Patient > 75% Taken 04/13/2021 2000 by Page Spiro M, PT LTG: Pt will  ambulate up and down number of stairs: 4 steps using BHRs Note: Downgraded goal due to limited progress secondary to cognitive and motor planning deficits.

## 2021-04-28 NOTE — Progress Notes (Signed)
Occupational Therapy Weekly Progress Note  Patient Details  Name: Michelle [name] MRN: [id number] Date of Birth: [birth year]  Beginning of progress report period: April 19, 2021 End of progress report period: April 28, 2021  Patient has met 2 of 3 short term goals.  Pt continues to make slow progress towards goals d/t cognitive deficits. Pt requires MIN-MOD A for UB ADLs and MOD A for LB ADLS, MIN A for toileting transfers and MOD-MAX A for toileting components. Pt continues to demo profound deficits in attention and initiation impacting performance of BADLs.   Patient continues to demonstrate the following deficits: muscle weakness, decreased cardiorespiratoy endurance, impaired timing and sequencing, decreased initiation, decreased attention, decreased awareness, decreased problem solving, decreased safety awareness, decreased memory, and delayed processing, and decreased sitting balance, decreased standing balance, decreased postural control, and decreased balance strategies and therefore will continue to benefit from skilled OT intervention to enhance overall performance with BADL.  Patient not progressing toward long term goals.  See goal revision..  Plan of care revisions: Pt goals downgraded to min-mod A level in prep for DC to SNF.  OT Short Term Goals Week 2:  OT Short Term Goal 1 (Week 2): Pt will complete 1 familiar ADL task with no more than mod cueing for initiation OT Short Term Goal 2 (Week 2): Pt will complete UB dressing with mod A consistently for 3 sessions OT Short Term Goal 3 (Week 2): Pt will initiate 50% of commands during sessions Week 3:        Michelle [name] 04/28/2021, 12:09 PM

## 2021-04-28 NOTE — Progress Notes (Signed)
PROGRESS NOTE   Subjective/Complaints:  Up in chair and alert Cannot state her name Answers yes to all questions   ROS: limited due to language/communication   Objective:   No results found. No results for input(s): WBC, HGB, HCT, PLT in the last 72 hours.  No results for input(s): NA, K, CL, CO2, GLUCOSE, BUN, CREATININE, CALCIUM in the last 72 hours.   Intake/Output Summary (Last 24 hours) at 04/28/2021 0847 Last data filed at 04/27/2021 1826 Gross per 24 hour  Intake 415 ml  Output --  Net 415 ml          Physical Exam: Vital Signs Blood pressure 97/65, pulse (!) 54, temperature 97.7 F (36.5 C), temperature source Oral, resp. rate 16, height '5\' 3"'$  (1.6 m), weight 61.5 kg, SpO2 98 %.  General: No acute distress Mood and affect are appropriate Heart: Regular rate and rhythm no rubs murmurs or extra sounds Lungs: Clear to auscultation, breathing unlabored, no rales or wheezes Abdomen: Positive bowel sounds, soft nontender to palpation, nondistended Extremities: No clubbing, cyanosis, or edema Skin: No evidence of breakdown, no evidence of rash  Musc: No edema in extremities.  No tenderness in extremities. Neuro: Alert Motor: Spontaneously moving bilateral upper extremities. Makes good eye contact. When she hears does respond to verbal cues and answers basic questions. Continued delays in processing, initiation HOH but able to communicate better when both hearing aids are in Limited insight and awareness.   Assessment/Plan: 1. Functional deficits which require 3+ hours per day of interdisciplinary therapy in a comprehensive inpatient rehab setting. Physiatrist is providing close team supervision and 24 hour management of active medical problems listed below. Physiatrist and rehab team continue to assess barriers to discharge/monitor patient progress toward functional and medical goals  Care  Tool:  Bathing    Body parts bathed by patient: Face, Front perineal area, Right upper leg, Left upper leg   Body parts bathed by helper: Right arm, Left arm, Chest, Abdomen, Buttocks, Right lower leg, Left lower leg     Bathing assist Assist Level: Maximal Assistance - Patient 24 - 49%     Upper Body Dressing/Undressing Upper body dressing   What is the patient wearing?: Pull over shirt    Upper body assist Assist Level: Maximal Assistance - Patient 25 - 49%    Lower Body Dressing/Undressing Lower body dressing      What is the patient wearing?: Underwear/pull up, Pants     Lower body assist Assist for lower body dressing: Maximal Assistance - Patient 25 - 49%     Toileting Toileting    Toileting assist Assist for toileting: Total Assistance - Patient < 25%     Transfers Chair/bed transfer  Transfers assist     Chair/bed transfer assist level: Moderate Assistance - Patient 50 - 74% Chair/bed transfer assistive device: Museum/gallery exhibitions officer assist      Assist level: Maximal Assistance - Patient 25 - 49% Assistive device: Hand held assist Max distance: 13   Walk 10 feet activity   Assist     Assist level: Moderate Assistance - Patient - 50 - 74% Assistive device: Hand held  assist   Walk 50 feet activity   Assist    Assist level: Moderate Assistance - Patient - 50 - 74% Assistive device: Walker-rolling    Walk 150 feet activity   Assist Walk 150 feet activity did not occur: Safety/medical concerns         Walk 10 feet on uneven surface  activity   Assist Walk 10 feet on uneven surfaces activity did not occur: Safety/medical concerns         Wheelchair     Assist Will patient use wheelchair at discharge?: No             Wheelchair 50 feet with 2 turns activity    Assist            Wheelchair 150 feet activity     Assist          Blood pressure 97/65, pulse (!) 54,  temperature 97.7 F (36.5 C), temperature source Oral, resp. rate 16, height '5\' 3"'$  (1.6 m), weight 61.5 kg, SpO2 98 %.  Medical Problem List and Plan: 1.  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   -Continue CIR therapies including PT, OT, and SLP ---husband is having cardioversion today. Plan is now for SNF  -underlying dementia limits cognitive recovery  2.  Antithrombotics: -DVT/anticoagulation:  Mechanical: Sequential compression devices, below knee Bilateral lower extremities             -antiplatelet therapy: N/A 3. Pain Management: Tylenol prn  -gabapentin for chronic headache  Appears to be controlled on 8/19 4. Mood: LCSW to follow for evaluation and support.              -antipsychotic agents: N/a 5. Neuropsych: This patient is not capable of making decisions on her own behalf. 6. Skin/Wound Care: Routine pressure relief measures.  7. Fluids/Electrolytes/Nutrition: Monitor I/Os.             -PO intake reasonable -continue to encourage  BMP 8/15 reviewed and is WNL 8. Kleb pneumo UTI: S to keflex--continue x 4 days total ( in addition to 3d rocephin), completed 8/10 9. Alzheimer's dementia:Continue Namenda and aricept             --Zoloft for mood stabilization             -continue sleep chart 10 Hypothyroid: On supplement. 11. Acute blood loss anemia:   Hemoglobin 11.5 on 8/15 12. Orthostatic Hypotension: Encourage fluid intake. Continue ProAmatine 5 mg tid.   Vitals:   04/27/21 1958 04/28/21 0405  BP: (!) 104/44 97/65  Pulse: 63 (!) 54  Resp: 16 16  Temp: 97.7 F (36.5 C) 97.7 F (36.5 C)  SpO2: 99% 98%   8/20 bp stable. Continue midodrine as above  LOS: 16 days A FACE TO [place] [facility] Basim Bartnik 04/28/2021, 8:47 AM

## 2021-04-29 LAB — BASIC METABOLIC PANEL
Anion gap: 4 — ABNORMAL LOW (ref 5–15)
BUN: 16 mg/dL (ref 8–23)
CO2: 30 mmol/L (ref 22–32)
Calcium: 9.1 mg/dL (ref 8.9–10.3)
Chloride: 105 mmol/L (ref 98–111)
Creatinine, Ser: 0.81 mg/dL (ref 0.44–1.00)
GFR, Estimated: >60 mL/min (ref >60)
Glucose, Bld: 92 mg/dL (ref 70–99)
Potassium: 3.7 mmol/L (ref 3.5–5.1)
Sodium: 139 mmol/L (ref 135–145)

## 2021-04-29 LAB — CBC
HCT: 35 % — ABNORMAL LOW (ref 36.0–46.0)
Hemoglobin: 11.5 g/dL — ABNORMAL LOW (ref 12.0–15.0)
MCH: 29.1 pg (ref 26.0–34.0)
MCHC: 32.9 g/dL (ref 30.0–36.0)
MCV: 88.6 fL (ref 80.0–100.0)
Platelets: 292 10*3/uL (ref 150–400)
RBC: 3.95 MIL/uL (ref 3.87–5.11)
RDW: 12.1 % (ref 11.5–15.5)
WBC: 6.9 10*3/uL (ref 4.0–10.5)
nRBC: 0 % (ref 0.0–0.2)

## 2021-04-29 MED ORDER — AMANTADINE HCL 100 MG PO CAPS
100.0000 mg | ORAL_CAPSULE | Freq: Two times a day (BID) | ORAL | Status: DC
  Administered 2021-04-29 – 2021-05-03 (×8): 100 mg via ORAL
  Filled 2021-04-29 (×8): qty 1

## 2021-04-29 NOTE — Progress Notes (Signed)
Patient ID: Michelle [name], female   DOB: 1946/03/10, 75 y.o.   MRN: [id number]  SW received phone call from [facility] [phone] option #8) reporting that a peer to peer was requested, and physician will need to complete by 3pm TODAY with their medical director [phone] option #5). SW informed attending.   *SW informed by attending that pt was denied for SNF and family has the option to appeal per Psychologist, occupational.   SW received phone call from [facility] Pt did not meet criteria for SNF. Family has the option to allow pt to have more time in CIR or appeal: p:[phone]/f:[phone].  SW spoke with pt husband Michelle [name]. SW informed him on above. Family is going to appeal. SW provided contact information.  SW called [facility] [phone]) to speak with [place]/Admissions but she is on vacation. SW informed receptionist on new changes. Reported she will relay information to staff who are back up: [place] and Jess (DON).   Loralee Pacas, MSW, [place] Office: [phone] Cell: [phone] Fax: [phone]

## 2021-04-29 NOTE — Progress Notes (Signed)
PROGRESS NOTE   Subjective/Complaints: Sitting up in bed. Had been working on breakfast.   ROS: limited due to language/communication   Objective:   No results found. Recent Labs    04/29/21 0611  WBC 6.9  HGB 11.5*  HCT 35.0*  PLT 292    Recent Labs    04/29/21 0611  NA 139  K 3.7  CL 105  CO2 30  GLUCOSE 92  BUN 16  CREATININE 0.81  CALCIUM 9.1     Intake/Output Summary (Last 24 hours) at 04/29/2021 0847 Last data filed at 04/28/2021 1817 Gross per 24 hour  Intake 377 ml  Output --  Net 377 ml         Physical Exam: Vital Signs Blood pressure 112/61, pulse (!) 54, temperature 98.8 F (37.1 C), resp. rate 17, height '5\' 3"'$  (1.6 m), weight 61.5 kg, SpO2 96 %.  Constitutional: No distress . Vital signs reviewed. HEENT: NCAT, EOMI, oral membranes moist Neck: supple Cardiovascular: RRR without murmur. No JVD    Respiratory/Chest: CTA Bilaterally without wheezes or rales. Normal effort    GI/Abdomen: BS +, non-tender, non-distended Ext: no clubbing, cyanosis, or edema Psych: flat  Skin: No evidence of breakdown, no evidence of rash Musc: No edema in extremities.  No tenderness in extremities. Neuro: Alert. Makes eye contact. Delayed processing even when she is able to hear Motor: moves all 4's. Senses pain.  HOH, communicates on limited basis with hearing aides in. Limited insight and awareness.   Assessment/Plan: 1. Functional deficits which require 3+ hours per day of interdisciplinary therapy in a comprehensive inpatient rehab setting. Physiatrist is providing close team supervision and 24 hour management of active medical problems listed below. Physiatrist and rehab team continue to assess barriers to discharge/monitor patient progress toward functional and medical goals  Care Tool:  Bathing    Body parts bathed by patient: Face, Front perineal area, Right upper leg, Left upper leg   Body  parts bathed by helper: Right arm, Left arm, Chest, Abdomen, Buttocks, Right lower leg, Left lower leg     Bathing assist Assist Level: Maximal Assistance - Patient 24 - 49%     Upper Body Dressing/Undressing Upper body dressing   What is the patient wearing?: Pull over shirt    Upper body assist Assist Level: Maximal Assistance - Patient 25 - 49%    Lower Body Dressing/Undressing Lower body dressing      What is the patient wearing?: Underwear/pull up, Pants     Lower body assist Assist for lower body dressing: Maximal Assistance - Patient 25 - 49%     Toileting Toileting    Toileting assist Assist for toileting: Total Assistance - Patient < 25%     Transfers Chair/bed transfer  Transfers assist     Chair/bed transfer assist level: Moderate Assistance - Patient 50 - 74% Chair/bed transfer assistive device: Museum/gallery exhibitions officer assist      Assist level: Maximal Assistance - Patient 25 - 49% Assistive device: Hand held assist Max distance: 13   Walk 10 feet activity   Assist     Assist level: Moderate Assistance - Patient -  50 - 74% Assistive device: Hand held assist   Walk 50 feet activity   Assist    Assist level: Moderate Assistance - Patient - 50 - 74% Assistive device: Walker-rolling    Walk 150 feet activity   Assist Walk 150 feet activity did not occur: Safety/medical concerns         Walk 10 feet on uneven surface  activity   Assist Walk 10 feet on uneven surfaces activity did not occur: Safety/medical concerns         Wheelchair     Assist Is the patient using a wheelchair?: No             Wheelchair 50 feet with 2 turns activity    Assist            Wheelchair 150 feet activity     Assist          Blood pressure 112/61, pulse (!) 54, temperature 98.8 F (37.1 C), resp. rate 17, height '5\' 3"'$  (1.6 m), weight 61.5 kg, SpO2 96 %.  Medical Problem List and Plan: 1.   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   -Continue CIR therapies including PT, OT, and SLP ---SNF pending  -therapy QD  2.  Antithrombotics: -DVT/anticoagulation:  Mechanical: Sequential compression devices, below knee Bilateral lower extremities             -antiplatelet therapy: N/A 3. Pain Management: Tylenol prn  -gabapentin for chronic headache  Appears to be controlled on 8/22 4. Mood: LCSW to follow for evaluation and support.              -antipsychotic agents: N/a 5. Neuropsych: This patient is not capable of making decisions on her own behalf. 6. Skin/Wound Care: Routine pressure relief measures.  7. Fluids/Electrolytes/Nutrition: Monitor I/Os.             -PO intake reasonable -continue to encourage  8/22 I personally reviewed the patient's labs today.  WNL 8. Kleb pneumo UTI: S to keflex--continue x 4 days total ( in addition to 3d rocephin), completed 8/10 9. Alzheimer's dementia:Continue Namenda and aricept             --Zoloft for mood stabilization             -continue sleep chart 10 Hypothyroid: On supplement. 11. Acute blood loss anemia:   Hemoglobin 11.5 on 8/22 12. Orthostatic Hypotension: Encourage fluid intake. Continue ProAmatine 5 mg tid.   Vitals:   04/28/21 1935 04/29/21 0327  BP: 105/67 112/61  Pulse: 61 (!) 54  Resp: 16 17  Temp: 98.1 F (36.7 C) 98.8 F (37.1 C)  SpO2: 96% 96%   8/22 bp stable. Continue midodrine as above  LOS: 17 days A FACE TO [place] 04/29/2021, 8:47 AM

## 2021-04-29 NOTE — Progress Notes (Signed)
Speech Language Pathology TBI Note  Patient Details  Name: Michelle [name] MRN: [id number] Date of Birth: [birth year]  Today's Date: 04/29/2021 SLP Individual Time: 0800-0845 SLP Individual Time Calculation (min): 45 min  Short Term Goals: Week 3: SLP Short Term Goal 1 (Week 3): STGS=LTGs due to ELOS  Skilled Therapeutic Interventions:     Pt was seen in room at bedside with breakfast tray. SLP addressed auditory comprehension, expression, and swallowing this date. Pt was alert and interactive this AM. Pt required maximal assist for self-feeding. She benefited from verbal cueing and hand over hand to scoop food and place in mouth. Occasionally, SLP could load spoon and pt would bring spoon to mouth without any cueing for initiation. Pt followed 1-step directions given verbal and gestural cues from therapist. She was unable to answer basic questions without repetition and multiple-choice options; however, with [facility] options, pt was able to select SLP perceived correct choice. Throughout session, pt used thumb up and thumbs down to indicate choices. This was not consistent. Pt was left in room with bed alarm in place. Continue with current POC.   Pain Pain Assessment Pain Scale: Faces Faces Pain Scale: No hurt  Agitated Behavior Scale: TBI Observation Details Observation Environment: pt room Start of observation period - Date: 04/29/21 Start of observation period - Time: 0800 End of observation period - Date: 04/29/21 End of observation period - Time: 0845 Agitated Behavior Scale (DO NOT LEAVE BLANKS) Short attention span, easy distractibility, inability to concentrate: Present to a moderate degree Impulsive, impatient, low tolerance for pain or frustration: Absent Uncooperative, resistant to care, demanding: Absent Violent and/or threatening violence toward people or property: Absent Explosive and/or unpredictable anger: Absent Rocking, rubbing, moaning, or other self-stimulating  behavior: Absent Pulling at tubes, restraints, etc.: Absent Wandering from treatment areas: Absent Restlessness, pacing, excessive movement: Absent Repetitive behaviors, motor, and/or verbal: Absent Rapid, loud, or excessive talking: Absent Sudden changes of mood: Absent Easily initiated or excessive crying and/or laughter: Absent Self-abusiveness, physical and/or verbal: Absent Agitated behavior scale total score: 16  Therapy/Group: Individual Therapy  Verdene Lennert MS, CCC-SLP, CBIS  04/29/2021, 4:15 PM

## 2021-04-29 NOTE — Progress Notes (Signed)
Physical Therapy TBI Note  Patient Details  Name: Michelle [name] MRN: [id number] Date of Birth: 1946/03/15  Today's Date: 04/29/2021 PT Individual Time: 1300-1330 PT Individual Time Calculation (min): 30 min   Short Term Goals: Week 1:  PT Short Term Goal 1 (Week 1): Pt will perform supine<>sit with mod assist PT Short Term Goal 1 - Progress (Week 1): Met PT Short Term Goal 2 (Week 1): Pt will perform sit<>stands with min assist PT Short Term Goal 2 - Progress (Week 1): Progressing toward goal PT Short Term Goal 3 (Week 1): Pt will perform bed<>chair transfers with min assist PT Short Term Goal 3 - Progress (Week 1): Progressing toward goal PT Short Term Goal 4 (Week 1): Pt will ambulate at least 71ft using LRAD with min assist PT Short Term Goal 4 - Progress (Week 1): Progressing toward goal PT Short Term Goal 5 (Week 1): Pt will navigate up/down 4 steps using B HRs with min assist PT Short Term Goal 5 - Progress (Week 1): Progressing toward goal Week 2:  PT Short Term Goal 1 (Week 2): Pt will perform supine<>sit with Min assist PT Short Term Goal 1 - Progress (Week 2): Met PT Short Term Goal 2 (Week 2): Pt will perform sit<>stands with min assist PT Short Term Goal 2 - Progress (Week 2): Met PT Short Term Goal 3 (Week 2): Pt will perform bed<>chair transfers with min assist PT Short Term Goal 3 - Progress (Week 2): Progressing toward goal PT Short Term Goal 4 (Week 2): Pt will ambulate at least 62ft using LRAD with min assist PT Short Term Goal 4 - Progress (Week 2): Progressing toward goal PT Short Term Goal 5 (Week 2): Pt will navigate up/down 4 steps using B HRs with min assist PT Short Term Goal 5 - Progress (Week 2): Met Week 3:  PT Short Term Goal 1 (Week 3): Patient will perform basic transfers with min A >75% of the time. PT Short Term Goal 2 (Week 3): Patient will ambulate 50 ft using LRAD with mod A. PT Short Term Goal 3 (Week 3): Patient will perform bed mobility with min  A >75% of the time.  Skilled Therapeutic Interventions/Progress Updates:  Pt received via hand off from NT, sitting in recliner. No expressions against therapy during session. Pt responds "okay" to every question/instruction. Pt unable to follow lean forward or stand up cuing. When therapist assisted in anterior weight shifting, pt had coughing episode and given tissues. MaxA to maintain seated upright posture.   STS from [place] to facilitate initiation of movement. Multimodal one step cues and Therapist assisted in transitioning UE support to walker.  Gait training 140ft with up to heavy modA to maintain balance 2/2 episodes of shuffle/stutter stepping.  Pt also demonstrated forward trunk and requiring intermittent manual assistance to maintain COG within RW BOS. Utilized RW for ~140ft and then switched to +2 in front for bilateral hand held assist with primary therapist posteriorly to assist with weighting shifting. Pt demonstrated improvement in step size and foot clearance with manual facilitation of weight shifting and decreased instances of shuffle/stutter stepping. Pt easily distracted by others in the hall requiring redirection to gait training, balance, and upright posture.   STS to recliner with MinA for maintaining balance. Sitting in recliner, BLE elevated, call bell within reach, and seatbelt alarm on.   Therapy Documentation Precautions:  Precautions Precautions: Fall Precaution Comments: HOH Restrictions Weight Bearing Restrictions: No Pain: Pain Assessment Pain  Scale: Faces Faces Pain Scale: No hurt Agitated Behavior Scale: TBI Observation Details Observation Environment: pt room/hall Start of observation period - Date: 04/29/21 Start of observation period - Time: 1300 End of observation period - Date: 04/29/21 End of observation period - Time: 1330 Agitated Behavior Scale (DO NOT LEAVE BLANKS) Short attention span, easy distractibility, inability to  concentrate: Present to a moderate degree Impulsive, impatient, low tolerance for pain or frustration: Absent Uncooperative, resistant to care, demanding: Absent Violent and/or threatening violence toward people or property: Absent Explosive and/or unpredictable anger: Absent Rocking, rubbing, moaning, or other self-stimulating behavior: Absent Pulling at tubes, restraints, etc.: Absent Wandering from treatment areas: Absent Restlessness, pacing, excessive movement: Absent Repetitive behaviors, motor, and/or verbal: Absent Rapid, loud, or excessive talking: Absent Sudden changes of mood: Absent Easily initiated or excessive crying and/or laughter: Absent Self-abusiveness, physical and/or verbal: Absent Agitated behavior scale total score: 16    Therapy/Group: Individual Therapy  Bristyn Kulesza, SPT  04/29/2021, 3:57 PM

## 2021-04-29 NOTE — Progress Notes (Signed)
Occupational Therapy Session Note  Patient Details  Name: RHIAN ASEBEDO MRN: [id number] Date of Birth: [birth year]  Today's Date: 04/29/2021 OT Individual Time: 8335-8251 OT Individual Time Calculation (min): 45 min    Short Term Goals: Week 2:  OT Short Term Goal 1 (Week 2): Pt will complete 1 familiar ADL task with no more than mod cueing for initiation OT Short Term Goal 1 - Progress (Week 2): Met OT Short Term Goal 2 (Week 2): Pt will complete UB dressing with mod A consistently for 3 sessions OT Short Term Goal 2 - Progress (Week 2): Met OT Short Term Goal 3 (Week 2): Pt will initiate 50% of commands during sessions OT Short Term Goal 3 - Progress (Week 2): Progressing toward goal Week 3:  OT Short Term Goal 1 (Week 3): STG=LTG d/t ELOS  Skilled Therapeutic Interventions/Progress Updates:    Patient in bed, alert - she is calm and does not appear to be in any distress.  Supine to sitting edge of bed with max A and ongoing cues for trunk/balance (posterior lean this session).  SPT bed to w/c with mod A and cues for direction/attention to task.  SPT to toilet max A, brief soaked with urine, continent of bowel on toilet.  Dependent for pants down, hygiene dependent, max A pants up.  HOH to initiate oral care and hand hygiene - she perseverates on tasks requiring removal of stimulus at times.   Overall max A for upperbody bathing and dressing, max A for lower body bathing and dressing.  SPT to recliner with mod A.  She remained seated in recliner at close of session, seat belt alarm set and call bell in reach.  Nursing present.    Therapy Documentation Precautions:  Precautions Precautions: Fall Precaution Comments: HOH Restrictions Weight Bearing Restrictions: No   Therapy/Group: Individual Therapy  Carlos Levering 04/29/2021, 7:27 AM

## 2021-04-29 NOTE — Progress Notes (Deleted)
Speech Language Pathology Daily Session Note  Patient Details  Name: Michelle [name] MRN: [id number] Date of Birth: 08-09-1946  Today's Date: 04/29/2021 SLP Individual Time: 0800-0845 SLP Individual Time Calculation (min): 45 min  Short Term Goals: Week 3: SLP Short Term Goal 1 (Week 3): STGS=LTGs due to ELOS  Skilled Therapeutic Interventions:   Pt was seen in room at bedside with breakfast tray. SLP addressed auditory comprehension, expression, and swallowing this date. Pt was alert and interactive this AM. Pt required maximal assist for self-feeding. She benefited from verbal cueing and hand over hand to scoop food and place in mouth. Occasionally, SLP could load spoon and pt would bring spoon to mouth without any cueing for initiation. Pt followed 1-step directions given verbal and gestural cues from therapist. She was unable to answer basic questions without repetition and multiple-choice options; however, with [facility] options, pt was able to select SLP perceived correct choice. Throughout session, pt used thumb up and thumbs down to indicate choices. This was not consistent. Pt was left in room with bed alarm in place. Continue with current POC.   Pain  No pain  Therapy/Group: Individual Therapy  Verdene Lennert MS, CCC-SLP, CBIS  04/29/2021, 1:17 PM

## 2021-04-30 ENCOUNTER — Telehealth: Payer: Self-pay

## 2021-04-30 LAB — SARS CORONAVIRUS 2 (TAT 6-24 HRS): SARS Coronavirus 2: NEGATIVE

## 2021-04-30 NOTE — Plan of Care (Signed)
  Problem: RH Expression Communication Goal: LTG Patient will express needs/wants via multi-modal(SLP) Description: LTG:  Patient will express needs/wants via multi-modal communication (gestures/written, etc) with cues (SLP) Flowsheets (Taken 04/30/2021 [phone]) LTG: Patient will express needs/wants via multimodal communication (gestures/written, etc) with cueing (SLP): Maximal Assistance - Patient 25 - 49% Note: Goal downgraded due to lack of progress   Problem: RH Attention Goal: LTG Patient will demonstrate this level of attention during functional activites (SLP) Description: LTG:  Patient will will demonstrate this level of attention during functional activites (SLP) Flowsheets (Taken 04/30/2021 [phone]) Patient will demonstrate during cognitive/linguistic activities the attention type of: Sustained LTG: Patient will demonstrate this level of attention during cognitive/linguistic activities with assistance of (SLP): Maximal Assistance - Patient 25 - 49% Note: Goal downgraded due to lack of progress   Problem: RH Swallowing Goal: LTG Patient will consume least restrictive diet using compensatory strategies with assistance (SLP) Description: LTG:  Patient will consume least restrictive diet using compensatory strategies with assistance (SLP) Flowsheets (Taken 04/30/2021 [phone]) LTG: Pt Patient will consume least restrictive diet using compensatory strategies with assistance of (SLP): Minimal Assistance - Patient > 75% Note: Goal added due to increase s/s of aspiration

## 2021-04-30 NOTE — Progress Notes (Signed)
PROGRESS NOTE   Subjective/Complaints: Up in chair. A little more attentive this morning.   ROS: Limited due to cognitive/behavioral   Objective:   No results found. Recent Labs    04/29/21 0611  WBC 6.9  HGB 11.5*  HCT 35.0*  PLT 292    Recent Labs    04/29/21 0611  NA 139  K 3.7  CL 105  CO2 30  GLUCOSE 92  BUN 16  CREATININE 0.81  CALCIUM 9.1     Intake/Output Summary (Last 24 hours) at 04/30/2021 1151 Last data filed at 04/30/2021 0700 Gross per 24 hour  Intake 360 ml  Output --  Net 360 ml         Physical Exam: Vital Signs Blood pressure (!) 108/58, pulse (!) 57, temperature 97.7 F (36.5 C), temperature source Oral, resp. rate 16, height '5\' 3"'$  (1.6 m), weight 61.5 kg, SpO2 97 %.  Constitutional: No distress . Vital signs reviewed. HEENT: NCAT, EOMI, oral membranes moist Neck: supple Cardiovascular: RRR without murmur. No JVD    Respiratory/Chest: CTA Bilaterally without wheezes or rales. Normal effort    GI/Abdomen: BS +, non-tender, non-distended Ext: no clubbing, cyanosis, or edema Psych: flat, does cooperate with exam Skin: No evidence of breakdown, no evidence of rash Musc: No edema in extremities.  No tenderness in extremities. Neuro: Alert. Makes eye contact. Processing more quickly? Motor: moves all 4's. Senses pain.  HOH, communicates on limited basis with hearing aides in. Limited insight and awareness.   Assessment/Plan: 1. Functional deficits which require 3+ hours per day of interdisciplinary therapy in a comprehensive inpatient rehab setting. Physiatrist is providing close team supervision and 24 hour management of active medical problems listed below. Physiatrist and rehab team continue to assess barriers to discharge/monitor patient progress toward functional and medical goals  Care Tool:  Bathing    Body parts bathed by patient: Face, Front perineal area, Right upper  leg, Left upper leg   Body parts bathed by helper: Right arm, Left arm, Chest, Abdomen, Buttocks, Right lower leg, Left lower leg     Bathing assist Assist Level: Maximal Assistance - Patient 24 - 49%     Upper Body Dressing/Undressing Upper body dressing   What is the patient wearing?: Pull over shirt    Upper body assist Assist Level: Maximal Assistance - Patient 25 - 49%    Lower Body Dressing/Undressing Lower body dressing      What is the patient wearing?: Underwear/pull up, Pants     Lower body assist Assist for lower body dressing: Maximal Assistance - Patient 25 - 49%     Toileting Toileting    Toileting assist Assist for toileting: Total Assistance - Patient < 25%     Transfers Chair/bed transfer  Transfers assist     Chair/bed transfer assist level: Moderate Assistance - Patient 50 - 74% Chair/bed transfer assistive device: Museum/gallery exhibitions officer assist      Assist level: Maximal Assistance - Patient 25 - 49% Assistive device: Hand held assist Max distance: 13   Walk 10 feet activity   Assist     Assist level: Moderate Assistance - Patient -  50 - 74% Assistive device: Hand held assist   Walk 50 feet activity   Assist    Assist level: Moderate Assistance - Patient - 50 - 74% Assistive device: Walker-rolling    Walk 150 feet activity   Assist Walk 150 feet activity did not occur: Safety/medical concerns         Walk 10 feet on uneven surface  activity   Assist Walk 10 feet on uneven surfaces activity did not occur: Safety/medical concerns         Wheelchair     Assist Is the patient using a wheelchair?: No             Wheelchair 50 feet with 2 turns activity    Assist            Wheelchair 150 feet activity     Assist          Blood pressure (!) 108/58, pulse (!) 57, temperature 97.7 F (36.5 C), temperature source Oral, resp. rate 16, height '5\' 3"'$  (1.6 m), weight  61.5 kg, SpO2 97 %.  Medical Problem List and Plan: 1.  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   -Continue CIR therapies including PT, OT, and SLP ---SNF placement denied by insurance, family is appealing  -therapy QD. Trial of amantadine for attention 2.  Antithrombotics: -DVT/anticoagulation:  Mechanical: Sequential compression devices, below knee Bilateral lower extremities             -antiplatelet therapy: N/A 3. Pain Management: Tylenol prn  -gabapentin for chronic headache  Appears to be controlled on 8/23 4. Mood: LCSW to follow for evaluation and support.              -antipsychotic agents: N/a 5. Neuropsych: This patient is not capable of making decisions on her own behalf. 6. Skin/Wound Care: Routine pressure relief measures.  7. Fluids/Electrolytes/Nutrition: Monitor I/Os.             -PO intake reasonable -continue to encourage  8/22 I personally reviewed the patient's labs today.  WNL 8. Kleb pneumo UTI: S to keflex--continue x 4 days total ( in addition to 3d rocephin), completed 8/10 9. Alzheimer's dementia:Continue Namenda and aricept             --Zoloft for mood stabilization             -continue sleep chart 10 Hypothyroid: On supplement. 11. Acute blood loss anemia:   Hemoglobin 11.5 on 8/22 12. Orthostatic Hypotension: Encourage fluid intake. Continue ProAmatine 5 mg tid.   Vitals:   04/29/21 1936 04/30/21 0459  BP: (!) 112/59 (!) 108/58  Pulse: 60 (!) 57  Resp: 16 16  Temp: (!) 97.5 F (36.4 C) 97.7 F (36.5 C)  SpO2: 100% 97%   8/23 bp stable. Continue midodrine as above  LOS: 18 days A FACE TO FACE EVALUATION WAS PERFORMED  Meredith Staggers 04/30/2021, 11:51 AM

## 2021-04-30 NOTE — Progress Notes (Signed)
Patient ID: Michelle [name], female   DOB: Oct 28, 1945, 75 y.o.   MRN: [id number]  SW went by pt room to meet with pt husband at his request, however, he was already gone.   SW called pt husband Butch to inform all clinicals had already been sent, and now waiting for insurance to provide an answer. SW informed will provide updates once informed if a decision has been made.   Loralee Pacas, MSW, [place] Office: [phone] Cell: [phone] Fax: [phone]

## 2021-04-30 NOTE — Patient Care Conference (Signed)
Inpatient RehabilitationTeam Conference and Plan of Care Update Date: 04/30/2021   Time: 10:12 AM    Patient Name: Michelle [name]      Medical Record Number: [id number]  Date of Birth: Feb 20, 1946 Sex: Female         Room/Bed: 4W16C/4W16C-01 Payor Info: Payor: [employer] park [employer] MEDICARE / Plan: [employer] MEDICARE / Product Type: *No Product type* /    Admit Date/Time:  04/12/2021  2:20 PM  Primary Diagnosis:  Subdural hemorrhage following injury [facility])  Hospital Problems: Principal Problem:   Subdural hemorrhage following injury ([place]) Active Problems:   Late onset Alzheimer's dementia with behavioral disturbance ([place])   Acute blood loss anemia   UTI due to Klebsiella species   Chronic nonintractable headache    Expected Discharge Date: Expected Discharge Date:  (SNF)  Team Members Present: Physician leading conference: Dr. Alger Simons Social Worker Present: Loralee Pacas, [place] Nurse Present: Dorthula Nettles, RN PT Present: Apolinar Junes, PT OT Present: Willeen Cass, OT SLP Present: Weston Anna, SLP PPS Coordinator present : Ileana Ladd, PT     Current Status/Progress Goal Weekly Team Focus  Bowel/Bladder   incontentent to bowel and bladder  incontent      Swallow/Nutrition/ Hydration   Dys. 3 textures with thin liquids, Min A  Min A  Tolerance of current diet   ADL's             Mobility   mod-max A overall, ambulating up to 180 ft with +2 assist, limited by deficits in attention, initiation, motor planning, and awareness.  Downgraded to min A overall.  Activity tolerance, motor planning, initiation, functional mobility, gait and stair training, attention, balance, patient/caregiver education   Communication   Max A  Max A  expression of wants/needs, verbal responses   Safety/Cognition/ Behavioral Observations  Max A  Max A  attention and initiation   Pain             Skin   skin intact  to remain free from injury        Discharge Planning:  SNF  placement pending appeal from  family. Possibly may need to take her if appeal is not approved. Pt will then discharge to home with her husband who is primary caregiver. Intermittent support from dtr [place] and grandson.   Team Discussion: Started stimulant, family doing peer to peer with insurance. Incontinent B/B, denies pain. Has advanced dementia. Max assist with bed mobility, strong posterior lean, dependent for hygiene. Has motor planning and decreased attention issues. Can't hold onto tasks. Min assist standing, mobility was better today. SLP downgraded diet to Dys 3. Patient on target to meet rehab goals: no, patient has advance dementia and can't stay on tasks, evening with max cueing.  *See Care Plan and progress notes for long and short-term goals.   Revisions to Treatment Plan:  MD started stimulant and SLP downgraded diet.  Teaching Needs: Family education, medication management, transfer training, gait training, balance training, endurance training, safety awareness.  Current Barriers to Discharge: Decreased caregiver support, Medical stability, Home enviroment access/layout, Incontinence, Lack of/limited family support, Insurance for SNF coverage, Medication compliance, and Behavior  Possible Resolutions to Barriers: Continue current medications, provide emotional support.     Medical Summary Current Status: still slow to process. memory/dementia inhibiting. stimulant trial  Barriers to Discharge: Medical stability   Possible Resolutions to Barriers/Weekly Focus: supervision at next level of care, stimulant trial as above   Continued Need for Acute Rehabilitation Level of Care: The  patient requires daily medical management by a physician with specialized training in physical medicine and rehabilitation for the following reasons: Direction of a multidisciplinary physical rehabilitation program to maximize functional independence : Yes Medical management of patient stability  for increased activity during participation in an intensive rehabilitation regime.: Yes Analysis of laboratory values and/or radiology reports with any subsequent need for medication adjustment and/or medical intervention. : Yes   I attest that I was present, lead the team conference, and concur with the assessment and plan of the team.   Cristi Loron 04/30/2021, 1:24 PM

## 2021-04-30 NOTE — Progress Notes (Signed)
Physical Therapy TBI Note  Patient Details  Name: Michelle [name] MRN: [id number] Date of Birth: 01/21/1946  Today's Date: 04/30/2021 PT Individual Time: 0800-0845 PT Individual Time Calculation (min): 45 min   Short Term Goals: Week 3:  PT Short Term Goal 1 (Week 3): Patient will perform basic transfers with min A >75% of the time. PT Short Term Goal 2 (Week 3): Patient will ambulate 50 ft using LRAD with mod A. PT Short Term Goal 3 (Week 3): Patient will perform bed mobility with min A >75% of the time.  Skilled Therapeutic Interventions/Progress Updates:     Patient in bed upon PT arrival. Patient alert and participated in PT session. Patient denied pain during session using yes/no questioning. Patient more engaged and alert throughout session, following simple commands >50% of the time.   Therapeutic Activity: Bed Mobility: Patient performed supine to sit with min A and increased time for initiation in a flat bed without use of bed rails. Provided mod verbal and tactile cues for initiation and sequencing. Cued patient to don paper scrub pants while sitting EOB. Patient took the pants and folded them. Provided further cues and visual gestures with patient attempting to follow by placing pants over her legs. Patient then asked "help" and PT assisted with threading lower extremities with mod A. Patient was able to pull up her pants over her hips in standing with min A for standing balance.  Transfers: Patient performed sit to/from stand x4 with min A with increased time for initiation and B facilitation under patient's axilla with support to her thoracic spine for forward flexion and trunk elongation. Patient performed sit to/from stand at the sink with demonstration for grasping the sink to pull up with min A. Cued patient to brush her teeth at the sink with only a toothbrush, tooth paste, and cup on the sink. Patient unable to initiate task. PT initiated task using backwards chaining and  handed a prepared toothbrush to the patient. Patient attempted to take the toothbrush, but spontaneously sat. Attempted to have patient brush her teeth in sitting, patient internally and externally distracted and demonstrating signs of fatigue and unable to complete task.  Gait Training:  Patient ambulated 45 feet x2 using B HHA with hands on therapist's shoulders with min progressing to mod A with fatigue and crouched gait. Ambulated with shuffling gait with reduced B weight shift and forward flexed posture. Provided mod multimodal cues for increased weight shift with forward propulsion and erect posture.  Patient in recliner with B lower extremities elevated at end of session with breaks locked, seat belt alarm set, and all needs within reach.   Therapy Documentation Precautions:  Precautions Precautions: Fall Precaution Comments: HOH Restrictions Weight Bearing Restrictions: No  Agitated Behavior Scale: TBI Observation Details Observation Environment: [place] of observation period - Date: 04/30/21 Start of observation period - Time: 0800 End of observation period - Date: 04/30/21 End of observation period - Time: 0845 Agitated Behavior Scale (DO NOT LEAVE BLANKS) Short attention span, easy distractibility, inability to concentrate: Present to a moderate degree Impulsive, impatient, low tolerance for pain or frustration: Absent Uncooperative, resistant to care, demanding: Absent Violent and/or threatening violence toward people or property: Absent Explosive and/or unpredictable anger: Absent Rocking, rubbing, moaning, or other self-stimulating behavior: Absent Pulling at tubes, restraints, etc.: Absent Wandering from treatment areas: Absent Restlessness, pacing, excessive movement: Absent Repetitive behaviors, motor, and/or verbal: Absent Rapid, loud, or excessive talking: Absent Sudden changes of mood: Absent Easily initiated  or excessive crying and/or laughter:  Absent Self-abusiveness, physical and/or verbal: Absent Agitated behavior scale total score: 16     Therapy/Group: Individual Therapy  Fritzie Prioleau L Kenli Waldo PT, DPT  04/30/2021, 4:43 PM

## 2021-04-30 NOTE — Progress Notes (Signed)
Occupational Therapy Session Note  Patient Details  Name: Michelle [name] MRN: [id number] Date of Birth: 12-Jan-1946  Today's Date: 04/30/2021 OT Individual Time: 1130-1200 OT Individual Time Calculation (min): 30 min    Short Term Goals: Week 2:  OT Short Term Goal 1 (Week 2): Pt will complete 1 familiar ADL task with no more than mod cueing for initiation OT Short Term Goal 1 - Progress (Week 2): Met OT Short Term Goal 2 (Week 2): Pt will complete UB dressing with mod A consistently for 3 sessions OT Short Term Goal 2 - Progress (Week 2): Met OT Short Term Goal 3 (Week 2): Pt will initiate 50% of commands during sessions OT Short Term Goal 3 - Progress (Week 2): Progressing toward goal  Skilled Therapeutic Interventions/Progress Updates:    Patient seated in recliner, husband present for session.  Patient alert and cooperative, occ smiles and appropriate verbal responses.  Max A for socks/shoes, max A for sit to stand, she is able to maintain standing with CG/min A, max A for ambulation with RW to toilet, incontinent of bowel and bladder requiring dependent change of pants, incontinence brief and hygiene.  Min a to initiate washing hands.  Mod A SPT to recliner where she remained at close of session, seat belt alarm set.    Therapy Documentation Precautions:  Precautions Precautions: Fall Precaution Comments: HOH Restrictions Weight Bearing Restrictions: No   Therapy/Group: Individual Therapy  Carlos Levering 04/30/2021, 7:33 AM

## 2021-04-30 NOTE — Progress Notes (Signed)
Speech Language Pathology TBI Note  Patient Details  Name: Michelle [name] MRN: [id number] Date of Birth: 1946/03/02  Today's Date: 04/30/2021 SLP Individual Time: 0700-0730 SLP Individual Time Calculation (min): 30 min  Short Term Goals: Week 3: SLP Short Term Goal 1 (Week 3): STGS=LTGs due to ELOS  Skilled Therapeutic Interventions: Skilled treatment session focused on dysphagia and cognitive goals. Patient easily awakened and repositioned to maximize safety with PO intake. Patient appeared more socially engaged today but continues to only respond to yes/no questions in 70% of opportunities and will shrug her shoulders when asked open ended questions. Patient self-fed small bites of yogurt with use of bilateral extremities for ~15 minutes but required Mod verbal cues for initiation. SLP eventually assisted with self-feeding to maximize PO intake. No overt s/s of aspiration noted. Recommend patient continue current diet. Patient left upright in bed with alarm on and all needs within reach. Continue with current plan of care.      Pain No/Denies Pain  Agitated Behavior Scale: TBI Observation Details Observation Environment: Patient's room Start of observation period - Date: 04/30/21 Start of observation period - Time: 0700 End of observation period - Date: 04/30/21 End of observation period - Time: 0730 Agitated Behavior Scale (DO NOT LEAVE BLANKS) Short attention span, easy distractibility, inability to concentrate: Present to a moderate degree Impulsive, impatient, low tolerance for pain or frustration: Absent Uncooperative, resistant to care, demanding: Absent Violent and/or threatening violence toward people or property: Absent Explosive and/or unpredictable anger: Absent Rocking, rubbing, moaning, or other self-stimulating behavior: Absent Pulling at tubes, restraints, etc.: Absent Wandering from treatment areas: Absent Restlessness, pacing, excessive movement:  Absent Repetitive behaviors, motor, and/or verbal: Absent Rapid, loud, or excessive talking: Absent Sudden changes of mood: Absent Easily initiated or excessive crying and/or laughter: Absent Self-abusiveness, physical and/or verbal: Absent Agitated behavior scale total score: 16  Therapy/Group: Individual Therapy  [name] 04/30/2021, 9:57 AM

## 2021-04-30 NOTE — Progress Notes (Signed)
Chronic Care Management Pharmacy Assistant   Name: Michelle [name]  MRN: [id number] DOB: 01-01-1946   Reason for Encounter: Disease State Hypertension     Recent office visits:  None noted.   Recent consult visits:  02/25/21 Michelle Newer MD - Neurology - Follow up for Alzheimer's - No medication changes - Follow up in 6 months   Hospital visits:  Medication Reconciliation was completed by comparing discharge summary, patient's EMR and Pharmacy list, and upon discussion with patient.  Admitted to the hospital on 04/08/21 due to Intracranial hemorrhage. Discharge date was 04/12/21. Discharged from [place]?Medications Started at [facility] Discharge:?? Keflex 1 capsule 500 mg 3 times daily for 4 days.   Medication Changes at Hospital Discharge: N/a  Medications Discontinued at Hospital Discharge: -Stopped Discontinue aspirin and nitrofurantoin 50 MG capsule  Medications that remain the same after Hospital Discharge:??  -All other medications will remain the same.    Medications: Facility-Administered Encounter Medications as of 04/30/2021  Medication   acetaminophen (TYLENOL) tablet 325-650 mg   alum & mag hydroxide-simeth (MAALOX/MYLANTA) 200-200-20 MG/5ML suspension 30 mL   amantadine (SYMMETREL) capsule 100 mg   bisacodyl (DULCOLAX) suppository 10 mg   diphenhydrAMINE (BENADRYL) 12.5 MG/5ML elixir 12.5-25 mg   donepezil (ARICEPT) tablet 10 mg   fluticasone (FLONASE) 50 MCG/ACT nasal spray 2 spray   gabapentin (NEURONTIN) capsule 300 mg   guaiFENesin-dextromethorphan (ROBITUSSIN DM) 100-10 MG/5ML syrup 5-10 mL   levothyroxine (SYNTHROID) tablet 50 mcg   loratadine (CLARITIN) tablet 10 mg   memantine (NAMENDA) tablet 10 mg   midodrine (PROAMATINE) tablet 5 mg   polyethylene glycol (MIRALAX / GLYCOLAX) packet 17 g   prochlorperazine (COMPAZINE) tablet 5-10 mg   Or   prochlorperazine (COMPAZINE) injection 5-10 mg   Or   prochlorperazine  (COMPAZINE) suppository 12.5 mg   senna-docusate (Senokot-S) tablet 2 tablet   sertraline (ZOLOFT) tablet 100 mg   sodium phosphate (FLEET) 7-19 GM/118ML enema 1 enema   traZODone (DESYREL) tablet 25-50 mg   Outpatient Encounter Medications as of 04/30/2021  Medication Sig Note   AMBULATORY NON FORMULARY MEDICATION Take 1 tablet by mouth daily. IBgard: Takes prn    CALCIUM PO Take 1 tablet by mouth 2 (two) times daily.     Cranberry 1000 MG CAPS Take 1,000 mg by mouth daily.    donepezil (ARICEPT) 10 MG tablet Take 1 tablet daily 04/08/2021: 05/17, 90 day supply    fluticasone (FLONASE) 50 MCG/ACT nasal spray Place 2 sprays into both nostrils daily.    gabapentin (NEURONTIN) 300 MG capsule Take 1 cap twice a day 04/08/2021: [facility] 06/10, 90 days supply    Lactobacillus-Inulin (CULTURELLE DIGESTIVE DAILY PO) Take 1 tablet by mouth daily.     levothyroxine (SYNTHROID) 50 MCG tablet TAKE 1 TABLET BY MOUTH EVERY DAY BEFORE BREAKFAST 04/08/2021: [facility] 06/23, 30 day supply    loratadine (CLARITIN) 10 MG tablet TAKE 1 TABLET BY MOUTH EVERY DAY 04/08/2021: [facility] 04/01, 90 day supply    memantine (NAMENDA) 10 MG tablet Take 1 tablet (10 mg total) by mouth 2 (two) times daily. 04/08/2021: [facility] 07/28, 90 day supply    midodrine (PROAMATINE) 5 MG tablet TAKE 1 TABLET (5 MG TOTAL) BY MOUTH 3 (THREE) TIMES DAILY WITH MEALS.    Multiple Vitamin (MULTIVITAMIN) tablet Take 1 tablet by mouth daily.    sertraline (ZOLOFT) 100 MG tablet Take 1 tablet (100 mg total) by mouth daily. 04/08/2021: [facility] 06/28, 90 day  supply    VITAMIN D PO Take 1,000 Units by mouth daily.    Reviewed chart prior to disease state call. Spoke with patient regarding BP  Recent Office Vitals: BP Readings from Last 3 Encounters:  05/03/21 (!) 105/50  04/12/21 (!) 113/51  02/25/21 118/75   Pulse Readings from Last 3 Encounters:  05/03/21 (!) 55  04/12/21 (!) 52  02/25/21 88    Wt Readings from Last 3 Encounters:  04/19/21 135 lb 9.3 oz (61.5 kg)   04/10/21 135 lb 5.8 oz (61.4 kg)  02/25/21 134 lb (60.8 kg)     Kidney Function Lab Results  Component Value Date/Time   CREATININE 0.81 04/29/2021 06:11 AM   CREATININE 0.79 04/22/2021 06:52 AM   CREATININE 0.71 03/21/2016 03:38 PM   GFR 50.67 (L) 02/16/2020 03:19 PM   GFRNONAA >60 04/29/2021 06:11 AM   GFRAA >60 02/09/2020 04:30 AM    BMP Latest Ref Rng & Units 04/29/2021 04/22/2021 04/09/2021  Glucose 70 - 99 mg/dL 92 99 116(H)  BUN 8 - 23 mg/dL '16 18 16  '$ Creatinine 0.44 - 1.00 mg/dL 0.81 0.79 0.95  Sodium 135 - 145 mmol/L 139 136 143  Potassium 3.5 - 5.1 mmol/L 3.7 3.7 4.2  Chloride 98 - 111 mmol/L 105 104 110  CO2 22 - 32 mmol/L '30 26 27  '$ Calcium 8.9 - 10.3 mg/dL 9.1 9.1 8.8(L)    Mis comment: Attempted to call patient and spoke with patient's Husband, Michelle [name].  Mr. Lujan states that Michelle [name] is currently at [facility] in [place], [place].   Star Rating Drugs: Midodrine 5 mg last fill 03/03/21 90 DS  Levothyroxine 50 Mcg last fill 02/28/21 30 DS     Andee Poles, CMA

## 2021-05-01 ENCOUNTER — Encounter (HOSPITAL_COMMUNITY): Payer: Self-pay | Admitting: Physical Medicine & Rehabilitation

## 2021-05-01 MED ORDER — AMANTADINE HCL 100 MG PO CAPS: 100.0000 mg | ORAL_CAPSULE | Freq: Two times a day (BID) | ORAL | Status: DC

## 2021-05-01 MED ORDER — ACETAMINOPHEN 325 MG PO TABS: 325.0000 mg | ORAL_TABLET | ORAL | Status: AC | PRN

## 2021-05-01 MED ORDER — SENNOSIDES-DOCUSATE SODIUM 8.6-50 MG PO TABS: 2.0000 | ORAL_TABLET | Freq: Every day | ORAL | Status: DC

## 2021-05-01 MED ORDER — POLYETHYLENE GLYCOL 3350 17 G PO PACK: 17.0000 g | PACK | Freq: Every day | ORAL | 0 refills | Status: AC | PRN

## 2021-05-01 NOTE — Progress Notes (Signed)
Occupational Therapy TBI Note  Patient Details  Name: Michelle [name] MRN: [id number] Date of Birth: [birth year]  Today's Date: 05/01/2021 OT Individual Time: 0277-4128 OT Individual Time Calculation (min): 55 min    Short Term Goals: Week 3:  OT Short Term Goal 1 (Week 3): STG=LTG d/t ELOS  Skilled Therapeutic Interventions/Progress Updates:    Pt received supine with no indications of pain. One hearing aid located (L) and it was placed into her L ear. Pt required max facilitation to initiate bed mobility to EOB and max A overall. Pt again required max facilitation and assist to come into standing but was then able to pivot with only min A. She required mod A for stand pivot transfer to the toilet. Continent void of small BM, no urine but incontinence brief was saturated with urine. Pt was unable to initiate LB dressing but she did initiate motor plan correctly when OT started dressing tasks. Pt stood from the toilet with min A and assisted OT in pulling up depends and pants. Pt was taken to the dayroom for change of scenery and to eat outside of the room. Pt required mod facilitation to initiate self feeding and then was able to carryover eating with only min cueing every 3-5 min. Pt was taken back to her room and left sitting up in the recliner with all needs met, chair alarm belt fastened.   Therapy Documentation Precautions:  Precautions Precautions: Fall Precaution Comments: HOH Restrictions Weight Bearing Restrictions: No  Agitated Behavior Scale: TBI Observation Details Observation Environment: [facility] Start of observation period - Date: 05/01/21 Start of observation period - Time: 0835 End of observation period - Date: 05/01/21 End of observation period - Time: 0930 Agitated Behavior Scale (DO NOT LEAVE BLANKS) Short attention span, easy distractibility, inability to concentrate: Present to a moderate degree Impulsive, impatient, low tolerance for pain or frustration:  Absent Uncooperative, resistant to care, demanding: Absent Violent and/or threatening violence toward people or property: Absent Explosive and/or unpredictable anger: Absent Rocking, rubbing, moaning, or other self-stimulating behavior: Absent Pulling at tubes, restraints, etc.: Absent Wandering from treatment areas: Absent Restlessness, pacing, excessive movement: Absent Repetitive behaviors, motor, and/or verbal: Absent Rapid, loud, or excessive talking: Absent Sudden changes of mood: Absent Easily initiated or excessive crying and/or laughter: Absent Self-abusiveness, physical and/or verbal: Absent Agitated behavior scale total score: 16    Therapy/Group: Individual Therapy  Curtis Sites 05/01/2021, 10:06 AM

## 2021-05-01 NOTE — Progress Notes (Signed)
Physical Therapy TBI Note  Patient Details  Name: Michelle [name] MRN: [id number] Date of Birth: Feb 02, 1946  Today's Date: 05/01/2021 PT Individual Time: 1500-1540 PT Individual Time Calculation (min): 40 min   Short Term Goals: Week 3:  PT Short Term Goal 1 (Week 3): Patient will perform basic transfers with min A >75% of the time. PT Short Term Goal 2 (Week 3): Patient will ambulate 50 ft using LRAD with mod A. PT Short Term Goal 3 (Week 3): Patient will perform bed mobility with min A >75% of the time.  Skilled Therapeutic Interventions/Progress Updates:     Patient in bed asleep upon PT return to attempt to make up missed session. Patient aroused to verbal and tactile stimulation and agreeable to PT session. Patient denied pain during session.  Threaded pants bed level and patient performed supine to sit with mod A and significant increased time and cues to initiate. Required mod-max A for sitting balance and progressed to min A with mod multimodal cues for reduced retropulsion. Patient stood with mod-min A holding therapist's shoulders and pulled up pants in standing with min A for clothing management and min A for balance with single upper extremity support. Performed lateral sway for weight shifts then began pivot to sit in the w/c. Donned B tennis shoes with total A with patient in sitting. Patient fell asleep while PT donned shoes and slid forward in the chair in sacral sitting. Required increased time to arouse and respond there therapist's cues to get back to bed due to fatigue. Patient requested to get in the recliner, pointing at the recliner stating, "can I get in that." Required increased time to initiate stand step transfer, ambulating 3 feet to recliner with min-mod A and B upper extremities on therapist's shoulders.   Patient in recliner with B LEs elevated, bed and pillows placed on R side to prevent lateral lean and NT in the room at end of session with breaks locked, seat belt  alarm set, and all needs within reach.   Therapy Documentation Precautions:  Precautions Precautions: Fall Precaution Comments: HOH Restrictions Weight Bearing Restrictions: No Agitated Behavior Scale: TBI Observation Details Observation Environment: pt room Start of observation period - Date: 05/01/21 Start of observation period - Time: 1500 End of observation period - Date: 05/01/21 End of observation period - Time: 1540 Agitated Behavior Scale (DO NOT LEAVE BLANKS) Short attention span, easy distractibility, inability to concentrate: Present to a moderate degree Impulsive, impatient, low tolerance for pain or frustration: Absent Uncooperative, resistant to care, demanding: Absent Violent and/or threatening violence toward people or property: Absent Explosive and/or unpredictable anger: Absent Rocking, rubbing, moaning, or other self-stimulating behavior: Absent Pulling at tubes, restraints, etc.: Absent Wandering from treatment areas: Absent Restlessness, pacing, excessive movement: Absent Repetitive behaviors, motor, and/or verbal: Absent Rapid, loud, or excessive talking: Absent Sudden changes of mood: Absent Easily initiated or excessive crying and/or laughter: Absent Self-abusiveness, physical and/or verbal: Absent Agitated behavior scale total score: 16     Therapy/Group: Individual Therapy  Becky Berberian L Laurelyn Terrero PT, DPT  05/01/2021, 4:41 PM

## 2021-05-01 NOTE — Progress Notes (Signed)
Physical Therapy Note  Patient Details  Name: NIAYA KRIMMEL MRN: [id number] Date of Birth: Nov 08, 1945 Today's Date: 05/01/2021    Patient in bed asleep, initially did not arouse to verbal stimulation, but did arouse to verbal and tactile stimulation. Patient with difficulty keeping her eyes open and nodded yes to yes/no questions of feeling tired and if she wanted PT to return at a later time. Patient missed 45 min of skilled PT due to fatigue, RN made aware. Will attempt to make-up missed time as able.      Nazaiah Navarrete L Ledell Codrington PT, DPT  05/01/2021, 2:14 PM

## 2021-05-01 NOTE — Progress Notes (Signed)
Speech Language Pathology TBI Note  Patient Details  Name: Michelle [name] MRN: [id number] Date of Birth: 17-Sep-1945  Today's Date: 05/01/2021 SLP Individual Time: 1030-1100 SLP Individual Time Calculation (min): 30 min  Short Term Goals: Week 3: SLP Short Term Goal 1 (Week 3): STGS=LTGs due to ELOS  Skilled Therapeutic Interventions: Pt seen for skilled ST with focus on cognitive communication goals, pt sleeping upright in recliner when SLP entered, easily roused for tx but benefiting from verbal cues throughout to maintain attention/alertness. SLP facilitating simple problem solving and sequencing for functional tasks (washing hands and washing face) by providing max fading to mod cues for initiation and completion of various steps. Pt responding to simple yes/no questions on 70% of opportunities this date provided extra time and repetition. Pt left in recliner with alarm activated and all needs within reach. Cont ST POC.    Pain Pain Assessment Pain Scale: 0-10 Pain Score: 0-No pain  Agitated Behavior Scale: TBI Observation Details Observation Environment: pt room Start of observation period - Date: 05/01/21 Start of observation period - Time: 1030 End of observation period - Date: 05/01/21 End of observation period - Time: 1100 Agitated Behavior Scale (DO NOT LEAVE BLANKS) Short attention span, easy distractibility, inability to concentrate: Present to a moderate degree Impulsive, impatient, low tolerance for pain or frustration: Absent Uncooperative, resistant to care, demanding: Absent Violent and/or threatening violence toward people or property: Absent Explosive and/or unpredictable anger: Absent Rocking, rubbing, moaning, or other self-stimulating behavior: Absent Pulling at tubes, restraints, etc.: Absent Wandering from treatment areas: Absent Restlessness, pacing, excessive movement: Absent Repetitive behaviors, motor, and/or verbal: Absent Rapid, loud, or excessive  talking: Absent Sudden changes of mood: Absent Easily initiated or excessive crying and/or laughter: Absent Self-abusiveness, physical and/or verbal: Absent Agitated behavior scale total score: 16  Therapy/Group: Individual Therapy  Dewaine Conger 05/01/2021, 10:42 AM

## 2021-05-02 DIAGNOSIS — R1013 Epigastric pain: Secondary | ICD-10-CM

## 2021-05-02 DIAGNOSIS — K2289 Other specified disease of esophagus: Secondary | ICD-10-CM

## 2021-05-02 LAB — SARS CORONAVIRUS 2 (TAT 6-24 HRS): SARS Coronavirus 2: NEGATIVE

## 2021-05-02 NOTE — Progress Notes (Signed)
Inpatient Rehabilitation Care Coordinator Discharge Note   Patient Details  Name: Michelle [name] MRN: [id number] Date of Birth: 05-02-1946   Discharge location: D/c to SNF- [facility]. Rm#[id number]; Nurse Report [phone]  Length of Stay: 20 days  Discharge activity level: Mod A to Max A  Home/community participation: Limited  Patient response [id number] Literacy - How often do you need to have someone help you when you read instructions, pamphlets, or other written material from your doctor or pharmacy?: Patient unable to respond (dementia)  Patient response [id number] Isolation - How often do you feel lonely or isolated from those around you?: Patient unable to respond  Services provided included: MD, RD, PT, OT, RN, SLP, CM, TR, Pharmacy, Neuropsych, SW  Financial Services:  Charity fundraiser Utilized: [employer]  Choices offered to/list presented to: Yes  Follow-up services arranged:   (N/A)   Patient response to transportation need: Is the patient able to respond to transportation needs?: Yes In the past 12 months, has lack of transportation kept you from medical appointments or from getting medications?: No In the past 12 months, has lack of transportation kept you from meetings, work, or from getting things needed for daily living?: No  Comments (or additional information):  Patient/Family verbalized understanding of follow-up arrangements:  Other (Comment) (Pt husband mananges all care needs due to pt having dementia)  Individual responsible for coordination of the follow-up plan:    Confirmed correct DME delivered: Rana Snare 05/02/2021    Rana Snare

## 2021-05-02 NOTE — Progress Notes (Signed)
Patient ID: Michelle [name], female   DOB: May 12, 1946, 75 y.o.   MRN: [id number]  SW received updates from [place] reporting appeal was overturned in pt favor and pt approved for SNF placement to [facility]. [facility]auf[facility]#[id number]; approved for 7 days with start date being 8/23 and end date 8/29. CM- Michelle [name] [id number]).  SW called pt husband Michelle [name] to inform on above. He will f/u with SW after his Afib appointment since he will be at he hospital later.  SW left message for Michelle [name] (D.O.N.) [phone]) to discuss bed availability and admission and waiting on follow-up.  *SW met with pt husband in room to discuss transition, and stated will work on d/c plan once informed on if there are beds available. SW  spoke with Michelle [name]/Admissions team with Michelle [name] who confirmed bed available, and cam transition. Will require new COVID test. Pt will go to Rm #36; Nurse Report [phone]. PTAR [id number]) scheduled p/u 8/26 at 10am. SW informed pt husband Michelle [name] and medical team on pt planned d/c for tomorrow.  Michelle [name], MSW, [place] Office: [phone] Cell: [phone] Fax: [phone]

## 2021-05-02 NOTE — Progress Notes (Signed)
PROGRESS NOTE   Subjective/Complaints: Up in bed. Just awakening. No pain or problems.   ROS: Limited due to cognitive/behavioral   Objective:   No results found. No results for input(s): WBC, HGB, HCT, PLT in the last 72 hours.   No results for input(s): NA, K, CL, CO2, GLUCOSE, BUN, CREATININE, CALCIUM in the last 72 hours.    Intake/Output Summary (Last 24 hours) at 05/02/2021 1036 Last data filed at 05/02/2021 0733 Gross per 24 hour  Intake 356 ml  Output 0 ml  Net 356 ml         Physical Exam: Vital Signs Blood pressure (!) 124/54, pulse (!) 59, temperature 98.6 F (37 C), temperature source Oral, resp. rate 18, height '5\' 3"'$  (1.6 m), weight 61.5 kg, SpO2 99 %.  Constitutional: No distress . Vital signs reviewed. HEENT: NCAT, EOMI, oral membranes moist Neck: supple Cardiovascular: RRR without murmur. No JVD    Respiratory/Chest: CTA Bilaterally without wheezes or rales. Normal effort    GI/Abdomen: BS +, non-tender, non-distended Ext: no clubbing, cyanosis, or edema Psych: flat, cooperates with cueing.  Skin: No evidence of breakdown, no evidence of rash Musc: No edema in extremities.  No tenderness in extremities. Neuro: Alert. Makes eye contact. Initiating a little more Motor: moves all 4's. Senses pain.  HOH, communicates on limited basis with hearing aides in. Limited insight and awareness.   Assessment/Plan: 1. Functional deficits which require 3+ hours per day of interdisciplinary therapy in a comprehensive inpatient rehab setting. Physiatrist is providing close team supervision and 24 hour management of active medical problems listed below. Physiatrist and rehab team continue to assess barriers to discharge/monitor patient progress toward functional and medical goals  Care Tool:  Bathing    Body parts bathed by patient: Face, Front perineal area, Right upper leg, Left upper leg   Body parts  bathed by helper: Right arm, Left arm, Chest, Abdomen, Buttocks, Right lower leg, Left lower leg     Bathing assist Assist Level: Maximal Assistance - Patient 24 - 49%     Upper Body Dressing/Undressing Upper body dressing   What is the patient wearing?: Pull over shirt    Upper body assist Assist Level: Maximal Assistance - Patient 25 - 49%    Lower Body Dressing/Undressing Lower body dressing      What is the patient wearing?: Underwear/pull up, Pants     Lower body assist Assist for lower body dressing: Maximal Assistance - Patient 25 - 49%     Toileting Toileting    Toileting assist Assist for toileting: Maximal Assistance - Patient 25 - 49%     Transfers Chair/bed transfer  Transfers assist     Chair/bed transfer assist level: Moderate Assistance - Patient 50 - 74% Chair/bed transfer assistive device: Museum/gallery exhibitions officer assist      Assist level: Maximal Assistance - Patient 25 - 49% Assistive device: Hand held assist Max distance: 13   Walk 10 feet activity   Assist     Assist level: Moderate Assistance - Patient - 50 - 74% Assistive device: Hand held assist   Walk 50 feet activity   Assist  Assist level: Moderate Assistance - Patient - 50 - 74% Assistive device: Walker-rolling    Walk 150 feet activity   Assist Walk 150 feet activity did not occur: Safety/medical concerns         Walk 10 feet on uneven surface  activity   Assist Walk 10 feet on uneven surfaces activity did not occur: Safety/medical concerns         Wheelchair     Assist Is the patient using a wheelchair?: No             Wheelchair 50 feet with 2 turns activity    Assist            Wheelchair 150 feet activity     Assist          Blood pressure (!) 124/54, pulse (!) 59, temperature 98.6 F (37 C), temperature source Oral, resp. rate 18, height '5\' 3"'$  (1.6 m), weight 61.5 kg, SpO2 99 %.  Medical  Problem List and Plan: 1.  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   -Continue CIR therapies including PT, OT, and SLP ---SNF appeal granted. Await bed  -therapy QD. Trial of amantadine for attention with some benefit initially 2.  Antithrombotics: -DVT/anticoagulation:  Mechanical: Sequential compression devices, below knee Bilateral lower extremities             -antiplatelet therapy: N/A 3. Pain Management: Tylenol prn  -gabapentin for chronic headache  Appears to be controlled on 8/25 4. Mood: LCSW to follow for evaluation and support.              -antipsychotic agents: N/a 5. Neuropsych: This patient is not capable of making decisions on her own behalf. 6. Skin/Wound Care: Routine pressure relief measures.  7. Fluids/Electrolytes/Nutrition: Monitor I/Os.             -PO intake reasonable -continue to encourage  Recent labs ok 8. Kleb pneumo UTI: S to keflex--continue x 4 days total ( in addition to 3d rocephin), completed 8/10 9. Alzheimer's dementia:Continue Namenda and aricept             --Zoloft for mood stabilization             -continue sleep chart 10 Hypothyroid: On supplement. 11. Acute blood loss anemia:   Hemoglobin 11.5 on 8/22 12. Orthostatic Hypotension: Encourage fluid intake. Continue ProAmatine 5 mg tid.   Vitals:   05/01/21 1917 05/02/21 0521  BP: (!) 112/53 (!) 124/54  Pulse: 64 (!) 59  Resp: 16 18  Temp: 98.6 F (37 C)   SpO2: 100% 99%   8/25 bp stable. Continue midodrine as above  LOS: 20 days A FACE TO [place] 05/02/2021, 10:36 AM

## 2021-05-02 NOTE — Progress Notes (Signed)
Physical Therapy Note  Patient Details  Name: Michelle [name] MRN: [id number] Date of Birth: 1946-04-19 Today's Date: 05/02/2021    Patient asleep in the recliner upon PT arrival for 1430 PT session. Patient aroused to verbal and tactile stimulation. Patient shook her head no to getting out of the chair and closed her eyes x2. Patient missed 30 min of skilled PT due to fatigue/lethargy, RN made aware. Will attempt to make-up missed time as able.     Ashleynicole Mcclees L Hollee Fate PT, DPT  05/02/2021, 5:02 PM

## 2021-05-02 NOTE — Progress Notes (Signed)
Occupational Therapy TBI Note  Patient Details  Name: Michelle [name] MRN: [id number] Date of Birth: September 30, 1945  Today's Date: 05/02/2021 OT Individual Time: 7893-8101 OT Individual Time Calculation (min): 40 min    Short Term Goals: Week 1:  OT Short Term Goal 1 (Week 1): Patient will attend to grooming tasks and complete with min cues and CGA in stance OT Short Term Goal 1 - Progress (Week 1): Not met OT Short Term Goal 2 (Week 1): patient will complete upper body bathing and dressing with min A OT Short Term Goal 2 - Progress (Week 1): Not met OT Short Term Goal 3 (Week 1): patient will complete toileting, lower body bathing and dressing with mod A OT Short Term Goal 3 - Progress (Week 1): Not met OT Short Term Goal 4 (Week 1): patient will complete sit to stand and SPT with LRAD min a OT Short Term Goal 4 - Progress (Week 1): Not met  Skilled Therapeutic Interventions/Progress Updates:     Pt received in bed with hands on face. No indication of pain.  ADL: Pt completes UB dressing with MAX A for doffing, MOD A for donning pull over shirt with hand under hand A for initiation throughout session Pt completes LB dressing with MAX A for threading first leg, A to initiate threading second LE, and A to pull past hips. Pt able to fasten pants Pt completes footwear with total A Pt completes toileting transfer with Min A for initiation and poor body mechanics with grab bar use to step  Pt left at end of session in recliner with exit alarm on, call light in reach and all needs met   Therapy Documentation Precautions:  Precautions Precautions: Fall Precaution Comments: HOH Restrictions Weight Bearing Restrictions: No General:   Vital Signs:  Pain: Pain Assessment Pain Scale: Faces Pain Score: 0-No pain Agitated Behavior Scale: TBI  Observation Details Observation Environment: pt room Start of observation period - Date: 05/02/21 Start of observation period - Time: 0915 End  of observation period - Date: 05/02/21 End of observation period - Time: 1000 Agitated Behavior Scale (DO NOT LEAVE BLANKS) Short attention span, easy distractibility, inability to concentrate: Present to a moderate degree Impulsive, impatient, low tolerance for pain or frustration: Absent Uncooperative, resistant to care, demanding: Absent Violent and/or threatening violence toward people or property: Absent Explosive and/or unpredictable anger: Absent Rocking, rubbing, moaning, or other self-stimulating behavior: Absent Pulling at tubes, restraints, etc.: Absent Wandering from treatment areas: Absent Restlessness, pacing, excessive movement: Absent Repetitive behaviors, motor, and/or verbal: Absent Rapid, loud, or excessive talking: Absent Sudden changes of mood: Absent Easily initiated or excessive crying and/or laughter: Absent Self-abusiveness, physical and/or verbal: Absent Agitated behavior scale total score: 16  ADL: ADL Grooming: Moderate assistance Where Assessed-Grooming: Wheelchair, Sitting at sink Upper Body Bathing: Maximal assistance Where Assessed-Upper Body Bathing: Sitting at sink, Wheelchair Lower Body Bathing: Dependent Where Assessed-Lower Body Bathing: Sitting at sink, Standing at sink, Wheelchair Upper Body Dressing: Maximal assistance Where Assessed-Upper Body Dressing: Wheelchair Lower Body Dressing: Maximal assistance Where Assessed-Lower Body Dressing: Wheelchair Toileting: Dependent Where Assessed-Toileting: Other (Comment) (incontinent of bladder in brief) Vision   Perception    Praxis   Exercises:   Other Treatments:     Therapy/Group: Individual Therapy  Tonny Branch 05/02/2021, 9:59 AM

## 2021-05-02 NOTE — Progress Notes (Signed)
Speech Language Pathology TBI Note  Patient Details  Name: Michelle [name] MRN: [id number] Date of Birth: 01-21-1946  Today's Date: 05/02/2021 SLP Individual Time: 0730-0800 SLP Individual Time Calculation (min): 30 min  Short Term Goals: Week 3: SLP Short Term Goal 1 (Week 3): STGS=LTGs due to ELOS  Skilled Therapeutic Interventions: Skilled treatment session focused on cognitive goals. Upon arrival, patient was sound asleep and required Max A multimodal cues and more than a reasonable amount of time to awaken. SLP facilitated session by providing hand over hand assist and Max A multimodal cues for focused attention and initiation with self-feeding.  Patient consumed limited amounts of breakfast this morning as she constantly was holding a sheet or a towel to her mouth and would not remove it upon request. Patient also minimally responsive today and was unable ot make a choice from a field of 2 and did not answer any yes/no questions. RN aware. Patient left upright in bed with alarm on and all needs within reach. Continue with current plan of care.      Pain Pain Assessment Pain Scale: Faces Pain Score: 0-No pain  Agitated Behavior Scale: TBI Observation Details Observation Environment: Patient's room Start of observation period - Date: 05/02/21 Start of observation period - Time: 0730 End of observation period - Date: 05/02/21 End of observation period - Time: 0800 Agitated Behavior Scale (DO NOT LEAVE BLANKS) Short attention span, easy distractibility, inability to concentrate: Present to a moderate degree Impulsive, impatient, low tolerance for pain or frustration: Absent Uncooperative, resistant to care, demanding: Absent Violent and/or threatening violence toward people or property: Absent Explosive and/or unpredictable anger: Absent Rocking, rubbing, moaning, or other self-stimulating behavior: Absent Pulling at tubes, restraints, etc.: Absent Wandering from treatment areas:  Absent Restlessness, pacing, excessive movement: Absent Repetitive behaviors, motor, and/or verbal: Absent Rapid, loud, or excessive talking: Absent Sudden changes of mood: Absent Easily initiated or excessive crying and/or laughter: Absent Self-abusiveness, physical and/or verbal: Absent Agitated behavior scale total score: 16  Therapy/Group: Individual Therapy  Michelle [name] 05/02/2021, 8:06 AM

## 2021-05-02 NOTE — Plan of Care (Signed)
  Problem: RH Balance Goal: LTG: Patient will maintain dynamic sitting balance (OT) Description: LTG:  Patient will maintain dynamic sitting balance with assistance during activities of daily living (OT) Outcome: Completed/Met Goal: LTG Patient will maintain dynamic standing with ADLs (OT) Description: LTG:  Patient will maintain dynamic standing balance with assist during activities of daily living (OT)  Outcome: Not Met (add Reason) Note: Pt inconsistently MOD A depending on environment/cognition. SMS OT   Problem: Sit to Stand Goal: LTG:  Patient will perform sit to stand in prep for activites of daily living with assistance level (OT) Description: LTG:  Patient will perform sit to stand in prep for activites of daily living with assistance level (OT) Outcome: Completed/Met   Problem: RH Eating Goal: LTG Patient will perform eating w/assist, cues/equip (OT) Description: LTG: Patient will perform eating with assist, with/without cues using equipment (OT) Outcome: Not Met (add Reason) Note: Pt inconsistently MOD A depending on environment/cognition. SMS OT   Problem: RH Grooming Goal: LTG Patient will perform grooming w/assist,cues/equip (OT) Description: LTG: Patient will perform grooming with assist, with/without cues using equipment (OT) Outcome: Completed/Met   Problem: RH Bathing Goal: LTG Patient will bathe all body parts with assist levels (OT) Description: LTG: Patient will bathe all body parts with assist levels (OT) Outcome: Completed/Met   Problem: RH Dressing Goal: LTG Patient will perform upper body dressing (OT) Description: LTG Patient will perform upper body dressing with assist, with/without cues (OT). Outcome: Not Met (add Reason) Note: Pt inconsistently MOD A depending on environment/cognition. SMS OT Goal: LTG Patient will perform lower body dressing w/assist (OT) Description: LTG: Patient will perform lower body dressing with assist, with/without cues in  positioning using equipment (OT) Outcome: Completed/Met   Problem: RH Toileting Goal: LTG Patient will perform toileting task (3/3 steps) with assistance level (OT) Description: LTG: Patient will perform toileting task (3/3 steps) with assistance level (OT)  Outcome: Completed/Met Note: Pt inconsistently MOD A depending on environment/cognition. SMS OT   Problem: RH Toilet Transfers Goal: LTG Patient will perform toilet transfers w/assist (OT) Description: LTG: Patient will perform toilet transfers with assist, with/without cues using equipment (OT) Outcome: Completed/Met   Problem: RH Tub/Shower Transfers Goal: LTG Patient will perform tub/shower transfers w/assist (OT) Description: LTG: Patient will perform tub/shower transfers with assist, with/without cues using equipment (OT) Outcome: Completed/Met   Problem: RH Attention Goal: LTG Patient will demonstrate this level of attention during functional activites (OT) Description: LTG:  Patient will demonstrate this level of attention during functional activites  (OT) Outcome: Not Met (add Reason) Note: Pt continues to demo significant sustained attention impairments requiring multimodal cuing SMS OT

## 2021-05-02 NOTE — Progress Notes (Signed)
Occupational Therapy Discharge Summary  Patient Details  Name: Michelle [name] MRN: [id number] Date of Birth: 12/23/1945  Patient has met 8 of 12 long term goals due to improved activity tolerance, improved balance, and ability to compensate for deficits.  Patient to discharge at overall min- Mod Assist level.  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 Pt presents with cognitive deficits more in line with AD diagnosis with biggest impacts in functional performance of BADLs and transfers with poor initiation, attention, and processing speed. Pt would benefit from continued support at a SNF memory care level to improve safety with BADLs/mobility.  Reasons goals not met: pt inconsistently performing STS, SPT, UB dressing and with continued poor attention therefore not meeting MIN A goals.   Recommendation:  Patient will benefit from ongoing skilled OT services in skilled nursing facility setting to continue to advance functional skills in the area of BADL and Reduce care partner burden.  Equipment: No equipment provided  Reasons for discharge: treatment goals met and discharge from hospital  Patient/family agrees with progress made and goals achieved: Yes  OT Discharge Precautions/Restrictions  Precautions Precautions: Fall Precaution Comments: HOH General   Vital Signs Therapy Vitals Temp: (!) 97.5 F (36.4 C) Temp Source: Oral Pulse Rate: 62 Resp: 16 BP: (!) 101/50 Patient Position (if appropriate): Sitting Oxygen Therapy SpO2: 96 % O2 Device: Room Air Pain Pain Assessment Pain Score: 0-No pain Vision Vision Assessment?: Yes Eye Alignment: Within Functional Limits Ocular Range of Motion: Within Functional Limits Alignment/Gaze Preference: Within Defined Limits Perception  Perception: Impaired Figure Ground: noticed impaired depth perception Praxis Praxis: Impaired Praxis Impairment Details: Motor  planning;Initiation Cognition Overall Cognitive Status: History of cognitive impairments - at baseline Arousal/Alertness: Awake/alert Attention: Focused;Sustained Sustained Attention: Impaired Memory Recall Sock: Not able to recall Memory Recall Blue: Not able to recall Memory Recall Bed: Not able to recall Awareness: Impaired Problem Solving: Impaired Safety/Judgment: Impaired Comments: Alzheimer's has primarily presented as short term memory loss (within the day or within a few minutes) Sensation Sensation Light Touch: Not tested Proprioception: Impaired by gross assessment Additional Comments: unable to formally test Coordination Gross Motor Movements are Fluid and Coordinated: No Fine Motor Movements are Fluid and Coordinated: No Coordination and Movement Description: GM movements are impaired due to impaired balance Finger Nose Finger Test: unable to follow directions for specific assessments, but able to manage adl tasks with adequate dexterity bilaterally Motor  Motor Motor: Abnormal postural alignment and control Motor - Skilled Clinical Observations: alternating anterior and posterior dramatic lean d/t poor proprioception and righting reactions Mobility  Bed Mobility Supine to Sit: Moderate Assistance - Patient 50-74% Sit to Supine: Moderate Assistance - Patient 50-74% Transfers Sit to Stand: Minimal Assistance - Patient > 75% Stand to Sit: Minimal Assistance - Patient > 75%  Trunk/Postural Assessment  Cervical Assessment Cervical Assessment:  (forward head) Thoracic Assessment Thoracic Assessment:  (rounded shoulders) Lumbar Assessment Lumbar Assessment:  (posterior pelvic tilt) Postural Control Postural Control: Deficits on evaluation  Balance Static Sitting Balance Static Sitting - Level of Assistance: 4: Min assist;3: Mod assist Dynamic Sitting Balance Dynamic Sitting - Level of Assistance: 4: Min assist;3: Mod assist Sitting balance - Comments: patient  leans posteriorly and keeps legs crossed with feet not touching the floor Static Standing Balance Static Standing - Level of Assistance: 4: Min assist Dynamic Standing Balance Dynamic Standing - Level of Assistance: 3: Mod assist Extremity/Trunk Assessment RUE Assessment RUE Assessment: Within Functional Limits LUE Assessment  LUE Assessment: Within Functional Limits   Tonny Branch 05/02/2021, 4:38 PM

## 2021-05-02 NOTE — Progress Notes (Signed)
Physical Therapy Discharge Summary  Patient Details  Name: Michelle [name] MRN: [id number] Date of Birth: 18-Aug-1946  Today's Date: 05/02/2021   Patient has met 5 of 9 long term goals due to improved activity tolerance, improved balance, improved postural control, increased strength, decreased pain, ability to compensate for deficits, improved attention, improved awareness, and improved coordination.  Patient to discharge at a wheelchair level [place].   Patient's care partner requires assistance and is unable  to provide the necessary physical and cognitive assistance at discharge. Plan for d/c to SNF for continued progress with functional mobility prior to d/c to next level of care.  Reasons goals not met: Patient with inconsistent performance requiring intermittent mod A for bed mobility, sitting balance, and gait due to decreased motor planning, decreased initiation, and fatigue. Limited progress throughout stay due to cognitive deficits and deficits in motor planning.   Recommendation:  Patient will benefit from ongoing skilled PT services in skilled nursing facility setting to continue to advance safe functional mobility, address ongoing impairments in initiation, motor planning, functional mobility, midline orientation, balance, activity tolerance, attention, arousal, and minimize fall risk.  Equipment: No equipment provided, equipment to be provided at the next level of care.  Reasons for discharge: discharge from hospital  Patient/family agrees with progress made and goals achieved: Yes  PT Discharge Precautions/Restrictions Precautions Precautions: Fall Precaution Comments: HOH, has L hearing aid Restrictions Weight Bearing Restrictions: No Pain Interference Pain Interference Pain Effect on Sleep: 8. Unable to answer Pain Interference with Therapy Activities: 8. Unable to answer Pain Interference with Day-to-Day Activities: 8. Unable to answer Vision/Perception  Vision  - History Ability to See in Adequate Light: 0 Adequate Vision - Assessment Eye Alignment: Within Functional Limits Ocular Range of Motion: Within Functional Limits Alignment/Gaze Preference: Within Defined Limits Tracking/Visual Pursuits: Other (comment) (able to track therapist L<>R and locate items with max cuing) Perception Perception: Impaired Figure Ground: noticed impaired depth perception Praxis Praxis: Impaired Praxis Impairment Details: Motor planning;Initiation  Cognition Overall Cognitive Status: History of cognitive impairments - at baseline Arousal/Alertness: Awake/alert Attention: Focused;Sustained Focused Attention: Impaired Focused Attention Impairment: Functional basic Sustained Attention: Impaired Sustained Attention Impairment: Functional basic Memory: Impaired Memory Impairment: Storage deficit;Retrieval deficit;Decreased short term memory;Decreased recall of new information Decreased Short Term Memory: Verbal basic;Functional basic Awareness: Impaired Problem Solving: Impaired Safety/Judgment: Impaired Comments: Alzheimer's has primarily presented as short term memory loss (within the day or within a few minutes) [employer] Scales of Cognitive Functioning: Other (comment) (unable to assess due to speech deficits and baseline hx of Alzheimer's) Sensation Sensation Light Touch: Not tested (unable to formally assess despite attempts as pt unable to follow directions) Proprioception: Impaired by gross assessment Coordination Gross Motor Movements are Fluid and Coordinated: No Fine Motor Movements are Fluid and Coordinated: No Coordination and Movement Description: GM movements are impaired due to impaired balance, midline orientation, and general B LE weakness Motor  Motor Motor: Abnormal postural alignment and control Motor - Discharge Observations: alternating anterior and posterior dramatic lean d/t poor proprioception and righting reactions   Mobility Bed Mobility Bed Mobility: Rolling Right;Rolling Left Rolling Right: Moderate Assistance - Patient 50-74% Rolling Left: Moderate Assistance - Patient 50-74% Supine to Sit: Moderate Assistance - Patient 50-74%;Minimal Assistance - Patient > 75% Sit to Supine: Minimal Assistance - Patient > 75% Transfers Sit to Stand: Minimal Assistance - Patient > 75% Stand to Sit: Minimal Assistance - Patient > 75% Stand Pivot Transfers: Minimal Assistance - Patient > 75% Stand  Pivot Transfer Details: Tactile cues for sequencing;Tactile cues for initiation;Verbal cues for technique;Tactile cues for weight shifting;Tactile cues for placement;Visual cues/gestures for sequencing;Verbal cues for sequencing Transfer (Assistive device): 1 person hand held assist Locomotion  Gait Ambulation: Yes Gait Assistance: Moderate Assistance - Patient 50-74%;Minimal Assistance - Patient > 75% Gait Distance (Feet): 50 Feet Assistive device: 1 person hand held assist (R HHA) Gait Assistance Details: Verbal cues for technique;Verbal cues for sequencing;Tactile cues for sequencing;Tactile cues for weight shifting;Tactile cues for posture;Tactile cues for weight beaing;Visual cues/gestures for sequencing;Verbal cues for gait pattern;Manual facilitation for weight shifting Gait Gait: Yes Gait Pattern: Impaired Gait Pattern: Step-through pattern;Trunk flexed;Lateral trunk lean to left;Shuffle;Decreased stride length;Decreased step length - right;Decreased step length - left;Poor foot clearance - left;Poor foot clearance - right;Narrow base of support Gait velocity: decreased Stairs / Additional Locomotion Stairs: Yes Stairs Assistance: Minimal Assistance - Patient > 75% Stair Management Technique: Two rails Number of Stairs: 4 Height of Stairs: 6 Wheelchair Mobility Wheelchair Mobility: No  Trunk/Postural Assessment  Cervical Assessment Cervical Assessment: Exceptions to [facility] (forward head) Thoracic  Assessment Thoracic Assessment: Exceptions to [facility] (thoracic rounding) Lumbar Assessment Lumbar Assessment: Exceptions to [facility] (posterior pelvic tilt in sitting) Postural Control Postural Control: Deficits on evaluation (decreased/delayed, alternating posterior or anterior bias)  Balance Static Sitting Balance Static Sitting - Level of Assistance: 4: Min assist;3: Mod assist Dynamic Sitting Balance Dynamic Sitting - Level of Assistance: 4: Min assist;3: Mod assist Sitting balance - Comments: patient leans posteriorly and keeps legs crossed with feet not touching the floor Static Standing Balance Static Standing - Level of Assistance: 4: Min assist Dynamic Standing Balance Dynamic Standing - Level of Assistance: 3: Mod assist;4: Min assist Extremity Assessment  RLE Assessment RLE Assessment: Exceptions to [facility] Passive Range of Motion (PROM) Comments: WFL General Strength Comments: unable to formally assess due to inability to follow commands but demonstrates grossly 3+/5 to 4/5 functionally LLE Assessment LLE Assessment: Exceptions to [facility] Active Range of Motion (AROM) Comments: [facility] General Strength Comments: unable to formally assess due to inability to follow commands but demonstrates grossly 3+/5 to 4/5 functionally    Ryelee Albee L Aracelis Ulrey PT, DPT  05/02/2021, 4:45 PM

## 2021-05-03 DIAGNOSIS — E039 Hypothyroidism, unspecified: Secondary | ICD-10-CM | POA: Diagnosis not present

## 2021-05-03 DIAGNOSIS — R1312 Dysphagia, oropharyngeal phase: Secondary | ICD-10-CM | POA: Diagnosis not present

## 2021-05-03 DIAGNOSIS — R519 Headache, unspecified: Secondary | ICD-10-CM | POA: Diagnosis not present

## 2021-05-03 DIAGNOSIS — R58 Hemorrhage, not elsewhere classified: Secondary | ICD-10-CM | POA: Diagnosis not present

## 2021-05-03 DIAGNOSIS — S065X2S Traumatic subdural hemorrhage with loss of consciousness of 31 minutes to 59 minutes, sequela: Secondary | ICD-10-CM | POA: Diagnosis not present

## 2021-05-03 DIAGNOSIS — G309 Alzheimer's disease, unspecified: Secondary | ICD-10-CM | POA: Diagnosis not present

## 2021-05-03 DIAGNOSIS — M6281 Muscle weakness (generalized): Secondary | ICD-10-CM | POA: Diagnosis not present

## 2021-05-03 DIAGNOSIS — Z7409 Other reduced mobility: Secondary | ICD-10-CM | POA: Diagnosis not present

## 2021-05-03 DIAGNOSIS — I6200 Nontraumatic subdural hemorrhage, unspecified: Secondary | ICD-10-CM | POA: Diagnosis not present

## 2021-05-03 DIAGNOSIS — K219 Gastro-esophageal reflux disease without esophagitis: Secondary | ICD-10-CM | POA: Diagnosis not present

## 2021-05-03 DIAGNOSIS — I10 Essential (primary) hypertension: Secondary | ICD-10-CM | POA: Diagnosis not present

## 2021-05-03 DIAGNOSIS — N183 Chronic kidney disease, stage 3 unspecified: Secondary | ICD-10-CM | POA: Diagnosis not present

## 2021-05-03 DIAGNOSIS — A499 Bacterial infection, unspecified: Secondary | ICD-10-CM | POA: Diagnosis not present

## 2021-05-03 DIAGNOSIS — S065X9D Traumatic subdural hemorrhage with loss of consciousness of unspecified duration, subsequent encounter: Secondary | ICD-10-CM | POA: Diagnosis not present

## 2021-05-03 DIAGNOSIS — Z741 Need for assistance with personal care: Secondary | ICD-10-CM | POA: Diagnosis not present

## 2021-05-03 DIAGNOSIS — R404 Transient alteration of awareness: Secondary | ICD-10-CM | POA: Diagnosis not present

## 2021-05-03 DIAGNOSIS — R0902 Hypoxemia: Secondary | ICD-10-CM | POA: Diagnosis not present

## 2021-05-03 DIAGNOSIS — Z7401 Bed confinement status: Secondary | ICD-10-CM | POA: Diagnosis not present

## 2021-05-03 DIAGNOSIS — R2681 Unsteadiness on feet: Secondary | ICD-10-CM | POA: Diagnosis not present

## 2021-05-03 DIAGNOSIS — R1013 Epigastric pain: Secondary | ICD-10-CM | POA: Diagnosis not present

## 2021-05-03 DIAGNOSIS — I950 Idiopathic hypotension: Secondary | ICD-10-CM | POA: Diagnosis not present

## 2021-05-03 DIAGNOSIS — N390 Urinary tract infection, site not specified: Secondary | ICD-10-CM | POA: Diagnosis not present

## 2021-05-03 NOTE — Progress Notes (Signed)
INPATIENT REHABILITATION DISCHARGE NOTE   Discharge instructions by: Provider  Verbalized understanding: Patient unable to respond (dementia)  Skin care/Wound care: WDL   Pain: none  IV's: none  Tubes/Drains: none  Safety instructions: given to patient and EMS  Patient belongings: sent with patient  Discharged to:SNF  Discharged via: stretcher, EMS

## 2021-05-03 NOTE — Progress Notes (Signed)
PROGRESS NOTE   Subjective/Complaints: Rested well. No new issues  ROS: Limited due to cognitive/behavioral   Objective:   No results found. No results for input(s): WBC, HGB, HCT, PLT in the last 72 hours.   No results for input(s): NA, K, CL, CO2, GLUCOSE, BUN, CREATININE, CALCIUM in the last 72 hours.    Intake/Output Summary (Last 24 hours) at 05/03/2021 0843 Last data filed at 05/03/2021 0842 Gross per 24 hour  Intake 400 ml  Output --  Net 400 ml         Physical Exam: Vital Signs Blood pressure (!) 105/50, pulse (!) 55, temperature 98.1 F (36.7 C), resp. rate 18, height '5\' 3"'$  (1.6 m), weight 61.5 kg, SpO2 96 %.  Constitutional: No distress . Vital signs reviewed. HEENT: NCAT, EOMI, oral membranes moist Neck: supple Cardiovascular: RRR without murmur. No JVD    Respiratory/Chest: CTA Bilaterally without wheezes or rales. Normal effort    GI/Abdomen: BS +, non-tender, non-distended Ext: no clubbing, cyanosis, or edema Psych: pleasant but flat  Skin: No evidence of breakdown, no evidence of rash Musc: No edema in extremities.  No tenderness in extremities. Neuro: Alert. Makes eye contact. Slow to initiate Motor: moves all 4's. Senses pain.  HOH, communicates on limited basis with hearing aides in. Limited insight and awareness.   Assessment/Plan: 1. Functional deficits which require 3+ hours per day of interdisciplinary therapy in a comprehensive inpatient rehab setting. Physiatrist is providing close team supervision and 24 hour management of active medical problems listed below. Physiatrist and rehab team continue to assess barriers to discharge/monitor patient progress toward functional and medical goals  Care Tool:  Bathing    Body parts bathed by patient: Face, Front perineal area, Right upper leg, Left upper leg, Buttocks, Left lower leg, Right lower leg, Chest   Body parts bathed by helper:  Right arm, Left arm, Chest, Abdomen, Buttocks, Right lower leg, Left lower leg     Bathing assist Assist Level: Minimal Assistance - Patient > 75%     Upper Body Dressing/Undressing Upper body dressing   What is the patient wearing?: Pull over shirt    Upper body assist Assist Level: Moderate Assistance - Patient 50 - 74%    Lower Body Dressing/Undressing Lower body dressing      What is the patient wearing?: Pants     Lower body assist Assist for lower body dressing: Moderate Assistance - Patient 50 - 74%     Toileting Toileting    Toileting assist Assist for toileting: Maximal Assistance - Patient 25 - 49%     Transfers Chair/bed transfer  Transfers assist     Chair/bed transfer assist level: Minimal Assistance - Patient > 75% Chair/bed transfer assistive device: Programmer, multimedia   Ambulation assist      Assist level: Minimal Assistance - Patient > 75% Assistive device: Hand held assist Max distance: 25 ft   Walk 10 feet activity   Assist     Assist level: Minimal Assistance - Patient > 75% Assistive device: Hand held assist   Walk 50 feet activity   Assist    Assist level: Moderate Assistance - Patient -  50 - 74% Assistive device: Hand held assist    Walk 150 feet activity   Assist Walk 150 feet activity did not occur: Safety/medical concerns         Walk 10 feet on uneven surface  activity   Assist Walk 10 feet on uneven surfaces activity did not occur: Safety/medical concerns         Wheelchair     Assist Is the patient using a wheelchair?: No             Wheelchair 50 feet with 2 turns activity    Assist            Wheelchair 150 feet activity     Assist          Blood pressure (!) 105/50, pulse (!) 55, temperature 98.1 F (36.7 C), resp. rate 18, height '5\' 3"'$  (1.6 m), weight 61.5 kg, SpO2 96 %.  Medical Problem List and Plan: 1.  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   -DC to SNF today  -can f/u at [facility] over next 4-6 weeks 2.  Antithrombotics: -DVT/anticoagulation:  Mechanical: Sequential compression devices, below knee Bilateral lower extremities             -antiplatelet therapy: N/A 3. Pain Management: Tylenol prn  -gabapentin for chronic headache  Appears to be controlled on 8/26 4. Mood: LCSW to follow for evaluation and support.              -antipsychotic agents: N/a 5. Neuropsych: This patient is not capable of making decisions on her own behalf.  -can continue amantadine for whatever assistance it provides for initiation 6. Skin/Wound Care: Routine pressure relief measures.  7. Fluids/Electrolytes/Nutrition: Monitor I/Os.             -PO intake reasonable -continue to encourage  Recent labs ok 8. Kleb pneumo UTI: S to keflex--continue x 4 days total ( in addition to 3d rocephin), completed 8/10 9. Alzheimer's dementia:Continue Namenda and aricept             --Zoloft for mood stabilization             10 Hypothyroid: On supplement. 11. Acute blood loss anemia:   Hemoglobin 11.5 on 8/22 12. Orthostatic Hypotension: Encourage fluid intake. Continue ProAmatine 5 mg tid.   Vitals:   05/02/21 1947 05/03/21 0509  BP: 107/65 (!) 105/50  Pulse: 62 (!) 55  Resp: 18 18  Temp: 98.1 F (36.7 C) 98.1 F (36.7 C)  SpO2: 98% 96%   8/25 bp stable. Continue midodrine as above  LOS: 21 days A FACE TO [place] 05/03/2021, 8:43 AM

## 2021-05-03 NOTE — Progress Notes (Signed)
Speech Language Pathology Discharge Summary  Patient Details  Name: Michelle [name] MRN: [id number] Date of Birth: 1945/12/22   Patient has met 4 of 4 long term goals.  Patient to discharge at overall Max level.   Reasons goals not met: N/A   Clinical Impression/Discharge Summary: Patient has made minimal and inconsistent gains but has met 4 of 4 LTGs this admission. Currently, patient is consuming Dys. 3 textures with thin liquids with minimal overt s/s of aspiration and Min verbal cues for use of swallowing compensatory strategies. 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 Patient's overall cognitive deficits also impact initiation with verbal expression resulting in minimal verbal output and minimal ability to express basic wants/needs. Family education is complete. Patient's family is unable to provide the necessary level of physical and cognitive assistance needed at this time, therefore, patient will discharge to a SNF. Patient would benefit from f/u SLP services to maximize her cognitive and swallowing function as well as her overall functional communication in order to reduce caregiver burden.   Care Partner:  Caregiver Able to Provide Assistance: No  Type of Caregiver Assistance: Physical;Cognitive  Recommendation:  Skilled Nursing facility;24 hour supervision/assistance  Rationale for SLP Follow Up: Maximize functional communication;Reduce caregiver burden;Maximize cognitive function and independence;Maximize swallowing safety   Equipment: N/A   Reasons for discharge: Discharged from hospital   Patient/Family Agrees with Progress Made and Goals Achieved: Yes    [name], [name] 05/03/2021, 6:17 AM

## 2021-05-06 DIAGNOSIS — Z7409 Other reduced mobility: Secondary | ICD-10-CM | POA: Diagnosis not present

## 2021-05-06 DIAGNOSIS — R2681 Unsteadiness on feet: Secondary | ICD-10-CM | POA: Diagnosis not present

## 2021-05-06 DIAGNOSIS — S065X9D Traumatic subdural hemorrhage with loss of consciousness of unspecified duration, subsequent encounter: Secondary | ICD-10-CM | POA: Diagnosis not present

## 2021-05-06 DIAGNOSIS — R1312 Dysphagia, oropharyngeal phase: Secondary | ICD-10-CM | POA: Diagnosis not present

## 2021-05-06 DIAGNOSIS — N183 Chronic kidney disease, stage 3 unspecified: Secondary | ICD-10-CM | POA: Diagnosis not present

## 2021-05-06 DIAGNOSIS — Z741 Need for assistance with personal care: Secondary | ICD-10-CM | POA: Diagnosis not present

## 2021-05-06 DIAGNOSIS — M6281 Muscle weakness (generalized): Secondary | ICD-10-CM | POA: Diagnosis not present

## 2021-05-08 ENCOUNTER — Telehealth: Payer: Self-pay | Admitting: Family Medicine

## 2021-05-08 NOTE — Telephone Encounter (Signed)
LVM for pt ot rtn my call to schedule AWV with [facility]. Please schedule this appt if pt calls the office.

## 2021-05-09 DIAGNOSIS — R519 Headache, unspecified: Secondary | ICD-10-CM | POA: Diagnosis not present

## 2021-05-09 DIAGNOSIS — Z741 Need for assistance with personal care: Secondary | ICD-10-CM | POA: Diagnosis not present

## 2021-05-09 DIAGNOSIS — S065X9D Traumatic subdural hemorrhage with loss of consciousness of unspecified duration, subsequent encounter: Secondary | ICD-10-CM | POA: Diagnosis not present

## 2021-05-09 DIAGNOSIS — I6200 Nontraumatic subdural hemorrhage, unspecified: Secondary | ICD-10-CM | POA: Diagnosis not present

## 2021-05-09 DIAGNOSIS — K219 Gastro-esophageal reflux disease without esophagitis: Secondary | ICD-10-CM | POA: Diagnosis not present

## 2021-05-09 DIAGNOSIS — Z7409 Other reduced mobility: Secondary | ICD-10-CM | POA: Diagnosis not present

## 2021-05-09 DIAGNOSIS — M6281 Muscle weakness (generalized): Secondary | ICD-10-CM | POA: Diagnosis not present

## 2021-05-09 DIAGNOSIS — E039 Hypothyroidism, unspecified: Secondary | ICD-10-CM | POA: Diagnosis not present

## 2021-05-09 DIAGNOSIS — R2681 Unsteadiness on feet: Secondary | ICD-10-CM | POA: Diagnosis not present

## 2021-05-09 DIAGNOSIS — I10 Essential (primary) hypertension: Secondary | ICD-10-CM | POA: Diagnosis not present

## 2021-05-09 DIAGNOSIS — G309 Alzheimer's disease, unspecified: Secondary | ICD-10-CM | POA: Diagnosis not present

## 2021-05-09 DIAGNOSIS — R1312 Dysphagia, oropharyngeal phase: Secondary | ICD-10-CM | POA: Diagnosis not present

## 2021-05-09 DIAGNOSIS — R1013 Epigastric pain: Secondary | ICD-10-CM | POA: Diagnosis not present

## 2021-05-09 DIAGNOSIS — I950 Idiopathic hypotension: Secondary | ICD-10-CM | POA: Diagnosis not present

## 2021-05-15 DIAGNOSIS — I950 Idiopathic hypotension: Secondary | ICD-10-CM | POA: Diagnosis not present

## 2021-05-15 DIAGNOSIS — R2681 Unsteadiness on feet: Secondary | ICD-10-CM | POA: Diagnosis not present

## 2021-05-15 DIAGNOSIS — K219 Gastro-esophageal reflux disease without esophagitis: Secondary | ICD-10-CM | POA: Diagnosis not present

## 2021-05-15 DIAGNOSIS — R1013 Epigastric pain: Secondary | ICD-10-CM | POA: Diagnosis not present

## 2021-05-15 DIAGNOSIS — E039 Hypothyroidism, unspecified: Secondary | ICD-10-CM | POA: Diagnosis not present

## 2021-05-15 DIAGNOSIS — I6200 Nontraumatic subdural hemorrhage, unspecified: Secondary | ICD-10-CM | POA: Diagnosis not present

## 2021-05-15 DIAGNOSIS — G309 Alzheimer's disease, unspecified: Secondary | ICD-10-CM | POA: Diagnosis not present

## 2021-05-20 DIAGNOSIS — I6200 Nontraumatic subdural hemorrhage, unspecified: Secondary | ICD-10-CM | POA: Diagnosis not present

## 2021-05-20 DIAGNOSIS — G309 Alzheimer's disease, unspecified: Secondary | ICD-10-CM | POA: Diagnosis not present

## 2021-05-20 DIAGNOSIS — K219 Gastro-esophageal reflux disease without esophagitis: Secondary | ICD-10-CM | POA: Diagnosis not present

## 2021-05-20 DIAGNOSIS — E039 Hypothyroidism, unspecified: Secondary | ICD-10-CM | POA: Diagnosis not present

## 2021-05-20 DIAGNOSIS — I950 Idiopathic hypotension: Secondary | ICD-10-CM | POA: Diagnosis not present

## 2021-05-20 DIAGNOSIS — R1013 Epigastric pain: Secondary | ICD-10-CM | POA: Diagnosis not present

## 2021-05-20 DIAGNOSIS — R2681 Unsteadiness on feet: Secondary | ICD-10-CM | POA: Diagnosis not present

## 2021-05-20 DIAGNOSIS — R519 Headache, unspecified: Secondary | ICD-10-CM | POA: Diagnosis not present

## 2021-05-20 DIAGNOSIS — I10 Essential (primary) hypertension: Secondary | ICD-10-CM | POA: Diagnosis not present

## 2021-05-21 ENCOUNTER — Telehealth: Payer: Self-pay

## 2021-05-21 NOTE — Telephone Encounter (Signed)
.  Caller/Agency: Ronalee Belts with [facility] Number: [phone] Requesting OT/PT/Skilled Nursing/Social Work/Speech Therapy: Ronalee Belts called stating pt is leaving skilled nursing facility and is wanting to know if PCP will follow up for [facility] services.  Please advise. Frequency:

## 2021-05-22 ENCOUNTER — Telehealth: Payer: Self-pay | Admitting: Family Medicine

## 2021-05-22 ENCOUNTER — Telehealth: Payer: Self-pay | Admitting: *Deleted

## 2021-05-22 DIAGNOSIS — E039 Hypothyroidism, unspecified: Secondary | ICD-10-CM | POA: Diagnosis not present

## 2021-05-22 DIAGNOSIS — J449 Chronic obstructive pulmonary disease, unspecified: Secondary | ICD-10-CM | POA: Diagnosis not present

## 2021-05-22 DIAGNOSIS — I10 Essential (primary) hypertension: Secondary | ICD-10-CM | POA: Diagnosis not present

## 2021-05-22 DIAGNOSIS — G20 Parkinson's disease: Secondary | ICD-10-CM | POA: Diagnosis not present

## 2021-05-22 DIAGNOSIS — R519 Headache, unspecified: Secondary | ICD-10-CM | POA: Diagnosis not present

## 2021-05-22 DIAGNOSIS — R1013 Epigastric pain: Secondary | ICD-10-CM | POA: Diagnosis not present

## 2021-05-22 DIAGNOSIS — S066X9D Traumatic subarachnoid hemorrhage with loss of consciousness of unspecified duration, subsequent encounter: Secondary | ICD-10-CM | POA: Diagnosis not present

## 2021-05-22 DIAGNOSIS — G8929 Other chronic pain: Secondary | ICD-10-CM | POA: Diagnosis not present

## 2021-05-22 DIAGNOSIS — Z9181 History of falling: Secondary | ICD-10-CM | POA: Diagnosis not present

## 2021-05-22 DIAGNOSIS — F32A Depression, unspecified: Secondary | ICD-10-CM | POA: Diagnosis not present

## 2021-05-22 DIAGNOSIS — G301 Alzheimer's disease with late onset: Secondary | ICD-10-CM | POA: Diagnosis not present

## 2021-05-22 NOTE — Telephone Encounter (Signed)
Please schedule patient for virtual visit. Please call pt's daughter, Abigail Butts. Phone number below. Thank you!

## 2021-05-22 NOTE — Telephone Encounter (Signed)
Verbal given 

## 2021-05-22 NOTE — Telephone Encounter (Signed)
Spoke with pt's dtr, Abigail Butts, and she stated the visit would have to be a VV as pt was in [facility] hospital then transferred to [place] facility and now being released to home care.  Pt suffered brain bleed and that along with her dementia, fall, etc. is not allowing for an in person visit.  Please call Abigail Butts back to set up VV for pt.  [facility] CB # [phone].

## 2021-05-22 NOTE — Telephone Encounter (Signed)
Who Is Calling Patient / Member / Family / Caregiver Caller Name Michelle [name] Caller Phone Number [phone] Patient Name Michelle [name] Patient DOB Jan 31, 1946 Call Type Message Only Information Provided Reason for Call Request to Schedule Office Appointment Initial Comment Caller states her mom fell in August and was in the hospital for a week and taken to a rehab following discharge. She was just released home today, she is still in need of a lot of assistance and a Education officer, museum at her rehab told her since she hasn't see the doctor within a year she cannot set up in home care without the doctor's approval. Her mobility is very limited so caller doesn't think they can get her in the office. Patient request to speak to RN No Additional Comment Caller needs a virtual visit. Office hours provided. Disp. Time Disposition Final User 05/21/2021 5:13:34 PM General Information Provided Yes Melanee Spry

## 2021-05-22 NOTE — Telephone Encounter (Signed)
[name] from home health called in to inform Michelle [name] that pt was released with new meds amantadine (SYMMETREL) 100 MG capsule  and husband does not want wife to take it. He does not understand why she needs to take the meds. If any new questions develop please contact [name] at [phone]. Please advise.

## 2021-05-22 NOTE — Telephone Encounter (Signed)
Would you be okay with doing a virtual visit or should we try to bring her in?

## 2021-05-23 ENCOUNTER — Telehealth (INDEPENDENT_AMBULATORY_CARE_PROVIDER_SITE_OTHER): Payer: Medicare Other | Admitting: Family Medicine

## 2021-05-23 ENCOUNTER — Encounter: Payer: Self-pay | Admitting: Family Medicine

## 2021-05-23 ENCOUNTER — Other Ambulatory Visit: Payer: Self-pay

## 2021-05-23 VITALS — Temp 97.2°F

## 2021-05-23 DIAGNOSIS — G309 Alzheimer's disease, unspecified: Secondary | ICD-10-CM | POA: Diagnosis not present

## 2021-05-23 DIAGNOSIS — S065X0A Traumatic subdural hemorrhage without loss of consciousness, initial encounter: Secondary | ICD-10-CM | POA: Diagnosis not present

## 2021-05-23 DIAGNOSIS — F0280 Dementia in other diseases classified elsewhere without behavioral disturbance: Secondary | ICD-10-CM | POA: Diagnosis not present

## 2021-05-23 DIAGNOSIS — G301 Alzheimer's disease with late onset: Secondary | ICD-10-CM

## 2021-05-23 DIAGNOSIS — F02818 Dementia in other diseases classified elsewhere, unspecified severity, with other behavioral disturbance: Secondary | ICD-10-CM

## 2021-05-23 DIAGNOSIS — F0281 Dementia in other diseases classified elsewhere with behavioral disturbance: Secondary | ICD-10-CM

## 2021-05-23 NOTE — Patient Instructions (Signed)
Alzheimer's Disease Caregiver Guide Alzheimer's disease is a brain disease that causes memory loss and changes in behavior. People with Alzheimer's disease often have problems paying attention, communicating, and doing routine tasks. The disease gets worse over time, and people with the disease eventually need full-time care. Taking care of someone with Alzheimer's disease can be challenging and overwhelming. This guide provides helpful information and tips that can make caring for someone with the disease a little easier. How to help manage lifestyle changes Managing symptoms Be calm and patient. Give short, simple answers to questions. Long answers can overwhelm and confuse the person. Avoid correcting the person in a negative way. Try not to take things personally, even if the person forgets your name. Understand that changes are a part of the disease process. Do not argue or try to convince the person about a specific point. Doing that may make the person feel more agitated. Reducing frustration Make appointments and do daily tasks, like bathing and dressing, when the person is at his or her best. Allow for plenty of time for simple tasks because they may take longer than expected. Take your time when doing these tasks. Limit the person's choices. Too many choices can be overwhelming and stressful for the person. Involve the person in what you are doing. Take steps to help keep things organized such as: Keeping a daily routine. Organizing medicines in a pillbox for each day of the week. Keeping a calendar in a central location to remind the person of appointments or other activities. Avoid crowds and new situations, if possible. Use simple words, short sentences, and a calm voice. Only give one direction at a time. Buy clothes and shoes for the person that are easy to put on and take off. Try to change the subject or topic if the person becomes frustrated or angry. Distraction is a great  technique for making a situation less tense. Reducing the risk of injury  Keep floors clear of clutter. Remove rugs, magazine racks, and floor lamps. Keep hallways well-lit, especially at night. Put a handrail and nonslip mat in the bathtub or shower. Put childproof locks on cabinets that contain dangerous items, such as medicines, alcohol, guns, toxic cleaning items, sharp tools or utensils, matches, and lighters. Put locks on doors. Put the locks in places where the person cannot see or reach them easily. This will help ensure that the person does not wander out of the house and get lost. Be prepared for emergencies. Keep a list of emergency phone numbers and addresses in a convenient area. Remove car keys and lock garage doors so that the person does not try to get in the car and drive. A certain type of bracelet may be worn that tracks a person's location and identifies him or her as having memory problems. This should be worn at all times for safety. Planning for the future  Discuss financial and legal planning early on in the course of the disease. People with Alzheimer's disease will have trouble managing their money as the disease gets worse. Get help from professional advisers regarding financial and legal matters. Discuss advance directives, safety, and daily care. Take these steps: Create a living will and choose a power of attorney. The person with power of attorney will be able to make decisions for the person with Alzheimer's disease when he or she is no longer able to. Discuss driving safety and when to stop driving. The person's health care provider can help provide assistance with this  decision. Discuss the person's living situation. If the person lives alone, make sure he or she is safe. People who live at home may need extra help from home health caregivers, and those who live in a nursing home or care center may need more care. How to recognize changes in the person's  condition Alzheimer's disease causes a person to lose the ability to remember things and make decisions. Memory loss and confusion are usually mild at the start of the disease, but they slowly get more severe over time. Eventually, the person may not recognize friends, family members, or familiar places. Alzheimer's disease can also cause hallucinations, changes in behavior, and changes in mood, such as anxiety or anger. The changes can come on suddenly. They may happen in response to something such as: Pain. An infection. Changes in environment, such as changes in temperature or noise. Overstimulation. Feeling lost or scared. Medicines. Where to find support Ask about respite care resources. Respite care can provide short-term care for the person so that you can have a regular break from the stress of caregiving. Consider joining a local support group. Advantages of being part of a support group include: Learning strategies to manage stress. Sharing experiences with others. Receiving emotional comfort and support. Learning about caregiving as the disease progresses. Knowing what community resources are available and making use of them. Where to find more information Alzheimer's Association: [employer] Contact a health care provider if: The person has a fever. The person has a sudden change in behavior that does not improve with calming strategies. The person is unable to manage in his or her current living situation. You are no longer able to care for the person. Get help right away if: The person has a sudden increase in confusion or new hallucinations. The person threatens himself or herself, you, or anyone else. If you ever feel like your loved one may hurt himself or herself or others, or shares thoughts about taking his or her own life, get help right away. You can go to your nearest emergency department or: Call your local emergency services (911 in the [place] Call a suicide  crisis helpline, such as the [facility] at (810)146-0965. This is open 24 hours a day in the [place] Text the Crisis Text Line at (937)817-2572 (in the [place] Summary Alzheimer's disease is a brain disease that causes memory loss and changes in behavior. People with Alzheimer's disease often have problems paying attention, communicating, and doing routine tasks. The disease gets worse over time, and people with the disease eventually need full-time care. Take steps to reduce the person's risk of injury and plan for future care. Taking care of someone with Alzheimer's disease can be very challenging and overwhelming. One way to find support during this time is to join a local support group. This information is not intended to replace advice given to you by your health care provider. Make sure you discuss any questions you have with your health care provider. Document Revised: 12/12/2019 Document Reviewed: 12/12/2019 Elsevier Patient Education  2022 [employer].

## 2021-05-23 NOTE — Telephone Encounter (Signed)
noted 

## 2021-05-23 NOTE — Assessment & Plan Note (Signed)
Pt improving  HH will provide pt/ ot

## 2021-05-23 NOTE — Assessment & Plan Note (Signed)
HH coming to house Pt has app with hospice ---as well

## 2021-05-23 NOTE — Progress Notes (Signed)
MyChart Video Visit    Virtual Visit via Video Note   This visit type was conducted due to national recommendations for restrictions regarding the COVID-19 Pandemic (e.g. social distancing) in an effort to limit this patient's exposure and mitigate transmission in our community. This patient is at least at moderate risk for complications without adequate follow up. This format is felt to be most appropriate for this patient at this time. Physical exam was limited by quality of the video and audio technology used for the visit. Alinda Dooms was able to get the patient set up on a video visit.  Patient location: Home Patient and provider in visit Provider location: Office  I discussed the limitations of evaluation and management by telemedicine and the availability of in person appointments. The patient expressed understanding and agreed to proceed.  Visit Date: 05/23/2021  Today's healthcare provider: Ann Held, DO     Subjective:    Patient ID: Michelle [name], female    DOB: [birth year], 75 y.o.   MRN: [id number]  Chief Complaint  Patient presents with   Hospitalization Follow-up    HPI Patient is in today for a video visit. Her husband was present during the visit to assist with communication.  She was prescribed amantadine 100 mg to manage her flu. She recently had one episode of fall. Her husband does not want her to take the medication. She was not feeling well after taking the medication in the nursing home However, she is doing well today. Denies fever, nausea or chills.  Her temperature was 97.2 today. Her husband states her blood pressure was not checked today. She is getting flu vaccine soon. She have a history of dementia, HTN, HLD, thyroid disease and Glaucoma.    Past Medical History:  Diagnosis Date   Adenomatous colon polyp    Allergy    Dementia (HCC)    GERD (gastroesophageal reflux disease)    Glaucoma    History of chronic cough    Hx of  hearing loss    bilateral hearing aids   Hyperlipidemia    Hypertension    IBS (irritable bowel syndrome)    Low back pain    Thyroid disease    UTI due to Klebsiella species     Past Surgical History:  Procedure Laterality Date   ABDOMINAL HYSTERECTOMY  1998   CATARACT EXTRACTION, BILATERAL Bilateral 04/2015   CHOLECYSTECTOMY  1998   COLONOSCOPY  12/10/2016   GUM SURGERY  1996 / 2013   skin graft on gums   TUBAL LIGATION  1977    Family History  Problem Relation Age of Onset   Macular degeneration Father    Prostate cancer Father    Hypertension Father    Cancer Father        prostate   Glaucoma Mother    Emphysema Mother    Hypertension Mother    Other Brother        Brain tumor   Cancer Brother 56       brain tumor   Breast cancer Maternal Grandmother    Prostate cancer Brother    Colon cancer Neg Hx    Colon polyps Neg Hx    Esophageal cancer Neg Hx    Rectal cancer Neg Hx    Stomach cancer Neg Hx     Social History   Socioeconomic History   Marital status: Married    Spouse name: Juanda Crumble, "Butch"   Number of children: 2  Years of education: Not on file   Highest education level: Not on file  Occupational History   Occupation: retired    Fish farm manager: UNEMPLOYED  Tobacco Use   Smoking status: Never   Smokeless tobacco: Never  Vaping Use   Vaping Use: Never used  Substance and Sexual Activity   Alcohol use: No   Drug use: No   Sexual activity: Yes    Partners: Male    Birth control/protection: Surgical  Other Topics Concern   Not on file  Social History Narrative   Exercise--no   Lives with husband   One story home   Right handed   Social Determinants of Health   Financial Resource Strain: Low Risk    Difficulty of Paying Living Expenses: Not hard at all  Food Insecurity: No Food Insecurity   Worried About Charity fundraiser in the Last Year: Never true   Arboriculturist in the Last Year: Never true  Transportation Needs: Not on file   Physical Activity: Not on file  Stress: Not on file  Social Connections: Not on file  Intimate Partner Violence: Not on file    Outpatient Medications Prior to Visit  Medication Sig Dispense Refill   acetaminophen (TYLENOL) 325 MG tablet Take 1-2 tablets (325-650 mg total) by mouth every 4 (four) hours as needed for mild pain.     amantadine (SYMMETREL) 100 MG capsule Take 1 capsule (100 mg total) by mouth 2 (two) times daily.     fluticasone (FLONASE) 50 MCG/ACT nasal spray Place 2 sprays into both nostrils daily. 16 g 5   gabapentin (NEURONTIN) 300 MG capsule Take 1 cap twice a day 180 capsule 3   Lactobacillus-Inulin (CULTURELLE DIGESTIVE DAILY PO) Take 1 tablet by mouth daily.      levothyroxine (SYNTHROID) 50 MCG tablet TAKE 1 TABLET BY MOUTH EVERY DAY BEFORE BREAKFAST 30 tablet 0   loratadine (CLARITIN) 10 MG tablet TAKE 1 TABLET BY MOUTH EVERY DAY 90 tablet 1   memantine (NAMENDA) 10 MG tablet Take 1 tablet (10 mg total) by mouth 2 (two) times daily. 180 tablet 3   midodrine (PROAMATINE) 5 MG tablet TAKE 1 TABLET (5 MG TOTAL) BY MOUTH 3 (THREE) TIMES DAILY WITH MEALS. 270 tablet 1   Multiple Vitamin (MULTIVITAMIN) tablet Take 1 tablet by mouth daily.     polyethylene glycol (MIRALAX / GLYCOLAX) 17 g packet Take 17 g by mouth daily as needed for mild constipation. 14 each 0   senna-docusate (SENOKOT-S) 8.6-50 MG tablet Take 2 tablets by mouth at bedtime.     sertraline (ZOLOFT) 100 MG tablet Take 1 tablet (100 mg total) by mouth daily. 90 tablet 3   No facility-administered medications prior to visit.    No Known Allergies  Review of Systems  Constitutional:  Negative for chills, fever and malaise/fatigue.  HENT:  Negative for congestion.   Eyes:  Negative for blurred vision.  Respiratory:  Negative for cough and shortness of breath.   Cardiovascular:  Negative for chest pain, palpitations and leg swelling.  Gastrointestinal:  Negative for nausea and vomiting.   Musculoskeletal:  Negative for back pain.  Skin:  Negative for rash.  Neurological:  Negative for loss of consciousness and headaches.      Objective:    Physical Exam Vitals and nursing note reviewed.  Constitutional:      Appearance: She is well-developed.  HENT:     Head: Normocephalic and atraumatic.  Eyes:  Conjunctiva/sclera: Conjunctivae normal.  Neck:     Thyroid: No thyromegaly.     Vascular: No carotid bruit or JVD.  Cardiovascular:     Rate and Rhythm: Normal rate and regular rhythm.     Heart sounds: Normal heart sounds. No murmur heard. Pulmonary:     Effort: Pulmonary effort is normal. No respiratory distress.     Breath sounds: Normal breath sounds. No wheezing or rales.  Chest:     Chest wall: No tenderness.  Musculoskeletal:     Cervical back: Normal range of motion and neck supple.  Neurological:     Mental Status: She is alert and oriented to person, place, and time.    Temp (!) 97.2 F (36.2 C)  Wt Readings from Last 3 Encounters:  04/19/21 135 lb 9.3 oz (61.5 kg)  04/10/21 135 lb 5.8 oz (61.4 kg)  02/25/21 134 lb (60.8 kg)    Diabetic Foot Exam - Simple   No data filed    Lab Results  Component Value Date   WBC 6.9 04/29/2021   HGB 11.5 (L) 04/29/2021   HCT 35.0 (L) 04/29/2021   PLT 292 04/29/2021   GLUCOSE 92 04/29/2021   CHOL 178 12/26/2019   TRIG 80.0 12/26/2019   HDL 66.90 12/26/2019   LDLCALC 95 12/26/2019   ALT 14 02/08/2020   AST 21 02/08/2020   NA 139 04/29/2021   K 3.7 04/29/2021   CL 105 04/29/2021   CREATININE 0.81 04/29/2021   [name] 16 04/29/2021   CO2 30 04/29/2021   TSH 3.332 02/08/2020   MICROALBUR 1.0 11/03/2014    Lab Results  Component Value Date   TSH 3.332 02/08/2020   Lab Results  Component Value Date   WBC 6.9 04/29/2021   HGB 11.5 (L) 04/29/2021   HCT 35.0 (L) 04/29/2021   MCV 88.6 04/29/2021   PLT 292 04/29/2021   Lab Results  Component Value Date   NA 139 04/29/2021   K 3.7 04/29/2021    CO2 30 04/29/2021   GLUCOSE 92 04/29/2021   [name] 16 04/29/2021   CREATININE 0.81 04/29/2021   BILITOT 0.8 02/08/2020   ALKPHOS 42 02/08/2020   AST 21 02/08/2020   ALT 14 02/08/2020   PROT 6.0 (L) 02/08/2020   ALBUMIN 3.5 02/08/2020   CALCIUM 9.1 04/29/2021   ANIONGAP 4 (L) 04/29/2021   GFR 50.67 (L) 02/16/2020   Lab Results  Component Value Date   CHOL 178 12/26/2019   Lab Results  Component Value Date   HDL 66.90 12/26/2019   Lab Results  Component Value Date   LDLCALC 95 12/26/2019   Lab Results  Component Value Date   TRIG 80.0 12/26/2019   Lab Results  Component Value Date   CHOLHDL 3 12/26/2019   No results found for: HGBA1C     Assessment & Plan:   Problem List Items Addressed This Visit       Unprioritized   Alzheimer's dementia without behavioral disturbance ([place])   Late onset Alzheimer's dementia with behavioral disturbance ([place])    [place] coming to house Pt has app with hospice ---as well      Subdural hemorrhage following injury ([place]) - Primary    Pt improving  HH will provide pt/ ot         No orders of the defined types were placed in this encounter.   I discussed the assessment and treatment plan with the patient. The patient was provided an opportunity to ask questions and  all were answered. The patient agreed with the plan and demonstrated an understanding of the instructions.   The patient was advised to call back or seek an in-person evaluation if the symptoms worsen or if the condition fails to improve as anticipated.  I,Shehryar Baig,acting as a Education administrator for [employer], DO.,have documented all relevant documentation on the behalf of Ann Held, DO,as directed by  Ann Held, DO while in the presence of Ann Held, DO.  I provided 20 minutes of face-to-face time during this encounter.   Ann Held, DO [place] at [employer] [facility] [phone]  (phone) [phone] (fax)  [place]

## 2021-05-24 DIAGNOSIS — J449 Chronic obstructive pulmonary disease, unspecified: Secondary | ICD-10-CM | POA: Diagnosis not present

## 2021-05-24 DIAGNOSIS — G20 Parkinson's disease: Secondary | ICD-10-CM | POA: Diagnosis not present

## 2021-05-24 DIAGNOSIS — I10 Essential (primary) hypertension: Secondary | ICD-10-CM | POA: Diagnosis not present

## 2021-05-24 DIAGNOSIS — F32A Depression, unspecified: Secondary | ICD-10-CM | POA: Diagnosis not present

## 2021-05-24 DIAGNOSIS — Z9181 History of falling: Secondary | ICD-10-CM | POA: Diagnosis not present

## 2021-05-24 DIAGNOSIS — R1013 Epigastric pain: Secondary | ICD-10-CM | POA: Diagnosis not present

## 2021-05-24 DIAGNOSIS — S066X9D Traumatic subarachnoid hemorrhage with loss of consciousness of unspecified duration, subsequent encounter: Secondary | ICD-10-CM | POA: Diagnosis not present

## 2021-05-24 DIAGNOSIS — R519 Headache, unspecified: Secondary | ICD-10-CM | POA: Diagnosis not present

## 2021-05-24 DIAGNOSIS — G8929 Other chronic pain: Secondary | ICD-10-CM | POA: Diagnosis not present

## 2021-05-24 DIAGNOSIS — E039 Hypothyroidism, unspecified: Secondary | ICD-10-CM | POA: Diagnosis not present

## 2021-05-24 DIAGNOSIS — G301 Alzheimer's disease with late onset: Secondary | ICD-10-CM | POA: Diagnosis not present

## 2021-05-28 DIAGNOSIS — S066X9D Traumatic subarachnoid hemorrhage with loss of consciousness of unspecified duration, subsequent encounter: Secondary | ICD-10-CM | POA: Diagnosis not present

## 2021-05-28 DIAGNOSIS — R519 Headache, unspecified: Secondary | ICD-10-CM | POA: Diagnosis not present

## 2021-05-28 DIAGNOSIS — G8929 Other chronic pain: Secondary | ICD-10-CM | POA: Diagnosis not present

## 2021-05-28 DIAGNOSIS — F32A Depression, unspecified: Secondary | ICD-10-CM | POA: Diagnosis not present

## 2021-05-28 DIAGNOSIS — G301 Alzheimer's disease with late onset: Secondary | ICD-10-CM | POA: Diagnosis not present

## 2021-05-28 DIAGNOSIS — Z9181 History of falling: Secondary | ICD-10-CM | POA: Diagnosis not present

## 2021-05-28 DIAGNOSIS — G20 Parkinson's disease: Secondary | ICD-10-CM | POA: Diagnosis not present

## 2021-05-28 DIAGNOSIS — I10 Essential (primary) hypertension: Secondary | ICD-10-CM | POA: Diagnosis not present

## 2021-05-28 DIAGNOSIS — E039 Hypothyroidism, unspecified: Secondary | ICD-10-CM | POA: Diagnosis not present

## 2021-05-28 DIAGNOSIS — R1013 Epigastric pain: Secondary | ICD-10-CM | POA: Diagnosis not present

## 2021-05-28 DIAGNOSIS — J449 Chronic obstructive pulmonary disease, unspecified: Secondary | ICD-10-CM | POA: Diagnosis not present

## 2021-05-29 DIAGNOSIS — G8929 Other chronic pain: Secondary | ICD-10-CM | POA: Diagnosis not present

## 2021-05-29 DIAGNOSIS — J449 Chronic obstructive pulmonary disease, unspecified: Secondary | ICD-10-CM | POA: Diagnosis not present

## 2021-05-29 DIAGNOSIS — S066X9D Traumatic subarachnoid hemorrhage with loss of consciousness of unspecified duration, subsequent encounter: Secondary | ICD-10-CM | POA: Diagnosis not present

## 2021-05-29 DIAGNOSIS — F32A Depression, unspecified: Secondary | ICD-10-CM | POA: Diagnosis not present

## 2021-05-29 DIAGNOSIS — Z9181 History of falling: Secondary | ICD-10-CM | POA: Diagnosis not present

## 2021-05-29 DIAGNOSIS — R1013 Epigastric pain: Secondary | ICD-10-CM | POA: Diagnosis not present

## 2021-05-29 DIAGNOSIS — E039 Hypothyroidism, unspecified: Secondary | ICD-10-CM | POA: Diagnosis not present

## 2021-05-29 DIAGNOSIS — G20 Parkinson's disease: Secondary | ICD-10-CM | POA: Diagnosis not present

## 2021-05-29 DIAGNOSIS — R519 Headache, unspecified: Secondary | ICD-10-CM | POA: Diagnosis not present

## 2021-05-29 DIAGNOSIS — G301 Alzheimer's disease with late onset: Secondary | ICD-10-CM | POA: Diagnosis not present

## 2021-05-29 DIAGNOSIS — I10 Essential (primary) hypertension: Secondary | ICD-10-CM | POA: Diagnosis not present

## 2021-05-30 DIAGNOSIS — S066X9D Traumatic subarachnoid hemorrhage with loss of consciousness of unspecified duration, subsequent encounter: Secondary | ICD-10-CM | POA: Diagnosis not present

## 2021-05-30 DIAGNOSIS — Z9181 History of falling: Secondary | ICD-10-CM | POA: Diagnosis not present

## 2021-05-30 DIAGNOSIS — G8929 Other chronic pain: Secondary | ICD-10-CM | POA: Diagnosis not present

## 2021-05-30 DIAGNOSIS — R1013 Epigastric pain: Secondary | ICD-10-CM | POA: Diagnosis not present

## 2021-05-30 DIAGNOSIS — J449 Chronic obstructive pulmonary disease, unspecified: Secondary | ICD-10-CM | POA: Diagnosis not present

## 2021-05-30 DIAGNOSIS — R519 Headache, unspecified: Secondary | ICD-10-CM | POA: Diagnosis not present

## 2021-05-30 DIAGNOSIS — I10 Essential (primary) hypertension: Secondary | ICD-10-CM | POA: Diagnosis not present

## 2021-05-30 DIAGNOSIS — G20 Parkinson's disease: Secondary | ICD-10-CM | POA: Diagnosis not present

## 2021-05-30 DIAGNOSIS — G301 Alzheimer's disease with late onset: Secondary | ICD-10-CM | POA: Diagnosis not present

## 2021-05-30 DIAGNOSIS — F32A Depression, unspecified: Secondary | ICD-10-CM | POA: Diagnosis not present

## 2021-05-30 DIAGNOSIS — E039 Hypothyroidism, unspecified: Secondary | ICD-10-CM | POA: Diagnosis not present

## 2021-06-04 DIAGNOSIS — R1013 Epigastric pain: Secondary | ICD-10-CM | POA: Diagnosis not present

## 2021-06-04 DIAGNOSIS — G8929 Other chronic pain: Secondary | ICD-10-CM | POA: Diagnosis not present

## 2021-06-04 DIAGNOSIS — I10 Essential (primary) hypertension: Secondary | ICD-10-CM | POA: Diagnosis not present

## 2021-06-04 DIAGNOSIS — J449 Chronic obstructive pulmonary disease, unspecified: Secondary | ICD-10-CM | POA: Diagnosis not present

## 2021-06-04 DIAGNOSIS — F32A Depression, unspecified: Secondary | ICD-10-CM | POA: Diagnosis not present

## 2021-06-04 DIAGNOSIS — R519 Headache, unspecified: Secondary | ICD-10-CM | POA: Diagnosis not present

## 2021-06-04 DIAGNOSIS — G301 Alzheimer's disease with late onset: Secondary | ICD-10-CM | POA: Diagnosis not present

## 2021-06-04 DIAGNOSIS — E039 Hypothyroidism, unspecified: Secondary | ICD-10-CM | POA: Diagnosis not present

## 2021-06-04 DIAGNOSIS — Z9181 History of falling: Secondary | ICD-10-CM | POA: Diagnosis not present

## 2021-06-04 DIAGNOSIS — G20 Parkinson's disease: Secondary | ICD-10-CM | POA: Diagnosis not present

## 2021-06-04 DIAGNOSIS — S066X9D Traumatic subarachnoid hemorrhage with loss of consciousness of unspecified duration, subsequent encounter: Secondary | ICD-10-CM | POA: Diagnosis not present

## 2021-06-06 DIAGNOSIS — I10 Essential (primary) hypertension: Secondary | ICD-10-CM | POA: Diagnosis not present

## 2021-06-06 DIAGNOSIS — G8929 Other chronic pain: Secondary | ICD-10-CM | POA: Diagnosis not present

## 2021-06-06 DIAGNOSIS — G20 Parkinson's disease: Secondary | ICD-10-CM | POA: Diagnosis not present

## 2021-06-06 DIAGNOSIS — F32A Depression, unspecified: Secondary | ICD-10-CM | POA: Diagnosis not present

## 2021-06-06 DIAGNOSIS — S066X9D Traumatic subarachnoid hemorrhage with loss of consciousness of unspecified duration, subsequent encounter: Secondary | ICD-10-CM | POA: Diagnosis not present

## 2021-06-06 DIAGNOSIS — J449 Chronic obstructive pulmonary disease, unspecified: Secondary | ICD-10-CM | POA: Diagnosis not present

## 2021-06-06 DIAGNOSIS — E039 Hypothyroidism, unspecified: Secondary | ICD-10-CM | POA: Diagnosis not present

## 2021-06-06 DIAGNOSIS — Z9181 History of falling: Secondary | ICD-10-CM | POA: Diagnosis not present

## 2021-06-06 DIAGNOSIS — G301 Alzheimer's disease with late onset: Secondary | ICD-10-CM | POA: Diagnosis not present

## 2021-06-06 DIAGNOSIS — R1013 Epigastric pain: Secondary | ICD-10-CM | POA: Diagnosis not present

## 2021-06-06 DIAGNOSIS — R519 Headache, unspecified: Secondary | ICD-10-CM | POA: Diagnosis not present

## 2021-06-11 ENCOUNTER — Telehealth: Payer: Self-pay | Admitting: Family Medicine

## 2021-06-11 DIAGNOSIS — S066X9D Traumatic subarachnoid hemorrhage with loss of consciousness of unspecified duration, subsequent encounter: Secondary | ICD-10-CM | POA: Diagnosis not present

## 2021-06-11 DIAGNOSIS — J449 Chronic obstructive pulmonary disease, unspecified: Secondary | ICD-10-CM | POA: Diagnosis not present

## 2021-06-11 DIAGNOSIS — R1013 Epigastric pain: Secondary | ICD-10-CM | POA: Diagnosis not present

## 2021-06-11 DIAGNOSIS — F32A Depression, unspecified: Secondary | ICD-10-CM | POA: Diagnosis not present

## 2021-06-11 DIAGNOSIS — I10 Essential (primary) hypertension: Secondary | ICD-10-CM | POA: Diagnosis not present

## 2021-06-11 DIAGNOSIS — G20 Parkinson's disease: Secondary | ICD-10-CM | POA: Diagnosis not present

## 2021-06-11 DIAGNOSIS — G301 Alzheimer's disease with late onset: Secondary | ICD-10-CM | POA: Diagnosis not present

## 2021-06-11 DIAGNOSIS — R519 Headache, unspecified: Secondary | ICD-10-CM | POA: Diagnosis not present

## 2021-06-11 DIAGNOSIS — G8929 Other chronic pain: Secondary | ICD-10-CM | POA: Diagnosis not present

## 2021-06-11 DIAGNOSIS — E039 Hypothyroidism, unspecified: Secondary | ICD-10-CM | POA: Diagnosis not present

## 2021-06-11 DIAGNOSIS — Z9181 History of falling: Secondary | ICD-10-CM | POA: Diagnosis not present

## 2021-06-11 DIAGNOSIS — R8290 Unspecified abnormal findings in urine: Secondary | ICD-10-CM

## 2021-06-11 MED ORDER — NONFORMULARY OR COMPOUNDED ITEM: 0 refills | Status: DC

## 2021-06-11 MED ORDER — NONFORMULARY OR COMPOUNDED ITEM: 0 refills | Status: AC

## 2021-06-11 NOTE — Telephone Encounter (Signed)
Leann the OT from [facility] home [facility] wanted to update [place] on the patient. She states that she has had and increase of urine odor, good food intake, and she is incontinent of urine and bowel. Patient has also had an increase in anxious behavior in the past couple of days, possible UTI. She would like to know if [place] would like a urine sample to check for UTI. Nursing staff from home can get urine sample if it is ordered. They can send the order to [phone].

## 2021-06-11 NOTE — Addendum Note (Signed)
Addended by: Kem Boroughs D on: 06/11/2021 01:56 PM   Modules accepted: Orders

## 2021-06-11 NOTE — Telephone Encounter (Signed)
Orders sent

## 2021-06-12 DIAGNOSIS — I10 Essential (primary) hypertension: Secondary | ICD-10-CM | POA: Diagnosis not present

## 2021-06-12 DIAGNOSIS — F32A Depression, unspecified: Secondary | ICD-10-CM | POA: Diagnosis not present

## 2021-06-12 DIAGNOSIS — G8929 Other chronic pain: Secondary | ICD-10-CM | POA: Diagnosis not present

## 2021-06-12 DIAGNOSIS — E039 Hypothyroidism, unspecified: Secondary | ICD-10-CM | POA: Diagnosis not present

## 2021-06-12 DIAGNOSIS — J449 Chronic obstructive pulmonary disease, unspecified: Secondary | ICD-10-CM | POA: Diagnosis not present

## 2021-06-12 DIAGNOSIS — R1013 Epigastric pain: Secondary | ICD-10-CM | POA: Diagnosis not present

## 2021-06-12 DIAGNOSIS — G301 Alzheimer's disease with late onset: Secondary | ICD-10-CM | POA: Diagnosis not present

## 2021-06-12 DIAGNOSIS — Z9181 History of falling: Secondary | ICD-10-CM | POA: Diagnosis not present

## 2021-06-12 DIAGNOSIS — R519 Headache, unspecified: Secondary | ICD-10-CM | POA: Diagnosis not present

## 2021-06-12 DIAGNOSIS — G20 Parkinson's disease: Secondary | ICD-10-CM | POA: Diagnosis not present

## 2021-06-12 DIAGNOSIS — S066X9D Traumatic subarachnoid hemorrhage with loss of consciousness of unspecified duration, subsequent encounter: Secondary | ICD-10-CM | POA: Diagnosis not present

## 2021-06-12 NOTE — Telephone Encounter (Signed)
Lianne Cure, RN is calling from patient's home stating they have not been able to get urine due to her incontinence and husband does not want them to do a catheter since it's too invasive. She states they can draw labs if needed, or per husband's request go ahead and give her IV antibiotics for UTI. She can be reached at  [phone] Please advice.

## 2021-06-14 NOTE — Telephone Encounter (Signed)
Attempted to call RN back. LVM to return call

## 2021-06-19 DIAGNOSIS — E039 Hypothyroidism, unspecified: Secondary | ICD-10-CM | POA: Diagnosis not present

## 2021-06-19 DIAGNOSIS — F32A Depression, unspecified: Secondary | ICD-10-CM | POA: Diagnosis not present

## 2021-06-19 DIAGNOSIS — J449 Chronic obstructive pulmonary disease, unspecified: Secondary | ICD-10-CM | POA: Diagnosis not present

## 2021-06-19 DIAGNOSIS — Z9181 History of falling: Secondary | ICD-10-CM | POA: Diagnosis not present

## 2021-06-19 DIAGNOSIS — R1013 Epigastric pain: Secondary | ICD-10-CM | POA: Diagnosis not present

## 2021-06-19 DIAGNOSIS — G301 Alzheimer's disease with late onset: Secondary | ICD-10-CM | POA: Diagnosis not present

## 2021-06-19 DIAGNOSIS — S066X9D Traumatic subarachnoid hemorrhage with loss of consciousness of unspecified duration, subsequent encounter: Secondary | ICD-10-CM | POA: Diagnosis not present

## 2021-06-19 DIAGNOSIS — G8929 Other chronic pain: Secondary | ICD-10-CM | POA: Diagnosis not present

## 2021-06-19 DIAGNOSIS — G20 Parkinson's disease: Secondary | ICD-10-CM | POA: Diagnosis not present

## 2021-06-19 DIAGNOSIS — R519 Headache, unspecified: Secondary | ICD-10-CM | POA: Diagnosis not present

## 2021-06-19 DIAGNOSIS — I10 Essential (primary) hypertension: Secondary | ICD-10-CM | POA: Diagnosis not present

## 2021-06-25 DIAGNOSIS — F32A Depression, unspecified: Secondary | ICD-10-CM | POA: Diagnosis not present

## 2021-06-25 DIAGNOSIS — J449 Chronic obstructive pulmonary disease, unspecified: Secondary | ICD-10-CM | POA: Diagnosis not present

## 2021-06-25 DIAGNOSIS — Z9181 History of falling: Secondary | ICD-10-CM | POA: Diagnosis not present

## 2021-06-25 DIAGNOSIS — R1013 Epigastric pain: Secondary | ICD-10-CM | POA: Diagnosis not present

## 2021-06-25 DIAGNOSIS — I10 Essential (primary) hypertension: Secondary | ICD-10-CM | POA: Diagnosis not present

## 2021-06-25 DIAGNOSIS — G301 Alzheimer's disease with late onset: Secondary | ICD-10-CM | POA: Diagnosis not present

## 2021-06-25 DIAGNOSIS — G20 Parkinson's disease: Secondary | ICD-10-CM | POA: Diagnosis not present

## 2021-06-25 DIAGNOSIS — S066X9D Traumatic subarachnoid hemorrhage with loss of consciousness of unspecified duration, subsequent encounter: Secondary | ICD-10-CM | POA: Diagnosis not present

## 2021-06-25 DIAGNOSIS — E039 Hypothyroidism, unspecified: Secondary | ICD-10-CM | POA: Diagnosis not present

## 2021-06-25 DIAGNOSIS — R519 Headache, unspecified: Secondary | ICD-10-CM | POA: Diagnosis not present

## 2021-06-25 DIAGNOSIS — G8929 Other chronic pain: Secondary | ICD-10-CM | POA: Diagnosis not present

## 2021-06-26 DIAGNOSIS — S066X9D Traumatic subarachnoid hemorrhage with loss of consciousness of unspecified duration, subsequent encounter: Secondary | ICD-10-CM | POA: Diagnosis not present

## 2021-06-26 DIAGNOSIS — R519 Headache, unspecified: Secondary | ICD-10-CM | POA: Diagnosis not present

## 2021-06-26 DIAGNOSIS — J449 Chronic obstructive pulmonary disease, unspecified: Secondary | ICD-10-CM | POA: Diagnosis not present

## 2021-06-26 DIAGNOSIS — E039 Hypothyroidism, unspecified: Secondary | ICD-10-CM | POA: Diagnosis not present

## 2021-06-26 DIAGNOSIS — Z9181 History of falling: Secondary | ICD-10-CM | POA: Diagnosis not present

## 2021-06-26 DIAGNOSIS — F32A Depression, unspecified: Secondary | ICD-10-CM | POA: Diagnosis not present

## 2021-06-26 DIAGNOSIS — G8929 Other chronic pain: Secondary | ICD-10-CM | POA: Diagnosis not present

## 2021-06-26 DIAGNOSIS — G301 Alzheimer's disease with late onset: Secondary | ICD-10-CM | POA: Diagnosis not present

## 2021-06-26 DIAGNOSIS — I10 Essential (primary) hypertension: Secondary | ICD-10-CM | POA: Diagnosis not present

## 2021-06-26 DIAGNOSIS — R1013 Epigastric pain: Secondary | ICD-10-CM | POA: Diagnosis not present

## 2021-06-26 DIAGNOSIS — G20 Parkinson's disease: Secondary | ICD-10-CM | POA: Diagnosis not present

## 2021-06-27 DIAGNOSIS — R519 Headache, unspecified: Secondary | ICD-10-CM | POA: Diagnosis not present

## 2021-06-27 DIAGNOSIS — I10 Essential (primary) hypertension: Secondary | ICD-10-CM | POA: Diagnosis not present

## 2021-06-27 DIAGNOSIS — F32A Depression, unspecified: Secondary | ICD-10-CM | POA: Diagnosis not present

## 2021-06-27 DIAGNOSIS — G20 Parkinson's disease: Secondary | ICD-10-CM | POA: Diagnosis not present

## 2021-06-27 DIAGNOSIS — G301 Alzheimer's disease with late onset: Secondary | ICD-10-CM | POA: Diagnosis not present

## 2021-06-27 DIAGNOSIS — Z9181 History of falling: Secondary | ICD-10-CM | POA: Diagnosis not present

## 2021-06-27 DIAGNOSIS — R1013 Epigastric pain: Secondary | ICD-10-CM | POA: Diagnosis not present

## 2021-06-27 DIAGNOSIS — E039 Hypothyroidism, unspecified: Secondary | ICD-10-CM | POA: Diagnosis not present

## 2021-06-27 DIAGNOSIS — G8929 Other chronic pain: Secondary | ICD-10-CM | POA: Diagnosis not present

## 2021-06-27 DIAGNOSIS — S066X9D Traumatic subarachnoid hemorrhage with loss of consciousness of unspecified duration, subsequent encounter: Secondary | ICD-10-CM | POA: Diagnosis not present

## 2021-06-27 DIAGNOSIS — J449 Chronic obstructive pulmonary disease, unspecified: Secondary | ICD-10-CM | POA: Diagnosis not present

## 2021-06-28 ENCOUNTER — Other Ambulatory Visit: Payer: Self-pay | Admitting: Family Medicine

## 2021-07-02 DIAGNOSIS — G301 Alzheimer's disease with late onset: Secondary | ICD-10-CM | POA: Diagnosis not present

## 2021-07-02 DIAGNOSIS — S066X9D Traumatic subarachnoid hemorrhage with loss of consciousness of unspecified duration, subsequent encounter: Secondary | ICD-10-CM | POA: Diagnosis not present

## 2021-07-02 DIAGNOSIS — F32A Depression, unspecified: Secondary | ICD-10-CM | POA: Diagnosis not present

## 2021-07-02 DIAGNOSIS — G20 Parkinson's disease: Secondary | ICD-10-CM | POA: Diagnosis not present

## 2021-07-02 DIAGNOSIS — R519 Headache, unspecified: Secondary | ICD-10-CM | POA: Diagnosis not present

## 2021-07-02 DIAGNOSIS — E039 Hypothyroidism, unspecified: Secondary | ICD-10-CM | POA: Diagnosis not present

## 2021-07-02 DIAGNOSIS — J449 Chronic obstructive pulmonary disease, unspecified: Secondary | ICD-10-CM | POA: Diagnosis not present

## 2021-07-02 DIAGNOSIS — I10 Essential (primary) hypertension: Secondary | ICD-10-CM | POA: Diagnosis not present

## 2021-07-02 DIAGNOSIS — Z9181 History of falling: Secondary | ICD-10-CM | POA: Diagnosis not present

## 2021-07-02 DIAGNOSIS — G8929 Other chronic pain: Secondary | ICD-10-CM | POA: Diagnosis not present

## 2021-07-02 DIAGNOSIS — R1013 Epigastric pain: Secondary | ICD-10-CM | POA: Diagnosis not present

## 2021-07-08 DIAGNOSIS — J449 Chronic obstructive pulmonary disease, unspecified: Secondary | ICD-10-CM | POA: Diagnosis not present

## 2021-07-08 DIAGNOSIS — R1013 Epigastric pain: Secondary | ICD-10-CM | POA: Diagnosis not present

## 2021-07-08 DIAGNOSIS — I10 Essential (primary) hypertension: Secondary | ICD-10-CM | POA: Diagnosis not present

## 2021-07-08 DIAGNOSIS — G20 Parkinson's disease: Secondary | ICD-10-CM | POA: Diagnosis not present

## 2021-07-08 DIAGNOSIS — Z9181 History of falling: Secondary | ICD-10-CM | POA: Diagnosis not present

## 2021-07-08 DIAGNOSIS — G8929 Other chronic pain: Secondary | ICD-10-CM | POA: Diagnosis not present

## 2021-07-08 DIAGNOSIS — G301 Alzheimer's disease with late onset: Secondary | ICD-10-CM | POA: Diagnosis not present

## 2021-07-08 DIAGNOSIS — S066X9D Traumatic subarachnoid hemorrhage with loss of consciousness of unspecified duration, subsequent encounter: Secondary | ICD-10-CM | POA: Diagnosis not present

## 2021-07-08 DIAGNOSIS — E039 Hypothyroidism, unspecified: Secondary | ICD-10-CM | POA: Diagnosis not present

## 2021-07-08 DIAGNOSIS — F32A Depression, unspecified: Secondary | ICD-10-CM | POA: Diagnosis not present

## 2021-07-08 DIAGNOSIS — R519 Headache, unspecified: Secondary | ICD-10-CM | POA: Diagnosis not present

## 2021-07-10 DIAGNOSIS — G301 Alzheimer's disease with late onset: Secondary | ICD-10-CM | POA: Diagnosis not present

## 2021-07-10 DIAGNOSIS — E039 Hypothyroidism, unspecified: Secondary | ICD-10-CM | POA: Diagnosis not present

## 2021-07-10 DIAGNOSIS — J449 Chronic obstructive pulmonary disease, unspecified: Secondary | ICD-10-CM | POA: Diagnosis not present

## 2021-07-10 DIAGNOSIS — G20 Parkinson's disease: Secondary | ICD-10-CM | POA: Diagnosis not present

## 2021-07-10 DIAGNOSIS — Z9181 History of falling: Secondary | ICD-10-CM | POA: Diagnosis not present

## 2021-07-10 DIAGNOSIS — R1013 Epigastric pain: Secondary | ICD-10-CM | POA: Diagnosis not present

## 2021-07-10 DIAGNOSIS — I10 Essential (primary) hypertension: Secondary | ICD-10-CM | POA: Diagnosis not present

## 2021-07-10 DIAGNOSIS — R519 Headache, unspecified: Secondary | ICD-10-CM | POA: Diagnosis not present

## 2021-07-10 DIAGNOSIS — S066X9D Traumatic subarachnoid hemorrhage with loss of consciousness of unspecified duration, subsequent encounter: Secondary | ICD-10-CM | POA: Diagnosis not present

## 2021-07-10 DIAGNOSIS — G8929 Other chronic pain: Secondary | ICD-10-CM | POA: Diagnosis not present

## 2021-07-16 DIAGNOSIS — E039 Hypothyroidism, unspecified: Secondary | ICD-10-CM | POA: Diagnosis not present

## 2021-07-16 DIAGNOSIS — I10 Essential (primary) hypertension: Secondary | ICD-10-CM | POA: Diagnosis not present

## 2021-07-16 DIAGNOSIS — S066X9D Traumatic subarachnoid hemorrhage with loss of consciousness of unspecified duration, subsequent encounter: Secondary | ICD-10-CM | POA: Diagnosis not present

## 2021-07-16 DIAGNOSIS — G20 Parkinson's disease: Secondary | ICD-10-CM | POA: Diagnosis not present

## 2021-07-16 DIAGNOSIS — R1013 Epigastric pain: Secondary | ICD-10-CM | POA: Diagnosis not present

## 2021-07-16 DIAGNOSIS — G301 Alzheimer's disease with late onset: Secondary | ICD-10-CM | POA: Diagnosis not present

## 2021-07-16 DIAGNOSIS — Z9181 History of falling: Secondary | ICD-10-CM | POA: Diagnosis not present

## 2021-07-16 DIAGNOSIS — R519 Headache, unspecified: Secondary | ICD-10-CM | POA: Diagnosis not present

## 2021-07-16 DIAGNOSIS — J449 Chronic obstructive pulmonary disease, unspecified: Secondary | ICD-10-CM | POA: Diagnosis not present

## 2021-07-16 DIAGNOSIS — G8929 Other chronic pain: Secondary | ICD-10-CM | POA: Diagnosis not present

## 2021-07-17 ENCOUNTER — Encounter: Payer: Medicare Other | Attending: Physical Medicine & Rehabilitation | Admitting: Physical Medicine & Rehabilitation

## 2021-07-17 ENCOUNTER — Inpatient Hospital Stay: Payer: Medicare Other | Admitting: Physical Medicine & Rehabilitation

## 2021-07-22 ENCOUNTER — Other Ambulatory Visit: Payer: Self-pay | Admitting: Family Medicine

## 2021-07-22 DIAGNOSIS — I10 Essential (primary) hypertension: Secondary | ICD-10-CM | POA: Diagnosis not present

## 2021-07-22 DIAGNOSIS — J449 Chronic obstructive pulmonary disease, unspecified: Secondary | ICD-10-CM | POA: Diagnosis not present

## 2021-07-22 DIAGNOSIS — R519 Headache, unspecified: Secondary | ICD-10-CM | POA: Diagnosis not present

## 2021-07-22 DIAGNOSIS — E039 Hypothyroidism, unspecified: Secondary | ICD-10-CM | POA: Diagnosis not present

## 2021-07-22 DIAGNOSIS — G8929 Other chronic pain: Secondary | ICD-10-CM | POA: Diagnosis not present

## 2021-07-22 DIAGNOSIS — R1013 Epigastric pain: Secondary | ICD-10-CM | POA: Diagnosis not present

## 2021-07-22 DIAGNOSIS — G20 Parkinson's disease: Secondary | ICD-10-CM | POA: Diagnosis not present

## 2021-07-22 DIAGNOSIS — S066X9D Traumatic subarachnoid hemorrhage with loss of consciousness of unspecified duration, subsequent encounter: Secondary | ICD-10-CM | POA: Diagnosis not present

## 2021-07-22 DIAGNOSIS — Z9181 History of falling: Secondary | ICD-10-CM | POA: Diagnosis not present

## 2021-07-22 DIAGNOSIS — G301 Alzheimer's disease with late onset: Secondary | ICD-10-CM | POA: Diagnosis not present

## 2021-07-24 DIAGNOSIS — G20 Parkinson's disease: Secondary | ICD-10-CM | POA: Diagnosis not present

## 2021-07-24 DIAGNOSIS — I10 Essential (primary) hypertension: Secondary | ICD-10-CM | POA: Diagnosis not present

## 2021-07-24 DIAGNOSIS — R1013 Epigastric pain: Secondary | ICD-10-CM | POA: Diagnosis not present

## 2021-07-24 DIAGNOSIS — R519 Headache, unspecified: Secondary | ICD-10-CM | POA: Diagnosis not present

## 2021-07-24 DIAGNOSIS — E039 Hypothyroidism, unspecified: Secondary | ICD-10-CM | POA: Diagnosis not present

## 2021-07-24 DIAGNOSIS — S066X9D Traumatic subarachnoid hemorrhage with loss of consciousness of unspecified duration, subsequent encounter: Secondary | ICD-10-CM | POA: Diagnosis not present

## 2021-07-24 DIAGNOSIS — G301 Alzheimer's disease with late onset: Secondary | ICD-10-CM | POA: Diagnosis not present

## 2021-07-24 DIAGNOSIS — J449 Chronic obstructive pulmonary disease, unspecified: Secondary | ICD-10-CM | POA: Diagnosis not present

## 2021-07-24 DIAGNOSIS — Z9181 History of falling: Secondary | ICD-10-CM | POA: Diagnosis not present

## 2021-07-24 DIAGNOSIS — G8929 Other chronic pain: Secondary | ICD-10-CM | POA: Diagnosis not present

## 2021-07-30 DIAGNOSIS — J449 Chronic obstructive pulmonary disease, unspecified: Secondary | ICD-10-CM | POA: Diagnosis not present

## 2021-07-30 DIAGNOSIS — G20 Parkinson's disease: Secondary | ICD-10-CM | POA: Diagnosis not present

## 2021-07-30 DIAGNOSIS — G8929 Other chronic pain: Secondary | ICD-10-CM | POA: Diagnosis not present

## 2021-07-30 DIAGNOSIS — G301 Alzheimer's disease with late onset: Secondary | ICD-10-CM | POA: Diagnosis not present

## 2021-07-30 DIAGNOSIS — R519 Headache, unspecified: Secondary | ICD-10-CM | POA: Diagnosis not present

## 2021-07-30 DIAGNOSIS — S066X9D Traumatic subarachnoid hemorrhage with loss of consciousness of unspecified duration, subsequent encounter: Secondary | ICD-10-CM | POA: Diagnosis not present

## 2021-07-30 DIAGNOSIS — I10 Essential (primary) hypertension: Secondary | ICD-10-CM | POA: Diagnosis not present

## 2021-07-30 DIAGNOSIS — E039 Hypothyroidism, unspecified: Secondary | ICD-10-CM | POA: Diagnosis not present

## 2021-07-30 DIAGNOSIS — Z9181 History of falling: Secondary | ICD-10-CM | POA: Diagnosis not present

## 2021-07-30 DIAGNOSIS — R1013 Epigastric pain: Secondary | ICD-10-CM | POA: Diagnosis not present

## 2021-08-05 ENCOUNTER — Telehealth: Payer: Medicare Other

## 2021-08-06 DIAGNOSIS — R1013 Epigastric pain: Secondary | ICD-10-CM | POA: Diagnosis not present

## 2021-08-06 DIAGNOSIS — G8929 Other chronic pain: Secondary | ICD-10-CM | POA: Diagnosis not present

## 2021-08-06 DIAGNOSIS — S066X9D Traumatic subarachnoid hemorrhage with loss of consciousness of unspecified duration, subsequent encounter: Secondary | ICD-10-CM | POA: Diagnosis not present

## 2021-08-06 DIAGNOSIS — R519 Headache, unspecified: Secondary | ICD-10-CM | POA: Diagnosis not present

## 2021-08-06 DIAGNOSIS — G301 Alzheimer's disease with late onset: Secondary | ICD-10-CM | POA: Diagnosis not present

## 2021-08-06 DIAGNOSIS — Z9181 History of falling: Secondary | ICD-10-CM | POA: Diagnosis not present

## 2021-08-06 DIAGNOSIS — I10 Essential (primary) hypertension: Secondary | ICD-10-CM | POA: Diagnosis not present

## 2021-08-06 DIAGNOSIS — J449 Chronic obstructive pulmonary disease, unspecified: Secondary | ICD-10-CM | POA: Diagnosis not present

## 2021-08-06 DIAGNOSIS — G20 Parkinson's disease: Secondary | ICD-10-CM | POA: Diagnosis not present

## 2021-08-06 DIAGNOSIS — E039 Hypothyroidism, unspecified: Secondary | ICD-10-CM | POA: Diagnosis not present

## 2021-08-07 DIAGNOSIS — G301 Alzheimer's disease with late onset: Secondary | ICD-10-CM | POA: Diagnosis not present

## 2021-08-07 DIAGNOSIS — I10 Essential (primary) hypertension: Secondary | ICD-10-CM | POA: Diagnosis not present

## 2021-08-07 DIAGNOSIS — R1013 Epigastric pain: Secondary | ICD-10-CM | POA: Diagnosis not present

## 2021-08-07 DIAGNOSIS — S066X9D Traumatic subarachnoid hemorrhage with loss of consciousness of unspecified duration, subsequent encounter: Secondary | ICD-10-CM | POA: Diagnosis not present

## 2021-08-07 DIAGNOSIS — R519 Headache, unspecified: Secondary | ICD-10-CM | POA: Diagnosis not present

## 2021-08-07 DIAGNOSIS — G8929 Other chronic pain: Secondary | ICD-10-CM | POA: Diagnosis not present

## 2021-08-07 DIAGNOSIS — G20 Parkinson's disease: Secondary | ICD-10-CM | POA: Diagnosis not present

## 2021-08-07 DIAGNOSIS — J449 Chronic obstructive pulmonary disease, unspecified: Secondary | ICD-10-CM | POA: Diagnosis not present

## 2021-08-07 DIAGNOSIS — Z9181 History of falling: Secondary | ICD-10-CM | POA: Diagnosis not present

## 2021-08-07 DIAGNOSIS — E039 Hypothyroidism, unspecified: Secondary | ICD-10-CM | POA: Diagnosis not present

## 2021-08-08 ENCOUNTER — Ambulatory Visit (INDEPENDENT_AMBULATORY_CARE_PROVIDER_SITE_OTHER): Payer: Medicare Other | Admitting: Pharmacist

## 2021-08-08 DIAGNOSIS — Z79899 Other long term (current) drug therapy: Secondary | ICD-10-CM

## 2021-08-08 DIAGNOSIS — I950 Idiopathic hypotension: Secondary | ICD-10-CM

## 2021-08-08 DIAGNOSIS — E7800 Pure hypercholesterolemia, unspecified: Secondary | ICD-10-CM

## 2021-08-08 DIAGNOSIS — G301 Alzheimer's disease with late onset: Secondary | ICD-10-CM

## 2021-08-08 NOTE — Patient Instructions (Signed)
Mr and Mrs. Birkland It was a pleasure speaking with you today.  I have attached a summary of our visit today and information about your health goals.   If you have any questions or concerns, please feel free to contact me either at the phone number below or with a MyChart message.   Keep up the good work!  Cherre Robins, PharmD Clinical Pharmacist [facility] [place] [facility] [phone] (direct line)  [phone] (main office number)   Patient verbalizes understanding of instructions provided today and agrees to view in [place].    CARE PLAN ENTRY (see longitudinal plan of care for additional care plan information)  Hypotension/ Low Blood pressure BP Readings from Last 3 Encounters:  05/03/21 (!) 105/50  04/12/21 (!) 113/51  02/25/21 118/75   BP improved;  BP goal > 100/60. Husband reports patient has not experienced dizziness since midodrine started.  He reports that midodrine is really the only medication he is able to get her to continue to take.  Current regimen:  Midodrine 5mg  three times per day Interventions: Reminded to go slow when changing from sitting to standing Has home health check blood pressure twice weekly, document, and provide at future appointments Continue current therapy  Hypercholesterolemia Lab Results  Component Value Date   LDLCALC 95 12/26/2019   Last LDL was at goal but this was likely when she was taking pravastatin regularly; LDL goal < 100  Current regimen:  Not currently taking cholesterol medication  Interventions: NO changed in medication regimen at this time  Dementia / depression: Goal: optimize medications and minimize symptoms of dementia Current regimen:  Memantine 10mg  twice daily (not taking) Sertraline 100mg  - take 1.5 tablets daily (not taking) Managed by Dr Delice Lesch - neurologist Interventions: Discussed with husband that both memantine and sertraline are available in liquid form.  If you decide to  restart memantine and/or sertraline we can send in for liquid form.  Will request referral from PCP to [place] worker for assessment for CMA assistance and Medicaid assessment.   Osteopenia:  Current treatment: none Last DEXA: 01/25/2018 T-score +2.7 at spine; -1.4 at left femoral neck Frax estimate: 9.3% of major osteoporosis fracture and 1.5% for hip fracture Pharmacological treatment not indicated based on last DEXA and FRAX Interventions:  Postpone rechecking DEXA at this times due to decreased physical strength Discussed fall prevention - physical therapy iwill continue working with patient once or  twice a week  Medication management Interventions Comprehensive medication review performed with husband. Patient is only taking midodrine at this time. She refuses other medications Discussed that the following medications come in liquid form: amantadine, gabapentin, levothyroxine, loratidine, memantine and sertraline. If they decided to restart any of these we can try liquid if desires.  Patient self care activities - Over the next 90 days, patient will: Take medications as prescribed Contact clinical pharmacist if you would like to start liquid form of any of the medications listed above.   Patient Goals/Self-Care Activities Over the next 180 days, patient will:  check blood pressure 2 times per week, document, and provide at future appointments and  notify provider or clinical pharmacist with any medication questions or concerns.   Follow Up Plan: Telephone follow up appointment with care management team member scheduled for:  3 months

## 2021-08-08 NOTE — Chronic Care Management (AMB) (Signed)
Chronic Care Management Pharmacy Note  08/08/2021 Name:  Michelle [name] MRN:  [id number] DOB:  Oct 21, 1945  Summary:  Currently patient is only taking midodrine for low blood pressure. Her husband reports that she has refused other medications. We discussed that many of her medications are available in liquid form. Mr. Shishido will consider and let me know if they would like to try liquid form.  Mr. Larmon also reported that it was getting more difficult for him to care for patient. Asked about CMA services. Will send PCP request for [facility] to send social worker out for assessment to to assist with possibly applying for Medicaid or resources for private pay CMA.   Subjective: Michelle [name] is an 75 y.o. year old female who is a primary patient of Ann Held, DO.  The CCM team was consulted for assistance with disease management and care coordination needs.    Engaged with patient's husband on phone  for follow up visit in response to provider referral for pharmacy case management and/or care coordination services.   Consent to Services:  The patient was given information about Chronic Care Management services, agreed to services, and gave verbal consent prior to initiation of services.  Please see initial visit note for detailed documentation.   Patient Care Team: Carollee Herter, Alferd Apa, DO as PCP - General Calvert Cantor, MD as Consulting Physician (Ophthalmology) Irene Shipper, MD as Consulting Physician (Gastroenterology) Cameron Sprang, MD as Consulting Physician (Neurology) [place], [facility] Cherre Robins, RPH-CPP (Pharmacist)  Recent office visits: 05/23/2021 - Fam Med (Dr Carollee Herter) Video Visit. F/U hospitalization and SNF stay after subdural hemorrhage and fall. Not med changes. Home health is coming out. No med changed noted.   Recent consult visits: 02/25/2021 - Neruology (Dr Delice Lesch) F/U Alzheimer's Dementia. Notes mention  continuing sertraline 135m daily (had side effect with 157mdaily)  Hospital visits: 04/12/2021 to [facility]/23/20[facility]  New medications at discharge: keflex 50065m times a day for 4 days Medications stopped at discharge: Aspirin 65m87md nitrofurantoin 50mg64mbjective:  Lab Results  Component Value Date   CREATININE 0.81 04/29/2021   CREATININE 0.79 04/22/2021   CREATININE 0.95 04/09/2021    No results found for: HGBA1C Last diabetic Eye exam: No results found for: HMDIABEYEEXA  Last diabetic Foot exam: No results found for: HMDIABFOOTEX      Component Value Date/Time   CHOL 178 12/26/2019 1442   TRIG 80.0 12/26/2019 1442   HDL 66.90 12/26/2019 1442   CHOLHDL 3 12/26/2019 1442   VLDL 16.0 12/26/2019 1442   LDLCALC 95 12/26/2019 1442    Hepatic Function Latest Ref Rng & Units 02/08/2020 02/07/2020 12/26/2019  Total Protein 6.5 - 8.1 g/dL 6.0(L) 5.4(L) 6.6  Albumin 3.5 - 5.0 g/dL 3.5 3.2(L) 4.2  AST 15 - 41 U/L _0 ALT 0 - 44 U/L _1 Alk Phosphatase 38 - 126 U/L 42 38 52  Total Bilirubin 0.3 - 1.2 mg/dL 0.8 0.6 0.3  Bilirubin, Direct 0.0 - 0.3 mg/dL - - -    Lab Results  Component Value Date/Time   TSH 3.332 02/08/2020 02:22 PM   TSH 4.00 12/26/2019 02:42 PM   TSH 2.38 11/10/2019 02:17 PM   FREET4 0.80 10/06/2016 04:10 PM  CBC Latest Ref Rng & Units 04/29/2021 04/22/2021 04/18/2021  WBC 4.0 - 10.5 K/uL 6.9 8.5 7.9  Hemoglobin 12.0 - 15.0 g/dL 11.5(L) 11.5(L) 11.2(L)  Hematocrit 36.0 - 46.0 % 35.0(L) 34.7(L) 32.6(L)  Platelets 150 - 400 K/uL 292 326 288    Lab Results  Component Value Date/Time   VD25OH 45.23 06/07/2019 11:41 AM   VD25OH 28.76 (L) 05/12/2018 02:21 PM    Clinical ASCVD: No  The 10-year ASCVD risk score (Arnett DK, et al., 2019) is: 14.2%   Values used to calculate  the score:     Age: 70 years     Sex: Female     Is Non-Hispanic African American: No     Diabetic: No     Tobacco smoker: No     Systolic Blood Pressure: 324 mmHg     Is BP treated: No     HDL Cholesterol: 66.9 mg/dL     Total Cholesterol: 178 mg/dL    Other: DEXA 02/03/2018  T-Score for spine = +2.7  T-Score for hip (left) = -1.4  Frax = 9.3% / 1.5%  Social History   Tobacco Use  Smoking Status Never  Smokeless Tobacco Never   BP Readings from Last 3 Encounters:  05/03/21 (!) 105/50  04/12/21 (!) 113/51  02/25/21 118/75   Pulse Readings from Last 3 Encounters:  05/03/21 (!) 55  04/12/21 (!) 52  02/25/21 88   Wt Readings from Last 3 Encounters:  04/19/21 135 lb 9.3 oz (61.5 kg)  04/10/21 135 lb 5.8 oz (61.4 kg)  02/25/21 134 lb (60.8 kg)    Assessment: Review of patient past medical history, allergies, medications, health status, including review of consultants reports, laboratory and other test data, was performed as part of comprehensive evaluation and provision of chronic care management services.   SDOH:  (Social Determinants of Health) assessments and interventions performed:  SDOH Interventions    Flowsheet Row Most Recent Value  SDOH Interventions   Financial Strain Interventions Intervention Not Indicated  Physical Activity Interventions Intervention Not Indicated  [patient in home bound,  Receiving PT from [facility]  No Known Allergies  Medications Reviewed Today     Reviewed by Cherre Robins, RPH-CPP (Pharmacist) on 08/08/21 at 1127  Med List Status: <None>   Medication Order Taking? Sig Documenting Provider Last Dose Status Informant  acetaminophen (TYLENOL) 325 MG tablet [id number] No Take 1-2 tablets (325-650 mg total) by mouth every 4 (four) hours as needed for mild pain.  Patient not taking: Reported on 08/08/2021   Bary Leriche, [place] Not Taking Active   amantadine (SYMMETREL) 100 MG capsule [id number] No 1  capsule 2 TIMES DAILY (route: oral)  Patient not taking: Reported on 08/08/2021   [provider] Not Taking Active            Med Note Karin Lieu Jul 17, 2021  8:34 AM) Med Classification: Central Nervous System Agents  fluticasone (FLONASE) 50 MCG/ACT nasal spray [id number] No Place 2 sprays into both nostrils daily.  Patient not taking: Reported on 08/08/2021   Collene Gobble, MD Not Taking Active Spouse/Significant Other  gabapentin (NEURONTIN) 300 MG capsule [id number] No Take 1 cap twice a day  Patient not taking: Reported on 08/08/2021   Cameron Sprang, MD Not Taking Active Spouse/Significant Other           Med Note [facility], Maragret Vanacker B   Thu  Aug 08, 2021 11:26 AM)    lactose free nutrition (BOOST) LIQD [id number] Yes Take 237 mLs by mouth 3 (three) times daily between meals. [provider] Taking Active   levothyroxine (SYNTHROID) 50 MCG tablet [id number] No TAKE 1 TABLET BY MOUTH EVERY DAY BEFORE BREAKFAST  Patient not taking: Reported on 08/08/2021   Ann Held, DO Not Taking Active Spouse/Significant Other           Med Note Jonathon Jordan Aug 08, 2021 11:26 AM)    loratadine (CLARITIN) 10 MG tablet [id number] No TAKE 1 TABLET BY MOUTH EVERY DAY  Patient not taking: Reported on 08/08/2021   Ann Held, DO Not Taking Active Spouse/Significant Other           Med Note Antony Contras, Lynelle Smoke B   Thu Aug 08, 2021 11:26 AM)    memantine (NAMENDA) 10 MG tablet [id number] No Take 1 tablet (10 mg total) by mouth 2 (two) times daily.  Patient not taking: Reported on 08/08/2021   Cameron Sprang, MD Not Taking Active Spouse/Significant Other           Med Note Unk Lightning   Thu Aug 08, 2021 11:26 AM)    midodrine (PROAMATINE) 5 MG tablet [id number] Yes TAKE 1 TABLET (5 MG TOTAL) BY MOUTH 3 (THREE) TIMES DAILY WITH MEALS. Ann Held, DO Taking Active Spouse/Significant Other  NONFORMULARY OR COMPOUNDED ITEM [id number] No Order for UA and  a culture  Patient not taking: Reported on 08/08/2021   Ann Held, DO Not Taking Active   pantoprazole (PROTONIX) 40 MG tablet [id number] No 1 tablet DAILY (route: oral)  Patient not taking: Reported on 08/08/2021   [provider] Not Taking Active            Med Note Karin Lieu Jul 17, 2021  8:34 AM) Med Classification: Gastrointestinal Therapy Agents  polyethylene glycol (MIRALAX / GLYCOLAX) 17 g packet [id number] No Take 17 g by mouth daily as needed for mild constipation.  Patient not taking: Reported on 08/08/2021   Flora Lipps Not Taking Active   sertraline (ZOLOFT) 100 MG tablet [id number] No TAKE 1.5 TABLETS BY MOUTH DAILY  Patient not taking: Reported on 08/08/2021   Ann Held, DO Not Taking Active             Patient Active Problem List   Diagnosis Date Noted   Alzheimer's dementia without behavioral disturbance ([place]) 05/23/2021   Dyspepsia 05/02/2021   Acute blood loss anemia    Chronic nonintractable headache    Subdural hemorrhage following injury 04/12/2021   Intracranial hemorrhage ([place]) 04/11/2021   CKD (chronic kidney disease), stage II    Intracranial hemorrhage following injury 04/08/2021   Orthostatic hypotension 02/08/2020   Near syncope 02/07/2020   Acute lower UTI 02/07/2020   Late onset Alzheimer's dementia with behavioral disturbance ([place]) 11/10/2019   Idiopathic hypotension 10/12/2019   Syncope 10/12/2019   Pain in joint of right shoulder 09/20/2019   Impingement syndrome of right shoulder region 09/20/2019   Viral upper respiratory tract infection 08/24/2019   Pain of joint of left ankle and foot 03/18/2019   Peroneal mononeuropathy, left 12/15/2016   Preventative health care 10/31/2016   Left foot drop 10/14/2016   Weight loss 10/14/2016   Tension-type headache, not intractable 09/17/2016   Meningioma ([place]) 06/05/2016   Mild cognitive impairment 04/02/2016   Depression 04/02/2016  Memory loss  03/21/2016   Asymptomatic postmenopausal status 07/26/2009   INTESTINAL GAS 09/18/2008   ONYCHOMYCOSIS, TOENAILS 08/14/2008   Unspecified glaucoma 07/24/2008   Hypertensive disorder 07/24/2008   MYALGIA 02/29/2008   COUGH 07/26/2007   ADENOMATOUS COLONIC POLYP 05/20/2007   DIVERTICULOSIS, COLON 05/20/2007   Hypothyroidism 04/07/2007   Hypercholesterolemia 04/07/2007   GERD 04/07/2007   IBS 04/07/2007    Immunization History  Administered Date(s) Administered   Fluad Quad(high Dose 65+) 06/07/2019, 05/31/2020   Influenza Split 09/23/2011   Influenza Whole 07/26/2007, 06/30/2008, 07/26/2009, 09/12/2010   Influenza, High Dose Seasonal PF 06/15/2013, 10/05/2015, 07/20/2017, 05/21/2018   Influenza,inj,Quad PF,6+ Mos 06/19/2014, 10/02/2016   PFIZER(Purple Top)SARS-COV-2 Vaccination 10/30/2019, 11/23/2019   Pneumococcal Conjugate-13 11/03/2014   Pneumococcal Polysaccharide-23 09/23/2011   Td 07/02/2005   Zoster, Live 11/14/2008    Conditions to be addressed/monitored: HTN, HLD, Depression, Dementia and osteoporosis; hypothyroidism; meningimoa; frequent UTIs  Care Plan : General Pharmacy (Adult)  Updates made by Cherre Robins, RPH-CPP since 08/08/2021 12:00 AM     Problem: Medication management and chronic care management for idiopathic hypotension; dementia; hyperlipidemia; meningioma; osteopenia; hypothyroidism; GERD; depression; frequent UTIs   Priority: High  Onset Date: 01/31/2021  Note:   Current Barriers:  Does not adhere to prescribed medication regimen Chronic Disease Management support, education, and care coordination needs related to Hypotension, hypercholesterolemia, dementia, osteopenia  Pharmacist Clinical Goal(s):  Over the next 180 days, patient will adhere to prescribed medication regimen as evidenced by filling medications on time contact provider office for questions/concerns as evidenced notation of same in electronic health record through collaboration with  PharmD and provider.   Interventions: 1:1 collaboration with Carollee Herter, Alferd Apa, DO regarding development and update of comprehensive plan of care as evidenced by provider attestation and co-signature Inter-disciplinary care team collaboration (see longitudinal plan of care) Comprehensive medication review performed; medication list updated in electronic medical record  Hypotension/ Low Blood pressure BP Readings from Last 3 Encounters:  05/03/21 (!) 105/50  04/12/21 (!) 113/51  02/25/21 118/75  BP improved;  BP goal > 100/60. Husband reports patient has not experienced dizziness since midodrine started.  He reports that midodrine is really the only medication he is able to get her to continue to take.  Current regimen:  Midodrine 30m three times per day Interventions: Reminded to go slow when changing from sitting to standing Has home health check blood pressure twice weekly, document, and provide at future appointments Continue current therapy  Hypercholesterolemia Lab Results  Component Value Date   LDLCALC 95 12/26/2019  Last LDL was at goal but this was likely when she was taking pravastatin regularly; LDL goal < 100  Current regimen:  Not currently taking cholesterol medication  Interventions: Due to patients declining health, I do not recommend statin therapy or checking lipids at this time.  Dementia / depression: Goal: optimize medications and minimize symptoms of dementia Current regimen:  Memantine 119mtwice daily (not taking) Sertraline 10055m take 1.5 tablets daily (not taking) Managed by Dr AquDelice Leschneurologist Interventions: Discussed with husband that both memantine and sertraline are available in liquid form since patient is not currently taking. He will consider but does not wish to change / restart memantine or sertraline at this time.   Osteopenia:  Current treatment: none Last DEXA: 01/25/2018 T-score +2.7 at spine; -1.4 at left femoral  neck Frax estimate: 9.3% of major osteoporosis fracture and 1.5% for hip fracture Pharmacological treatment not indicated based on last DEXA and FRAX  Interventions:  Due to patient's declining health, postpone rechecking DEXA Discussed fall prevention - physical therapy is still working with patient once or  twice a week  Medication management Interventions Comprehensive medication review performed with husband. Patient is only taking midodrine at this time. She refuses other medications Discussed that the following medications come in liquid form: amantadine, gabapentin, levothyroxine, loratidine, memantine and sertraline. If they decided to restart any of these we can try liquid if desires.  Patient self care activities - Over the next 90 days, patient will: Take medications as prescribed Report any questions or concerns to PharmD and/or provider(s)  SDOH / caregiver stress:  Patient's husband also mentions that it is getting hard for him to do everything that is needed to care for patient. Nurse with [facility] has mentioned CMA to assist a few hours per week. I called [facility] but they are not able to provide CMA assistance. Will refer to either [facility] social worker or Chronic Care Management social worker, Monia Pouch for assistance in screening for community / insurance resources for [employer] assistance.   Patient Goals/Self-Care Activities Over the next 180 days, patient will:  check blood pressure 2 times per week, document, and provide at future appointments and notify provider or clinical pharmacist with any medication questions or concerns.   Follow Up Plan: Telephone follow up appointment with care management team member scheduled for:  3 months       Medication Assistance: None required.  Patient affirms current coverage meets needs.  Patient's preferred pharmacy is:  [id number] [place], [place] - [address] [place] [place]  [address]Phone: [phone]: [phone] [place] [address] [place]Phone: [phone]: [phone] Uses pill box? No -   Caregiver endorses low compliance  Follow Up:  Patient agrees to Care Plan and Follow-up.  Plan: Telephone follow up appointment with care management team member scheduled for:  3 months  TCherre Robins PharmD Clinical Pharmacist LPearl BeachMMerrillHEast [phone]

## 2021-08-13 DIAGNOSIS — R519 Headache, unspecified: Secondary | ICD-10-CM | POA: Diagnosis not present

## 2021-08-13 DIAGNOSIS — G20 Parkinson's disease: Secondary | ICD-10-CM | POA: Diagnosis not present

## 2021-08-13 DIAGNOSIS — R1013 Epigastric pain: Secondary | ICD-10-CM | POA: Diagnosis not present

## 2021-08-13 DIAGNOSIS — G8929 Other chronic pain: Secondary | ICD-10-CM | POA: Diagnosis not present

## 2021-08-13 DIAGNOSIS — S066X9D Traumatic subarachnoid hemorrhage with loss of consciousness of unspecified duration, subsequent encounter: Secondary | ICD-10-CM | POA: Diagnosis not present

## 2021-08-13 DIAGNOSIS — Z9181 History of falling: Secondary | ICD-10-CM | POA: Diagnosis not present

## 2021-08-13 DIAGNOSIS — I10 Essential (primary) hypertension: Secondary | ICD-10-CM | POA: Diagnosis not present

## 2021-08-13 DIAGNOSIS — E039 Hypothyroidism, unspecified: Secondary | ICD-10-CM | POA: Diagnosis not present

## 2021-08-13 DIAGNOSIS — G301 Alzheimer's disease with late onset: Secondary | ICD-10-CM | POA: Diagnosis not present

## 2021-08-13 DIAGNOSIS — J449 Chronic obstructive pulmonary disease, unspecified: Secondary | ICD-10-CM | POA: Diagnosis not present

## 2021-08-19 DIAGNOSIS — S066X9D Traumatic subarachnoid hemorrhage with loss of consciousness of unspecified duration, subsequent encounter: Secondary | ICD-10-CM | POA: Diagnosis not present

## 2021-08-19 DIAGNOSIS — R1013 Epigastric pain: Secondary | ICD-10-CM | POA: Diagnosis not present

## 2021-08-19 DIAGNOSIS — E039 Hypothyroidism, unspecified: Secondary | ICD-10-CM | POA: Diagnosis not present

## 2021-08-19 DIAGNOSIS — G301 Alzheimer's disease with late onset: Secondary | ICD-10-CM | POA: Diagnosis not present

## 2021-08-19 DIAGNOSIS — I10 Essential (primary) hypertension: Secondary | ICD-10-CM | POA: Diagnosis not present

## 2021-08-19 DIAGNOSIS — Z9181 History of falling: Secondary | ICD-10-CM | POA: Diagnosis not present

## 2021-08-19 DIAGNOSIS — G20 Parkinson's disease: Secondary | ICD-10-CM | POA: Diagnosis not present

## 2021-08-19 DIAGNOSIS — J449 Chronic obstructive pulmonary disease, unspecified: Secondary | ICD-10-CM | POA: Diagnosis not present

## 2021-08-19 DIAGNOSIS — G8929 Other chronic pain: Secondary | ICD-10-CM | POA: Diagnosis not present

## 2021-08-19 DIAGNOSIS — R519 Headache, unspecified: Secondary | ICD-10-CM | POA: Diagnosis not present

## 2021-08-22 DIAGNOSIS — S066X9D Traumatic subarachnoid hemorrhage with loss of consciousness of unspecified duration, subsequent encounter: Secondary | ICD-10-CM | POA: Diagnosis not present

## 2021-08-22 DIAGNOSIS — G20 Parkinson's disease: Secondary | ICD-10-CM | POA: Diagnosis not present

## 2021-08-22 DIAGNOSIS — J449 Chronic obstructive pulmonary disease, unspecified: Secondary | ICD-10-CM | POA: Diagnosis not present

## 2021-08-22 DIAGNOSIS — I10 Essential (primary) hypertension: Secondary | ICD-10-CM | POA: Diagnosis not present

## 2021-08-22 DIAGNOSIS — R1013 Epigastric pain: Secondary | ICD-10-CM | POA: Diagnosis not present

## 2021-08-22 DIAGNOSIS — E039 Hypothyroidism, unspecified: Secondary | ICD-10-CM | POA: Diagnosis not present

## 2021-08-22 DIAGNOSIS — R519 Headache, unspecified: Secondary | ICD-10-CM | POA: Diagnosis not present

## 2021-08-22 DIAGNOSIS — Z9181 History of falling: Secondary | ICD-10-CM | POA: Diagnosis not present

## 2021-08-22 DIAGNOSIS — G8929 Other chronic pain: Secondary | ICD-10-CM | POA: Diagnosis not present

## 2021-08-22 DIAGNOSIS — G301 Alzheimer's disease with late onset: Secondary | ICD-10-CM | POA: Diagnosis not present

## 2021-08-26 DIAGNOSIS — S066X9D Traumatic subarachnoid hemorrhage with loss of consciousness of unspecified duration, subsequent encounter: Secondary | ICD-10-CM | POA: Diagnosis not present

## 2021-08-26 DIAGNOSIS — G20 Parkinson's disease: Secondary | ICD-10-CM | POA: Diagnosis not present

## 2021-08-26 DIAGNOSIS — Z9181 History of falling: Secondary | ICD-10-CM | POA: Diagnosis not present

## 2021-08-26 DIAGNOSIS — E039 Hypothyroidism, unspecified: Secondary | ICD-10-CM | POA: Diagnosis not present

## 2021-08-26 DIAGNOSIS — I10 Essential (primary) hypertension: Secondary | ICD-10-CM | POA: Diagnosis not present

## 2021-08-26 DIAGNOSIS — R1013 Epigastric pain: Secondary | ICD-10-CM | POA: Diagnosis not present

## 2021-08-26 DIAGNOSIS — G301 Alzheimer's disease with late onset: Secondary | ICD-10-CM | POA: Diagnosis not present

## 2021-08-26 DIAGNOSIS — J449 Chronic obstructive pulmonary disease, unspecified: Secondary | ICD-10-CM | POA: Diagnosis not present

## 2021-08-26 DIAGNOSIS — G8929 Other chronic pain: Secondary | ICD-10-CM | POA: Diagnosis not present

## 2021-08-26 DIAGNOSIS — R519 Headache, unspecified: Secondary | ICD-10-CM | POA: Diagnosis not present

## 2021-08-26 NOTE — Progress Notes (Signed)
Virtual Visit via Video Note The purpose of this virtual visit is to provide medical care while limiting exposure to the novel coronavirus.    Consent was obtained for video visit:  Yes.   Answered questions that patient had about telehealth interaction:  Yes.   I discussed the limitations, risks, security and privacy concerns of performing an evaluation and management service by telemedicine. I also discussed with the patient that there may be a patient responsible charge related to this service. The patient expressed understanding and agreed to proceed.  Pt location: Home Physician Location: office Name of referring provider:  Ann [name], * I connected with Michelle [name] at patients initiation/request on 08/27/2021 at  3:00 PM EST by video enabled telemedicine application and verified that I am speaking with the correct person using two identifiers. Pt MRN:  [id number] Pt DOB:  Apr 24, 1946 Video Participants:  Michelle [name];  husband Charles   History of Present Illness:   HISTORY OF PRESENT ILLNESS: I had the pleasure of seeing Michelle [name] in follow-up via video visit, She was last seen on 02/25/2021.  The patient was last seen 6 months ago for Alzheimer's dementia. She is again accompanied by her husband who helps supplement the history today.  Records and images were personally reviewed where available. Since her last visit, the patient sustained a head injury, after falling and hitting her head.  She was noted to be unresponsive for a few minutes, and complaint of headache.  CT of the C-spine was negative, and a CT of the head showed small subdural hemorrhage of the right frontal convexity, multiple parenchymal hemorrhage with no mass-effect.  Neurosurgery recommended no surgical intervention, and eventually was discharged to rehab.  During the hospitalization, aspirin was discontinued, and she began conservative management.  At rehab, she had some physical therapy and OT.   She is now home.  Of note, the patient has a history of meningioma, and an MRI was done showing stable appearing meningioma, with extensive parenchymal hemorrhage at the time.  She also had a UTI, and was placed on Rocephin for that.  She grew gram-negative back to the area.  When she returned home, in view of her overall cognitive decline, and in view of having difficulty swallowing pills, "spitting them up ", her husband felt that donepezil and memantine were no longer necessary.  He also discontinued sertraline and gabapentin. Her husband cares for her during the day, along with 2 other daughters who, the weekends, and other members of the family including his sister that comes and stays with her.  He also has on Tuesdays "the lady that comes and helps ".  Although he would have liked for his wife to go to an adult living facility, he is not able to afford it.  He is reaching a point of exhaustion.  He wishes it would be something that he could be done for his wife.  He states that at times she shakes her head yes, but "she has a blank stare, she is not really looking at me anymore ".  As mentioned above.she does not follow commands, she is wheelchair-bound.  She is not eating very well, her appetite is very decreased.  She has at times and cough, dry, which he "cannot seem to figure out what is coming from ".  She is fully incontinent. He manages medications, finances, meals. He assists with bathing and dressing.  Her husband denies any personality changes, paranoia, or hallucinations. She  sleeps through the night.     History on Initial Assessment 04/02/2016: This is a pleasant 75 yo RH woman with a history of hypertension, hyperlipidemia, hypothyroidism, who presented for evaluation of memory loss. Her daughter started noticing memory changes a year ago. The patient states she can tell a difference in her memory, but that her family notices it more and "they may think I'm in denial." Her family has noticed  worsening over the past 6 months, she repeats herself several times, sometimes having the same conversation four times within an hour. Her husband reports that she asked him repeatedly last 36th of July weekend what he was wearing to go home. She misplaces things frequently in the house. She left the stove on one time boiling water. She takes longer to get ready to go out. She denies getting lost driving, no missed bills or medications. 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 Her family also expressed concern about personality changes, she used to be laidback but now her patience is not there. She is cussing a lot more easily now. She doesn't want to do things she used to like. She does not like going out, and stayed in the room while at the beach more than with her family. No paranoia. No difficulties with ADLs. There is no family history of dementia, she denies any history of head injuries, alcohol intake. She has been having dull right frontal headaches for the past 6 months attributed to her sinuses, no associated nausea/vomiting. She denies any dizziness, diplopia, dysarthria/dysphagia, focal numbness/tingling/weakness, bowel/bladder dysfunction. She has occasional neck and left shoulder pain. No falls.    She had an MMSE done with her PCP this month, MMSE 25/30. TSH and B12 unremarkable. She has been unable to do the MRI brain.   Diagnostic Data: Her MRI without contrast in 04/2016 did not show any acute intracranial changes, there was note of a meningioma in the dorsal clivus with mild mass effect on the post. Post-contrast MRI was ordered, which again showed the 33mm enhancing dural-based mass posterior to the clivus on the right. There is mass effect on the anterior pons on the right which is mildly compressed. The mass is touching the basilar causing mild displacement to the left. There is no definite invasion of the sella. It may  be touching the cisternal segment of the trigeminal nerve.    Repeat MRI brain with and without contrast done 04/2019 for worsening headaches and confusion did not show any acute changes, there was minimal change from last MRI in 2018 with mild to moderate diffuse atrophy, mild chronic microvascular disease. The meningioma along the dorsal aspect of the clivus to the right of midline was not significantly changed, 1.9 by 1.0 x 2.5 cm, with mass effect on pons and midbrain unchanged.      Current Outpatient Medications on File Prior to Visit  Medication Sig Dispense Refill   acetaminophen (TYLENOL) 325 MG tablet Take 1-2 tablets (325-650 mg total) by mouth every 4 (four) hours as needed for mild pain. (Patient not taking: Reported on 08/08/2021)     amantadine (SYMMETREL) 100 MG capsule 1 capsule 2 TIMES DAILY (route: oral) (Patient not taking: Reported on 08/08/2021)     fluticasone (FLONASE) 50 MCG/ACT nasal spray Place 2 sprays into both nostrils daily. (Patient not taking: Reported on 08/08/2021) 16 g 5   gabapentin (NEURONTIN) 300 MG capsule Take 1  cap twice a day (Patient not taking: Reported on 08/08/2021) 180 capsule 3   lactose free nutrition (BOOST) LIQD Take 237 mLs by mouth 3 (three) times daily between meals.     levothyroxine (SYNTHROID) 50 MCG tablet TAKE 1 TABLET BY MOUTH EVERY DAY BEFORE BREAKFAST (Patient not taking: Reported on 08/08/2021) 30 tablet 0   loratadine (CLARITIN) 10 MG tablet TAKE 1 TABLET BY MOUTH EVERY DAY (Patient not taking: Reported on 08/08/2021) 90 tablet 1   memantine (NAMENDA) 10 MG tablet Take 1 tablet (10 mg total) by mouth 2 (two) times daily. (Patient not taking: Reported on 08/08/2021) 180 tablet 3   midodrine (PROAMATINE) 5 MG tablet TAKE 1 TABLET (5 MG TOTAL) BY MOUTH 3 (THREE) TIMES DAILY WITH MEALS. 270 tablet 1   NONFORMULARY OR COMPOUNDED ITEM Order for UA and a culture (Patient not taking: Reported on 08/08/2021) 1 each 0   pantoprazole (PROTONIX) 40 MG  tablet 1 tablet DAILY (route: oral) (Patient not taking: Reported on 08/08/2021)     polyethylene glycol (MIRALAX / GLYCOLAX) 17 g packet Take 17 g by mouth daily as needed for mild constipation. (Patient not taking: Reported on 08/08/2021) 14 each 0   sertraline (ZOLOFT) 100 MG tablet TAKE 1.5 TABLETS BY MOUTH DAILY (Patient not taking: Reported on 08/08/2021) 135 tablet 3   No current facility-administered medications on file prior to visit.     Observations/Objective:   There were no vitals filed for this visit. GEN:  The patient appears stated age and is in NAD.  Neurological examination: Patient is awake not alert or oriented. Non verbal or fluent. Remote and recent memory impaired. Unable to name and repeat. Cranial nerves: Extraocular movements intact with no nystagmus. No facial asymmetry. Motor: moves all extremities symmetrically, at least anti-gravity x 4. Unable to test  finger to nose testing. Gait: she is wheelchair bound    Assessment and Plan:    Late onset Alzheimer's dementia with behavioral disturbance, severe Referral to Hospice for comfort care only Agree with discontinuation of cognitive medications.  Follow Up Instructions:    -I discussed the assessment and treatment plan with the patient. The patient was provided an opportunity to ask questions and all were answered. The patient agreed with the plan and demonstrated an understanding of the instructions.   The patient was advised to call back or seek an in-person evaluation if the symptoms worsen or if the condition fails to improve as anticipated.    Total time spent on today's visit was 24 minutes, including both face-to-face time and nonface-to-face time.  Time included that spent on review of records (prior notes available to me/labs/imaging if pertinent), discussing treatment and goals, answering patient's questions and coordinating care.   Sharene Butters, PA-C

## 2021-08-27 ENCOUNTER — Telehealth (INDEPENDENT_AMBULATORY_CARE_PROVIDER_SITE_OTHER): Payer: Medicare Other | Admitting: Physician Assistant

## 2021-08-27 ENCOUNTER — Other Ambulatory Visit: Payer: Self-pay

## 2021-08-27 DIAGNOSIS — R1013 Epigastric pain: Secondary | ICD-10-CM | POA: Diagnosis not present

## 2021-08-27 DIAGNOSIS — G301 Alzheimer's disease with late onset: Secondary | ICD-10-CM

## 2021-08-27 DIAGNOSIS — G20 Parkinson's disease: Secondary | ICD-10-CM | POA: Diagnosis not present

## 2021-08-27 DIAGNOSIS — S066X9D Traumatic subarachnoid hemorrhage with loss of consciousness of unspecified duration, subsequent encounter: Secondary | ICD-10-CM | POA: Diagnosis not present

## 2021-08-27 DIAGNOSIS — G309 Alzheimer's disease, unspecified: Secondary | ICD-10-CM | POA: Diagnosis not present

## 2021-08-27 DIAGNOSIS — G8929 Other chronic pain: Secondary | ICD-10-CM | POA: Diagnosis not present

## 2021-08-27 DIAGNOSIS — J449 Chronic obstructive pulmonary disease, unspecified: Secondary | ICD-10-CM | POA: Diagnosis not present

## 2021-08-27 DIAGNOSIS — Z9181 History of falling: Secondary | ICD-10-CM | POA: Diagnosis not present

## 2021-08-27 DIAGNOSIS — F0280 Dementia in other diseases classified elsewhere without behavioral disturbance: Secondary | ICD-10-CM | POA: Diagnosis not present

## 2021-08-27 DIAGNOSIS — R519 Headache, unspecified: Secondary | ICD-10-CM | POA: Diagnosis not present

## 2021-08-27 DIAGNOSIS — E039 Hypothyroidism, unspecified: Secondary | ICD-10-CM | POA: Diagnosis not present

## 2021-08-27 DIAGNOSIS — I10 Essential (primary) hypertension: Secondary | ICD-10-CM | POA: Diagnosis not present

## 2021-08-27 NOTE — Patient Instructions (Addendum)
Referral to Hospice for  end of life comfort care

## 2021-08-28 ENCOUNTER — Telehealth: Payer: Self-pay | Admitting: Family Medicine

## 2021-08-28 NOTE — Telephone Encounter (Signed)
Neurology at [facility] send a referral for hospice for the patient and [place] is calling to see if Dr. Etter Sjogren will continue to be her pcp through Hospice. Please advice.     Phone: [phone]

## 2021-08-28 NOTE — Telephone Encounter (Signed)
Yes

## 2021-08-29 NOTE — Telephone Encounter (Signed)
Verbal given 

## 2021-09-03 DIAGNOSIS — I10 Essential (primary) hypertension: Secondary | ICD-10-CM | POA: Diagnosis not present

## 2021-09-03 DIAGNOSIS — G301 Alzheimer's disease with late onset: Secondary | ICD-10-CM | POA: Diagnosis not present

## 2021-09-03 DIAGNOSIS — G20 Parkinson's disease: Secondary | ICD-10-CM | POA: Diagnosis not present

## 2021-09-03 DIAGNOSIS — J449 Chronic obstructive pulmonary disease, unspecified: Secondary | ICD-10-CM | POA: Diagnosis not present

## 2021-09-03 DIAGNOSIS — E039 Hypothyroidism, unspecified: Secondary | ICD-10-CM | POA: Diagnosis not present

## 2021-09-03 DIAGNOSIS — R1013 Epigastric pain: Secondary | ICD-10-CM | POA: Diagnosis not present

## 2021-09-03 DIAGNOSIS — G8929 Other chronic pain: Secondary | ICD-10-CM | POA: Diagnosis not present

## 2021-09-03 DIAGNOSIS — Z9181 History of falling: Secondary | ICD-10-CM | POA: Diagnosis not present

## 2021-09-03 DIAGNOSIS — R519 Headache, unspecified: Secondary | ICD-10-CM | POA: Diagnosis not present

## 2021-09-03 DIAGNOSIS — S066X9D Traumatic subarachnoid hemorrhage with loss of consciousness of unspecified duration, subsequent encounter: Secondary | ICD-10-CM | POA: Diagnosis not present

## 2021-09-07 DIAGNOSIS — E7800 Pure hypercholesterolemia, unspecified: Secondary | ICD-10-CM | POA: Diagnosis not present

## 2021-09-07 DIAGNOSIS — G301 Alzheimer's disease with late onset: Secondary | ICD-10-CM

## 2021-09-07 DIAGNOSIS — F02818 Dementia in other diseases classified elsewhere, unspecified severity, with other behavioral disturbance: Secondary | ICD-10-CM

## 2022-02-26 ENCOUNTER — Ambulatory Visit: Payer: Medicare Other | Admitting: Neurology

## 2022-05-09 DEATH — deceased

## 2023-03-21 IMAGING — CT CT MAXILLOFACIAL W/O CM
3 series · 16 of 47 positions shown, 19 images · non-contrast
Comparison: Head CT 04/08/2021

CLINICAL DATA: Fall

EXAM:
CT MAXILLOFACIAL WITHOUT CONTRAST
TECHNIQUE: Multidetector CT imaging of the maxillofacial structures was
performed. Multiplanar CT image reconstructions were also generated.

[Series 3: max soft · axial · 0.38mm/px · z∈[+1464,+1608]mm · 10 of 84 slices shown, 13 images]
[im 6/84  brain]
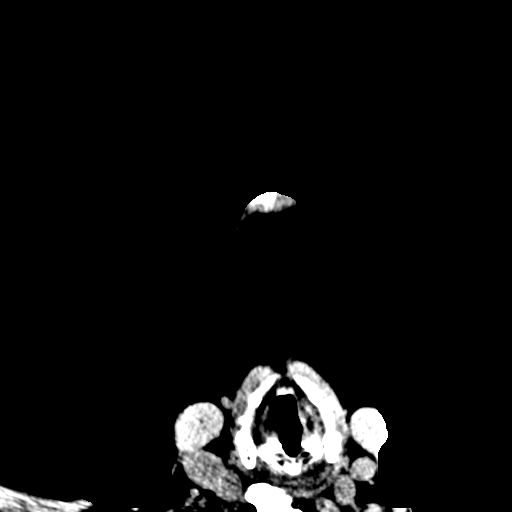
[im 6/84  bone]
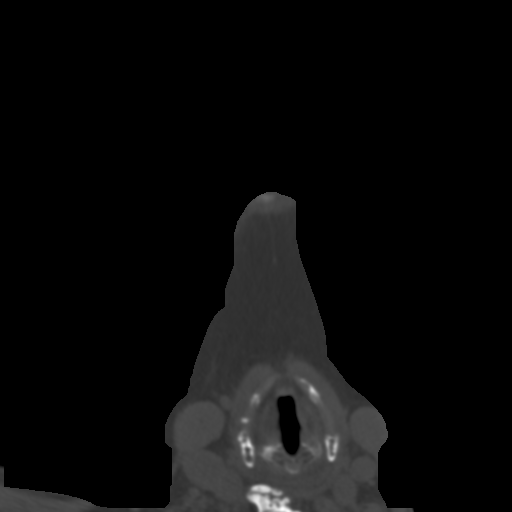
[im 15/84  bone]
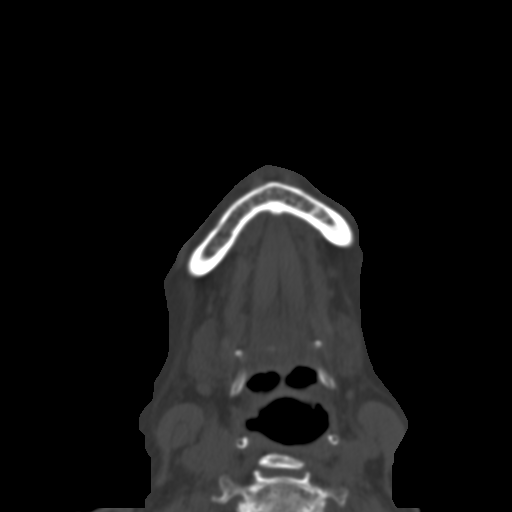
[im 23/84  bone]
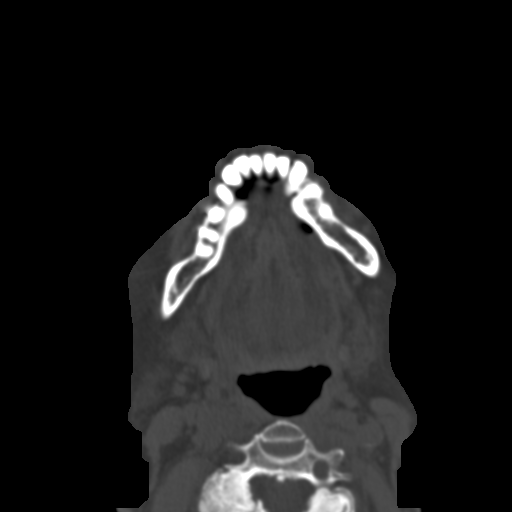
[im 29/84  bone]
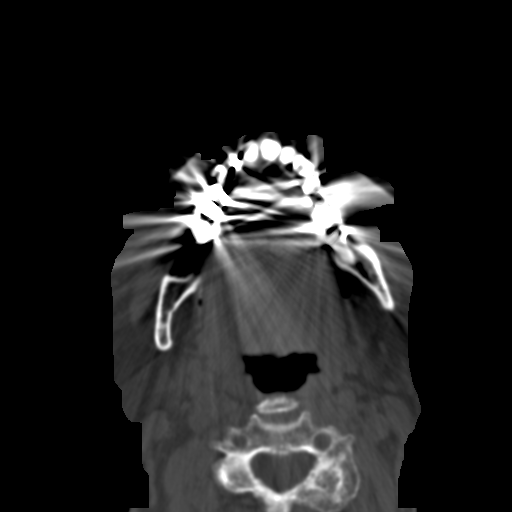
[im 38/84  brain]
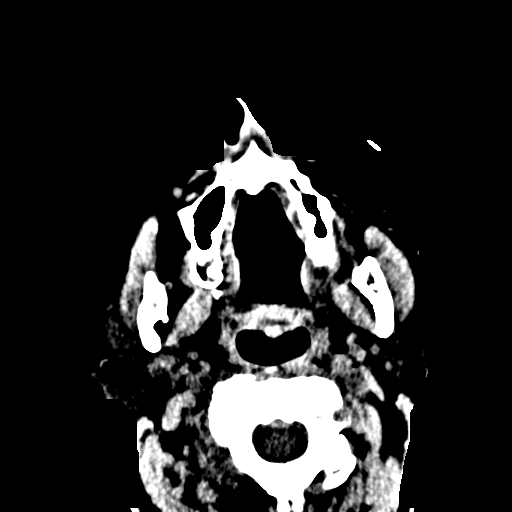
[im 38/84  bone]
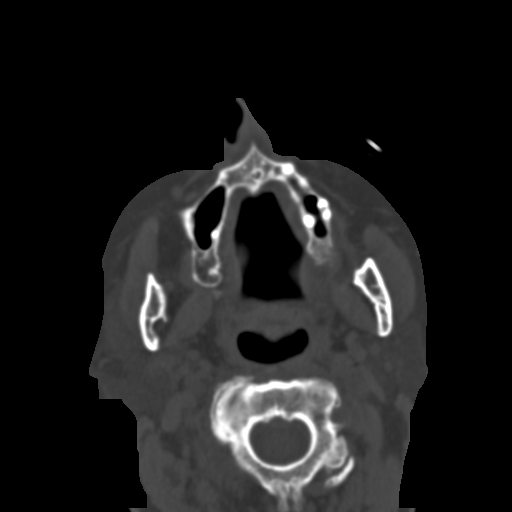
[im 46/84  bone]
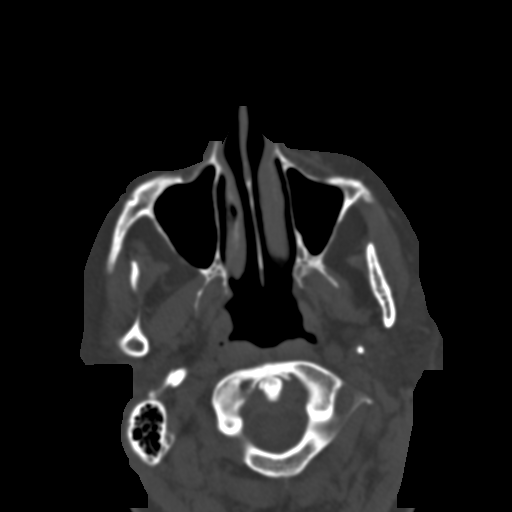
[im 55/84  bone]
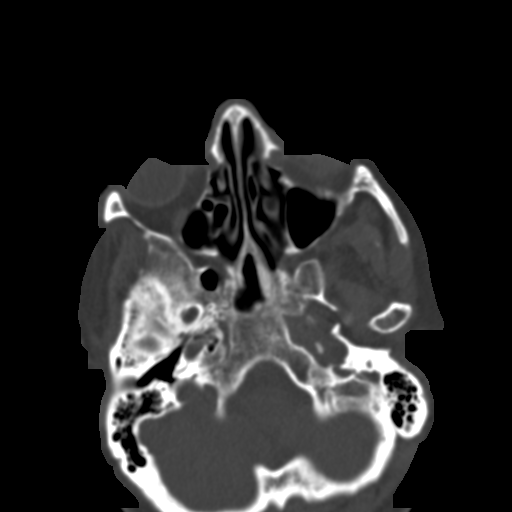
[im 63/84  bone]
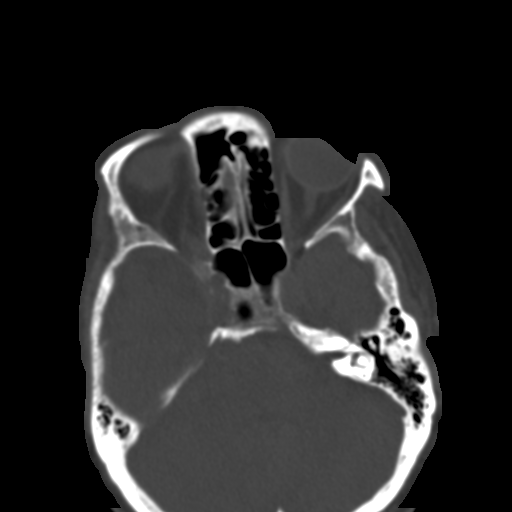
[im 69/84  brain]
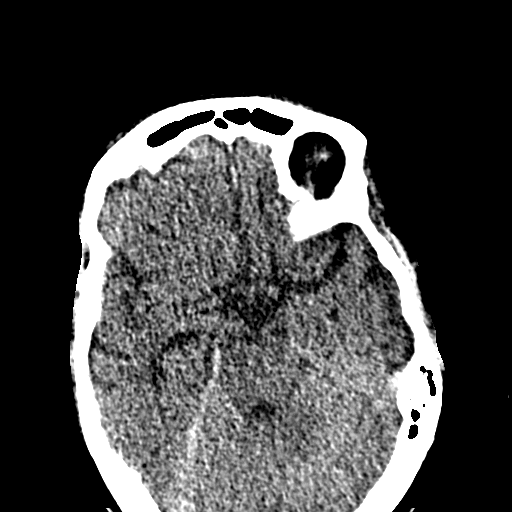
[im 69/84  bone]
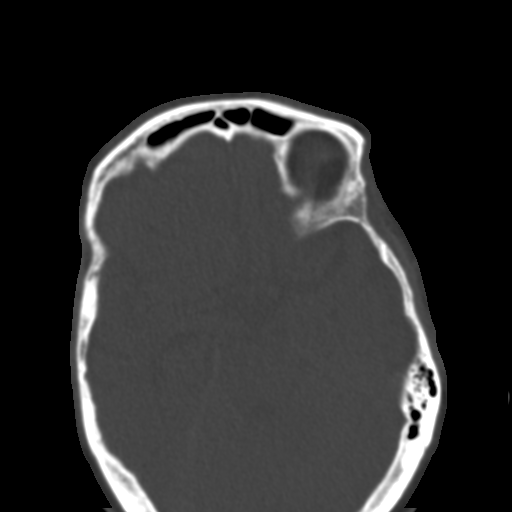
[im 78/84  bone]
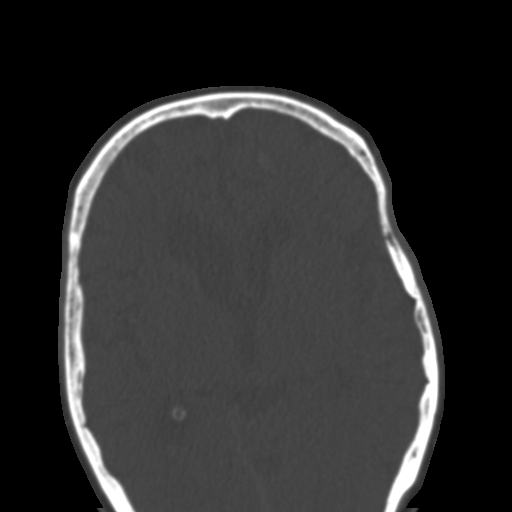

[Series 7: coronal soft · coronal · 0.37mm/px · 3 of 77 slices shown]
[im 26/77  bone]
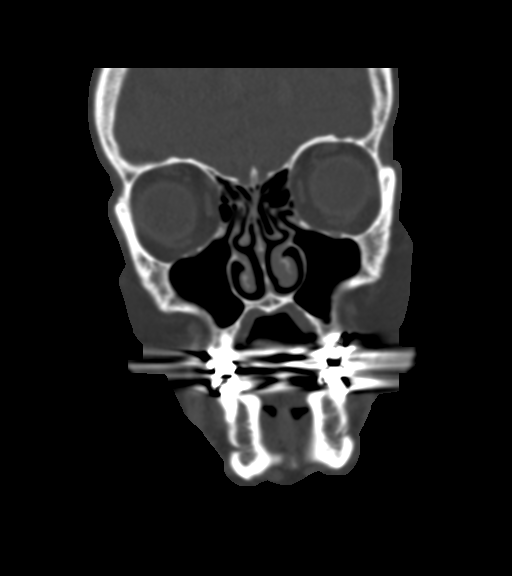
[im 34/77  bone]
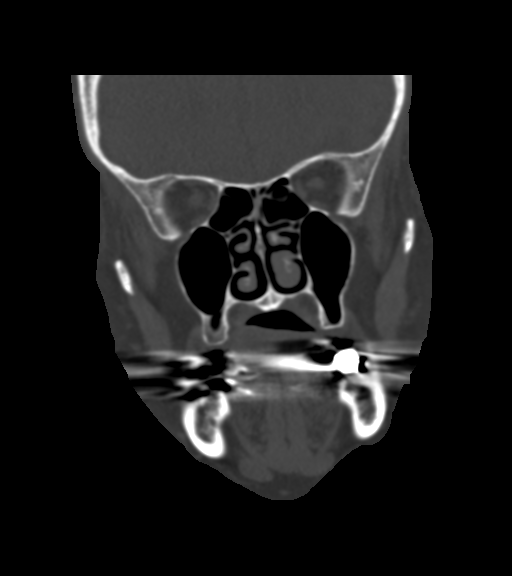
[im 43/77  bone]
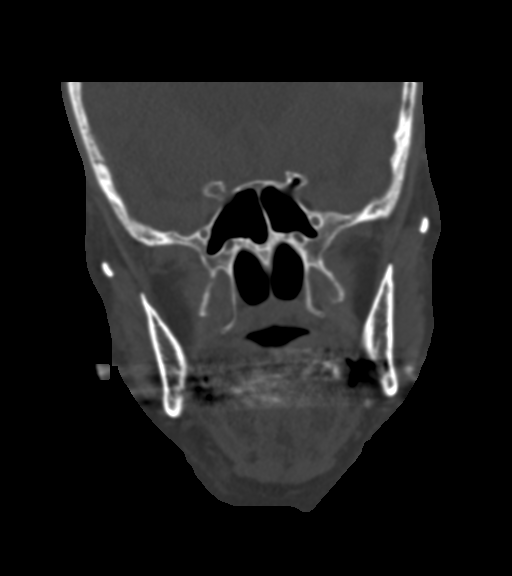

[Series 8: sagittal soft · sagittal · 0.35mm/px · 3 of 81 slices shown]
[im 27/81  bone]
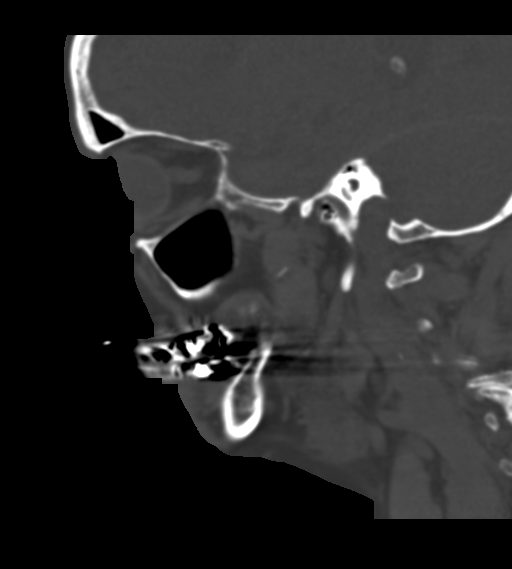
[im 41/81  bone]
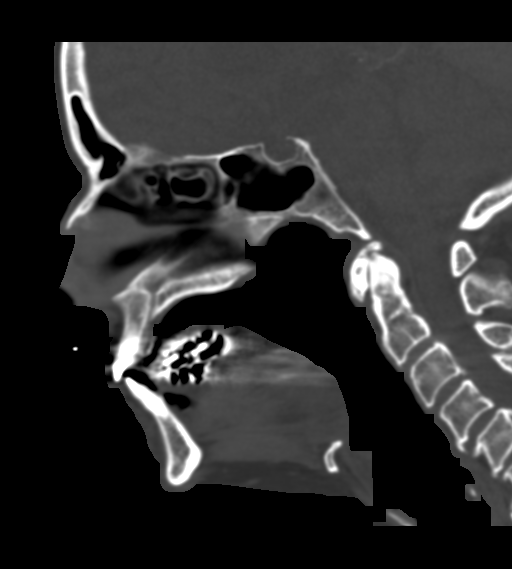
[im 54/81  bone]
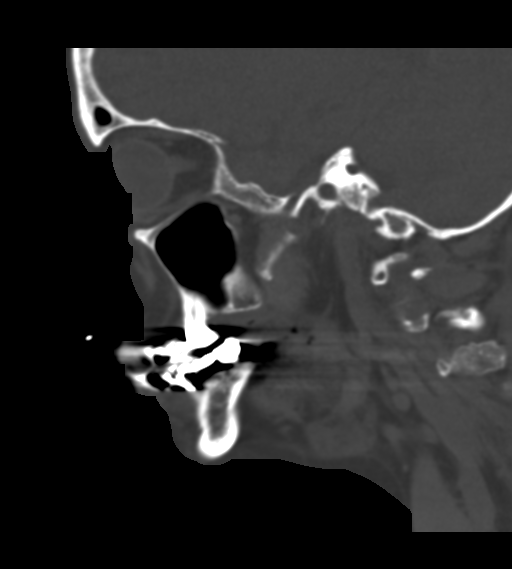

[16 of 47 positions shown; findings below may reference images not displayed]

FINDINGS: Osseous: No fracture or mandibular dislocation. No destructive
process.

Orbits: Negative. No traumatic or inflammatory finding.

Sinuses: Clear.

Soft tissues: Negative.

Limited intracranial: Unchanged pattern intracranial hemorrhage
IMPRESSION: 1. No facial fracture.
2. Unchanged pattern of intracranial hemorrhage.
